# Patient Record
Sex: Male | Born: 1945 | Race: White | Hispanic: No | Marital: Married | Smoking: Never smoker
Health system: Southern US, Community
[De-identification: ages and names within clinical notes are randomized; demographics above are authoritative.]

## PROBLEM LIST (undated history)

## (undated) DIAGNOSIS — I2510 Atherosclerotic heart disease of native coronary artery without angina pectoris: Secondary | ICD-10-CM

## (undated) DIAGNOSIS — R0600 Dyspnea, unspecified: Secondary | ICD-10-CM

## (undated) DIAGNOSIS — M1990 Unspecified osteoarthritis, unspecified site: Secondary | ICD-10-CM

## (undated) DIAGNOSIS — I7121 Aneurysm of the ascending aorta, without rupture: Secondary | ICD-10-CM

## (undated) DIAGNOSIS — C569 Malignant neoplasm of unspecified ovary: Secondary | ICD-10-CM

## (undated) DIAGNOSIS — C801 Malignant (primary) neoplasm, unspecified: Secondary | ICD-10-CM

## (undated) DIAGNOSIS — C61 Malignant neoplasm of prostate: Secondary | ICD-10-CM

## (undated) DIAGNOSIS — Z87442 Personal history of urinary calculi: Secondary | ICD-10-CM

## (undated) DIAGNOSIS — Z85528 Personal history of other malignant neoplasm of kidney: Secondary | ICD-10-CM

## (undated) DIAGNOSIS — I712 Thoracic aortic aneurysm, without rupture: Secondary | ICD-10-CM

## (undated) DIAGNOSIS — E739 Lactose intolerance, unspecified: Secondary | ICD-10-CM

## (undated) DIAGNOSIS — C4490 Unspecified malignant neoplasm of skin, unspecified: Secondary | ICD-10-CM

## (undated) DIAGNOSIS — N200 Calculus of kidney: Secondary | ICD-10-CM

## (undated) DIAGNOSIS — I447 Left bundle-branch block, unspecified: Secondary | ICD-10-CM

## (undated) DIAGNOSIS — I10 Essential (primary) hypertension: Secondary | ICD-10-CM

## (undated) DIAGNOSIS — K219 Gastro-esophageal reflux disease without esophagitis: Secondary | ICD-10-CM

## (undated) DIAGNOSIS — R011 Cardiac murmur, unspecified: Secondary | ICD-10-CM

## (undated) HISTORY — DX: Atherosclerotic heart disease of native coronary artery without angina pectoris: I25.10

## (undated) HISTORY — PX: JOINT REPLACEMENT: SHX530

## (undated) HISTORY — PX: TRIGGER FINGER RELEASE: SHX641

## (undated) HISTORY — DX: Dyspnea, unspecified: R06.00

## (undated) HISTORY — PX: BACK SURGERY: SHX140

## (undated) HISTORY — PX: KNEE ARTHROSCOPY: SHX127

## (undated) HISTORY — DX: Thoracic aortic aneurysm, without rupture: I71.2

## (undated) HISTORY — PX: BUNIONECTOMY: SHX129

## (undated) HISTORY — DX: Aneurysm of the ascending aorta, without rupture: I71.21

## (undated) HISTORY — PX: LIPOMA EXCISION: SHX5283

## (undated) HISTORY — PX: APPENDECTOMY: SHX54

## (undated) HISTORY — DX: Lactose intolerance, unspecified: E73.9

## (undated) HISTORY — DX: Essential (primary) hypertension: I10

## (undated) HISTORY — DX: Left bundle-branch block, unspecified: I44.7

## (undated) HISTORY — PX: OTHER SURGICAL HISTORY: SHX169

## (undated) HISTORY — PX: ELBOW SURGERY: SHX618

## (undated) HISTORY — PX: TONSILLECTOMY: SUR1361

## (undated) HISTORY — DX: Calculus of kidney: N20.0

## (undated) HISTORY — PX: TOTAL KNEE ARTHROPLASTY: SHX125

## (undated) HISTORY — PX: BELPHAROPTOSIS REPAIR: SHX369

---

## 1998-07-04 ENCOUNTER — Ambulatory Visit (HOSPITAL_COMMUNITY): Admission: RE | Admit: 1998-07-04 | Discharge: 1998-07-04 | Payer: Self-pay | Admitting: Gastroenterology

## 1999-11-14 ENCOUNTER — Inpatient Hospital Stay (HOSPITAL_COMMUNITY): Admission: AD | Admit: 1999-11-14 | Discharge: 1999-11-15 | Payer: Self-pay | Admitting: Neurosurgery

## 2000-02-13 ENCOUNTER — Encounter: Admission: RE | Admit: 2000-02-13 | Discharge: 2000-02-13 | Payer: Self-pay | Admitting: Neurosurgery

## 2000-02-22 ENCOUNTER — Encounter: Payer: Self-pay | Admitting: Urology

## 2000-02-22 ENCOUNTER — Ambulatory Visit (HOSPITAL_COMMUNITY): Admission: RE | Admit: 2000-02-22 | Discharge: 2000-02-22 | Payer: Self-pay | Admitting: Urology

## 2000-02-29 ENCOUNTER — Inpatient Hospital Stay (HOSPITAL_COMMUNITY): Admission: RE | Admit: 2000-02-29 | Discharge: 2000-03-03 | Payer: Self-pay | Admitting: Neurosurgery

## 2000-03-15 ENCOUNTER — Encounter: Admission: RE | Admit: 2000-03-15 | Discharge: 2000-03-15 | Payer: Self-pay | Admitting: Neurosurgery

## 2000-05-24 ENCOUNTER — Encounter: Admission: RE | Admit: 2000-05-24 | Discharge: 2000-05-24 | Payer: Self-pay | Admitting: Neurosurgery

## 2000-08-23 ENCOUNTER — Encounter: Admission: RE | Admit: 2000-08-23 | Discharge: 2000-08-23 | Payer: Self-pay | Admitting: Neurosurgery

## 2001-03-19 HISTORY — PX: LUMBAR SPINE SURGERY: SHX701

## 2003-10-02 ENCOUNTER — Emergency Department (HOSPITAL_COMMUNITY): Admission: EM | Admit: 2003-10-02 | Discharge: 2003-10-02 | Payer: Self-pay | Admitting: Emergency Medicine

## 2003-11-11 ENCOUNTER — Ambulatory Visit (HOSPITAL_COMMUNITY): Admission: RE | Admit: 2003-11-11 | Discharge: 2003-11-11 | Payer: Self-pay | Admitting: Urology

## 2003-11-12 ENCOUNTER — Emergency Department (HOSPITAL_COMMUNITY): Admission: EM | Admit: 2003-11-12 | Discharge: 2003-11-12 | Payer: Self-pay | Admitting: Emergency Medicine

## 2004-03-16 ENCOUNTER — Ambulatory Visit (HOSPITAL_COMMUNITY): Admission: RE | Admit: 2004-03-16 | Discharge: 2004-03-16 | Payer: Self-pay | Admitting: Urology

## 2005-12-13 ENCOUNTER — Ambulatory Visit (HOSPITAL_COMMUNITY): Admission: RE | Admit: 2005-12-13 | Discharge: 2005-12-13 | Payer: Self-pay | Admitting: Urology

## 2006-04-29 ENCOUNTER — Inpatient Hospital Stay (HOSPITAL_COMMUNITY): Admission: RE | Admit: 2006-04-29 | Discharge: 2006-05-01 | Payer: Self-pay | Admitting: Orthopedic Surgery

## 2006-05-13 ENCOUNTER — Encounter: Admission: RE | Admit: 2006-05-13 | Discharge: 2006-05-13 | Payer: Self-pay | Admitting: Orthopedic Surgery

## 2006-09-02 ENCOUNTER — Inpatient Hospital Stay (HOSPITAL_COMMUNITY): Admission: RE | Admit: 2006-09-02 | Discharge: 2006-09-04 | Payer: Self-pay | Admitting: Orthopedic Surgery

## 2006-09-10 ENCOUNTER — Emergency Department (HOSPITAL_COMMUNITY): Admission: EM | Admit: 2006-09-10 | Discharge: 2006-09-10 | Payer: Self-pay | Admitting: Emergency Medicine

## 2007-06-05 ENCOUNTER — Ambulatory Visit (HOSPITAL_COMMUNITY): Admission: RE | Admit: 2007-06-05 | Discharge: 2007-06-05 | Payer: Self-pay | Admitting: Urology

## 2007-11-13 ENCOUNTER — Ambulatory Visit (HOSPITAL_COMMUNITY): Admission: RE | Admit: 2007-11-13 | Discharge: 2007-11-13 | Payer: Self-pay | Admitting: Urology

## 2008-03-03 ENCOUNTER — Inpatient Hospital Stay (HOSPITAL_COMMUNITY): Admission: RE | Admit: 2008-03-03 | Discharge: 2008-03-06 | Payer: Self-pay | Admitting: Neurosurgery

## 2009-04-10 ENCOUNTER — Emergency Department (HOSPITAL_COMMUNITY): Admission: EM | Admit: 2009-04-10 | Discharge: 2009-04-10 | Payer: Self-pay | Admitting: Emergency Medicine

## 2009-04-20 ENCOUNTER — Ambulatory Visit (HOSPITAL_COMMUNITY): Admission: RE | Admit: 2009-04-20 | Discharge: 2009-04-20 | Payer: Self-pay | Admitting: Urology

## 2009-09-28 ENCOUNTER — Encounter: Payer: Self-pay | Admitting: Pulmonary Disease

## 2009-11-13 ENCOUNTER — Emergency Department (HOSPITAL_BASED_OUTPATIENT_CLINIC_OR_DEPARTMENT_OTHER): Admission: EM | Admit: 2009-11-13 | Discharge: 2009-11-13 | Payer: Self-pay | Admitting: Emergency Medicine

## 2009-12-16 ENCOUNTER — Ambulatory Visit: Payer: Self-pay | Admitting: Cardiology

## 2009-12-16 ENCOUNTER — Encounter: Admission: RE | Admit: 2009-12-16 | Discharge: 2009-12-16 | Payer: Self-pay | Admitting: Cardiology

## 2009-12-16 ENCOUNTER — Encounter: Payer: Self-pay | Admitting: Pulmonary Disease

## 2009-12-19 ENCOUNTER — Ambulatory Visit: Payer: Self-pay | Admitting: Cardiology

## 2009-12-22 ENCOUNTER — Inpatient Hospital Stay (HOSPITAL_BASED_OUTPATIENT_CLINIC_OR_DEPARTMENT_OTHER): Admission: RE | Admit: 2009-12-22 | Discharge: 2009-12-22 | Payer: Self-pay | Admitting: Cardiology

## 2009-12-22 ENCOUNTER — Ambulatory Visit: Payer: Self-pay | Admitting: Cardiology

## 2009-12-22 HISTORY — PX: CARDIAC CATHETERIZATION: SHX172

## 2009-12-28 ENCOUNTER — Ambulatory Visit: Payer: Self-pay | Admitting: Cardiovascular Disease

## 2009-12-28 ENCOUNTER — Encounter: Payer: Self-pay | Admitting: Pulmonary Disease

## 2010-01-03 ENCOUNTER — Encounter: Payer: Self-pay | Admitting: Pulmonary Disease

## 2010-01-03 ENCOUNTER — Ambulatory Visit: Payer: Self-pay | Admitting: Cardiology

## 2010-01-06 ENCOUNTER — Ambulatory Visit: Payer: Self-pay | Admitting: Cardiovascular Disease

## 2010-01-06 ENCOUNTER — Ambulatory Visit (HOSPITAL_COMMUNITY): Admission: RE | Admit: 2010-01-06 | Discharge: 2010-01-06 | Payer: Self-pay | Admitting: Cardiology

## 2010-01-06 ENCOUNTER — Encounter: Payer: Self-pay | Admitting: Cardiology

## 2010-01-06 ENCOUNTER — Ambulatory Visit: Payer: Self-pay

## 2010-01-06 ENCOUNTER — Encounter: Payer: Self-pay | Admitting: Pulmonary Disease

## 2010-01-31 DIAGNOSIS — I712 Thoracic aortic aneurysm, without rupture, unspecified: Secondary | ICD-10-CM | POA: Insufficient documentation

## 2010-01-31 DIAGNOSIS — E739 Lactose intolerance, unspecified: Secondary | ICD-10-CM | POA: Insufficient documentation

## 2010-01-31 DIAGNOSIS — I10 Essential (primary) hypertension: Secondary | ICD-10-CM | POA: Insufficient documentation

## 2010-01-31 DIAGNOSIS — K219 Gastro-esophageal reflux disease without esophagitis: Secondary | ICD-10-CM | POA: Insufficient documentation

## 2010-01-31 DIAGNOSIS — I447 Left bundle-branch block, unspecified: Secondary | ICD-10-CM | POA: Insufficient documentation

## 2010-02-01 ENCOUNTER — Ambulatory Visit: Payer: Self-pay | Admitting: Pulmonary Disease

## 2010-02-01 DIAGNOSIS — R0602 Shortness of breath: Secondary | ICD-10-CM | POA: Insufficient documentation

## 2010-02-14 ENCOUNTER — Ambulatory Visit: Payer: Self-pay | Admitting: Cardiology

## 2010-04-18 NOTE — Assessment & Plan Note (Signed)
Summary: sob/jd   Visit Type:  Initial Consult Copy to:  Dr. Peter Swaziland Primary Provider/Referring Provider:  Jarold Motto  CC:  Pulmonary consult for shortness of breath.  History of Present Illness: 65/M, never smoker for evaluation of dyspnea. He takes voltaran for joint pains & recently took tramadol. Developed sudden onset dyspnea while hitting balls on the range, lasting a few minutes, Was associated with chest discomfort & lightheadedness. Seemed to improve with resting. Not realted to other walking or sleeping. Blood pressure has been high but much better controlled now on amlodepin - olmesartan combination. He has a chronic LBBB & 4.5 cm ascending aortic aneurysm noted on CT chest. h/o recent travel to [place] & [place]. CT angio neg, BUN/ cr 31/1.6 Nuclear stress test nml, nml LVEF Cath - non obstructive CAD. No cough, wheezing or heartburn. He wonder sif symptoms related to meds - tramadol, toprol   Preventive Screening-Counseling & Management  Alcohol-Tobacco     Alcohol drinks/day: 0     Smoking Status: never  Current Medications (verified): 1)  Toprol Xl 50 Mg Xr24h-Tab (Metoprolol Succinate) .... 1/2 By Mouth Daily 2)  Azor 5-20 Mg Tabs (Amlodipine-Olmesartan) .Marland Kitchen.. 1 By Mouth Daily 3)  Advil 200 Mg Tabs (Ibuprofen) .... As Needed  Allergies (verified): 1)  ! 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  Smoking Status:  never Alcohol drinks/day:  0  Review of Systems       The patient complains of shortness of breath with activity and chest pain.  The patient denies shortness of breath at rest, productive cough, non-productive cough, coughing up blood, irregular heartbeats, acid heartburn, indigestion, loss of appetite, weight change, abdominal pain, difficulty swallowing, sore throat, tooth/dental problems, headaches, nasal congestion/difficulty breathing through nose, sneezing, itching, ear ache, anxiety, depression, hand/feet swelling, joint stiffness or pain, rash, change in color of mucus, and fever.    Vital Signs:  Patient profile:   65 year old male Height:      71.5 inches (181.61 cm) Weight:      204 pounds (92.73 kg) BMI:     28.16 O2 Sat:      97 % on Room air Temp:     98.2 degrees F (36.78 degrees C) oral Pulse rate:   64 / minute BP sitting:   126 / 82  (left arm) Cuff size:   regular  Vitals Entered By: Michel Bickers CMA (February 01, 2010 11:44 AM)  O2 Sat at Rest %:  97 O2 Flow:  Room air CC: Pulmonary consult for shortness of breath Is Patient Diabetic? No Comments Medications reviewed with patient Michel Bickers CMA  February 01, 2010 11:45 AM   Physical Exam  Additional Exam:  Gen. Pleasant, well-nourished, in no distress, normal affect ENT - no lesions, no post nasal drip Neck: No JVD, no thyromegaly, no carotid bruits Lungs: no use of accessory muscles, no dullness to percussion, clear without rales or rhonchi  Cardiovascular: Rhythm regular, heart sounds  normal, no murmurs or gallops, no peripheral edema Abdomen:  soft and non-tender, no hepatosplenomegaly, BS normal. Musculoskeletal: No deformities, no cyanosis or clubbing Neuro:  alert, non focal     Impression & Recommendations:  Problem # 1:  DYSPNEA (ICD-786.05)  He is much improved over last 2 weeks. Cardiac evalaution has  not shown clear cause. Doubt obstructive airways in this never smoker Ct angio neg for PE NO evidence of fibrosis on CT Doubt pulmonary function needed here. DOubt direct corelation with meds ? drug interaction He will call back for issues.  Orders: Consultation Level III (16109)  Medications Added to Medication List This Visit: 1)  Toprol Xl 50 Mg Xr24h-tab (Metoprolol succinate) .... 1/2 by mouth daily 2)  Azor 5-20 Mg Tabs (Amlodipine-olmesartan) .Marland Kitchen.. 1 by mouth daily 3)  Advil 200 Mg Tabs (Ibuprofen) .... As needed  Patient Instructions: 1)  Copy sent to: Dr Swaziland, Dr Jarold Motto 2)  Please schedule a follow-up appointment as needed. 3)  Your symptoms may be related to tramadol or blood pressure - doubt you have a primary lung problem

## 2010-04-18 NOTE — Letter (Signed)
Summary: [facility]   Imported By: Lester Ramah 02/08/2010 09:06:31  _____________________________________________________________________  External Attachment:    Type:   Image     Comment:   External Document

## 2010-04-21 NOTE — Letter (Signed)
Summary: [facility]   Imported By: Lester Conway 02/08/2010 09:04:50  _____________________________________________________________________  External Attachment:    Type:   Image     Comment:   External Document

## 2010-06-02 LAB — BASIC METABOLIC PANEL
BUN: 22 mg/dL (ref 6–23)
CO2: 21 mEq/L (ref 19–32)
Calcium: 9 mg/dL (ref 8.4–10.5)
Chloride: 109 mEq/L (ref 96–112)
Creatinine, Ser: 1.2 mg/dL (ref 0.4–1.5)
GFR calc Af Amer: >60 mL/min (ref >60)
GFR calc non Af Amer: >60 mL/min (ref >60)
Glucose, Bld: 119 mg/dL — ABNORMAL HIGH (ref 70–99)
Potassium: 4 mEq/L (ref 3.5–5.1)
Sodium: 140 mEq/L (ref 135–145)

## 2010-06-02 LAB — CBC
HCT: 46.3 % (ref 39.0–52.0)
Hemoglobin: 16.1 g/dL (ref 13.0–17.0)
MCH: 33.3 pg (ref 26.0–34.0)
MCHC: 34.8 g/dL (ref 30.0–36.0)
MCV: 93.5 fL (ref 78.0–100.0)
Platelets: 206 10*3/uL (ref 150–400)
RBC: 5.04 MIL/uL (ref 4.22–5.81)
RDW: 12.2 % (ref 11.5–15.5)
WBC: 5.1 10*3/uL (ref 4.0–10.5)

## 2010-06-02 LAB — DIFFERENTIAL
Basophils Absolute: 0 10*3/uL (ref 0.0–0.1)
Basophils Relative: 1 % (ref 0–1)
Eosinophils Absolute: 0.2 10*3/uL (ref 0.0–0.7)
Eosinophils Relative: 4 % (ref 0–5)
Lymphocytes Relative: 40 % (ref 12–46)
Lymphs Abs: 2 10*3/uL (ref 0.7–4.0)
Monocytes Absolute: 0.5 10*3/uL (ref 0.1–1.0)
Monocytes Relative: 10 % (ref 3–12)
Neutro Abs: 2.4 10*3/uL (ref 1.7–7.7)
Neutrophils Relative %: 46 % (ref 43–77)

## 2010-06-04 LAB — BASIC METABOLIC PANEL
BUN: 17 mg/dL (ref 6–23)
CO2: 24 mEq/L (ref 19–32)
Calcium: 9.3 mg/dL (ref 8.4–10.5)
Chloride: 108 mEq/L (ref 96–112)
Creatinine, Ser: 1.29 mg/dL (ref 0.4–1.5)
GFR calc Af Amer: >60 mL/min (ref >60)
GFR calc non Af Amer: 56 mL/min — ABNORMAL LOW (ref >60)
Glucose, Bld: 103 mg/dL — ABNORMAL HIGH (ref 70–99)
Potassium: 4.2 mEq/L (ref 3.5–5.1)
Sodium: 140 mEq/L (ref 135–145)

## 2010-06-04 LAB — URINE CULTURE: Colony Count: 4000

## 2010-06-04 LAB — CBC
HCT: 47 % (ref 39.0–52.0)
Hemoglobin: 16 g/dL (ref 13.0–17.0)
MCHC: 34.1 g/dL (ref 30.0–36.0)
MCV: 94.6 fL (ref 78.0–100.0)
Platelets: 197 10*3/uL (ref 150–400)
RBC: 4.97 MIL/uL (ref 4.22–5.81)
RDW: 12.4 % (ref 11.5–15.5)
WBC: 5.9 10*3/uL (ref 4.0–10.5)

## 2010-06-04 LAB — URINALYSIS, ROUTINE W REFLEX MICROSCOPIC
Bilirubin Urine: NEGATIVE
Glucose, UA: NEGATIVE mg/dL
Ketones, ur: NEGATIVE mg/dL
Nitrite: NEGATIVE
Protein, ur: 100 mg/dL — AB
Specific Gravity, Urine: 1.025 (ref 1.005–1.030)
Urobilinogen, UA: 0.2 mg/dL (ref 0.0–1.0)
pH: 5.5 (ref 5.0–8.0)

## 2010-06-04 LAB — DIFFERENTIAL
Basophils Absolute: 0 10*3/uL (ref 0.0–0.1)
Basophils Relative: 0 % (ref 0–1)
Eosinophils Absolute: 0.1 10*3/uL (ref 0.0–0.7)
Eosinophils Relative: 2 % (ref 0–5)
Lymphocytes Relative: 23 % (ref 12–46)
Lymphs Abs: 1.3 10*3/uL (ref 0.7–4.0)
Monocytes Absolute: 0.5 10*3/uL (ref 0.1–1.0)
Monocytes Relative: 8 % (ref 3–12)
Neutro Abs: 3.9 10*3/uL (ref 1.7–7.7)
Neutrophils Relative %: 67 % (ref 43–77)

## 2010-06-04 LAB — URINE MICROSCOPIC-ADD ON

## 2010-07-03 ENCOUNTER — Other Ambulatory Visit: Payer: Self-pay | Admitting: *Deleted

## 2010-07-03 DIAGNOSIS — I10 Essential (primary) hypertension: Secondary | ICD-10-CM

## 2010-07-03 MED ORDER — HYDROCHLOROTHIAZIDE 25 MG PO TABS: 25.0000 mg | ORAL_TABLET | Freq: Every day | ORAL | Status: DC

## 2010-07-03 NOTE — Telephone Encounter (Signed)
escribe medication per fax request  

## 2010-07-31 ENCOUNTER — Other Ambulatory Visit: Payer: Self-pay | Admitting: *Deleted

## 2010-07-31 DIAGNOSIS — I10 Essential (primary) hypertension: Secondary | ICD-10-CM

## 2010-07-31 MED ORDER — HYDROCHLOROTHIAZIDE 25 MG PO TABS: 25.0000 mg | ORAL_TABLET | Freq: Every day | ORAL | Status: DC

## 2010-07-31 NOTE — Telephone Encounter (Signed)
escribe medication per fax request  

## 2010-08-01 NOTE — Op Note (Signed)
NAME:  Corey [name] NO.:  [id number]   MEDICAL RECORD NO.:  [id number]          PATIENT TYPE:  INP   LOCATION:  5019                         FACILITY:  [facility]   PHYSICIAN:  Elana Alm. Thurston [name], M.D. DATE OF BIRTH:  1945/03/30   DATE OF PROCEDURE:  09/02/2006  DATE OF DISCHARGE:                               OPERATIVE REPORT   PREOPERATIVE DIAGNOSIS:  Right knee degenerative joint disease.   POSTOPERATIVE DIAGNOSIS:  Right knee degenerative joint disease.   PROCEDURE:  Right total knee replacement using DePuy cemented total knee  system with #4 cemented femur, #4 cemented tibia with a 15-mm  polyethylene rotating-platform tibial spacer, and a 38-mm polyethylene  cemented patella.   SURGEON:  Elana Alm. Thurston [name], M.D.   ASSISTANT:  Julien Girt, P.A.   ANESTHESIA:  General.   OPERATIVE TIME:  1 hour 20 minutes.   COMPLICATIONS:  None.   DESCRIPTION OF PROCEDURE:  Corey [name] was brought to the operating  room on September 02, 2006, after a femoral nerve block was placed in the  holding room by Anesthesia.  He was placed on the operating table in  supine position.  His right knee was examined under anesthesia.  He had  a range of motion from -5 to 125 degrees, with mild varus deformity, and  knee stable to ligamentous exam with normal patellar tracking.  He had a  Foley catheter placed under sterile conditions.  He received Ancef 2 g  IV preoperatively for prophylaxis.  His right leg was then prepped using  sterile DuraPrep and draped using sterile technique.  The leg was  exsanguinated and a thigh tourniquet elevated 365 mm.  Initially,  through a 15-cm longitudinal incision based over the patella, initial  exposure was made.  The underlying subcutaneous tissues were incised in  line with the skin incision.  A median arthrotomy was performed,  revealing an excessive amount of normal-appearing joint fluid.  The  articular surfaces were inspected.  He had  grade 4 changes medially,  grade 3 changes laterally and grade 3 and 4 changes in the  patellofemoral joint.  Osteophytes were removed from the femoral  condyles and tibial plateau.  The medial and lateral meniscal remnants  were removed, as well as the anterior cruciate ligament.  An  intramedullary drill was then drilled up the femoral canal for placement  of the distal femoral cutting jig, which was placed in the appropriate  amount of rotation, and the distal 11-mm cut was made.  The distal femur  was then incised.  A #4 was found to be the appropriate size.  A #4  cutting jig was placed, and then these cuts were made.  The proximal  tibia was then exposed.  The tibial spines were removed with an  oscillating saw.  An intramedullary drill was drilled down the tibial  canal for placement of the proximal tibial cutting jig, which was placed  in the appropriate amount of rotation.  The proximal 6-mm cut was made,  based off the medial or lower side.  Spacer blocks were then placed in  flexion and extension, and 15-mm blocks gave excellent balancing,  excellent stability and excellent correction of his flexion and varus  deformity.  After this was done, the #4 tibial baseplate trial was  placed on the cut tibial surface with an excellent fit, and the keel cut  was made.  A #4 PCL box cutter was placed on the distal femur, and then  these cuts were made.  At this point, the #4 femoral trial was placed,  and with the #4 tibial baseplate trial and a 15-mm poly RP tibial  spacer, the knee was reduced and taken through a range of motion from 0  to 125 degrees with excellent stability and excellent correction of his  flexion and varus deformities.  The patella was incised.  A resurfacing  10-mm cut was made and 3 locking holes placed for a 38-mm patella.  The  patellar trial was placed.  Patellofemoral tracking was evaluated and  this was found to be normal.  At this point, it was felt that all  the  trial components were of excellent size, fit and stability.  They were  then removed.  The knee was then jet lavaged and irrigated with 3 L of  saline.  The proximal tibia was then exposed.  A #4 tibial baseplate  with cement backing was hammered in position with an excellent fit, with  excess cement being removed from around the edges.  A #4 femoral  component with cement backing was hammered in position, also with an  excellent fit, with excess cement being removed from the edges.  The 15-  mm polyethylene RP tibial spacer was placed on the tibial baseplate and  the knee reduced and taken through a range of motion from 0 to 125  degrees, with excellent stability and excellent correction of his  flexion and varus deformities.  A 38-mm polyethylene cement-backed  patella was then placed in its position and held there with a clamp.  After the cement hardened, patellofemoral tracking was again evaluated  and found to be normal.  At this point, it was felt that all the  components were of excellent size, fit and stability.  The wound was  further irrigated with saline.  The tourniquet was released.  Hemostasis  was obtained with cautery.  The arthrotomy was then closed with #1  Ethibond suture over 2 medium Hemovac drains.  The subcutaneous tissues  were closed with 0 and 2-0 Vicryl.  The subcuticular layer was closed  with 4-0 Monocryl.  Sterile dressings and a long leg splint were  applied.  The patient was then awakened, extubated and taken to the  recovery room in stable condition.  Needle and sponge counts were  correct x2 at the end of the case.  Neurovascular status was normal.      Corey [name], M.D.  Electronically Signed     RAW/MEDQ  D:  09/02/2006  T:  09/02/2006  Job:  [id number]

## 2010-08-01 NOTE — Op Note (Signed)
NAME:  Corey [name], Corey [name] NO.:  [id number]   MEDICAL RECORD NO.:  [id number]          PATIENT TYPE:  INP   LOCATION:  3040                         FACILITY:  [facility]   PHYSICIAN:  Cristi Loron, M.D.DATE OF BIRTH:  [birth year]   DATE OF PROCEDURE:  03/03/2008  DATE OF DISCHARGE:                               OPERATIVE REPORT   BRIEF HISTORY:  The patient is a 65 year old white male who I performed  a L5-S1 decompression and fusion many years ago.  The patient has done  well until recently where he has developed increasing back and hip pain  consistent with neurogenic claudication.  I discussed the situation with  the patient who was worked up with a lumbar MRI, which demonstrated he  had developed severe degeneration as well as foraminal stenosis at L4-5.  I discussed the various treatment options with the patient including  surgery.  He has weighed the risks, benefits, and alternatives of  procedure and decided to proceed with an exploration of his L5-S1 fusion  with L4-5 decompression, instrumentation, and fusion.   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   PROCEDURES:  Exploration of L5-S1 fusion; bilateral L4 laminotomies,  foraminotomies to decompress the bilateral L4 as well as L5 nerve roots;  L4-5 posterior lumbar interbody fusion with local autograft bone and  Actifuse/Vitoss bone graft extender; insertion of bilateral L4-5  interbody prosthesis (Capstone PEEK interbody prosthesis); L4 through S1  posterior segmental fixation with SD-90 titanium pedicle screws and  rods; posterolateral arthrodesis L4-5 with local morselized autograft  bone, Actifuse, and Vitoss bone graft extenders.   SURGEON:  Cristi Loron, MD   ASSISTANT:  Stefani Dama, MD   ANESTHESIA:  General endotracheal.   ESTIMATED BLOOD LOSS:  500 mL.   SPECIMENS:  None.   DRAINS:  None.   COMPLICATIONS:  Small unintentional durotomy.   DESCRIPTION OF PROCEDURE:  The patient was brought to the operating room  by the anesthesia team.  General endotracheal anesthesia was induced.  The patient was carefully turned to the prone position onto the Motley  frame.  His lumbosacral region was then shaved with clippers and  prepared with Betadine scrub and Betadine solution and sterile drapes  were applied.  I then injected the area to be incised with Marcaine with  epinephrine solution.  I then used a scalpel to make an incision in the  patient's preexisting surgical scar and extended it somewhat in cephalad  direction.  I used electrocautery to perform a bilateral subperiosteal  dissection, exposing the spinous processes, lamina of L3, L4, and L5 in  the upper sacrum.  We exposed the old pedicle screws and rods at L5-S1.  I then removed the caps from the old SD-90 pedicle screws.  I then  attempted to independently move the pedicle screws.  I explored the  fusion.  I did not see any motion and it appeared that the L5-S1 was  well fused.  Having completed the exploration of the arthrodesis, we now turned our  attention to the decompression.  I should mention we inserted the Versa-  Trac retractor for exposure.  I then used a high-speed drill to perform  bilateral L4 laminotomies.  I widened the laminotomies with Kerrison  punch.  Because of the patient's severe facet arthropathy and lateral  recess/foraminal stenosis, a greater decompression was required than  what would have simply been required to do the interbody fusion.  I  performed a medial facetectomy and wide foraminotomies about the left L4  and L5 nerve roots using the Kerrison punches.  In the process of doing  this, we did create a very small durotomy.  There was no CSF leakage,  but we could see a bit of  arachnoid protruding.  It was so small that it  did not require stitch.  We simply at the end of the case put some  Tisseel over it.   Having completed decompression L4-5, we now turned our attention to  arthrodesis.  We incised the L4-5 intervertebral disk bilaterally with a  #15 blade scalpel.  The disk space was quite collapsed and spondylotic.  We used the Epstein curettes and the pituitary forceps to perform a  partial intervertebral diskectomy.  We then prepared the vertebral  endplates for fusion by using the curettes to remove the soft tissues  and we used trial spacers and determined to use a 18 x 26 mm Capstone  PEEK interbody prosthesis bilaterally.  We then prefilled this  prosthesis with a combination of local autograft bone we obtained during  decompression as well as Actifuse and Vitoss bone graft extender.  We  then inserted the prosthesis into the interspace, of course, after  retracting the neural structures out of the harms way.  We also filled  in laterally and medial to the interbody prosthesis with local  autograft, Actifuse, and Vitoss bone graft extender.  This completed the  posterior lumbar interbody fusion.   We now turned our attention to the instrumentation.  Under fluoroscopic  guidance, we cannulated the bilateral L4 pedicles with a ball probe.  We  then tapped the pedicle with a 6.25-mm tap, then probed inside the  tapped pedicle with a ball probe to rule out cortical breeches.  I then  inserted 7.75 x 50 mm pedicle screws bilaterally into the L4 pedicles  under fluoroscopic guidance.  We palpated along the medial aspect of the  pedicles around cortical breeches and noted there were none.  We then  connected unilateral pedicle screws with a 60-mm rod which we contoured  to appropriate lordosis.  We then secured the rod in place by tightening  the caps bilaterally.  This completed the instrumentation from L4  through S1.   We now turned our attention to  posterolateral arthrodesis.  We used a  high-speed drill to decorticate the remainder of the L4-5 facet, pars  region, and transverse processes.  We then laid a combination of local  autograft bone, Vitoss, and Actifuse bone graft extenders over these  decorticated posterior structures bilaterally completing the  posterolateral arthrodesis.   We then obtained hemostasis using bipolar electrocautery.  I should  mention that we encountered a good bed of epidural bleeding, which we  controlled with bipolar electrocautery and Gelfoam.  We then palpated on  the ventral surface of thecal sac along the exit route of the L4 and L5  nerve roots bilaterally in order to well decompress.  We  irrigated the  wound out with bacitracin solution, removed the retractors, and then  reapproximated the patient's thoracolumbar fascia with interrupted #1  Vicryl suture, subcutaneous tissue with interrupted 2-0 Vicryl suture,  and the skin with Steri-Strips and Benzoin.  The wound was then coated  with bacitracin ointment.  A sterile dressing was  applied.  The drapes were removed, and the patient was subsequently  returned to supine position where he was extubated by the anesthesia  team and transported to the postanesthesia care unit in stable  condition.  All sponge, instrument, and needle counts were correct at  the end of this case.      Cristi Loron, M.D.  Electronically Signed     JDJ/MEDQ  D:  03/03/2008  T:  03/04/2008  Job:  [id number]

## 2010-08-04 NOTE — Discharge Summary (Signed)
[place]. [facility]  Patient:    Corey [name], Corey [name]                      MRN: [id number] Adm. Date:  [id number] Disc. Date: [id number] Attending:  Tressie Stalker D                           Discharge Summary  For details of this admission, please refer to typed history and physical.  BRIEF HISTORY:  The patient is a 65 year old white male who has had a long-term diagnosis of spondylolisthesis.  He began complaining of significant left leg pain and numbness more recently.  He failed medical management and was worked up with a lumbar MRI which demonstrated a grade I/II spondylolisthesis at L5-S1 with lateral recess stenosis on the left at L5-S1. The patients findings on physical exam was consistent with left L5 radiculopathy.  I discussed various treatments optionswith him including steroid injections and surgery.  I discussed a laminotomy and foraminotomy versus that operation plus a spinal fusion.  The patient elected to have the former surgery done after weighing the risks, benefits, and alternatives to surgery.  PAST MEDICAL HISTORY, PASTSURGICAL HISTORY, MEDICATIONS PRIOR TO ADMISSION, DRUG ALLERGIES, FAMILYMEDICAL HISTORY, SOCIAL HISTORY, REVIEW OF SYSTEMS, ADMISSION PHYSICAL EXAMINATION, ASSESSMENT/PLAN, ETC.:  Please refer to typed history and physical.  HOSPITAL COURSE:  I performed a left L5 hemilaminectomy and a left L4 laminotomy and foraminotomy on the patient on November 13, 1999.  The surgery went well without complications.  (For full details of this operation, please refer to typed operative note.)  POSTOPERATIVE COURSE:  The patients postoperative course was initially remarkable of complaints of significant left leg pain to the point where he was requiring intravenous analgesics.  This resolved by November 15, 1999.  At that time, his leg felt better, he was afebrile, vital signs stable.  He was healingwell without signs of infection.  He had  normal motor strength.  I, therefore, discharged him home on November 15, 1999.  DISCHARGE INSTRUCTIONS:  The patient was given written discharge instructions.  FOLLOW UP:  He was instructed to follow up with me in three weeks.  DISCHARGE MEDICATIONS: 1. Percocet 5, #60, 1 to 2 p.o. q.4h. p.r.n. pain. 2. Valium 5 mg, #40, 1 p.o. q.6h. p.r.n. for muscle spasms, 1 refill.  FINAL DIAGNOSES:  L5-S1 spondylolithesis.  PROCEDURE PERFORMED:  Left L5 hemilaminectomy, left L4 laminotomy and foraminotomy. DD:  11/23/99 TD:  11/24/99 Job: [id number]

## 2010-08-04 NOTE — H&P (Signed)
[place]. [facility]  Patient:    Corey [name], Corey [name]                      MRN: [id number] Adm. Date:  [id number] Attending:  Tressie Stalker D                         History and Physical  CHIEF COMPLAINT:  Back pain, bilateral leg pain.  HISTORY OF PRESENT ILLNESS:  The patient is a 65 year old white male who I performed a left L5 hemilaminotomy and L4 laminotomy for treatment of spinal stenosis and spondylolithesis.  At this point, he was having predominately leg pain and actually did quite well from this regard; i.e., his left leg pain resolved.  Then he began to have some more back pain and particularly more pain in his right leg.  He failed medical management and I therefore discussed treatment options with him, including a L5-S1 Gill procedure with a posterior lumbar interbody fusion and placement of pedicle screws and rods.  I discussed the surgery extensively with him.  The patient weighed the risks, benefits, alternatives of surgery and decided to proceed with the operation after he failed medical management.  PAST MEDICAL HISTORY:  Positive for remote history of appendicitis, knee osteoarthritis.  He has a known history of nephrolithiasis and in fact had a bout of kidney stones last week.  MEDICATIONS: 1. Prilosec one p.o. q.d. 2. P.r.n. analgesics.  FAMILY MEDICAL HISTORY:  Patients mother is age 29 in good health.  Patients father died age 46 secondary to pancreatic cancer.  He had a brother who died age 26 secondary to a myocardial infarction.  SOCIAL HISTORY:  The patient is married, he has four children, lives in [place].  He is a Scientist, research (medical).  He denies tobacco, ethanol, or drug use.  REVIEW OF SYSTEMS:  Negative except as above.  PHYSICAL EXAMINATION:  GENERAL:  A pleasant, well-nourished, well-developed 65 year old white male. Height 5 feet 11 inches, weight 180 pounds.  HEENT:  Normocephalic, atraumatic.  Pupils  are equal, round and reactive to light.  NECK:  Normal.  THORAX:  Symmetric.  LUNGS:  Clear to auscultation.  HEART:  Regular rate and rhythm.  ABDOMEN:  Soft, nontender.  NEUROLOGIC:  The patient is alert and oriented x 3.  Cranial nerves 2-12 are grossly intact bilaterally.  Motor strength is grossly normal in all tested muscle groups of all four extremities.  Cerebellar exam is normal.  Sensory exam is normal.  Deep tendon reflexes are 2/4 in bilateral biceps, triceps, brachioradialis, quadriceps, gastrocnemius.  IMAGING STUDIES:  The patient has a lumbar MRI performed at [facility] on September 08, 1999 which demonstrates a L5-S1 grade 2 lytic spondylolisthesis with bilateral L5 spondylolysis.  ASSESSMENT AND PLAN:  L5-S1 grade 2 spondylolisthesis, bilateral L5 spondylolysis, spinal stenosis, lumbago, lumbar radiculopathy.  I have discussed the situation with the patient.  He has failed medical management and I have discussed surgical options with him including a lumbar decompression and fusion.  I have described an L5 Gill procedure with L5-S1 ______ placement with pedicle screws and rods and allograft bone, as well as a posterolateral fusion and the patient weighed the risks, benefits, alternatives of surgery and decided to proceed with the operation. DD:  02/29/00 TD:  03/01/00 Job: [id number]

## 2010-08-04 NOTE — H&P (Signed)
[place]. [facility]  Patient:    Corey [name], Corey [name]                      MRN: [id number] Adm. Date:  [id number] Attending:  Tressie Stalker D                         History and Physical  CHIEF COMPLAINT: Left leg pain.  HISTORY OF PRESENT ILLNESS: The patient is a 65 year old white male diagnosed with an L5-S1 spondylolisthesis many years ago.  He began having trouble a few months ago with tingling in his left leg that progressed to left leg pain. The patient has been seen by a chiropractor and his primary physician, and Dr. Farris Has.  He has been treated with medications, therapy, rest, etc., and has not improved.  He was referred to me.  The patient complains of left leg pain and tingling which seems to worsen if he ambulates.  He occasionally gets some right leg pain.  PAST MEDICAL HISTORY:  1. Remote history of appendicitis.  2. Knee osteoarthritis.  CURRENT MEDICATIONS:  1. Prilosec.  2. Vioxx.  ALLERGIES: No known drug allergies.  FAMILY HISTORY: The patients mother is age 69 and in good health.  The patients father died at age 44 secondary to pancreatic cancer.  A brother died at age 33 secondary to myocardial infarction.  SOCIAL HISTORY: The patient is married and has four children.  He lives in [place], [place] and is a Transport planner for [employer].  He denies tobacco, ethanol, and drug use.  REVIEW OF SYSTEMS: Negative except as above.  PHYSICAL EXAMINATION:  GENERAL: Pleasant, well-developed, well-nourished 65 year old white male in no apparent distress.  VITAL SIGNS: Height 5 feet 11 inches.  Weight 180 pounds.  HEENT: Head normocephalic, atraumatic.  PERRL.  EOMI.  Sclerae white. Conjunctivae pink.  Oropharynx benign.  Uvula midline.  He was wearing contact lenses.  NECK: Supple with no abnormalities.  Thorax symmetric.  LUNGS: Clear to auscultation.  HEART: Regular rate and rhythm.  ABDOMEN: Soft, nontender.   Well-healed laparotomy scar.  BACK: Benign.  No point tenderness or deformities except for a small palpable step-off.  Straight leg raise testing and Pearlean Brownie testing is negative bilaterally.  EXTREMITIES: Well-healed left foot incision without signs of infection.  NEUROLOGIC: The patient is alert and oriented x 3.  Cranial nerves 2-12 grossly intact bilaterally.  Vision grossly normal bilaterally with corrective lenses and hearing grossly normal bilaterally.  Motor strength is 5/5 at bilateral deltoids, biceps, triceps, hand grips, wrist extensors, interosseous psoas, quadriceps, gastrocnemius, extensor hallucis longus.  Cerebellar examination is intact with rapid alternating movements of the upper extremities bilaterally.  Sensory examination is normal to light touch and pinprick sensation in all tested dermatomes bilaterally.  Deep tendon reflexes are 2/4 in the bilateral biceps, triceps, brachial radialis, quadriceps, gastrocnemius.  IMAGING STUDIES: The patient had a lumbar MRI performed at [facility] on September 08, 1999 which demonstrates a lytic spondylolisthesis at L5-S1 and on axial images there is bilateral L5 spondylosis with grade 1/2 spondylolisthesis at L5-S1 with resulting lateral recess stenosis, and he has a pseudo disk herniation but I do not see any focal disk rupture.  ASSESSMENT/PLAN: Bilateral L5-S1 spondylosis, grade 1/2 spondylolisthesis of L5-S1, spinal stenosis and lumbar spondylosis with lumbar radiculopathy.  I have discussed the situation with the patient and reviewed his MRI scan and plain x-rays with him and pointed out  the abnormalities.  The patient has failed medical management and I discussed the surgical options with him including L5-S1 Gills procedure with posterior lumbar interbody fusion using pedicle screws and bone grafts versus a left L5 laminotomy versus hemilaminectomy for nerve root compression without fusion.  The  patient does have significant chronic back pain.  I discussed the surgical procedure and its risks extensively and the patient weighed the risks and benefits and alternatives to the various surgeries and decided to proceed with left L5 laminotomy versus laminectomy for nerve root decompression. DD:  11/13/99 TD:  11/13/99 Job: [id number]

## 2010-08-04 NOTE — Discharge Summary (Signed)
NAME:  Corey [name], Corey [name] NO.:  [id number]   MEDICAL RECORD NO.:  [id number]          PATIENT TYPE:  INP   LOCATION:  3040                         FACILITY:  [facility]   PHYSICIAN:  Cristi Loron, M.D.DATE OF BIRTH:  Dec 28, 1945   DATE OF ADMISSION:  03/03/2008  DATE OF DISCHARGE:  03/06/2008                               DISCHARGE SUMMARY   BRIEF HISTORY:  The patient is a 65 year old white male who I performed  an L5-S1 decompression and fusion many years ago.  The patient has done  well until recently when he has developed increasing back and hip pain  consistent with neurogenic claudication.  The patient was worked up with  a lumbar MRI, which demonstrated he had severe degeneration and  foraminal stenosis L4-5.  I discussed the various treatment options with  the patient including surgery.  The patient is aware of the risks,  benefits, and alternatives to surgery and decided to proceed with an  exploration of L5-S1 fusion with L4-5 decompression, instrumentation,  and fusion.   For further details of this admission, please refer to typed history and  physical.   HOSPITAL COURSE:  I admitted the patient to [facility] on  March 03, 2008.  On the day of admission, I explored his L5-S1 fusion  and performed a L4-5 decompression, instrumentation, and fusion.  The  surgery went well (for full details of this operation, please refer to  typed operative note).   POSTOPERATIVE COURSE:  The patient's postoperative course was  unremarkable and he was discharged to home on postoperative day #3.   DISCHARGE PRESCRIPTIONS:  The patient was given written discharge  prescriptions and instructed to follow up with me in 4 weeks.   FINAL DIAGNOSES:  L4-5 degenerative disease with spondylosis, stenosis,  lumbago, and lumbar radiculopathy.   PROCEDURE PERFORMED:  Exploration of L5-S1 fusion; bilateral L4  laminotomies, foraminotomies to decompress the bilateral L4  as well as  L5 nerve roots; L4-5 posterior lumbar interbody fusion with local  autograft bone and Actifuse/Vitoss bone graft extenders; insertion of  bilateral L4-5 interbody prosthesis ( PEEK interbody prosthesis); L4-S1  posterior segmental instrumentation with ST90 titanium screws and rods;  posterolateral arthrodesis L4-5 with locally morselized autograft,  Actifuse and Vitoss bone graft extenders.      Cristi Loron, M.D.  Electronically Signed     JDJ/MEDQ  D:  04/15/2008  T:  04/16/2008  Job:  [id number]

## 2010-08-04 NOTE — Op Note (Signed)
NAME:  Corey [name], Corey [name] NO.:  [id number]   MEDICAL RECORD NO.:  [id number]          PATIENT TYPE:  INP   LOCATION:  2550                         FACILITY:  [facility]   PHYSICIAN:  Elana Alm. Thurston Hole, M.D. DATE OF BIRTH:  Oct 11, 1945   DATE OF PROCEDURE:  04/29/2006  DATE OF DISCHARGE:                               OPERATIVE REPORT   PREOPERATIVE DIAGNOSIS:  1. Left knee degenerative joint disease.  2. Right knee degenerative joint disease.   POSTOPERATIVE DIAGNOSIS:  1. Left knee degenerative joint disease.  2. Right knee degenerative joint disease.   PROCEDURE:  1. Left total knee replacement using DePuy cemented total knee system      with #4 cemented femur, #4 cemented tibia with 15 mm polyethylene      RP tibial spacer and 38 mm polyethylene cemented patella.  2. Left total knee computer assisted navigation.  3. Right knee cortisone injection.   SURGEON:  Elana Alm. Thurston Hole, M.D.   ASSISTANT:  Julien Girt, P.A.   ANESTHESIA:  General.   OPERATIVE TIME:  1 hour 45 minutes.   COMPLICATIONS:  None.   DESCRIPTION OF PROCEDURE:  Corey [name] is brought to operating room on  04/29/2006, placed operative table supine position.  He received Ancef 1  g IV preoperatively for prophylaxis.  After being placed under general  anesthesia, both knees were examined.  Range of motion from 8 to 125  degrees with significant varus deformity bilaterally.  Both knee stable  ligamentous exam with normal patellar tracking.  He had a Foley catheter  placed under sterile conditions.  His right knee was injected under  sterile conditions per his request with a 40 mg of Depo-Medrol and 4 mL  of 0.25% Marcaine with epinephrine.  Left leg was prepped using sterile  DuraPrep and draped using sterile technique.  Leg was exsanguinated and  thigh tourniquet elevated to 365 mm.  Initially through a 15 cm  longitudinal incision based over the patella, initial exposure was made.  Underlying subcutaneous tissues were incised along the skin incision.  A  median arthrotomy was performed revealing an excessive amount of normal-  appearing joint fluid.  The articular surfaces were inspected.  He had  grade 4 changes medially, grade 3 and 4 changes laterally, grade 3 and 4  changes in the patellofemoral joint.  Significant deformity and medial  tibial plateau erosion was noted.  Osteophytes were removed off the  femoral condyles and tibial plateau.  Medial lateral meniscal remnants  were removed as well as anterior cruciate ligament remnant.  At this  point the pins from the computer navigation set were placed, two of  these and the proximal tibial metaphysis and two in the distal femoral  metaphysis for placement and activation of the computer navigation  system.  This was used due to his significant deformity.  Once  navigation system was registered and set, deformities were noted.  He  had  8 degrees flexion contracture and 8-10 degrees varus deformity.  Using the navigation system, initially the distal femoral cutting jig  was placed in the appropriate manner  rotation and a distal 11 mm cut was  made using the navigation system, distal femur was incised.  #4 was  found to be the appropriate size.  #4 cutting jig was placed also with  the navigation system for external rotation and then these cuts were  made.  These cuts were all verified found to be perfect and exact.  Proximal tibia was then exposed.  Using the proximal tibial guide with  computer navigation, a proximal tibial cut was made completely  alleviating his varus deformity, removing 12 mm off the higher side and  2 mm off the lower side.  This cut was also verified.  Spacer blocks  were then placed in flexion and extension.  15 mm block gave excellent  balancing, excellent stability and showed excellent correction of his  flexion and varus deformity.  At this point, a proximal tibia was  exposed.  #4  tibial baseplate was placed and a keel cut was made.  The  PCL box cutter was placed on the distal femur and then these cuts were  made.  At this point the #4 femoral trial was placed, #4 tibial base  plate trial with a 15 mm polyethylene RP tibial spacer, knee was  reduced, taken through range of motion from 0 to 125 degrees with  excellent stability, excellent correction of his flexion and varus  deformities documented by the navigation system.  At this point, the  navigation set system was deactivated, patellofemoral tracking was also  noted to be normal.  The pins were removed.  The patella was incised.  A  resurfacing 10 mm cut was made and three locking holes placed for a 38  mm patella.  The patellar trial was placed, again patellofemoral  tracking was evaluated.  This was found to be normal and  At this point  it is felt that all trial components were of excellent size, fit and  stability.  They were then removed.  The knee was then jet lavage  irrigated with 3 liters of saline.  The proximal tibia was then exposed.  #4 tibial baseplate with cement backing was hammered in position with an  excellent fit with excess cement being removed from around the edges.  #4 femoral component with cement backing was hammered in position also  with an excellent fit with excess cement being removed around the edges.  The 15 mm polyethylene RP tibial spacer was placed on tibial baseplate.  The knee reduced, taken through range of motion from 0 to 125 degrees  with excellent stability and excellent correction of his flexion and  varus deformities.  A 38 mm polyethylene cement backed patella was then  placed in its position and held there with a clamp.  After the cement  hardened, patellofemoral tracking was again evaluated.  This was found  to be normal.  At this point it is felt that all components were of  excellent size, fit and stability.  The wound was again irrigated with saline.  Tourniquet  was released.  Hemostasis obtained with cautery.  The arthrotomy was closed with #1 Ethibond suture over two medium  Hemovac drains.  Subcutaneous tissues closed with 0 and 2-0 Vicryl  subcuticular layer closed with 4-0 Monocryl.  Sterile dressings were  applied.  Long-leg splint applied and then the patient awakened,  extubated, taken to recovery room in stable condition.  Needle, sponge  counts correct x2 in the case.  Neurovascular status normal.      Robert A.  Thurston Hole, M.D.  Electronically Signed     RAW/MEDQ  D:  04/29/2006  T:  04/29/2006  Job:  [id number]

## 2010-08-04 NOTE — Op Note (Signed)
[place]. [facility]  Patient:    EMMERICH, CRYER                      MRN: [id number] Proc. Date: 02/29/00 Adm. Date:  [id number] Attending:  Tressie Stalker D                           Operative Report  PREOPERATIVE DIAGNOSIS:  Grade 2 lytic spondylolisthesis, L5-S1, bilateral L5 spondylolysis, spinal stenosis, degenerative disk disease.  POSTOPERATIVE DIAGNOSIS:  Grade 2 lytic spondylolisthesis, L5-S1, bilateral L5 spondylolysis, spinal stenosis, degenerative disk disease.  OPERATION PERFORMED:  L5 Gill procedure with removal of the L5 spinous process and lamina and abnormal pars region, L5-S1 posterior lumbar interbody fusion with insertion of Synthes TLIF allograft bone.  Posterior nonsegmental instrumentation with Surgical Dynamics 90 degree ____________ screws and rods, L5 and S1; posterior lateral arthrodesis L5-S1 bilaterally with local morselized autograft bone and Orthoblast.  SURGEON:  Cristi Loron, M.D.  ASSISTANT:  Mena Goes. Franky Macho, M.D.  ANESTHESIA:  General endotracheal.  ESTIMATED BLOOD LOSS:  500 cc.  SPECIMENS: None.  DRAINS:  None.  COMPLICATIONS:  None.  INDICATIONS FOR PROCEDURE:  The patient is a 65 year old white male who is approximately four months status post a left L4 and L5 laminotomy and foraminotomy for treatment of leg pain.  He did well in that his leg resolved on the left but he began having more pain on the right and some increase in back pain.  I therefore discussed ____________ with him including surgery. The patient has elected to proceed with surgery after weighing risks, benefits and alternatives.  DESCRIPTION OF PROCEDURE:  The patient was brought to the operating room by anesthesia team. General endotracheal anesthesia was induced.  The patient was then turned to the prone position on the chest rolls.  His lumbosacral region was then shaved and prepared with Betadine scrub and Betadine  solution. Sterile drapes were applied.  I then injected the area to be incised with Marcaine with epinephrine solution and used a scalpel to make a vertical incision at the patient lumbosacral junction through his pre-existing surgical incision.  I used electrocautery to dissect down through the thoracolumbar fascia, divided the thoracolumbar fascia bilaterally performing a bilateral subperiosteal dissection, stripped the paraspinous musculature from the spinous process of the lamina of L4 and L5 and the upper sacrum.  I used the Insurance claims handler for exposure.  I then obtained an intraoperative radiograph to confirm my location.  I began on the right side, the unoperated side.  I used the Leksell rongeur to remove the L5 spinous process and part of the lamina as well as the facet and saved this bone for later use in the fusion.  I used the Kerrison punch to remove the remainder of the L5 lamina as well as the pars region and the inferior facet at L5.  I got good decompression of the thecal sac and I performed a foraminotomy about the right S1 nerve root as well as the L5 nerve root.  There was considerable lateral recess stenosis about the L5 nerve root.  I then repeated the procedure on the left side.  This side was much more scarred down from previous surgery but I dissected through the scar tissue and got a good decompression of the thecal sac and a good foraminotomy about the left S1 and L5 nerve root.  I then carefully freed up the thecal  sac and incised the L5-S1 intervertebral disk with the 15 blade scalpel and performed a partial diskectomy.  I did this bilaterally.  I then inserted the Synthes vertebral body spreader into the interspace on the ____________ on the left and distracted the interspace. This opened up the interspace a great deal and I was able to place a 9 mm TLIF interbody spacer into the interspace after carefully retracting the L5 and S1 nerve roots out of harms  way.  I did this under fluoroscopic guidance.  I then removed the vertebral body spreader from the left side and then packed the left side of the intervertebral disk space with local morselized autograft bone.  Having completed the posterior lumbar interbody fusion and the Gill procedure, I now turned my attention to the posterior spinal nonsegmental instrumentation.  I used electrocautery to expose the bilateral L5 transverse processes.  I then under fluoroscopic guidance, I used the drill to decorticate posterior to the bilateral L5 and S1 pedicles.  I then cannulated the pedicles with the pedicle probe and tapped the pedicle with 5.25 mm tap. I inserted a 6.5 x 50 screw into the right L5 pedicle after probing the tapped hole and making sure it was well within bone.  It was.  In the process of putting this pedicle screw in, the cortex of the L5 pedicle did fracture medially and it was more or less free floating.  I therefore removed it with the Kerrison punch but the screw was in good position and was not contacting the L5 nerve.  I then tapped the right L5 pedicle, probed about the tapped pedicle and then inserted a 5.75 x 50 mm pedicle screw on this side and probed about the exterior of the pedicle and the screw was well within the bone.  In a similar fashion, I put a 6.75 x 35 mm pedicle screw in the bilateral S1 pedicles.  I then obtained the appropriate length rod, contoured the rod to the lordosis and connected the unilateral pedicle screws and secured it with the appropriate caps.  After putting the posterior segment instrumentation, the placement of pedicle screws was done with AP and lateral fluoroscopic guidance.  I now turned my attention to the posterolateral arthrodesis.  I used the high speed drill to decorticate the S1 lamina and lateral ____________ as well as the remainder of the superior facet at S1 and then laid a combination of local morselized autograft bone as  well as Orthoblast over the lateral ____________ to complete the posterolateral fusion.  I then achieved stringent hemostasis with bipolar electrocautery.  In inspected the thecal sac and bilateral L5 and  S1 nerve roots and noted they were well decompressed.  I then removed the retractor and reapproximated the patients thoracolumbar fascia with interrupted #1 Vicryl, subcutaneous tissues with interrupted 2-0 Vicryl, the skin with Steri-Strips and benzoin.  The wound was then coated with bacitracin ointment.  Sterile dressing was applied.  Drapes were removed.  The patient was subsequently returned to the supine position where he was extubated by the anesthesia team and transported to the post anesthesia care unit in stable condition.  All sponge, needle and instrument counts were correct at the end of this case. DD:  02/29/00 TD:  03/01/00 Job: [id number]

## 2010-08-04 NOTE — Op Note (Signed)
[facility]  Patient:    Corey [name], Corey [name]                      MRN: [id number] Proc. Date: 11/13/99 Adm. Date:  [id number] Attending:  Tressie Stalker D                           Operative Report  BRIEF HISTORY:  The patient is a 65 year old white male who has a long-term diagnosis of spondylolisthesis.  He began complaining of significant left leg pain and numbness more recently.  He failed medical management and was worked up with a lumbar MI which demonstrated a grade I/II spondylolisthesis L5-S1 with left ______ spinal stenosis L5-S1.  The patients findings on his physical exam were consistent with a left L5 radiculopathy, and I discussed the various treatments which included medical management, steroid injections, and surgery. I discussed both a laminotomy-foraminotomy versus that operation plus a spinal fusion.  The patient elected the former after weighing the risks, benefits, and alternative surgery.  PREOPERATIVE DIAGNOSIS:  L5 spondylolysis, L5-S1 spondylolithesis, spinal stenosis L5-S1, and L5-S1 spondylosis.  PROCEDURE:  Left L5 hemilaminectomy and left L4 laminotomy-foraminotomy.  SURGEON:  Cristi Loron, M.D.  ASSISTANT:  Danae Orleans. Venetia Maxon, M.D.  ANESTHESIA:  General endotracheal.  ESTIMATED BLOOD LOSS:  150 cc.  SPECIMENS:  None.  DRAINS:  None.  COMPLICATIONS:  None.  DESCRIPTION OF PROCEDURE:  The patient was brought to the operating room by the anesthesia team.  General endotracheal anesthesia was induced.  The patient was then turned to the prone position on the Wilson frame.  His lumbosacral region was then shaved and prepared with Betadine scrub and Betadine solution.  Sterile drapes were applied, and then an injection was made into the anterior medial sides with Marcaine with epinephrine solution. Used a scalpel to make a vertical incision in midline over the L4-5 and L5-S1 interspaces.  I used electrocautery to  dissect down to the thoracolumbar fascia.  I divided the fascia just left of the midline, performing a left-sided sub paralysis dissection stripping the paraspinous musculature from the left spinous process and lamina of L4-5 in the upper sacrum.  I started the [facility] retractor for exposure and then obtained an intraoperative radiograph to confirm our location.  I then brought the operative microscope into the field and under some magnification illumination, I completed the decompression.  I used the Midas Rex high speed drill to perform a left L5 laminotomy, i.e., I drilled in the cephalad direction until I encountered the insertion of the ligamenta flava.  I then performed a limited left L4 laminotomy, again, drilling in a cephalad direction, drilling off the ______ left L4 lamina.  Of note, the L5 bilateral spondylolysis was plainly evident as the spinous process of the lamina of L5 was "free-floating".  The patient had an obvious spondylolysis visible under the microscope.  I then used the Kerrison punches to remove the left L5-S1 ligamenta flava and then the remainder of the left L5 lamina.  I performed a complete left L5 hemilaminectomy.  I removed the L4-5 ligamenta flava as well as the ______ of the left L4 lamina.  This exposed the thecal sac, and the left L4-5 and S1 nerve root.  I then removed some more ligamenta flava from the lateral recess, decompressing the nerve roots.  At the spondylolysis, there was abundant soft tissue which I removed using the Kerrison punch, undercutting  the pars, removing the abnormal pars region.  I performed a generous foraminotomy about the left L5 nerve root, and I followed around the L5 pedicle and then palpated about the left L4-5 and S1 nerve roots and followed them out through a neural foramen and noted they were well decompressed at this point.  The spondylolithesis at L5-S1 was plainly evident.  There was no frank disk herniation.  I then  copiously irrigated the wound with Bacitracin solution, removed the solution, achieved hemostasis with electrocautery and Gelfoam.  I then removed the McCullough retractor and reapproximated the patients thoracolumbar fascia with interrupted #1 Vicryl, the subcutaneous tissue with interrupted 2-0 Vicryl, and the skin with Steri-Strips and Benzoin.  The wound was then coated with Bacitracin ointment.  A sterile dressing was applied and the drains were removed, and the patient was subsequently returned to a supine position where he was extubated by the anesthesia team and transferred to the post anesthesia care unit in stable condition.  All sponge, instrument, and needle counts were correct at the end of this case.   DESCRIPTION OF PROCEDURE: DD:  11/13/99 TD:  11/13/99 Job: [id number]

## 2010-08-04 NOTE — Discharge Summary (Signed)
[facility]  Patient:    Corey [name], Corey [name]                      MRN: [id number] Adm. Date:  [id number] Disc. Date: [id number] Attending:  Tressie Stalker D                           Discharge Summary  For full details of this admission, please refer to typed History and Physical.  BRIEF HISTORY:  The patient is a 65 year old white male who is approximately four months status post a left L4 and L5 laminotomy and foraminotomy for treatment of his left leg pain.  He did well in that his left leg pain resolved, but then began having pain on the right side with some increasing back pain.  I therefore discussed the various treatment options with him, including surgery.  The patient has elected to proceed with surgery after weighing the risks, benefits, and alternatives.  For past medical history, past surgical history, medications prior to admission, drug allergies, family medical history, social history, admission physical exam, imaging studies, assessment and plan, etc. please refer to typed History and Physical.  HOSPITAL COURSE:  I admitted the patient to [facility] on February 29, 2000 with a diagnosis of a grade 2 lytic spondylolithesis at L5-S1 with bilateral L5 spondylolysis, spinal stenosis, and degenerative disk disease. On the day of admission I performed an L5 Gill procedure with removal of the L5 spinous process, lamina, and abnormal pars region, and L5-S1 posterior lumbar interbody fusion with insertion of Synthes TLIF allograft bone, as well as posterior nonsegmental instrumentation with ______ 90 degree titanium screws and rods at L5-S1 and a posterolateral arthrodesis at L5-S1 bilaterally with local morcellized autograft bone and ______.  The surgery went well without complications (for full details of this operation, please refer to typed operative note).  POSTOPERATIVE COURSE:  The patients postoperative course was  essentially unremarkable.  He did have a low grade fever on postoperative day #1, i.e. March 01, 2000 to 101.1.  This resolved, there were no signs of infection. By postoperative day #3, i.e. March 03, 2000, the patient was afebrile, his vital signs were stable, he was eating well, ambulating well, and his wound was healing well without signs of infection.  He was therefore discharged to home.  FINAL DIAGNOSES:  Grade 2 lytic spondylolithesis L5-S1, bilateral L5 spondylolysis, spinal stenosis, degenerative disk disease.  PROCEDURE PERFORMED:  L5 Gill procedure, L5-S1 posterior lumbar interbody fusion with insertion of Synthes TLIF allograft bone, posterior nonsegmental instrumentation with ______ 90 degree titanium screws and rods at L5-S1, and a posterolateral arthrodesis at L5-S1 with local morcellized autograft bone and ______. DD:  03/14/00 TD:  03/15/00 Job: [id number]

## 2010-08-30 ENCOUNTER — Telehealth: Payer: Self-pay | Admitting: Cardiology

## 2010-08-30 NOTE — Telephone Encounter (Signed)
Called wanting to leave a message that her husband needs a refill of his Azor both strengths [facility] [address] [phone]. Please call back when you have a moment. And she wanted you to have a great day :)

## 2010-08-30 NOTE — Telephone Encounter (Signed)
Returned call to Barnett Hatter regarding [employer]. Wife requesting samples of Azor  5mg /20mg . She will return call as to what strength they need called to pharmacy for refill. Samples available for pt.

## 2010-08-30 NOTE — Telephone Encounter (Signed)
Wife spoke to pt Corey [name] and clarified dosage of Azor. Asking for samples of Azor 5mg /40mg . Samples available. No need for script to be phoned in at this time will call when they need script.

## 2010-09-25 ENCOUNTER — Encounter: Payer: Self-pay | Admitting: Cardiology

## 2010-09-28 ENCOUNTER — Encounter: Payer: Self-pay | Admitting: Cardiology

## 2010-09-29 ENCOUNTER — Ambulatory Visit (INDEPENDENT_AMBULATORY_CARE_PROVIDER_SITE_OTHER): Payer: BC Managed Care – PPO | Admitting: Cardiology

## 2010-09-29 ENCOUNTER — Encounter: Payer: Self-pay | Admitting: Cardiology

## 2010-09-29 VITALS — BP 106/78 | HR 74 | Ht 71.5 in | Wt 206.0 lb

## 2010-09-29 DIAGNOSIS — I712 Thoracic aortic aneurysm, without rupture, unspecified: Secondary | ICD-10-CM

## 2010-09-29 DIAGNOSIS — I447 Left bundle-branch block, unspecified: Secondary | ICD-10-CM

## 2010-09-29 DIAGNOSIS — IMO0001 Reserved for inherently not codable concepts without codable children: Secondary | ICD-10-CM

## 2010-09-29 DIAGNOSIS — R079 Chest pain, unspecified: Secondary | ICD-10-CM

## 2010-09-29 DIAGNOSIS — I2510 Atherosclerotic heart disease of native coronary artery without angina pectoris: Secondary | ICD-10-CM

## 2010-09-29 DIAGNOSIS — I7121 Aneurysm of the ascending aorta, without rupture: Secondary | ICD-10-CM

## 2010-09-29 DIAGNOSIS — I10 Essential (primary) hypertension: Secondary | ICD-10-CM

## 2010-09-29 MED ORDER — AMLODIPINE-OLMESARTAN 5-40 MG PO TABS: 1.0000 | ORAL_TABLET | Freq: Every day | ORAL | Status: DC

## 2010-09-29 MED ORDER — NITROGLYCERIN 0.4 MG SL SUBL: 0.4000 mg | SUBLINGUAL_TABLET | SUBLINGUAL | Status: DC | PRN

## 2010-09-29 NOTE — Assessment & Plan Note (Signed)
His ascending aorta measured 44-45 mm by CT angiogram in September of 2011. We will plan on repeating a CT this fall. We will continue with his antihypertensive therapy.

## 2010-09-29 NOTE — Progress Notes (Signed)
   Corey [name] Date of Birth: March 19, 1946   History of Present Illness: Corey [name] is seen for followup today. He is still bothered by symptoms of chest pain. He describes it as a central chest burning pain. It is worse when he walks or when he plays golf. He is able to exercise at the [facility] with extensive work on an exercise bike without any discomfort. His blood pressure has been well controlled. He does feel that his symptoms are better over the past 6 months in general.  Current Outpatient Prescriptions on File Prior to Visit  Medication Sig Dispense Refill  . diclofenac (VOLTAREN) 50 MG EC tablet Take 50 mg by mouth daily. 75 mg daily       . hydrochlorothiazide 25 MG tablet Take 1 tablet (25 mg total) by mouth daily.  30 tablet  5  . metoprolol (TOPROL-XL) 100 MG 24 hr tablet Take 100 mg by mouth daily.        Marland Kitchen omeprazole (PRILOSEC) 20 MG capsule Take 20 mg by mouth daily.        . pravastatin (PRAVACHOL) 20 MG tablet Take 20 mg by mouth daily.        Marland Kitchen DISCONTD: amLODipine-olmesartan (AZOR) 5-20 MG per tablet Take 1 tablet by mouth daily.          Allergies  Allergen Reactions  . Tramadol Hcl     REACTION: dyspnea    Past Medical History  Diagnosis Date  . Hypertension   . Coronary artery disease     minimal  . Nephrolithiasis   . Lactose intolerance   . Ascending aortic aneurysm   . Left bundle branch block     Past Surgical History  Procedure Date  . Knee surgery     arthroscopic  . Total knee arthroplasty     bilateral  . Elbow surgery   . Lumbar spine surgery   . Appendectomy   . Cardiac catheterization 12/22/09    EF60%/minimal nonobstructive atherosclerotic coronary artery disease/normal lt ventricular function/mod aortic root dilatation    History  Smoking status  . Never Smoker   Smokeless tobacco  . Never Used    History  Alcohol Use No    Family History  Problem Relation Age of Onset  . Cancer Father     pancreatic  . Heart attack Brother      Review of Systems: The review of systems is positive for chest pain as noted above.  All other systems were reviewed and are negative.  Physical Exam: BP 106/78  Pulse 74  Ht 5' 11.5" (1.816 m)  Wt 206 lb (93.441 kg)  BMI 28.33 kg/m2 He is a pleasant white male in no acute distress. His HEENT exam is unremarkable. He is normocephalic, atraumatic. PERRLA. Sclera nonicteric. Oropharynx is clear. Neck is supple no JVD, adenopathy, thyromegaly, or bruits. Lungs are clear. Cardiac exam reveals a regular rate and rhythm with a soft systolic ejection murmur at the left border. Abdomen is soft and nontender. He has no edema. Pedal pulses are good. He is alert and oriented x3. Cranial nerves II through XII are intact. LABORATORY DATA: In June 2012 urinalysis showed 15-20 red cells per high power field. It was otherwise negative. BUN was 25, creatinine 1.4. All of the chemistries were normal. CBC was normal. Total cholesterol 206, triglycerides 216, HDL 35, LDL 128. Apolipoprotein B. Was 88.  Assessment / Plan:

## 2010-09-29 NOTE — Assessment & Plan Note (Signed)
Blood pressure is under excellent control on current medications. 

## 2010-09-29 NOTE — Assessment & Plan Note (Signed)
The symptoms are still atypical given that they occur with some activity but not with others. His prior cardiac evaluation showed only minimal nonobstructive disease in the right coronary artery by cardiac catheterization. Nuclear stress test was normal. Echocardiogram was normal. We will give him a therapeutic trial of sublingual nitroglycerin to see if this helps with his discomfort. If so we might consider placing him a long-acting nitrate. Otherwise I would continue with his risk factor modification.

## 2010-09-29 NOTE — Patient Instructions (Signed)
Continue your current medications.  We can try NTG sublingual prn chest pain to see if this helps.  We will schedule you for a CT of the chest in September.

## 2010-10-23 ENCOUNTER — Other Ambulatory Visit: Payer: BC Managed Care – PPO

## 2010-11-28 ENCOUNTER — Other Ambulatory Visit: Payer: Self-pay | Admitting: Cardiology

## 2010-11-28 LAB — BUN: BUN: 32 mg/dL — ABNORMAL HIGH (ref 6–23)

## 2010-11-29 ENCOUNTER — Ambulatory Visit
Admission: RE | Admit: 2010-11-29 | Discharge: 2010-11-29 | Disposition: A | Discharge status: Home / Self Care | Payer: Medicare Other | Source: Ambulatory Visit | Attending: Cardiology | Admitting: Cardiology

## 2010-11-29 ENCOUNTER — Telehealth: Payer: Self-pay | Admitting: *Deleted

## 2010-11-29 DIAGNOSIS — IMO0001 Reserved for inherently not codable concepts without codable children: Secondary | ICD-10-CM

## 2010-11-29 MED ORDER — IOHEXOL 300 MG/ML  SOLN
50.0000 mL | Freq: Once | INTRAMUSCULAR | Status: AC | PRN
  Administered 2010-11-29: 50 mL via INTRAVENOUS

## 2010-11-29 NOTE — Telephone Encounter (Signed)
Message copied by Lorayne Bender on Wed Nov 29, 2010  1:08 PM ------      Message from: [place], PETER M      Created: Wed Nov 29, 2010 12:48 PM       No change in aortic enlargement. Stable CT.      [name] [place]

## 2010-11-29 NOTE — Telephone Encounter (Signed)
Notified of wife of CT scan results. Will send copy to Dr. Eloise Harman.

## 2010-12-21 ENCOUNTER — Telehealth: Payer: Self-pay | Admitting: *Deleted

## 2010-12-21 ENCOUNTER — Ambulatory Visit (INDEPENDENT_AMBULATORY_CARE_PROVIDER_SITE_OTHER): Payer: Medicare Other | Admitting: *Deleted

## 2010-12-21 DIAGNOSIS — I10 Essential (primary) hypertension: Secondary | ICD-10-CM

## 2010-12-21 LAB — BASIC METABOLIC PANEL
BUN: 13 mg/dL (ref 6–23)
BUN: 41 mg/dL — ABNORMAL HIGH (ref 6–23)
CO2: 26 mEq/L (ref 19–32)
Calcium: 8.1 mg/dL — ABNORMAL LOW (ref 8.4–10.5)
Calcium: 9.3 mg/dL (ref 8.4–10.5)
Calcium: 8.9 mg/dL (ref 8.4–10.5)
Chloride: 109 mEq/L (ref 96–112)
Creatinine, Ser: 1.13 mg/dL (ref 0.4–1.5)
Creatinine, Ser: 2.2 mg/dL — ABNORMAL HIGH (ref 0.4–1.5)
GFR calc Af Amer: >60 mL/min (ref >60)
GFR calc non Af Amer: >60 mL/min (ref >60)
Glucose, Bld: 115 mg/dL — ABNORMAL HIGH (ref 70–99)
Glucose, Bld: 97 mg/dL (ref 70–99)

## 2010-12-21 LAB — TYPE AND SCREEN
ABO/RH(D): A POS
Antibody Screen: NEGATIVE

## 2010-12-21 LAB — CBC
MCHC: 35.5 g/dL (ref 30.0–36.0)
Platelets: 184 10*3/uL (ref 150–400)
RBC: 4.97 MIL/uL (ref 4.22–5.81)
RDW: 12.5 % (ref 11.5–15.5)
RDW: 12.5 % (ref 11.5–15.5)

## 2010-12-21 NOTE — Telephone Encounter (Signed)
Notified of lab results. Will send copy to Dr. Eloise Harman

## 2010-12-21 NOTE — Telephone Encounter (Signed)
Spoke w/ wife Barth Kirks and notified her of lab results. He will come this afternoon for repeat BMET.

## 2010-12-21 NOTE — Telephone Encounter (Signed)
Message copied by Lorayne Bender on Thu Dec 21, 2010  5:46 PM ------      Message from: [place], PETER M      Created: Thu Dec 21, 2010  5:06 PM       Stable creatnine since 11/28/10 but still higher than last year. I would recommend holding NSAIDS (Voltaren), Hctz and follow up with PCP.       [name] [place]

## 2010-12-21 NOTE — Telephone Encounter (Signed)
Message copied by Lorayne Bender on Thu Dec 21, 2010  1:24 PM ------      Message from: [place], PETER M      Created: Thu Dec 21, 2010  9:59 AM       Bun and creatnine were elevated. Need to hold Hctz and Voltaren until we get this back.      [name] [place]

## 2011-01-03 LAB — CBC
HCT: 37.7 — ABNORMAL LOW
Hemoglobin: 12.3 — ABNORMAL LOW
Hemoglobin: 13.1
MCHC: 34.4
MCHC: 34.4
Platelets: 208
Platelets: 502 — ABNORMAL HIGH
RBC: 4.03 — ABNORMAL LOW
RDW: 13.5
RDW: 14.1 — ABNORMAL HIGH
WBC: 8.6
WBC: 9

## 2011-01-03 LAB — BASIC METABOLIC PANEL
BUN: 12
BUN: 19
CO2: 23
CO2: 27
Calcium: 8.2 — ABNORMAL LOW
Calcium: 8.6
Calcium: 9
Creatinine, Ser: 0.94
Creatinine, Ser: 0.94
GFR calc Af Amer: >60
GFR calc non Af Amer: >60
GFR calc non Af Amer: >60
Glucose, Bld: 119 — ABNORMAL HIGH
Glucose, Bld: 120 — ABNORMAL HIGH
Glucose, Bld: 144 — ABNORMAL HIGH
Potassium: 3.8
Potassium: 3.9
Sodium: 136
Sodium: 137

## 2011-01-03 LAB — URINE MICROSCOPIC-ADD ON

## 2011-01-03 LAB — URINALYSIS, ROUTINE W REFLEX MICROSCOPIC
Bilirubin Urine: NEGATIVE
Nitrite: NEGATIVE
Specific Gravity, Urine: 1.022
Urobilinogen, UA: 1
pH: 6.5

## 2011-01-03 LAB — DIFFERENTIAL
Basophils Absolute: 0
Basophils Relative: 0
Lymphocytes Relative: 22
Neutro Abs: 4.8
Neutrophils Relative %: 65

## 2011-01-03 LAB — PROTIME-INR: INR: 1.4

## 2011-01-04 LAB — DIFFERENTIAL
Basophils Absolute: 0
Basophils Relative: 0
Eosinophils Absolute: 0.2
Eosinophils Relative: 3
Monocytes Absolute: 0.4
Monocytes Relative: 8
Neutro Abs: 3.3

## 2011-01-04 LAB — URINALYSIS, ROUTINE W REFLEX MICROSCOPIC
Bilirubin Urine: NEGATIVE
Hgb urine dipstick: NEGATIVE
Specific Gravity, Urine: 1.018 (ref 1.005–1.035)
Urobilinogen, UA: 0.2
pH: 5.5

## 2011-01-04 LAB — COMPREHENSIVE METABOLIC PANEL
ALT: 29
Albumin: 3.9
Alkaline Phosphatase: 116
BUN: 13
Chloride: 107
Potassium: 4.4
Sodium: 139
Total Bilirubin: 0.9
Total Protein: 6.9

## 2011-01-04 LAB — CBC
HCT: 44
Hemoglobin: 15.4
Platelets: 247
RDW: 14.1 — ABNORMAL HIGH
WBC: 5.5

## 2011-01-04 LAB — URINE CULTURE: Colony Count: NO GROWTH

## 2011-01-04 LAB — TYPE AND SCREEN: ABO/RH(D): A POS

## 2011-01-22 ENCOUNTER — Telehealth: Payer: Self-pay | Admitting: Cardiology

## 2011-01-22 NOTE — Telephone Encounter (Signed)
Called requesting samples of Azor. Have available. Will be at front desk.

## 2011-01-22 NOTE — Telephone Encounter (Signed)
Pt requesting samples of azor

## 2011-03-26 ENCOUNTER — Telehealth: Payer: Self-pay | Admitting: Cardiology

## 2011-03-26 NOTE — Telephone Encounter (Signed)
Patient's wife called,stating patient's B/P was elevated yesterday 03/25/11,140/100,196/116,also complaining of lf shoulder pain.States today patient has not checked B/P.States today has been very busy.Appointment scheduled with Norma Fredrickson NP 03/27/11 at 11:30 am.

## 2011-03-26 NOTE — Telephone Encounter (Signed)
New Problem:    Patient's wife called to schedule an appointment for her husband.  He has been having bouts of high blood pressure for the past several weeks pain in his left shoulder.  She would like to see if he can be scheduled to come in as soon as possible. Please advise.

## 2011-03-27 ENCOUNTER — Encounter: Payer: Self-pay | Admitting: Nurse Practitioner

## 2011-03-27 ENCOUNTER — Ambulatory Visit (INDEPENDENT_AMBULATORY_CARE_PROVIDER_SITE_OTHER): Payer: Medicare Other | Admitting: Nurse Practitioner

## 2011-03-27 VITALS — BP 130/100 | HR 72 | Ht 71.5 in | Wt 202.0 lb

## 2011-03-27 DIAGNOSIS — I712 Thoracic aortic aneurysm, without rupture, unspecified: Secondary | ICD-10-CM

## 2011-03-27 DIAGNOSIS — I2510 Atherosclerotic heart disease of native coronary artery without angina pectoris: Secondary | ICD-10-CM | POA: Diagnosis not present

## 2011-03-27 DIAGNOSIS — I10 Essential (primary) hypertension: Secondary | ICD-10-CM

## 2011-03-27 LAB — BASIC METABOLIC PANEL
BUN: 16 mg/dL (ref 6–23)
CO2: 27 mEq/L (ref 19–32)
Calcium: 9.3 mg/dL (ref 8.4–10.5)
Chloride: 107 mEq/L (ref 96–112)
Creatinine, Ser: 1.1 mg/dL (ref 0.4–1.5)
GFR: 72.84 mL/min (ref >60.00)
Glucose, Bld: 78 mg/dL (ref 70–99)
Potassium: 4.5 mEq/L (ref 3.5–5.1)
Sodium: 144 mEq/L (ref 135–145)

## 2011-03-27 MED ORDER — DOXAZOSIN MESYLATE 2 MG PO TABS: ORAL_TABLET | ORAL | Status: DC

## 2011-03-27 NOTE — Progress Notes (Signed)
   Corey [name] Date of Birth: Jul 26, 1945 Medical Record #[id number]  History of Present Illness: [name] is seen back today for a work in visit. He is seen for Dr. [place]. He has noted that his blood pressure has been trending up lately. He has been playing with his medicines. Taking an extra half of Azor. He increased his Metoprolol to a 100 mg daily. Still sees elevated readings. Has had some renal insufficiency. Trying to avoid NSAIDs and HCTZ per Dr. Elvis Coil recent note. Has had his aneurysm relooked at this summer and it is stable. Has been also noting some intermittent left arm pain. It just comes and goes and there is no pattern. Not exertional. He has just minimal disease per prior cath. No chest pain reported. Thinks he might be stressed out but not sure. Looks lots of information up on the internet.   Current Outpatient Prescriptions on File Prior to Visit  Medication Sig Dispense Refill  . amLODipine-olmesartan (AZOR) 5-40 MG per tablet Take 1 tablet by mouth daily.  30 tablet  0  . metoprolol (TOPROL-XL) 100 MG 24 hr tablet Take 100 mg by mouth daily.        . nitroGLYCERIN (NITROSTAT) 0.4 MG SL tablet Place 1 tablet (0.4 mg total) under the tongue every 5 (five) minutes as needed for chest pain.  10025 tablet  3  . omeprazole (PRILOSEC) 20 MG capsule Take 20 mg by mouth daily.        . pravastatin (PRAVACHOL) 20 MG tablet Take 20 mg by mouth daily.          Allergies  Allergen Reactions  . Tramadol Hcl     REACTION: dyspnea    Past Medical History  Diagnosis Date  . Hypertension   . Coronary artery disease     minimal per cath in 2011  . Nephrolithiasis   . Lactose intolerance   . Ascending aortic aneurysm     Last CT in July 2012  . Left bundle branch block     Past Surgical History  Procedure Date  . Knee surgery     arthroscopic  . Total knee arthroplasty     bilateral  . Elbow surgery   . Lumbar spine surgery   . Appendectomy   . Cardiac  catheterization 12/22/09    EF60%/minimal nonobstructive atherosclerotic coronary artery disease/normal lt ventricular function/mod aortic root dilatation    History  Smoking status  . Never Smoker   Smokeless tobacco  . Never Used    History  Alcohol Use No    Family History  Problem Relation Age of Onset  . Cancer Father     pancreatic  . Heart attack Brother     Review of Systems: The review of systems is positive for some flank pain. May have a recurrent kidney stone. Has lots of nocturia.  All other systems were reviewed and are negative.  Physical Exam: BP 130/100  Pulse 72  Ht 5' 11.5" (1.816 m)  Wt 202 lb (91.627 kg)  BMI 27.78 kg/m2 Patient is very pleasant and in no acute distress. He is a little anxious. Skin is warm and dry. Color is normal.  HEENT is unremarkable. Normocephalic/atraumatic. PERRL. Sclera are nonicteric. Neck is supple. No masses. No JVD. Lungs are clear. Cardiac exam shows a regular rate and rhythm. Abdomen is soft. Extremities are without edema. Gait and ROM are intact. No gross neurologic deficits noted.   LABORATORY DATA:   Assessment / Plan:

## 2011-03-27 NOTE — Patient Instructions (Signed)
Lets keep you on your current medicines.  Avoid the Voltaren.  We are going to add Cardura (Doxazosin) take only a half of a tablet each night.   Continue to monitor your blood pressure at home.  We will see you back in 3 weeks.  Call the [facility] office at [phone] if you have any questions, problems or concerns.

## 2011-03-27 NOTE — Assessment & Plan Note (Signed)
Blood pressure has trended back up. Not using salt. Remains active. He also notes significant nocturia of at least 2 to 3 x per night. Will add Cardura 2 mg. Will start out at just a half of a tablet. He is to continue with his other medicines. We will check a BMET today. May need to consider renal duplex in the future. I will see him back in 3 weeks. He will continue to monitor his blood pressure at home. Patient is agreeable to this plan and will call if any problems develop in the interim.

## 2011-03-27 NOTE — Assessment & Plan Note (Signed)
Has minimal disease per his cath in 2011. Would continue to monitor. He remains on aspirin and uses NTG prn.

## 2011-03-27 NOTE — Assessment & Plan Note (Signed)
His scans have been stable. Need better blood pressure control however. Will try the low dose Cardura.

## 2011-03-30 DIAGNOSIS — Z23 Encounter for immunization: Secondary | ICD-10-CM | POA: Diagnosis not present

## 2011-04-17 ENCOUNTER — Ambulatory Visit: Payer: Medicare Other | Admitting: Nurse Practitioner

## 2011-04-30 ENCOUNTER — Telehealth: Payer: Self-pay | Admitting: Cardiology

## 2011-04-30 MED ORDER — AMLODIPINE-OLMESARTAN 5-40 MG PO TABS: 1.0000 | ORAL_TABLET | Freq: Every day | ORAL | Status: DC

## 2011-04-30 NOTE — Telephone Encounter (Signed)
New problem:     Patient called in needing a prescription of amLODipine-olmesartan (AZOR) 5-40 MG per tablet to the pharmacy listed on his profile.

## 2011-05-04 ENCOUNTER — Telehealth: Payer: Self-pay | Admitting: Cardiology

## 2011-05-04 NOTE — Telephone Encounter (Signed)
New Msg: pt wife calling with question about pt medication azor and how to proceed with pt taking this medication. Please return pt wife call to discuss further.

## 2011-05-04 NOTE — Telephone Encounter (Signed)
[facility] called was told [facility] request [facility] for azor .Dr.Jordan signed form and [facility] was [phone].

## 2011-05-04 NOTE — Telephone Encounter (Signed)
New Problem:      Patient called back about his medication. Please call back.

## 2011-05-07 ENCOUNTER — Telehealth: Payer: Self-pay | Admitting: Cardiology

## 2011-05-07 NOTE — Telephone Encounter (Signed)
Patient called to have his insurance called for pior authorization for Azor 5/40 mg. [facility] to  [facility]. A 28/5 mg-40 mg Azor sample tablets put at the front desk for pt, patient aware.

## 2011-05-07 NOTE — Telephone Encounter (Signed)
New msg Pt said he need authorization for Amlodipine-olmesartan-azor to get this med refilled per  [facility] Please let him know when done

## 2011-05-16 MED ORDER — AMLODIPINE BESYLATE 5 MG PO TABS: 5.0000 mg | ORAL_TABLET | Freq: Every day | ORAL | Status: DC

## 2011-05-16 MED ORDER — LOSARTAN POTASSIUM 100 MG PO TABS: 100.0000 mg | ORAL_TABLET | Freq: Every day | ORAL | Status: DC

## 2011-05-16 NOTE — Telephone Encounter (Signed)
Patient called was told insurance has denied azor.Dr.Jordan has prescribed amlodipine 5 mg daily and losartan 100 mg daily.[facility].

## 2011-05-16 NOTE — Telephone Encounter (Signed)
Patient called to have his insurance called for pior authorization for Azor 5/40 mg. [facility] to  [facility]ax [facility]. A 28/5 mg-40 mg Azor sample tablets put at the front desk for pt, patient aware.  

## 2011-08-15 DIAGNOSIS — R1084 Generalized abdominal pain: Secondary | ICD-10-CM | POA: Diagnosis not present

## 2011-08-15 DIAGNOSIS — R3129 Other microscopic hematuria: Secondary | ICD-10-CM | POA: Diagnosis not present

## 2011-08-15 DIAGNOSIS — N200 Calculus of kidney: Secondary | ICD-10-CM | POA: Diagnosis not present

## 2011-08-15 DIAGNOSIS — D4959 Neoplasm of unspecified behavior of other genitourinary organ: Secondary | ICD-10-CM | POA: Diagnosis not present

## 2011-08-16 DIAGNOSIS — R109 Unspecified abdominal pain: Secondary | ICD-10-CM | POA: Diagnosis not present

## 2011-08-16 DIAGNOSIS — R3129 Other microscopic hematuria: Secondary | ICD-10-CM | POA: Diagnosis not present

## 2011-08-16 DIAGNOSIS — N281 Cyst of kidney, acquired: Secondary | ICD-10-CM | POA: Diagnosis not present

## 2011-08-16 DIAGNOSIS — N201 Calculus of ureter: Secondary | ICD-10-CM | POA: Diagnosis not present

## 2011-08-20 DIAGNOSIS — R1084 Generalized abdominal pain: Secondary | ICD-10-CM | POA: Diagnosis not present

## 2011-08-20 DIAGNOSIS — N289 Disorder of kidney and ureter, unspecified: Secondary | ICD-10-CM | POA: Diagnosis not present

## 2011-08-20 DIAGNOSIS — N201 Calculus of ureter: Secondary | ICD-10-CM | POA: Diagnosis not present

## 2011-09-12 DIAGNOSIS — N289 Disorder of kidney and ureter, unspecified: Secondary | ICD-10-CM | POA: Diagnosis not present

## 2011-09-12 DIAGNOSIS — N200 Calculus of kidney: Secondary | ICD-10-CM | POA: Diagnosis not present

## 2011-09-12 DIAGNOSIS — N401 Enlarged prostate with lower urinary tract symptoms: Secondary | ICD-10-CM | POA: Diagnosis not present

## 2011-10-09 ENCOUNTER — Other Ambulatory Visit (HOSPITAL_COMMUNITY): Payer: Self-pay | Admitting: Urology

## 2011-10-09 ENCOUNTER — Ambulatory Visit (HOSPITAL_COMMUNITY)
Admission: RE | Admit: 2011-10-09 | Discharge: 2011-10-09 | Disposition: A | Discharge status: Home / Self Care | Payer: Medicare Other | Source: Ambulatory Visit | Attending: Urology | Admitting: Urology

## 2011-10-09 DIAGNOSIS — I10 Essential (primary) hypertension: Secondary | ICD-10-CM | POA: Insufficient documentation

## 2011-10-09 DIAGNOSIS — I2510 Atherosclerotic heart disease of native coronary artery without angina pectoris: Secondary | ICD-10-CM | POA: Diagnosis not present

## 2011-10-09 DIAGNOSIS — D4959 Neoplasm of unspecified behavior of other genitourinary organ: Secondary | ICD-10-CM

## 2011-10-09 DIAGNOSIS — N200 Calculus of kidney: Secondary | ICD-10-CM | POA: Diagnosis not present

## 2011-10-09 DIAGNOSIS — N289 Disorder of kidney and ureter, unspecified: Secondary | ICD-10-CM | POA: Diagnosis not present

## 2011-10-10 ENCOUNTER — Other Ambulatory Visit: Payer: Self-pay | Admitting: Urology

## 2011-10-12 ENCOUNTER — Ambulatory Visit (HOSPITAL_COMMUNITY)
Admission: RE | Admit: 2011-10-12 | Discharge: 2011-10-12 | Disposition: A | Discharge status: Home / Self Care | Payer: Medicare Other | Source: Ambulatory Visit | Attending: Urology | Admitting: Urology

## 2011-10-12 ENCOUNTER — Other Ambulatory Visit (HOSPITAL_COMMUNITY): Payer: Self-pay | Admitting: Urology

## 2011-10-12 DIAGNOSIS — D4959 Neoplasm of unspecified behavior of other genitourinary organ: Secondary | ICD-10-CM

## 2011-10-12 DIAGNOSIS — C649 Malignant neoplasm of unspecified kidney, except renal pelvis: Secondary | ICD-10-CM | POA: Diagnosis not present

## 2011-10-12 DIAGNOSIS — R918 Other nonspecific abnormal finding of lung field: Secondary | ICD-10-CM | POA: Diagnosis not present

## 2011-10-12 DIAGNOSIS — J984 Other disorders of lung: Secondary | ICD-10-CM | POA: Diagnosis not present

## 2011-10-17 ENCOUNTER — Encounter (HOSPITAL_COMMUNITY): Payer: Self-pay | Admitting: Pharmacy Technician

## 2011-10-18 HISTORY — PX: PARTIAL NEPHRECTOMY: SHX414

## 2011-10-22 ENCOUNTER — Encounter (HOSPITAL_COMMUNITY)
Admission: RE | Admit: 2011-10-22 | Discharge: 2011-10-22 | Disposition: A | Discharge status: Home / Self Care | Payer: Medicare Other | Source: Ambulatory Visit | Attending: Urology | Admitting: Urology

## 2011-10-22 ENCOUNTER — Encounter (HOSPITAL_COMMUNITY): Payer: Self-pay

## 2011-10-22 DIAGNOSIS — N200 Calculus of kidney: Secondary | ICD-10-CM | POA: Diagnosis not present

## 2011-10-22 DIAGNOSIS — Z01812 Encounter for preprocedural laboratory examination: Secondary | ICD-10-CM | POA: Diagnosis not present

## 2011-10-22 DIAGNOSIS — K560 Paralytic ileus: Secondary | ICD-10-CM | POA: Diagnosis not present

## 2011-10-22 DIAGNOSIS — M1990 Unspecified osteoarthritis, unspecified site: Secondary | ICD-10-CM

## 2011-10-22 DIAGNOSIS — C649 Malignant neoplasm of unspecified kidney, except renal pelvis: Secondary | ICD-10-CM | POA: Diagnosis not present

## 2011-10-22 HISTORY — PX: CYSTOSCOPY/RETROGRADE/URETEROSCOPY/STONE EXTRACTION WITH BASKET: SHX5317

## 2011-10-22 HISTORY — DX: Unspecified osteoarthritis, unspecified site: M19.90

## 2011-10-22 HISTORY — DX: Cardiac murmur, unspecified: R01.1

## 2011-10-22 LAB — BASIC METABOLIC PANEL
BUN: 16 mg/dL (ref 6–23)
Calcium: 9.2 mg/dL (ref 8.4–10.5)
GFR calc non Af Amer: 72 mL/min — ABNORMAL LOW (ref >90)
Glucose, Bld: 93 mg/dL (ref 70–99)

## 2011-10-22 LAB — CBC
MCH: 31.8 pg (ref 26.0–34.0)
MCHC: 36 g/dL (ref 30.0–36.0)
Platelets: 176 10*3/uL (ref 150–400)

## 2011-10-22 NOTE — Pre-Procedure Instructions (Addendum)
10-22-11 Teach back method used. EKG done today. 10-23-11 Pt. Aware to fill Rx for Mupirocin Oint. And use as directed.W. Kennon Portela

## 2011-10-22 NOTE — Patient Instructions (Addendum)
[name]  10/22/2011   Your procedure is scheduled on:  8-15 -2013  Report to [facility] at      0830  AM   Call this number if you have problems the morning of surgery: [phone] ? Presurgical Testing [phone]   Remember:   Do not eat food:After Midnight.  Follow bowel prep instructions per dr. Vevelyn Royals office day before.  Take these medicines the morning of surgery with A SIP OF WATER: Amlodipine, Prilosec.   Do not wear jewelry, make-up or nail polish.  Do not wear lotions, powders, or perfumes. You may wear deodorant.  Do not shave 48 hours prior to surgery.(face and neck okay, no shaving of legs)  Do not bring valuables to the hospital.  Contacts, dentures or bridgework may not be worn into surgery.  Leave suitcase in the car. After surgery it may be brought to your room.  For patients admitted to the hospital, checkout time is 11:00 AM the day of discharge.   Patients discharged the day of surgery will not be allowed to drive home.  Name and phone number of your driver: spouse  Special Instructions: CHG Shower Use Special Wash: 1/2 bottle night before surgery and 1/2 bottle morning of surgery.(avoid face and genitals)   Please read over the following fact sheets that you were given: MRSA Information, Blood Transfusion fact sheet, Incentive Spirometry Instruction.

## 2011-10-31 NOTE — H&P (Signed)
Chief Complaint  Right renal neoplasm   Reason For Visit  Reason for consult: To discuss minimally invasive surgical therapy for his right renal neoplasm. Physician requesting consult: Dr. Jerilee Field PCP: Dr. Jarome Matin   History of Present Illness  Corey [name] is a 66 year old with the following urologic history:  1) Right renal neoplasm: He underwent a CT scan with and without contrast in May 2013 due to an abnormal mass seen on ultrasound evaluation for his kidney stones.  This CT demonstrated a 2.5 cm enhancing lesion in the upper pole of the right kidney concerning for renal cell carcinoma.  He has had microscopic hematuria in the past presumably due to his extensive history of urolithiasis.  There is no family history of kidney cancer. His lesion is about 85% endophytic and located anteriorly in the upper pole.  It abuts the renal collecting system. He has a second minimally complex 1.4 cm lateral cyst off the interpolar region of the right kidney. There are other multiple bilateral renal cysts with none demonstrating worrisome features. There is no regional lymphadenopathy, renal vein involvement, adrenal masses, or other evidence of obvious metastatic disease to the abdomen.   2) Urolithiasis: He has an extensive history of calcium oxalate urolithiasis and has previously been treated by Dr. Etta Grandchild, Dr. Aldean Ast, Dr. Vernie Ammons, and Dr. Earlene Plater. His first stone episode was at age 69 in 9 when he underwent cystoscopy with basket extraction.  He has since undergone numerous procedures including ureteroscopic stone removal in 1990 and 2011 and ESWL in 2001, 2005 x 2, 2007, and 2009 x 2.  He has been noted to have low urine volumes and hypercalciuria and hypocitraturia.  Current medical management: HCTZ, potassium citrate, fluid hydration  3) Prostate cancer screening: He has no family history of prostate cancer. Last PSA: 1.31 (Sep 2011)  4) CKD: He has been noted to have an  elevated creatinine in the past thought to be related to chronic NSAID use with diclofenac. Baseline renal function: Cr 1.1 (May 2013)  ** Medical comorbidities: He has a history of chest pain and left bundle branch block.  He underwent a cardiac evaluation by Dr. Peter [place] in 2011 which included a cardiac catheterization and apparently did not require intervention.  Interval history:  He follows up today in consultation at the request of Dr. Mena Goes to consider minimally invasive nephron sparing surgery for his right renal neoplasm. He has denied any hematuria and has no family history of kidney cancer or renal failure. He has had intermittent bilateral flank pain although has a history of stones and a history of chronic back pain. He notes no modifying factors.   Past Medical History Problems  1. History of  Cardiac Catheterization  (Diagnostic) 2. History of  Esophageal Reflux 530.81 3. History of  Hypertension 401.9 4. History of  Nephrolithiasis V13.01  Surgical History Problems  1. History of  Back Surgery 2. History of  Cystoscopy With Insertion Of Ureteral Stent Left 3. History of  Cystoscopy With Ureteroscopy With Lithotripsy 4. History of  Elbow Surgery 5. History of  Knee Arthroscopy With Debridement Of Articular Cartilage 6. History of  Lithotripsy 7. History of  Lithotripsy 8. History of  Lithotripsy  Current Meds 1. Diclofenac Sodium 75 MG Oral Tablet Delayed Release; Therapy: (Recorded:29May2013) to 2. Hydrochlorothiazide 25 MG Oral Tablet; Take 1 tablet daily; Therapy: 26Jun2013 to  (Evaluate:21Jun2014)  Requested for: 26Jun2013; Last Rx:26Jun2013 3. Hydrocodone-Acetaminophen 10-325 MG Oral Tablet; TAKE ONE-HALF TO ONE  TABLET EVERY  4 HOURS AS NEEDED FOR PAIN; Therapy: 09Oct2012 to 4. Losartan Potassium 100 MG Oral Tablet; Therapy: 27Feb2013 to 5. Metoprolol Succinate ER 50 MG Oral Tablet Extended Release 24 Hour; Therapy:  (Recorded:29May2013) to 6. Metoprolol  Succinate ER 50 MG Oral Tablet Extended Release 24 Hour; Therapy: 25Jun2010 to 7. Nitrostat 0.4 MG Sublingual Tablet Sublingual; PLACE 1 TABLET (0.4 MG TOTAL) UNDER THE  TONGUE EVERY 5 (FIVE) MINUTES; Therapy: 13Jul2012 to 8. OxyCODONE HCl TABS; Therapy: (Recorded:06Nov2012) to 9. Pravastatin Sodium 20 MG Oral Tablet; Therapy: 15Jun2012 to 10. PriLOSEC CPDR; Therapy: (Recorded:27Sep2011) to 11. Tamsulosin HCl 0.4 MG Oral Capsule; TAKE ONE CAPSULE BY MOUTH DAILY; Therapy:   30May2013 to (Evaluate:29Jul2013)  Requested for: 05Jun2013; Last Rx:30May2013 12. TraMADol HCl 50 MG Oral Tablet; TAKE 1 TABLET 4 TIMES DAILY AS NEEDED FOR PAIN;   Therapy: 03Jun2013 to (Evaluate:18Jun2013)  Requested for: 03Jun2013; Last Rx:03Jun2013 13. TraMADol HCl 50 MG Oral Tablet; Therapy: (Recorded:06Nov2012) to  Allergies Medication  1. No Known Drug Allergies  Family History Problems  1. Paternal history of  Adenocarcinoma Of The Pancreas 2. Family history of  Death In The Family Father Deceased at age 33; pancreatic cancer 3. Family history of  Death In The Family Mother Deceased at age 22; old age 34. Family history of  Urologic Disorder V18.7 kidney stones-sister  Social History Problems    Marital History - Currently Married   Never A Smoker   Occupation: Transport planner Denied    History of  Alcohol Use  Review of Systems Constitutional, skin, eye, otolaryngeal, hematologic/lymphatic, cardiovascular, pulmonary, endocrine, musculoskeletal, gastrointestinal, neurological and psychiatric system(s) were reviewed and pertinent findings if present are noted.  Genitourinary: no hematuria.  Musculoskeletal: back pain.    Vitals Vital Signs [Data Includes: Last 1 Day]  23Jul2013 08:11AM  Blood Pressure: 159 / 90 Heart Rate: 73  Physical Exam Constitutional: Well nourished and well developed . No acute distress.  ENT:. The ears and nose are normal in appearance.  Neck: The appearance of the  neck is normal and no neck mass is present.  Pulmonary: No respiratory distress, normal respiratory rhythm and effort and clear bilateral breath sounds.  Cardiovascular: Heart rate and rhythm are normal . No peripheral edema.  Abdomen: The abdomen is soft and nontender. No masses are palpated. No CVA tenderness. No hernias are palpable. No hepatosplenomegaly noted.  Lymphatics: The supraclavicular, femoral and inguinal nodes are not enlarged or tender.  Skin: Normal skin turgor, no visible rash and no visible skin lesions.  Neuro/Psych:. Mood and affect are appropriate.    Results/Data Urine [Data Includes: Last 1 Day]   23Jul2013  COLOR YELLOW   APPEARANCE CLEAR   SPECIFIC GRAVITY 1.025   pH 5.5   GLUCOSE NEG mg/dL  BILIRUBIN NEG   KETONE NEG mg/dL  BLOOD TRACE   PROTEIN NEG mg/dL  UROBILINOGEN 0.2 mg/dL  NITRITE NEG   LEUKOCYTE ESTERASE NEG   SQUAMOUS EPITHELIAL/HPF RARE   WBC 0-2 WBC/hpf  RBC 3-6 RBC/hpf  BACTERIA NONE SEEN   CRYSTALS NONE SEEN   CASTS NONE SEEN     I independently reviewed his CT scan with and without contrast the findings as dictated above. Also reviewed his medical records with findings as outlined above.   Assessment Assessed  1. Nephrolithiasis 592.0 2. Renal Neoplasm Right 239.5  Plan Renal Neoplasm (239.5)  1. Follow-up Schedule Surgery Office  Follow-up  Requested for: 23Jul2013 2. CHEST X-RAY  Requested for: 23Jul2013 3. COMPREHENSIVE METABOLIC PANEL  Done: 23Jul2013 4. VENIPUNCTURE  Done: 23Jul2013  Discussion/Summary  1. Right renal neoplasm concerning for malignancy: He will complete his metastatic evaluation including laboratory studies and chest imaging.   The patient was provided information regarding their renal mass including the relative risk of benign versus malignant pathology and the natural history of renal cell carcinoma and other possible malignancies of the kidney. The role of renal biopsy, laboratory testing, and imaging  studies to further characterize renal masses and/or the presence of metastatic disease were explained. We discussed the role of active surveillance, surgical therapy with both radical nephrectomy and nephron-sparing surgery, and ablative therapy in the treatment of renal masses. In addition, we discussed our goals of providing an accurate diagnosis and oncologic control while maintaining optimal renal function as appropriate based on the size, location, and complexity of their renal mass as well as their co-morbidities.    We have discussed the risks of treatment in detail including but not limited to bleeding, infection, heart attack, stroke, death, venothromoboembolism, cancer recurrence, injury/damage to surrounding organs and structures, urine leak, the possibility of open surgical conversion for patients undergoing minimally invasive surgery, the risk of developing chronic kidney disease and its associated implications, and the potential risk of end stage renal disease possibly necessitating dialysis.   After review of his treatment/management options, he would like to proceed with surgical treatment. I have recommended a right robotic-assisted laparoscopic partial nephrectomy. This will be scheduled for the near future assuming his metastatic evaluation is negative.  2. Urolithiasis: I have recommended repeating a metabolic evaluation in the future to lower his risk for future stone formation.  3. Left renal calculi: He does have fairly large burden left renal calculi. I recommended that he consider undergoing treatment in the future once he recovers from his upcoming surgery for his renal mass.  Cc: Dr. Jerilee Field Dr. Jarome Matin    Verified Results COMPREHENSIVE METABOLIC PANEL1 23Jul2013 09:12AM1 Corey [name]  SPECIMEN TYPE: BLOOD   Test Name Result Flag Reference  GLUCOSE1 110 mg/dL1 H1 70-991  BUN1 16 mg/dL1  6-231  CREATININE1 1.16 mg/dL1  0.50-1.501  SODIUM1 141 mEq/L1   [phone]  POTASSIUM1 3.8 mEq/L1  3.5-5.31  CHLORIDE1 107 mEq/L1  96-1121  CO21 28 mEq/L1  19-321  CALCIUM1 9.2 mg/dL1  8.4-10.51  TOTAL PROTEIN1 6.5 g/dL1  6.0-8.31  ALBUMIN1 4.3 g/dL1  3.5-5.21  AST/SGOT1 18 U/L1  0-371  ALT/SGPT1 21 U/L1  0-531  ALKALINE PHOSPHATASE1 113 U/L1  39-1171  BILIRUBIN, TOTAL1 1.0 mg/dL1  0.3-1.21  Est GFR, African American1 76 mL/min1    Est GFR, NonAfrican American1 66 mL/min1    The estimated GFR is a calculation valid for adults (67 to 66 years old) that uses the CKD-EPI algorithm to adjust for age and sex. It is not to be used for children, pregnant women, hospitalized patients, patients on dialysis, or with rapidly changing kidney function. According to the NKDEP, eGFR >89 is normal, 60-89 shows mild impairment, 30-59 shows moderate impairment, 15-29 shows severe impairment and <15 is ESRD.     1. Amended By: Heloise Purpura; 10/11/2011 1:40 PMEST  SignaturesElectronically signed by : Heloise Purpura, M.D.; Oct 11 2011  1:40PM

## 2011-11-01 ENCOUNTER — Encounter (HOSPITAL_COMMUNITY): Payer: Self-pay | Admitting: *Deleted

## 2011-11-01 ENCOUNTER — Inpatient Hospital Stay (HOSPITAL_COMMUNITY)
Admission: RE | Admit: 2011-11-01 | Discharge: 2011-11-05 | DRG: 657 | Disposition: A | Discharge status: Home / Self Care | Payer: Medicare Other | Source: Ambulatory Visit | Attending: Urology | Admitting: Urology

## 2011-11-01 ENCOUNTER — Encounter (HOSPITAL_COMMUNITY): Payer: Self-pay | Admitting: Anesthesiology

## 2011-11-01 ENCOUNTER — Ambulatory Visit (HOSPITAL_COMMUNITY): Payer: Medicare Other | Admitting: Anesthesiology

## 2011-11-01 ENCOUNTER — Encounter (HOSPITAL_COMMUNITY)
Admission: RE | Disposition: A | Discharge status: Home / Self Care | Payer: Self-pay | Source: Ambulatory Visit | Attending: Urology

## 2011-11-01 DIAGNOSIS — C649 Malignant neoplasm of unspecified kidney, except renal pelvis: Principal | ICD-10-CM | POA: Diagnosis present

## 2011-11-01 DIAGNOSIS — I10 Essential (primary) hypertension: Secondary | ICD-10-CM | POA: Diagnosis present

## 2011-11-01 DIAGNOSIS — Z01812 Encounter for preprocedural laboratory examination: Secondary | ICD-10-CM

## 2011-11-01 DIAGNOSIS — N200 Calculus of kidney: Secondary | ICD-10-CM | POA: Diagnosis not present

## 2011-11-01 DIAGNOSIS — R112 Nausea with vomiting, unspecified: Secondary | ICD-10-CM | POA: Diagnosis not present

## 2011-11-01 DIAGNOSIS — K219 Gastro-esophageal reflux disease without esophagitis: Secondary | ICD-10-CM | POA: Diagnosis present

## 2011-11-01 DIAGNOSIS — K560 Paralytic ileus: Secondary | ICD-10-CM | POA: Diagnosis not present

## 2011-11-01 DIAGNOSIS — D4100 Neoplasm of uncertain behavior of unspecified kidney: Secondary | ICD-10-CM | POA: Diagnosis not present

## 2011-11-01 DIAGNOSIS — D4959 Neoplasm of unspecified behavior of other genitourinary organ: Secondary | ICD-10-CM | POA: Diagnosis not present

## 2011-11-01 LAB — BASIC METABOLIC PANEL
BUN: 14 mg/dL (ref 6–23)
Calcium: 8.4 mg/dL (ref 8.4–10.5)
GFR calc non Af Amer: 69 mL/min — ABNORMAL LOW (ref >90)
Glucose, Bld: 124 mg/dL — ABNORMAL HIGH (ref 70–99)
Sodium: 135 mEq/L (ref 135–145)

## 2011-11-01 LAB — HEMOGLOBIN AND HEMATOCRIT, BLOOD: HCT: 38.9 % — ABNORMAL LOW (ref 39.0–52.0)

## 2011-11-01 LAB — TYPE AND SCREEN
ABO/RH(D): A POS
Antibody Screen: NEGATIVE

## 2011-11-01 LAB — ABO/RH: ABO/RH(D): A POS

## 2011-11-01 SURGERY — ROBOTIC ASSITED PARTIAL NEPHRECTOMY
Anesthesia: General | Laterality: Right | Wound class: Clean

## 2011-11-01 MED ORDER — HYDROMORPHONE HCL PF 1 MG/ML IJ SOLN
INTRAMUSCULAR | Status: AC
  Filled 2011-11-01: qty 1

## 2011-11-01 MED ORDER — ONDANSETRON HCL 4 MG/2ML IJ SOLN
INTRAMUSCULAR | Status: DC | PRN
  Administered 2011-11-01: 4 mg via INTRAVENOUS

## 2011-11-01 MED ORDER — STERILE WATER FOR IRRIGATION IR SOLN
Status: DC | PRN
  Administered 2011-11-01: 3000 mL

## 2011-11-01 MED ORDER — MIDAZOLAM HCL 5 MG/5ML IJ SOLN
INTRAMUSCULAR | Status: DC | PRN
  Administered 2011-11-01: 2 mg via INTRAVENOUS

## 2011-11-01 MED ORDER — TAMSULOSIN HCL 0.4 MG PO CAPS
0.4000 mg | ORAL_CAPSULE | Freq: Every day | ORAL | Status: DC
  Administered 2011-11-01 – 2011-11-03 (×3): 0.4 mg via ORAL
  Filled 2011-11-01 (×5): qty 1

## 2011-11-01 MED ORDER — LOSARTAN POTASSIUM 50 MG PO TABS
100.0000 mg | ORAL_TABLET | Freq: Every morning | ORAL | Status: DC
  Administered 2011-11-02 – 2011-11-04 (×3): 100 mg via ORAL
  Filled 2011-11-01 (×4): qty 2

## 2011-11-01 MED ORDER — LACTATED RINGERS IR SOLN
Status: DC | PRN
  Administered 2011-11-01: 1000 mL

## 2011-11-01 MED ORDER — OXYCODONE HCL 5 MG PO TABS: 5.0000 mg | ORAL_TABLET | Freq: Once | ORAL | Status: DC | PRN

## 2011-11-01 MED ORDER — DEXAMETHASONE SODIUM PHOSPHATE 10 MG/ML IJ SOLN
INTRAMUSCULAR | Status: DC | PRN
  Administered 2011-11-01: 10 mg via INTRAVENOUS

## 2011-11-01 MED ORDER — MEPERIDINE HCL 50 MG/ML IJ SOLN
6.2500 mg | INTRAMUSCULAR | Status: DC | PRN
  Administered 2011-11-01: 12.5 mg via INTRAVENOUS

## 2011-11-01 MED ORDER — ACETAMINOPHEN 10 MG/ML IV SOLN
INTRAVENOUS | Status: AC
  Filled 2011-11-01: qty 100

## 2011-11-01 MED ORDER — ROCURONIUM BROMIDE 100 MG/10ML IV SOLN
INTRAVENOUS | Status: DC | PRN
  Administered 2011-11-01: 10 mg via INTRAVENOUS
  Administered 2011-11-01: 40 mg via INTRAVENOUS
  Administered 2011-11-01: 10 mg via INTRAVENOUS

## 2011-11-01 MED ORDER — BUPIVACAINE LIPOSOME 1.3 % IJ SUSP
20.0000 mL | INTRAMUSCULAR | Status: AC
  Administered 2011-11-01: 20 mL
  Filled 2011-11-01: qty 20

## 2011-11-01 MED ORDER — ACETAMINOPHEN 10 MG/ML IV SOLN: 1000.0000 mg | Freq: Once | INTRAVENOUS | Status: DC | PRN

## 2011-11-01 MED ORDER — OXYCODONE HCL 5 MG/5ML PO SOLN
5.0000 mg | Freq: Once | ORAL | Status: DC | PRN
  Filled 2011-11-01: qty 5

## 2011-11-01 MED ORDER — ACETAMINOPHEN 10 MG/ML IV SOLN
1000.0000 mg | Freq: Four times a day (QID) | INTRAVENOUS | Status: AC
  Administered 2011-11-01 – 2011-11-02 (×4): 1000 mg via INTRAVENOUS
  Filled 2011-11-01 (×4): qty 100

## 2011-11-01 MED ORDER — METOPROLOL SUCCINATE ER 100 MG PO TB24
100.0000 mg | ORAL_TABLET | Freq: Every morning | ORAL | Status: DC
  Administered 2011-11-02 – 2011-11-04 (×3): 100 mg via ORAL
  Filled 2011-11-01 (×4): qty 1

## 2011-11-01 MED ORDER — ACETAMINOPHEN 10 MG/ML IV SOLN
INTRAVENOUS | Status: DC | PRN
  Administered 2011-11-01: 1000 mg via INTRAVENOUS

## 2011-11-01 MED ORDER — DIPHENHYDRAMINE HCL 12.5 MG/5ML PO ELIX: 12.5000 mg | ORAL_SOLUTION | Freq: Four times a day (QID) | ORAL | Status: DC | PRN

## 2011-11-01 MED ORDER — ONDANSETRON HCL 4 MG/2ML IJ SOLN
4.0000 mg | INTRAMUSCULAR | Status: DC | PRN
  Administered 2011-11-02 – 2011-11-03 (×2): 4 mg via INTRAVENOUS
  Filled 2011-11-01 (×3): qty 2

## 2011-11-01 MED ORDER — DOXAZOSIN MESYLATE 1 MG PO TABS
1.0000 mg | ORAL_TABLET | Freq: Every day | ORAL | Status: DC
  Administered 2011-11-01 – 2011-11-04 (×4): 1 mg via ORAL
  Filled 2011-11-01 (×5): qty 1

## 2011-11-01 MED ORDER — MEPERIDINE HCL 50 MG/ML IJ SOLN
INTRAMUSCULAR | Status: AC
  Filled 2011-11-01: qty 1

## 2011-11-01 MED ORDER — CEFAZOLIN SODIUM 1-5 GM-% IV SOLN
1.0000 g | Freq: Three times a day (TID) | INTRAVENOUS | Status: AC
  Administered 2011-11-01 – 2011-11-02 (×2): 1 g via INTRAVENOUS
  Filled 2011-11-01 (×2): qty 50

## 2011-11-01 MED ORDER — DOCUSATE SODIUM 100 MG PO CAPS
100.0000 mg | ORAL_CAPSULE | Freq: Two times a day (BID) | ORAL | Status: DC
Start: 2011-11-01 — End: 2011-11-05
  Administered 2011-11-01 – 2011-11-04 (×8): 100 mg via ORAL
  Filled 2011-11-01 (×10): qty 1

## 2011-11-01 MED ORDER — MANNITOL 25 % IV SOLN
25.0000 g | Freq: Once | INTRAVENOUS | Status: AC
  Administered 2011-11-01 (×2): 12.5 g via INTRAVENOUS
  Filled 2011-11-01: qty 100

## 2011-11-01 MED ORDER — SIMVASTATIN 10 MG PO TABS
10.0000 mg | ORAL_TABLET | Freq: Every day | ORAL | Status: DC
  Administered 2011-11-01 – 2011-11-04 (×4): 10 mg via ORAL
  Filled 2011-11-01 (×5): qty 1

## 2011-11-01 MED ORDER — LIDOCAINE HCL (CARDIAC) 20 MG/ML IV SOLN
INTRAVENOUS | Status: DC | PRN
  Administered 2011-11-01: 100 mg via INTRAVENOUS

## 2011-11-01 MED ORDER — PANTOPRAZOLE SODIUM 40 MG PO TBEC
40.0000 mg | DELAYED_RELEASE_TABLET | Freq: Every day | ORAL | Status: DC
  Administered 2011-11-02 – 2011-11-03 (×2): 40 mg via ORAL
  Filled 2011-11-01 (×4): qty 1

## 2011-11-01 MED ORDER — DIPHENHYDRAMINE HCL 50 MG/ML IJ SOLN
12.5000 mg | Freq: Four times a day (QID) | INTRAMUSCULAR | Status: DC | PRN
  Administered 2011-11-01: 12.5 mg via INTRAVENOUS
  Filled 2011-11-01: qty 1

## 2011-11-01 MED ORDER — LACTATED RINGERS IV SOLN
INTRAVENOUS | Status: DC | PRN
  Administered 2011-11-01 (×3): via INTRAVENOUS

## 2011-11-01 MED ORDER — NITROGLYCERIN 0.4 MG SL SUBL: 0.4000 mg | SUBLINGUAL_TABLET | SUBLINGUAL | Status: DC | PRN

## 2011-11-01 MED ORDER — EPHEDRINE SULFATE 50 MG/ML IJ SOLN
INTRAMUSCULAR | Status: DC | PRN
  Administered 2011-11-01: 5 mg via INTRAVENOUS
  Administered 2011-11-01: 10 mg via INTRAVENOUS

## 2011-11-01 MED ORDER — HYDROMORPHONE HCL PF 1 MG/ML IJ SOLN
0.2500 mg | INTRAMUSCULAR | Status: DC | PRN
  Administered 2011-11-01 (×3): 0.5 mg via INTRAVENOUS

## 2011-11-01 MED ORDER — PROMETHAZINE HCL 25 MG/ML IJ SOLN: 6.2500 mg | INTRAMUSCULAR | Status: DC | PRN

## 2011-11-01 MED ORDER — CEFAZOLIN SODIUM-DEXTROSE 2-3 GM-% IV SOLR
2.0000 g | INTRAVENOUS | Status: AC
  Administered 2011-11-01: 2 g via INTRAVENOUS

## 2011-11-01 MED ORDER — NEOSTIGMINE METHYLSULFATE 1 MG/ML IJ SOLN
INTRAMUSCULAR | Status: DC | PRN
Start: 2011-11-01 — End: 2011-11-01
  Administered 2011-11-01: 4 mg via INTRAVENOUS

## 2011-11-01 MED ORDER — PROPOFOL 10 MG/ML IV BOLUS
INTRAVENOUS | Status: DC | PRN
  Administered 2011-11-01: 200 mg via INTRAVENOUS

## 2011-11-01 MED ORDER — SUCCINYLCHOLINE CHLORIDE 20 MG/ML IJ SOLN
INTRAMUSCULAR | Status: DC | PRN
  Administered 2011-11-01: 100 mg via INTRAVENOUS

## 2011-11-01 MED ORDER — FENTANYL CITRATE 0.05 MG/ML IJ SOLN
INTRAMUSCULAR | Status: DC | PRN
  Administered 2011-11-01: 50 ug via INTRAVENOUS
  Administered 2011-11-01: 100 ug via INTRAVENOUS
  Administered 2011-11-01: 50 ug via INTRAVENOUS

## 2011-11-01 MED ORDER — MORPHINE SULFATE 2 MG/ML IJ SOLN
2.0000 mg | INTRAMUSCULAR | Status: DC | PRN
  Administered 2011-11-01 (×2): 4 mg via INTRAVENOUS
  Administered 2011-11-01 – 2011-11-02 (×3): 2 mg via INTRAVENOUS
  Administered 2011-11-02: 4 mg via INTRAVENOUS
  Filled 2011-11-01: qty 2
  Filled 2011-11-01 (×2): qty 1
  Filled 2011-11-01 (×2): qty 2
  Filled 2011-11-01: qty 1
  Filled 2011-11-01: qty 2

## 2011-11-01 MED ORDER — GLYCOPYRROLATE 0.2 MG/ML IJ SOLN
INTRAMUSCULAR | Status: DC | PRN
  Administered 2011-11-01: .6 mg via INTRAVENOUS

## 2011-11-01 MED ORDER — ZOLPIDEM TARTRATE 5 MG PO TABS
5.0000 mg | ORAL_TABLET | Freq: Every evening | ORAL | Status: DC | PRN
  Administered 2011-11-02: 5 mg via ORAL
  Filled 2011-11-01: qty 1

## 2011-11-01 MED ORDER — CEFAZOLIN SODIUM-DEXTROSE 2-3 GM-% IV SOLR
INTRAVENOUS | Status: AC
  Filled 2011-11-01: qty 50

## 2011-11-01 MED ORDER — DEXTROSE-NACL 5-0.45 % IV SOLN
INTRAVENOUS | Status: DC
  Administered 2011-11-01 – 2011-11-02 (×5): via INTRAVENOUS

## 2011-11-01 MED ORDER — AMLODIPINE BESYLATE 5 MG PO TABS
5.0000 mg | ORAL_TABLET | Freq: Every morning | ORAL | Status: DC
  Administered 2011-11-02 – 2011-11-04 (×3): 5 mg via ORAL
  Filled 2011-11-01 (×4): qty 1

## 2011-11-01 SURGICAL SUPPLY — 65 items
ADH SKN CLS APL DERMABOND .7 (GAUZE/BANDAGES/DRESSINGS) ×2
APL ESCP 34 STRL LF DISP (HEMOSTASIS) ×1
APPLICATOR SURGIFLO ENDO (HEMOSTASIS) ×2 IMPLANT
BAG SPEC RTRVL LRG 6X4 10 (ENDOMECHANICALS) ×1
CANNULA SEAL DVNC (CANNULA) ×1 IMPLANT
CANNULA SEALS DA VINCI (CANNULA) ×1
CHLORAPREP W/TINT 26ML (MISCELLANEOUS) ×2 IMPLANT
CLIP LIGATING HEM O LOK PURPLE (MISCELLANEOUS) ×2 IMPLANT
CLIP LIGATING HEMO O LOK GREEN (MISCELLANEOUS) ×4 IMPLANT
CLOTH BEACON ORANGE TIMEOUT ST (SAFETY) ×2 IMPLANT
CORDS BIPOLAR (ELECTRODE) ×2 IMPLANT
COVER SURGICAL LIGHT HANDLE (MISCELLANEOUS) ×2 IMPLANT
COVER TIP SHEARS 8 DVNC (MISCELLANEOUS) ×1 IMPLANT
COVER TIP SHEARS 8MM DA VINCI (MISCELLANEOUS) ×1
DECANTER SPIKE VIAL GLASS SM (MISCELLANEOUS) IMPLANT
DERMABOND ADVANCED (GAUZE/BANDAGES/DRESSINGS) ×2
DERMABOND ADVANCED .7 DNX12 (GAUZE/BANDAGES/DRESSINGS) ×2 IMPLANT
DRAIN CHANNEL 15F RND FF 3/16 (WOUND CARE) ×2 IMPLANT
DRAPE INCISE IOBAN 66X45 STRL (DRAPES) ×2 IMPLANT
DRAPE LAPAROSCOPIC ABDOMINAL (DRAPES) ×2 IMPLANT
DRAPE LG THREE QUARTER DISP (DRAPES) ×4 IMPLANT
DRAPE TABLE BACK 44X90 PK DISP (DRAPES) ×2 IMPLANT
DRAPE WARM FLUID 44X44 (DRAPE) ×2 IMPLANT
DRESSING SURGICEL FIBRLLR 1X2 (HEMOSTASIS) IMPLANT
DRSG SURGICEL FIBRILLAR 1X2 (HEMOSTASIS)
ELECT REM PT RETURN 9FT ADLT (ELECTROSURGICAL) ×4
ELECTRODE REM PT RTRN 9FT ADLT (ELECTROSURGICAL) ×2 IMPLANT
EVACUATOR SILICONE 100CC (DRAIN) ×2 IMPLANT
GAUZE VASELINE 3X9 (GAUZE/BANDAGES/DRESSINGS) IMPLANT
GLOVE BIO SURGEON STRL SZ 6.5 (GLOVE) ×2 IMPLANT
GLOVE BIOGEL M STRL SZ7.5 (GLOVE) ×4 IMPLANT
GOWN STRL NON-REIN LRG LVL3 (GOWN DISPOSABLE) ×6 IMPLANT
GOWN STRL REIN XL XLG (GOWN DISPOSABLE) ×2 IMPLANT
HEMOSTAT SURGICEL 4X8 (HEMOSTASIS) IMPLANT
KIT ACCESSORY DA VINCI DISP (KITS) ×1
KIT ACCESSORY DVNC DISP (KITS) ×1 IMPLANT
KIT BASIN OR (CUSTOM PROCEDURE TRAY) ×2 IMPLANT
NS IRRIG 1000ML POUR BTL (IV SOLUTION) IMPLANT
PENCIL BUTTON HOLSTER BLD 10FT (ELECTRODE) ×2 IMPLANT
POSITIONER SURGICAL ARM (MISCELLANEOUS) IMPLANT
POUCH SPECIMEN RETRIEVAL 10MM (ENDOMECHANICALS) ×2 IMPLANT
SET TUBE IRRIG SUCTION NO TIP (IRRIGATION / IRRIGATOR) IMPLANT
SOLUTION ANTI FOG 6CC (MISCELLANEOUS) ×2 IMPLANT
SOLUTION ELECTROLUBE (MISCELLANEOUS) ×2 IMPLANT
SPONGE LAP 18X18 X RAY DECT (DISPOSABLE) IMPLANT
SURGIFLO W/THROMBIN 8M KIT (HEMOSTASIS) ×2 IMPLANT
SUT ETHILON 3 0 PS 1 (SUTURE) ×2 IMPLANT
SUT MNCRL AB 4-0 PS2 18 (SUTURE) ×4 IMPLANT
SUT V-LOC BARB 180 2/0GR6 GS22 (SUTURE) ×6
SUT V-LOC BARB 180 2/0GR9 GS23 (SUTURE)
SUT VIC AB 0 CT1 27 (SUTURE) ×2
SUT VIC AB 0 CT1 27XBRD ANTBC (SUTURE) ×1 IMPLANT
SUT VICRYL 0 UR6 27IN ABS (SUTURE) ×2 IMPLANT
SUT VLOC BARB 180 ABS3/0GR12 (SUTURE) ×2
SUTURE V-LC BRB 180 2/0GR6GS22 (SUTURE) ×3 IMPLANT
SUTURE V-LC BRB 180 2/0GR9GS23 (SUTURE) IMPLANT
SUTURE VLOC BRB 180 ABS3/0GR12 (SUTURE) ×1 IMPLANT
SYR BULB IRRIGATION 50ML (SYRINGE) IMPLANT
TOWEL OR NON WOVEN STRL DISP B (DISPOSABLE) ×2 IMPLANT
TRAY FOLEY CATH 14FRSI W/METER (CATHETERS) ×2 IMPLANT
TRAY LAP CHOLE (CUSTOM PROCEDURE TRAY) ×2 IMPLANT
TROCAR ENDOPATH XCEL 12X100 BL (ENDOMECHANICALS) ×2 IMPLANT
TROCAR XCEL 12X100 BLDLESS (ENDOMECHANICALS) ×2 IMPLANT
TUBING INSUFFLATION 10FT LAP (TUBING) ×2 IMPLANT
WATER STERILE IRR 1500ML POUR (IV SOLUTION) IMPLANT

## 2011-11-01 NOTE — Anesthesia Preprocedure Evaluation (Addendum)
Anesthesia Evaluation  Patient identified by MRN, date of birth, ID band Patient awake    Reviewed: Allergy & Precautions, H&P , NPO status , Patient's Chart, lab work & pertinent test results, reviewed documented beta blocker date and time   Airway Mallampati: II TM Distance: >3 FB Neck ROM: Full    Dental  (+) Teeth Intact and Dental Advisory Given   Pulmonary  breath sounds clear to auscultation  Pulmonary exam normal       Cardiovascular Exercise Tolerance: Good hypertension, Pt. on medications + CAD - Valvular Problems/MurmursRhythm:Regular Rate:Normal  Non occlusive CAD, LBBB, no valvular abnormalities on Echo   Neuro/Psych negative neurological ROS     GI/Hepatic Neg liver ROS, GERD-  Medicated and Controlled,  Endo/Other  negative endocrine ROS  Renal/GU Renal disease     Musculoskeletal   Abdominal (+) - obese,  Abdomen: soft.    Peds  Hematology negative hematology ROS (+)   Anesthesia Other Findings   Reproductive/Obstetrics                          Anesthesia Physical Anesthesia Plan  ASA: II  Anesthesia Plan: General   Post-op Pain Management:    Induction: Intravenous  Airway Management Planned: Oral ETT  Additional Equipment:   Intra-op Plan:   Post-operative Plan: Extubation in OR  Informed Consent: I have reviewed the patients History and Physical, chart, labs and discussed the procedure including the risks, benefits and alternatives for the proposed anesthesia with the patient or authorized representative who has indicated his/her understanding and acceptance.   Dental advisory given  Plan Discussed with: CRNA and Surgeon  Anesthesia Plan Comments:         Anesthesia Quick Evaluation

## 2011-11-01 NOTE — Preoperative (Signed)
Beta Blockers   Reason not to administer Beta Blockers:Hold beta blocker due to other 

## 2011-11-01 NOTE — Plan of Care (Signed)
Problem: Diagnosis - Type of Surgery Goal: General Surgical Patient Education (See Patient Education module for education specifics) Outcome: Completed/Met Date Met:  11/01/11 Patient understands process.

## 2011-11-01 NOTE — Anesthesia Postprocedure Evaluation (Signed)
Anesthesia Post Note  Patient: Corey [name]  Procedure(s) Performed: Procedure(s) (LRB): ROBOTIC ASSITED PARTIAL NEPHRECTOMY (Right)  Anesthesia type: General  Patient location: PACU  Post pain: Pain level controlled  Post assessment: Post-op Vital signs reviewed  Last Vitals: BP 140/75  Pulse 83  Temp 36.4 C (Oral)  Resp 15  SpO2 100%  Post vital signs: Reviewed  Level of consciousness: sedated  Complications: No apparent anesthesia complications

## 2011-11-01 NOTE — Interval H&P Note (Signed)
History and Physical Interval Note:  11/01/2011 11:33 AM  Corey [name]  has presented today for surgery, with the diagnosis of RIGHT RENAL NEOPLASM  The various methods of treatment have been discussed with the patient and family. After consideration of risks, benefits and other options for treatment, the patient has consented to  Procedure(s) (LRB): ROBOTIC ASSITED PARTIAL NEPHRECTOMY (Right) as a surgical intervention .  The patient's history has been reviewed, patient examined, no change in status, stable for surgery.  I have reviewed the patient's chart and labs.  Questions were answered to the patient's satisfaction.     Katie Faraone,LES

## 2011-11-01 NOTE — Transfer of Care (Signed)
Immediate Anesthesia Transfer of Care Note  Patient: Corey [name]  Procedure(s) Performed: Procedure(s) (LRB): ROBOTIC ASSITED PARTIAL NEPHRECTOMY (Right)  Patient Location: PACU  Anesthesia Type: General  Level of Consciousness: awake, sedated and patient cooperative  Airway & Oxygen Therapy: Patient Spontanous Breathing and Patient connected to face mask oxygen  Post-op Assessment: Report given to PACU RN and Post -op Vital signs reviewed and stable  Post vital signs: Reviewed and stable  Complications: No apparent anesthesia complications

## 2011-11-01 NOTE — Op Note (Signed)
Preoperative diagnosis: Right renal neoplasm  Postoperative diagnosis: RIght renal neoplasm  Procedure:  1. Right robotic-assisted laparoscopic partial nephrectomy 2. Intraoperative renal ultrasonography  Surgeon: Corey [name]. M.D.  Assistant(s): Pecola Leisure, PA-C  Anesthesia: General  Complications: None  EBL: 200 mL  IVF:  2700 mL crystalloid  Specimens: 1. Right renal neoplasm  Disposition of specimens: Pathology  Intraoperative findings:       1. Warm renal ischemia time: 25 minutes       2. Intraoperative renal ultrasound findings: There was confirmed to be a 2.63 cm heterogeneous mass that was almost completely endophytic on the anterior interpolar right kidney.  Drains: 1. # 15 Blake perinephric drain  Indication:  Corey [name] is a 66 y.o. year old patient with a right renal neoplasm as well as a second lateral right complex renal cyst.  After a thorough review of the management options for their renal mass, they elected to proceed with surgical treatment and the above procedure.  We have discussed the potential benefits and risks of the procedure, side effects of the proposed treatment, the likelihood of the patient achieving the goals of the procedure, and any potential problems that might occur during the procedure or recuperation. Informed consent has been obtained.   Description of procedure:  The patient was taken to the operating room and a general anesthetic was administered. The patient was given preoperative antibiotics, placed in the right modified flank position with care to pad all potential pressure points, and prepped and draped in the usual sterile fashion. Next a preoperative timeout was performed.  A site was selected on the right side of the umbilicus for placement of the camera port. This was placed using a standard open Hassan technique which allowed entry into the peritoneal cavity under direct vision and without difficulty. A  12 mm port was placed and a pneumoperitoneum established. The camera was then used to inspect the abdomen and there was no evidence of any intra-abdominal injuries or other abnormalities. The remaining abdominal ports were then placed. 8 mm robotic ports were placed in the right upper quadrant, right lower quadrant, and far right lateral abdominal wall. A 12 mm port was placed in the upper midline for laparoscopic assistance. All ports were placed under direct vision without difficulty. The surgical cart was then docked.   Utilizing the cautery scissors, the white line of Toldt was incised allowing the colon to be mobilized medially and the plane between the mesocolon and the anterior layer of Gerota's fascia to be developed and the kidney to be exposed.  The ureter and gonadal vein were identified inferiorly and the ureter was lifted anteriorly off the psoas muscle.  Dissection proceeded superiorly along the gonadal vein until the renal vein was identified.  The renal hilum was then carefully isolated with a combination of blunt and sharp dissection allowing the renal arterial and venous structures to be separated and isolated in preparation for renal hilar vessel clamping.  12.5 g of IV mannitol was then administered.   Attention turned to the kidney and the perinephric fat surrounding the renal mass was removed and the kidney was mobilized sufficiently for exposure and resection of the renal mass. The anterior mass was not easily identified due to its endophytic nature.   Intraoperative renal ultrasonography was utilized with the laparoscopic ultrasound probe to identify the renal tumor and identify the tumor margins.  This confirmed a 2.63 cm heterogeneous but mostly solid mass and the tumor margins were  marked.   A second cystic lesion was identified laterally and although it was not very abnormal, it did not appear simple.  It was therefore decided to proceed with excision of this mass as well.  Once  the renal masses were properly isolated, preparations were made for resection of the tumor.  Reconstructive sutures were placed into the abdomen for the renorrhaphy portion of the procedure.  The renal artery was then clamped with bulldog clamps.  The tumor was then excised with cold scissor dissection along with an adequate visible gross margin of normal renal parenchyma. This required deep dissection with entry into the collecting system and dissection near the main renal vein. The tumor appeared to be excised without any gross violation of the tumor. The renal collecting system was entered during removal of the tumor.  A running 2-0 V-lock suture was then brought through the capsule of the kidney and run along the base of the renal defect to provide hemostasis and close any entry into the renal collecting system if present. Weck clips were used to secure this suture outside the renal capsule at the proximal and distal ends. Floseal was then placed into the renal defect and the renal parenchyma was closed with a 2-0 V-lock horizontal mattress capsular suture which resulted in excellent compression of the renal defect.  The lateral complex renal cyst was similarly removed.  It was very exophytic with only a small amount of normal parenchyma needing removal.  A 2-0 running V-lock suture was used to provide hemostasis.  The bulldog clamps were then removed from the renal artery and an additional 12.5 g of IV mannitol was administered. Total warm renal ischemia time was 25 minutes. The renal tumor resection site was examined. Hemostasis appeared adequate.   The kidney was placed back into its normal anatomic position and covered with perinephric fat as needed.  A # 15 Blake drain was then brought through the lateral lower port site and positioned in the perinephric space.  It was secured to the skin with a nylon suture. The surgical cart was undocked.  The renal tumor specimen was removed intact within an  endopouch retrieval bag via the camera port sites.  The camera port site and the other 12 mm port site were then closed at the fascial level with 0-vicryl suture.  All other laparoscopic/robotic ports were removed under direct vision and the pneumoperitoneum let down with inspection of the operative field performed and hemostasis again confirmed. All incision sites were then injected with local anesthetic and reapproximated at the skin level with 4-0 monocryl subcuticular closures.  Dermabond was applied to the skin.  The patient tolerated the procedure well and without complications.  The patient was able to be extubated and transferred to the recovery unit in satisfactory condition.  Corey Bruins MD

## 2011-11-02 LAB — BASIC METABOLIC PANEL
BUN: 15 mg/dL (ref 6–23)
CO2: 26 mEq/L (ref 19–32)
Chloride: 102 mEq/L (ref 96–112)
Creatinine, Ser: 1.18 mg/dL (ref 0.50–1.35)
Glucose, Bld: 156 mg/dL — ABNORMAL HIGH (ref 70–99)

## 2011-11-02 LAB — HEMOGLOBIN AND HEMATOCRIT, BLOOD: Hemoglobin: 14.1 g/dL (ref 13.0–17.0)

## 2011-11-02 MED ORDER — HYDROCODONE-ACETAMINOPHEN 5-325 MG PO TABS
1.0000 | ORAL_TABLET | Freq: Four times a day (QID) | ORAL | Status: DC | PRN
  Administered 2011-11-02: 2 via ORAL
  Filled 2011-11-02: qty 2

## 2011-11-02 MED ORDER — HYDROCODONE-ACETAMINOPHEN 5-325 MG PO TABS: 1.0000 | ORAL_TABLET | Freq: Four times a day (QID) | ORAL | Status: DC | PRN

## 2011-11-02 MED ORDER — DOCUSATE SODIUM 100 MG PO CAPS: 100.0000 mg | ORAL_CAPSULE | Freq: Two times a day (BID) | ORAL | Status: DC

## 2011-11-02 MED ORDER — BISACODYL 10 MG RE SUPP
10.0000 mg | Freq: Once | RECTAL | Status: AC
  Administered 2011-11-02: 10 mg via RECTAL
  Filled 2011-11-02: qty 1

## 2011-11-02 MED ORDER — TRAMADOL HCL 50 MG PO TABS
50.0000 mg | ORAL_TABLET | Freq: Four times a day (QID) | ORAL | Status: DC
  Administered 2011-11-02 – 2011-11-03 (×2): 50 mg via ORAL
  Filled 2011-11-02 (×9): qty 1

## 2011-11-02 MED ORDER — TRAMADOL HCL 50 MG PO TABS: 50.0000 mg | ORAL_TABLET | Freq: Four times a day (QID) | ORAL | Status: DC

## 2011-11-02 NOTE — Progress Notes (Signed)
   Patient ID: Corey [name], male   DOB: 04/20/1945, 66 y.o.   MRN: [id number]  Pt doing well.  He did get dizzy with hydrocodone.  Will try tramadol which he takes at home.  Still no flatus and having some lower abdominal cramping.  Will check JP Cr in am.  Hope to d/c home tomorrow if doing well.

## 2011-11-02 NOTE — Progress Notes (Signed)
1 Day Post-Op Subjective: Patient reports tolerating PO and pain control good.  He does have some soreness.  He denies N/V.  Objective: Vital signs in last 24 hours: Temp:  [97.4 F (36.3 C)-97.8 F (36.6 C)] 97.4 F (36.3 C) (08/16 0610) Pulse Rate:  [83-99] 90  (08/16 0610) Resp:  [15-16] 16  (08/16 0610) BP: (132-168)/(75-92) 144/78 mmHg (08/16 0610) SpO2:  [92 %-100 %] 96 % (08/16 0610) Weight:  [88.5 kg (195 lb 1.7 oz)] 88.5 kg (195 lb 1.7 oz) (08/15 1623)  Intake/Output from previous day: 08/15 0701 - 08/16 0700 In: 3882.5 [P.O.:120; I.V.:3612.5; IV Piggyback:150] Out: 2567 [Urine:2100; Drains:267; Blood:200] Intake/Output this shift: Total I/O In: -  Out: 2900 [Urine:2900]  Physical Exam:  General:alert, cooperative and no distress Cardiac: RRR Lungs: decreased BS bases GI: soft; slightly tender; mild distention; +BS Incisions: C/D/I Foley: clear urine JP:  in place with serosanguinous drainage   Lab Results:  Basename 11/02/11 0423 11/01/11 1427  HGB 14.1 13.9  HCT 39.0 38.9*   BMET  Basename 11/02/11 0423 11/01/11 1427  NA 136 135  K 4.0 3.8  CL 102 101  CO2 26 24  GLUCOSE 156* 124*  BUN 15 14  CREATININE 1.18 1.09  CALCIUM 8.4 8.4   No results found for this basename: LABPT:3,INR:3 in the last 72 hours No results found for this basename: LABURIN:1 in the last 72 hours Results for orders placed during the hospital encounter of 10/22/11  SURGICAL PCR SCREEN     Status: Abnormal   Collection Time   10/22/11  1:50 PM      Component Value Range Status Comment   MRSA, PCR NEGATIVE  NEGATIVE Final    Staphylococcus aureus POSITIVE (*) NEGATIVE Final     Studies/Results: No results found.  Assessment/Plan: 1 Day Post-Op Procedure(s) (LRB): ROBOTIC ASSITED PARTIAL NEPHRECTOMY (Right)  Doing well  Ambulate  I.S  D/c foley  Advance diet  Dulcolax supp  Transition to PO pain meds   LOS: 1 day   YARBROUGH,Kama Cammarano G. 11/02/2011, 9:29  AM

## 2011-11-02 NOTE — Progress Notes (Signed)
   CARE MANAGEMENT NOTE 11/02/2011  Patient:  Corey [name], Corey [name]   Account Number:  [id number]  Date Initiated:  11/02/2011  Documentation initiated by:  Jiles Crocker  Subjective/Objective Assessment:   ADMITTED FOR Right robotic-assisted laparoscopic partial nephrectomy     Action/Plan:   PCP: Dr. Jarome Matin  INDEPENDENT PRIOR TO ADMISSION, LIVES WITH SPOUSE   Anticipated DC Date:  11/05/2011   Anticipated DC Plan:  HOME/SELF CARE           Status of service:  In process, will continue to follow Medicare Important Message given?  NA - LOS <3 / Initial given by admissions (If response is "NO", the following Medicare IM given date fields will be blank) Date Medicare IM given:    Per UR Regulation:  Reviewed for med. necessity/level of care/duration of stay  Comments:  11/02/2011- B Macalister Arnaud RN, Shari Prows

## 2011-11-03 LAB — CREATININE, FLUID (PLEURAL, PERITONEAL, JP DRAINAGE)

## 2011-11-03 MED ORDER — METOPROLOL TARTRATE 1 MG/ML IV SOLN
5.0000 mg | INTRAVENOUS | Status: DC | PRN
  Administered 2011-11-03: 5 mg via INTRAVENOUS
  Filled 2011-11-03: qty 5

## 2011-11-03 MED ORDER — MAGNESIUM HYDROXIDE 400 MG/5ML PO SUSP
5.0000 mL | ORAL | Status: DC | PRN
  Administered 2011-11-03: 5 mL via ORAL
  Filled 2011-11-03: qty 30

## 2011-11-03 MED ORDER — FENTANYL CITRATE 0.05 MG/ML IJ SOLN
50.0000 ug | INTRAMUSCULAR | Status: DC | PRN
  Administered 2011-11-03 (×2): 50 ug via INTRAVENOUS
  Filled 2011-11-03 (×2): qty 2

## 2011-11-03 MED ORDER — BISACODYL 10 MG RE SUPP
10.0000 mg | Freq: Two times a day (BID) | RECTAL | Status: DC
  Administered 2011-11-03 – 2011-11-04 (×3): 10 mg via RECTAL
  Filled 2011-11-03 (×3): qty 1

## 2011-11-03 MED ORDER — MORPHINE SULFATE 15 MG PO TABS
15.0000 mg | ORAL_TABLET | ORAL | Status: DC | PRN
  Administered 2011-11-04: 15 mg via ORAL
  Filled 2011-11-03 (×2): qty 1

## 2011-11-03 NOTE — Progress Notes (Signed)
Patient feeling better after receiving pain medication and blood pressure is decreasing.

## 2011-11-03 NOTE — Progress Notes (Addendum)
Patient vomiting.  States feels bloated, terrible.  Requesting that I paged the Md because of his inability to eat.  States he had a very small BM, white in color.

## 2011-11-03 NOTE — Progress Notes (Signed)
Patient complaining of heart burn and nausea.  Called Md, order received.

## 2011-11-03 NOTE — Progress Notes (Signed)
Third episode of vomiting around 1500 today.  Patient states he is starting to pass gas though and feeling better when turning from side to side rather on his back.

## 2011-11-03 NOTE — Progress Notes (Signed)
Blood pressure still elevated after giving IV lopressor.  Administering new order of pain medication and rechecking blood pressure.

## 2011-11-03 NOTE — Progress Notes (Signed)
Urology Progress Note  Subjective:     No acute urologic events overnight. Positive nausea and emesis. Had SOB relieved with emesis; none now. He is concerned tramadol may contribute to his SOB as well; cannot tolerate hydrocodone or oxycodone.  Ambulating. Negative flatus or BM. Voiding without difficulty.  ROS: Negative: chest pain  Objective:  Patient Vitals for the past 24 hrs:  BP Temp Temp src Pulse Resp SpO2  11/03/11 0342 166/94 mmHg 97.9 F (36.6 C) Oral 84  20  94 %  11/03/11 0247 - - - - - 94 %  11/02/11 2153 179/98 mmHg 98.1 F (36.7 C) Oral 94  22  94 %  11/02/11 1340 151/84 mmHg 97.7 F (36.5 C) Oral 83  20  91 %    Physical Exam: General:  No acute distress, awake Cardiovascular:    [x]   S1/S2 present, RRR  []   Irregularly irregular Chest:  CTA-B Abdomen:               []  Soft, appropriately TTP  []  Soft, NTTP  [x]  Soft, appropriately TTP, incision(s) clean/dry/intact, JP site dress and JP output serosanguinous.  Genitourinary: No genital edema. Foley:  No foley.    I/O last 3 completed shifts: In: 6550 [P.O.:720; I.V.:5680; IV Piggyback:150] Out: 6542 [Urine:6025; Drains:317; Blood:200]  Recent Labs  [facility] 11/02/11 0423 11/01/11 1427   HGB 14.1 13.9   WBC -- --   PLT -- --    Recent Labs  Basename 11/02/11 0423 11/01/11 1427   NA 136 135   K 4.0 3.8   CL 102 101   CO2 26 24   BUN 15 14   CREATININE 1.18 1.09   CALCIUM 8.4 8.4   GFRNONAA 63* 69*   GFRAA 72* 80*     No results found for this basename: PT:2,INR:2,APTT:2 in the last 72 hours   No components found with this basename: ABG:2    Length of stay: 2 days.  Assessment: Right partial nephrectomy. POD#2.  Plan: -Schedule dulcolax suppository BID. -Discontinue tramadol; try PO morphine. -Ambulate. -Awaiting JP creatinine results. Drain output dropping appropriately. -Discharge disposition- depends on bowel function.    Natalia Leatherwood, MD [phone]

## 2011-11-03 NOTE — Progress Notes (Addendum)
Pt voiced feeling SOB. O2 sats 94%. Pt states that this has happened before while taking ultram if he takes too much. Pt states he needs to "just wait it out". Pt burps several times and verbalizes some relief of the SOB. Pt sits HOB up and states he is just going to rest and notify RN if it worsens or no change soon. Pt c/o nausea a few minutes later. Pt is offered zofran but declines. Pt states "I think I will just throw up. I will feel better". Pt vomits and requests to walk in hall. Undigested food noted in emesis. Pt verbalizes some relief.  On-call MD paged via system @ [phone] Dr. Margarita Grizzle aware. No new orders received @ 0320

## 2011-11-04 MED ORDER — SODIUM CHLORIDE 0.9 % IV BOLUS (SEPSIS)
500.0000 mL | Freq: Once | INTRAVENOUS | Status: AC
  Administered 2011-11-04: 500 mL via INTRAVENOUS

## 2011-11-04 MED ORDER — SODIUM CHLORIDE 0.9 % IV SOLN
INTRAVENOUS | Status: DC
  Administered 2011-11-04: 08:00:00 via INTRAVENOUS

## 2011-11-04 MED ORDER — PANTOPRAZOLE SODIUM 40 MG PO TBEC
40.0000 mg | DELAYED_RELEASE_TABLET | Freq: Every day | ORAL | Status: DC
  Administered 2011-11-04: 40 mg via ORAL
  Filled 2011-11-04 (×3): qty 1

## 2011-11-04 NOTE — Progress Notes (Signed)
I spoke with the patient's wife. She states he is feeling much better and tolerated the clear liquids. Passing flatus. Still have not taken the PO morphine.  I advised we advance his diet to regular. I advised he try the PO morphine when he has pain so we will know if he can have this upon discharge home, which is anticipated tomorrow.

## 2011-11-04 NOTE — Progress Notes (Addendum)
Ambulated in the hall two times around [facility].  Denies episodes of nausea or vomiting today.

## 2011-11-04 NOTE — Progress Notes (Signed)
Urology Progress Note  Subjective:     No acute urologic events overnight. Had emesis yesterday evening; no nausea or emesis this morning. He is still NPO. Pain controlled with IV fentanyl. Patient has difficulty with PO pain medications. Positive flatus; no BM.  Ambulating. Voiding without difficulty.  ROS: Negative: chest pain, SOB  Objective:  Patient Vitals for the past 24 hrs:  BP Temp Temp src Pulse Resp SpO2  11/04/11 0506 136/76 mmHg 98.7 F (37.1 C) Oral 81  18  90 %  11/03/11 2046 158/84 mmHg 98.5 F (36.9 C) Oral 91  19  95 %  11/03/11 1904 129/67 mmHg 98.9 F (37.2 C) Oral 80  19  93 %  11/03/11 1355 166/99 mmHg 99 F (37.2 C) Oral 82  20  96 %  11/03/11 1328 176/105 mmHg - - - 19  -  11/03/11 1115 172/98 mmHg 98.4 F (36.9 C) Oral 86  19  97 %  11/03/11 0858 172/94 mmHg - - - - -    Physical Exam: General:  No acute distress, awake Cardiovascular:    [x]   S1/S2 present, RRR  []   Irregularly irregular Chest:  CTA-B Abdomen:               []  Soft, appropriately TTP  []  Soft, NTTP  [x]  Soft, appropriately TTP, non-distended, no rebound TTP, incision(s) clean/dry/intact, JP site dress and JP output serosanguinous.  Genitourinary: No genital edema. Foley:  No foley.    I/O last 3 completed shifts: In: 15 [P.O.:15] Out: 1728 [Urine:1650; Drains:78]  Recent Labs  Basename 11/02/11 0423 11/01/11 1427   HGB 14.1 13.9   WBC -- --   PLT -- --    Recent Labs  Basename 11/02/11 0423 11/01/11 1427   NA 136 135   K 4.0 3.8   CL 102 101   CO2 26 24   BUN 15 14   CREATININE 1.18 1.09   CALCIUM 8.4 8.4   GFRNONAA 63* 69*   GFRAA 72* 80*     No results found for this basename: PT:2,INR:2,APTT:2 in the last 72 hours   No components found with this basename: ABG:2    Length of stay: 3 days.  Assessment: Right partial nephrectomy. POD#3.  Plan: -Continue ulcolax suppository BID. -Clear liquid diet. IV fluids with IV fluid bolus this morning for  dehydration from emesis. -I would like him to try PO morphine after he has the chance to get some jello on his stomach to see if this will control his pain. -Ambulate. -Discharge disposition- depends on bowel function, ability to control pain with medications, and control of nausea.    Natalia Leatherwood, MD [phone]

## 2011-11-05 MED ORDER — MORPHINE SULFATE 15 MG PO TABS: 15.0000 mg | ORAL_TABLET | ORAL | Status: AC | PRN

## 2011-11-05 NOTE — Discharge Summary (Signed)
Physician Discharge Summary  Patient ID: Corey [name] MRN: [id number] DOB/AGE: 09-20-1945 66 y.o.  Admit date: 11/01/2011 Discharge date: 11/05/2011  Admission Diagnoses:  Right renal neoplasm  Discharge Diagnoses:  Right renal neoplasm  Discharged Condition: good  Hospital Course: He was admitted on 11/01/11 and underwent a right robotic assisted laparoscopic partial nephrectomy for a concerning right renal neoplasm and a complex right renal cyst.  His procedure was without complications and he was transferred to the urology floor postoperatively.  He remained hemodynamically stable throughout his hospital course.  He was maintained on bedrest for 24 hours.  He then began ambulating and his diet was advanced on postoperative day one.  He did develop some nausea and vomiting likely related to intolerance to pain medications and a possible slight postoperative ileus.  His bowel function gradually returned and he was able to find a pain medication that was tolerable.  By the morning of postoperative day # 4, he was tolerating a regular diet, ambulating well, and his pain was well controlled.  His perinephric drain fluid was checked for a creatinine level and found to be consistent with serum.  It was removed prior to discharge.  Disposition: Home   Medication List  As of 11/05/2011  7:19 AM   STOP taking these medications         diphenhydramine-acetaminophen 25-500 MG Tabs      traMADol 50 MG tablet         TAKE these medications         amLODipine 5 MG tablet   Commonly known as: NORVASC   Take 5 mg by mouth every morning.      diphenhydrAMINE 25 mg capsule   Commonly known as: BENADRYL   Take 25 mg by mouth at bedtime as needed. For sleep      doxazosin 2 MG tablet   Commonly known as: CARDURA   Take 1 mg by mouth at bedtime. Take only 1/2 tablet at night.      losartan 100 MG tablet   Commonly known as: COZAAR   Take 100 mg by mouth every morning.      metoprolol  succinate 100 MG 24 hr tablet   Commonly known as: TOPROL-XL   Take 100 mg by mouth every morning.      morphine 15 MG tablet   Commonly known as: MSIR   Take 1 tablet (15 mg total) by mouth every 4 (four) hours as needed (Pain).      nitroGLYCERIN 0.4 MG SL tablet   Commonly known as: NITROSTAT   Place 0.4 mg under the tongue every 5 (five) minutes as needed. For chest pain      nitroGLYCERIN 0.4 MG SL tablet   Commonly known as: NITROSTAT   Place 1 tablet (0.4 mg total) under the tongue every 5 (five) minutes as needed for chest pain.      omeprazole 20 MG capsule   Commonly known as: PRILOSEC   Take 20 mg by mouth every morning.      pravastatin 20 MG tablet   Commonly known as: PRAVACHOL   Take 20 mg by mouth every morning.      silodosin 4 MG Caps capsule   Commonly known as: RAPAFLO   Take 4 mg by mouth daily as needed. For kidney stone           Follow-up Information    Follow up with Crecencio Mc, MD on 11/22/2011. (at 3:30)    Contact information:  [address] [facility] [place] [address] [phone]          Signed: Crecencio Mc 11/05/2011, 7:19 AM

## 2011-11-05 NOTE — Progress Notes (Signed)
Patient ID: Corey [name], male   DOB: 19-Oct-1945, 66 y.o.   MRN: [id number]  4 Days Post-Op Subjective: Pt with nausea and emesis over the weekend.  This resolved with no nausea or vomiting overnight. He is tolerating pain medication now and actually has not needed any overnight.  He is tolerating regular diet.  Objective: Vital signs in last 24 hours: Temp:  [98.2 F (36.8 C)-98.5 F (36.9 C)] 98.4 F (36.9 C) (08/19 0445) Pulse Rate:  [75-89] 79  (08/19 0445) Resp:  [19-20] 19  (08/19 0445) BP: (130-143)/(66-79) 139/70 mmHg (08/19 0445) SpO2:  [93 %-95 %] 93 % (08/19 0445) Weight:  [85.7 kg (188 lb 15 oz)] 85.7 kg (188 lb 15 oz) (08/19 0445)  Intake/Output from previous day: 08/18 0701 - 08/19 0700 In: 720 [P.O.:720] Out: 465 [Urine:425; Drains:40] All urine not recorded according to patient. Intake/Output this shift:    Physical Exam:  General: Alert and oriented CV: RRR Lungs: Clear Abdomen: Soft, ND, positive bowel sounds Incisions: C/D/I Ext: NT, No erythema  Lab Results: JP Cr consistent with serum and drain output remains low.  Studies/Results: No results found.  Assessment/Plan: -- Discharge home -- Will call with pathology results   LOS: 4 days   Kinslee Dalpe,LES 11/05/2011, 7:08 AM

## 2011-11-08 DIAGNOSIS — H35379 Puckering of macula, unspecified eye: Secondary | ICD-10-CM | POA: Diagnosis not present

## 2011-11-08 DIAGNOSIS — H43819 Vitreous degeneration, unspecified eye: Secondary | ICD-10-CM | POA: Insufficient documentation

## 2011-11-08 DIAGNOSIS — IMO0002 Reserved for concepts with insufficient information to code with codable children: Secondary | ICD-10-CM | POA: Insufficient documentation

## 2011-11-08 DIAGNOSIS — H2510 Age-related nuclear cataract, unspecified eye: Secondary | ICD-10-CM | POA: Diagnosis not present

## 2011-11-22 DIAGNOSIS — C649 Malignant neoplasm of unspecified kidney, except renal pelvis: Secondary | ICD-10-CM | POA: Diagnosis not present

## 2011-11-22 DIAGNOSIS — N200 Calculus of kidney: Secondary | ICD-10-CM | POA: Diagnosis not present

## 2011-12-07 DIAGNOSIS — Z23 Encounter for immunization: Secondary | ICD-10-CM | POA: Diagnosis not present

## 2011-12-13 DIAGNOSIS — I10 Essential (primary) hypertension: Secondary | ICD-10-CM | POA: Diagnosis not present

## 2011-12-13 DIAGNOSIS — E785 Hyperlipidemia, unspecified: Secondary | ICD-10-CM | POA: Diagnosis not present

## 2011-12-13 DIAGNOSIS — Z125 Encounter for screening for malignant neoplasm of prostate: Secondary | ICD-10-CM | POA: Diagnosis not present

## 2011-12-13 DIAGNOSIS — R82998 Other abnormal findings in urine: Secondary | ICD-10-CM | POA: Diagnosis not present

## 2011-12-20 DIAGNOSIS — Z125 Encounter for screening for malignant neoplasm of prostate: Secondary | ICD-10-CM | POA: Diagnosis not present

## 2011-12-20 DIAGNOSIS — I10 Essential (primary) hypertension: Secondary | ICD-10-CM | POA: Diagnosis not present

## 2011-12-20 DIAGNOSIS — Z0000 Encounter for general adult medical examination without abnormal findings: Secondary | ICD-10-CM | POA: Diagnosis not present

## 2011-12-20 DIAGNOSIS — E785 Hyperlipidemia, unspecified: Secondary | ICD-10-CM | POA: Diagnosis not present

## 2011-12-20 DIAGNOSIS — N200 Calculus of kidney: Secondary | ICD-10-CM | POA: Diagnosis not present

## 2012-02-08 ENCOUNTER — Other Ambulatory Visit: Payer: Self-pay | Admitting: Dermatology

## 2012-02-08 DIAGNOSIS — D2360 Other benign neoplasm of skin of unspecified upper limb, including shoulder: Secondary | ICD-10-CM | POA: Diagnosis not present

## 2012-02-08 DIAGNOSIS — L570 Actinic keratosis: Secondary | ICD-10-CM | POA: Diagnosis not present

## 2012-02-08 DIAGNOSIS — D239 Other benign neoplasm of skin, unspecified: Secondary | ICD-10-CM | POA: Diagnosis not present

## 2012-02-08 DIAGNOSIS — C4360 Malignant melanoma of unspecified upper limb, including shoulder: Secondary | ICD-10-CM | POA: Diagnosis not present

## 2012-02-08 DIAGNOSIS — C4359 Malignant melanoma of other part of trunk: Secondary | ICD-10-CM | POA: Diagnosis not present

## 2012-02-08 DIAGNOSIS — D1801 Hemangioma of skin and subcutaneous tissue: Secondary | ICD-10-CM | POA: Diagnosis not present

## 2012-02-08 DIAGNOSIS — D485 Neoplasm of uncertain behavior of skin: Secondary | ICD-10-CM | POA: Diagnosis not present

## 2012-02-08 DIAGNOSIS — L821 Other seborrheic keratosis: Secondary | ICD-10-CM | POA: Diagnosis not present

## 2012-03-03 ENCOUNTER — Other Ambulatory Visit: Payer: Self-pay | Admitting: Dermatology

## 2012-03-03 DIAGNOSIS — C4359 Malignant melanoma of other part of trunk: Secondary | ICD-10-CM | POA: Diagnosis not present

## 2012-03-03 DIAGNOSIS — C4360 Malignant melanoma of unspecified upper limb, including shoulder: Secondary | ICD-10-CM | POA: Diagnosis not present

## 2012-03-04 DIAGNOSIS — N200 Calculus of kidney: Secondary | ICD-10-CM | POA: Diagnosis not present

## 2012-03-04 DIAGNOSIS — C649 Malignant neoplasm of unspecified kidney, except renal pelvis: Secondary | ICD-10-CM | POA: Diagnosis not present

## 2012-03-05 ENCOUNTER — Other Ambulatory Visit: Payer: Self-pay | Admitting: Urology

## 2012-03-05 NOTE — Pre-Procedure Instructions (Signed)
Asked to bring blue folder the day of the procedure,insurance card,I.D. driver's license,wear comfortable clothing and have a driver for the day. Asked not to take Advil,Motrin,Ibuprofen,Aleve or any NSAIDS, Aspirin, or Toradol for 72 hours prior to procedure,  No vitamins or herbal medications 7 days prior to procedure. Instructed to take laxative per doctor's office instructions and eat a light dinner the evening before procedure.   To arrive at 0645 for lithotripsy procedure.  

## 2012-03-11 ENCOUNTER — Encounter (HOSPITAL_COMMUNITY): Payer: Self-pay | Admitting: Pharmacy Technician

## 2012-03-17 ENCOUNTER — Ambulatory Visit (HOSPITAL_COMMUNITY): Payer: Medicare Other

## 2012-03-17 ENCOUNTER — Encounter (HOSPITAL_COMMUNITY): Payer: Self-pay | Admitting: *Deleted

## 2012-03-17 ENCOUNTER — Encounter (HOSPITAL_COMMUNITY)
Admission: RE | Disposition: A | Discharge status: Home / Self Care | Payer: Self-pay | Source: Ambulatory Visit | Attending: Urology

## 2012-03-17 ENCOUNTER — Ambulatory Visit (HOSPITAL_COMMUNITY)
Admission: RE | Admit: 2012-03-17 | Discharge: 2012-03-17 | Disposition: A | Discharge status: Home / Self Care | Payer: Medicare Other | Source: Ambulatory Visit | Attending: Urology | Admitting: Urology

## 2012-03-17 DIAGNOSIS — N189 Chronic kidney disease, unspecified: Secondary | ICD-10-CM | POA: Diagnosis not present

## 2012-03-17 DIAGNOSIS — N200 Calculus of kidney: Secondary | ICD-10-CM | POA: Diagnosis not present

## 2012-03-17 DIAGNOSIS — Z01818 Encounter for other preprocedural examination: Secondary | ICD-10-CM | POA: Diagnosis not present

## 2012-03-17 DIAGNOSIS — K219 Gastro-esophageal reflux disease without esophagitis: Secondary | ICD-10-CM | POA: Insufficient documentation

## 2012-03-17 DIAGNOSIS — I129 Hypertensive chronic kidney disease with stage 1 through stage 4 chronic kidney disease, or unspecified chronic kidney disease: Secondary | ICD-10-CM | POA: Insufficient documentation

## 2012-03-17 DIAGNOSIS — C649 Malignant neoplasm of unspecified kidney, except renal pelvis: Secondary | ICD-10-CM | POA: Diagnosis not present

## 2012-03-17 DIAGNOSIS — Z79899 Other long term (current) drug therapy: Secondary | ICD-10-CM | POA: Insufficient documentation

## 2012-03-17 DIAGNOSIS — I10 Essential (primary) hypertension: Secondary | ICD-10-CM | POA: Diagnosis not present

## 2012-03-17 SURGERY — LITHOTRIPSY, ESWL
Anesthesia: LOCAL | Laterality: Left

## 2012-03-17 MED ORDER — DIPHENHYDRAMINE HCL 25 MG PO CAPS
25.0000 mg | ORAL_CAPSULE | ORAL | Status: AC
  Administered 2012-03-17: 25 mg via ORAL
  Filled 2012-03-17: qty 1

## 2012-03-17 MED ORDER — CIPROFLOXACIN HCL 500 MG PO TABS
500.0000 mg | ORAL_TABLET | ORAL | Status: AC
  Administered 2012-03-17: 500 mg via ORAL
  Filled 2012-03-17: qty 1

## 2012-03-17 MED ORDER — DIAZEPAM 5 MG PO TABS
10.0000 mg | ORAL_TABLET | ORAL | Status: AC
  Administered 2012-03-17: 10 mg via ORAL
  Filled 2012-03-17: qty 2

## 2012-03-17 MED ORDER — DEXTROSE-NACL 5-0.45 % IV SOLN
INTRAVENOUS | Status: DC
  Administered 2012-03-17: 08:00:00 via INTRAVENOUS

## 2012-03-17 NOTE — Progress Notes (Signed)
Pt returned to short stay from [place] [employer]. Alert . Bruised area left flank

## 2012-03-17 NOTE — Op Note (Signed)
See paper chart for operative note. 

## 2012-03-17 NOTE — Interval H&P Note (Signed)
History and Physical Interval Note:  03/17/2012 8:55 AM  Corey [name]  has presented today for surgery, with the diagnosis of LEFT RENAL CALCULI  The various methods of treatment have been discussed with the patient and family. After consideration of risks, benefits and other options for treatment, the patient has consented to  Procedure(s) (LRB) with comments: EXTRACORPOREAL SHOCK WAVE LITHOTRIPSY (ESWL) (Left) as a surgical intervention .  The patient's history has been reviewed, patient examined, no change in status, stable for surgery.  I have reviewed the patient's chart and labs.  Questions were answered to the patient's satisfaction.     Jenilyn Magana,LES

## 2012-03-17 NOTE — H&P (Signed)
History of Present Illness     Corey [name] is a 66 year old with the following urologic history:  1) Renal cell carcinoma: He is s/p a right robotic partial nephrectomy for two lesions on 11/01/11. 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 Etta Grandchild, Dr. Aldean Ast, Dr. Vernie Ammons, and Dr. Earlene Plater. His first stone episode was at age 71 in 90 when he underwent cystoscopy with basket extraction.  He has since undergone numerous procedures including ureteroscopic stone removal in 1990 and 2011 and ESWL in 2001, 2005 x 2, 2007, and 2009 x 2.  He has been noted to have low urine volumes and hypercalciuria and hypocitraturia.  Current medical management: None Prior medical therapy: HCTZ (could not tolerate)  3) Prostate cancer screening: He has no family history of prostate cancer. Last PSA: 1.31 (Sep 2011)  4) CKD: He has been noted to have an elevated creatinine in the past thought to be related to chronic NSAID use with diclofenac. Baseline renal function: Cr 1.1 (May 2013)  ** Medical comorbidities: He has a history of chest pain and left bundle branch block.  He underwent a cardiac evaluation by Dr. Peter [place]aziland in 2011 which included a cardiac catheterization and apparently did not require intervention.  Interval history:  He follows up today for further evaluation of left-sided flank pain which has been acting up for the past one to 2 weeks.  He has a known left renal pelvic  stone which is relatively large and has been present for sometime. We have elected to delay treatment of the stone until he was further recovered from his recent right-sided partial nephrectomy. He denies any hematuria or fever.   Past Medical History Problems  1. History of  Cardiac Catheterization  (Diagnostic) 2. History of  Esophageal Reflux 530.81 3. History of  Hypertension 401.9 4. History of  Nephrolithiasis V13.01  Surgical History Problems  1. History of  Appendectomy 2. History of  Back Surgery 3. History of  Cystoscopy With Insertion Of Ureteral Stent Left 4. History of  Cystoscopy With Ureteroscopy With Lithotripsy 5. History of  Elbow Surgery 6. History of  Kidney Surgery Laparoscopic Partial Nephrectomy 7. History of  Knee Arthroscopy With Debridement Of Articular Cartilage 8. History of  Lithotripsy 9. History of  Lithotripsy 10. History of  Lithotripsy  Current Meds 1. Diclofenac Sodium 25 MG Oral Tablet Delayed Release; Therapy: 25Jun2013 to 2. Hydrochlorothiazide 25 MG Oral Tablet; Take 1 tablet daily; Therapy: 26Jun2013 to  (Evaluate:21Jun2014)  Requested for: 26Jun2013; Last Rx:26Jun2013 3. Hydrocodone-Acetaminophen 10-325 MG Oral Tablet; TAKE ONE-HALF TO ONE TABLET EVERY  4 HOURS AS NEEDED FOR PAIN; Therapy: 09Oct2012 to 4. HYDROmorphone HCl 2 MG Oral Tablet; TAKE 1 TABLET EVERY 4 HOURS AS NEEDED;  Therapy: 05Sep2013 to (Evaluate:09Sep2013); Last Rx:05Sep2013 5. Ibuprofen 800 MG Oral Tablet; Therapy: 04Jun2013 to 6. Losartan Potassium 100 MG Oral Tablet; Therapy: 27Feb2013 to 7.  Metoprolol Succinate ER 50 MG Oral Tablet Extended Release 24 Hour; Therapy:  (Recorded:29May2013) to 8. Morphine Sulfate 15 MG Oral Tablet; Therapy: 19Aug2013 to 9. Nitrostat 0.4 MG Sublingual Tablet Sublingual; PLACE 1 TABLET (0.4 MG TOTAL) UNDER THE  TONGUE EVERY 5 (FIVE) MINUTES; Therapy: 13Jul2012 to 10. Pravastatin Sodium 20 MG Oral Tablet; Therapy: 15Jun2012 to 11. PriLOSEC  CPDR; Therapy: (Recorded:27Sep2011) to 12. Tamsulosin HCl 0.4 MG Oral Capsule; TAKE ONE CAPSULE BY MOUTH DAILY; Therapy:   30May2013 to (Evaluate:29Jul2013)  Requested for: 05Jun2013; Last Rx:30May2013 13. TraMADol HCl 50 MG Oral Tablet; TAKE 1 TABLET 4 TIMES DAILY AS NEEDED FOR PAIN;   Therapy: 03Jun2013 to (Evaluate:16Aug2013)  Requested for: 17Jul2013; Last Rx:17Jul2013 14. TraMADol HCl 50 MG Oral Tablet; Take 1-2 tablets every 6 hours as needed for pain; Therapy:   05Sep2013 to (Evaluate:11Jan2014)  Requested for: 12Dec2013; Last Rx:12Dec2013  Allergies Medication  1. No Known Drug Allergies  Family History Problems  1. Paternal history of  Adenocarcinoma Of The Pancreas 2. Family history of  Death In The Family Father Deceased at age 78; pancreatic cancer 3. Family history of  Death In The Family Mother Deceased at age 21; old age 39. Family history of  Urologic Disorder V18.7 kidney stones-sister  Social History Problems  1. Marital History - Currently Married 2. Never A Smoker 3. Occupation: Transport planner Denied  4. History of  Alcohol Use  Vitals Vital Signs [Data Includes: Last 1 Day]  17Dec2013 04:29PM  BMI Calculated: 26.02 BSA Calculated: 2.08 Height: 5 ft 11.5 in Weight: 190 lb  Blood Pressure: 125 / 79 Temperature: 98 F Heart Rate: 87  Physical Exam Constitutional: Well nourished and well developed . No acute distress.  Pulmonary: No respiratory distress and normal respiratory rhythm and effort.  Cardiovascular: Heart rate and rhythm are normal . No peripheral edema.  Abdomen: No CVA tenderness.    Results/Data Urine [Data Includes: Last 1 Day]   17Dec2013  COLOR AMBER   APPEARANCE CLEAR   SPECIFIC GRAVITY 1.025   pH 5.5   GLUCOSE NEG mg/dL  BILIRUBIN NEG   KETONE TRACE mg/dL  BLOOD MOD   PROTEIN NEG mg/dL  UROBILINOGEN 1 mg/dL  NITRITE NEG   LEUKOCYTE ESTERASE NEG   SQUAMOUS EPITHELIAL/HPF RARE   WBC 3-6 WBC/hpf  RBC 7-10 RBC/hpf    BACTERIA RARE   CRYSTALS NONE SEEN   CASTS NONE SEEN        I attentively reviewed his KUB x-ray today. This demonstrates stable large burden stones in the left kidney. Specifically, he has what appears to be 2 separate 1 cm stones adjacent to each other near the renal pelvis likely causing intermittent obstruction. He also has various smaller left renal calculi which are nonobstructing. There are no calcifications along the expected course of the ureters. He also has one small stone measuring approximately 4 mm in the right kidney which is stable in its position which is nonobstructing.   I also reviewed his most recent CT scan which demonstrated a Hounsfield unit of 630 when measuring his left renal calculi.   Assessment Assessed  1. Renal Cell Carcinoma 189.0 2. Nephrolithiasis 592.0  Plan Health Maintenance (V70.0)  1. UA With REFLEX  Done: 17Dec2013 04:16PM Nephrolithiasis (592.0)  2. HYDROmorphone HCl 2 MG Oral Tablet; TAKE 1 TABLET EVERY 4 HOURS AS NEEDED;  Therapy: 17Dec2013 to (Last Rx:17Dec2013) 3. BASIC METABOLIC PANEL  Done: 17Dec2013 4. VENIPUNCTURE  Done: 17Dec2013 5. Follow-up Office  Follow-up  Done: 17Dec2013  Discussion/Summary  1. Symptomatic left renal calculi: We reviewed options and he is most interested in pursuing the shockwave lithotripsy. We have discussed the pros and cons of this option and the specific risks associated with this procedure. In addition, we reviewed alternative options including ureteroscopic laser lithotripsy and percutaneous nephrolithotomy. He understands the pros and cons of each option and does elect to proceed with left-sided extracorporeal shockwave lithotripsy in the near future. He has been provided pain medication for control and will call me should he develop fever, uncontrolled pain, or persistent nausea/vomiting.  2. Renal cell carcinoma: He will be due for surveillance followup in February or March.  3. Recurrent urolithiasis: He  will still need a complete metabolic evaluation once he recovers from his upcoming surgical treatment for his kidney stones.  CC: Dr. Jarome Matin     Signatures Electronically signed by : Heloise Purpura, M.D.; Mar 04 2012  5:24PM

## 2012-03-18 ENCOUNTER — Other Ambulatory Visit: Payer: Self-pay | Admitting: Dermatology

## 2012-03-18 DIAGNOSIS — D485 Neoplasm of uncertain behavior of skin: Secondary | ICD-10-CM | POA: Diagnosis not present

## 2012-04-04 DIAGNOSIS — N200 Calculus of kidney: Secondary | ICD-10-CM | POA: Diagnosis not present

## 2012-04-07 DIAGNOSIS — N200 Calculus of kidney: Secondary | ICD-10-CM | POA: Diagnosis not present

## 2012-04-17 DIAGNOSIS — M7700 Medial epicondylitis, unspecified elbow: Secondary | ICD-10-CM | POA: Diagnosis not present

## 2012-04-17 DIAGNOSIS — M1710 Unilateral primary osteoarthritis, unspecified knee: Secondary | ICD-10-CM | POA: Diagnosis not present

## 2012-04-27 ENCOUNTER — Other Ambulatory Visit: Payer: Self-pay | Admitting: Nurse Practitioner

## 2012-05-18 ENCOUNTER — Other Ambulatory Visit: Payer: Self-pay | Admitting: Cardiology

## 2012-05-21 ENCOUNTER — Other Ambulatory Visit: Payer: Self-pay | Admitting: Cardiology

## 2012-06-05 ENCOUNTER — Encounter: Payer: Self-pay | Admitting: Cardiology

## 2012-06-09 ENCOUNTER — Ambulatory Visit (HOSPITAL_COMMUNITY)
Admission: RE | Admit: 2012-06-09 | Discharge: 2012-06-09 | Disposition: A | Discharge status: Home / Self Care | Payer: Medicare Other | Source: Ambulatory Visit | Attending: Urology | Admitting: Urology

## 2012-06-09 ENCOUNTER — Other Ambulatory Visit (HOSPITAL_COMMUNITY): Payer: Self-pay | Admitting: Urology

## 2012-06-09 DIAGNOSIS — C649 Malignant neoplasm of unspecified kidney, except renal pelvis: Secondary | ICD-10-CM | POA: Diagnosis not present

## 2012-06-09 DIAGNOSIS — N200 Calculus of kidney: Secondary | ICD-10-CM | POA: Diagnosis not present

## 2012-06-09 DIAGNOSIS — I10 Essential (primary) hypertension: Secondary | ICD-10-CM | POA: Diagnosis not present

## 2012-06-10 DIAGNOSIS — C649 Malignant neoplasm of unspecified kidney, except renal pelvis: Secondary | ICD-10-CM | POA: Diagnosis not present

## 2012-06-24 DIAGNOSIS — N200 Calculus of kidney: Secondary | ICD-10-CM | POA: Diagnosis not present

## 2012-06-24 DIAGNOSIS — C649 Malignant neoplasm of unspecified kidney, except renal pelvis: Secondary | ICD-10-CM | POA: Diagnosis not present

## 2012-06-25 ENCOUNTER — Other Ambulatory Visit: Payer: Self-pay | Admitting: Urology

## 2012-07-21 ENCOUNTER — Encounter (HOSPITAL_COMMUNITY): Payer: Self-pay | Admitting: Pharmacy Technician

## 2012-07-23 ENCOUNTER — Other Ambulatory Visit (HOSPITAL_COMMUNITY): Payer: Self-pay | Admitting: Urology

## 2012-07-23 NOTE — Progress Notes (Signed)
06-09-2012 chest 2 view xray epic ekg 11-01-2011 epic

## 2012-07-23 NOTE — Patient Instructions (Addendum)
[name]  07/23/2012   Your procedure is scheduled on:07-28-2012   MONDAY  Report to [facility] a 100 PM  Call this number if you have problems the morning of surgery [phone]   Remember:   Do not eat food :After Midnight.  SUNDAY  Clear liquids midnight until 930 am day of surgery, then nothing by mouth.   Take these medicines the morning of surgery with A SIP OF WATER:   Amlodipine, Prolisec                                SEE [place] PREPARING FOR SURGERY SHEET   Do not wear jewelry, make-up or nail polish.  Do not wear lotions, powders, or perfumes. You may wear deodorant.   Men may shave face and neck.  Do not bring valuables to the hospital.  Contacts, dentures or bridgework may not be worn into surgery.  Leave suitcase in the car. After surgery it may be brought to your room.  For patients admitted to the hospital, checkout time is 11:00 AM the day of discharge.   Patients discharged the day of surgery will not be allowed to drive home.  Name and phone number of your driver:  Special Instructions: N/A   Please read over the following fact sheets that you were given: MRSA Information., clear liquid sheet Call Theodis Aguas RN pre op nurse if needed [phone]    FAILURE TO FOLLOW THESE INSTRUCTIONS MAY RESULT IN THE CANCELLATION OF YOUR SURGERY. PATIENT SIGNATURE___________________________________________

## 2012-07-24 ENCOUNTER — Other Ambulatory Visit (HOSPITAL_COMMUNITY): Payer: Medicare Other

## 2012-07-24 ENCOUNTER — Encounter (HOSPITAL_COMMUNITY)
Admission: RE | Admit: 2012-07-24 | Discharge: 2012-07-24 | Disposition: A | Discharge status: Home / Self Care | Payer: Medicare Other | Source: Ambulatory Visit | Attending: Urology | Admitting: Urology

## 2012-07-24 ENCOUNTER — Encounter (HOSPITAL_COMMUNITY): Payer: Self-pay

## 2012-07-24 DIAGNOSIS — N189 Chronic kidney disease, unspecified: Secondary | ICD-10-CM | POA: Diagnosis not present

## 2012-07-24 DIAGNOSIS — N201 Calculus of ureter: Secondary | ICD-10-CM | POA: Diagnosis not present

## 2012-07-24 DIAGNOSIS — Z79899 Other long term (current) drug therapy: Secondary | ICD-10-CM | POA: Diagnosis not present

## 2012-07-24 DIAGNOSIS — C649 Malignant neoplasm of unspecified kidney, except renal pelvis: Secondary | ICD-10-CM | POA: Diagnosis not present

## 2012-07-24 DIAGNOSIS — K219 Gastro-esophageal reflux disease without esophagitis: Secondary | ICD-10-CM | POA: Diagnosis not present

## 2012-07-24 DIAGNOSIS — Z905 Acquired absence of kidney: Secondary | ICD-10-CM | POA: Diagnosis not present

## 2012-07-24 DIAGNOSIS — I129 Hypertensive chronic kidney disease with stage 1 through stage 4 chronic kidney disease, or unspecified chronic kidney disease: Secondary | ICD-10-CM | POA: Diagnosis not present

## 2012-07-24 DIAGNOSIS — Z01812 Encounter for preprocedural laboratory examination: Secondary | ICD-10-CM | POA: Diagnosis not present

## 2012-07-24 DIAGNOSIS — I739 Peripheral vascular disease, unspecified: Secondary | ICD-10-CM | POA: Diagnosis not present

## 2012-07-24 DIAGNOSIS — N210 Calculus in bladder: Secondary | ICD-10-CM | POA: Diagnosis not present

## 2012-07-24 DIAGNOSIS — I2510 Atherosclerotic heart disease of native coronary artery without angina pectoris: Secondary | ICD-10-CM | POA: Diagnosis not present

## 2012-07-24 HISTORY — DX: Gastro-esophageal reflux disease without esophagitis: K21.9

## 2012-07-24 HISTORY — DX: Malignant (primary) neoplasm, unspecified: C80.1

## 2012-07-24 LAB — CBC
MCH: 31 pg (ref 26.0–34.0)
MCHC: 35.5 g/dL (ref 30.0–36.0)
Platelets: 183 10*3/uL (ref 150–400)
RDW: 12.6 % (ref 11.5–15.5)

## 2012-07-24 LAB — BASIC METABOLIC PANEL
BUN: 20 mg/dL (ref 6–23)
Calcium: 9.3 mg/dL (ref 8.4–10.5)
Creatinine, Ser: 1.21 mg/dL (ref 0.50–1.35)
GFR calc Af Amer: 70 mL/min — ABNORMAL LOW (ref >90)
GFR calc non Af Amer: 61 mL/min — ABNORMAL LOW (ref >90)
Potassium: 3.9 mEq/L (ref 3.5–5.1)

## 2012-07-24 LAB — SURGICAL PCR SCREEN
MRSA, PCR: NEGATIVE
Staphylococcus aureus: POSITIVE — AB

## 2012-07-24 NOTE — Progress Notes (Signed)
07/24/12 0911  OBSTRUCTIVE SLEEP APNEA  Have you ever been diagnosed with sleep apnea through a sleep study? No  Do you snore loudly (loud enough to be heard through closed doors)?  1  Do you often feel tired, fatigued, or sleepy during the daytime? 0  Do you have, or are you being treated for high blood pressure? 1  BMI more than 35 kg/m2? 0  Age over 67 years old? 1  Neck circumference greater than 40 cm/18 inches? 0  Gender: 1  Obstructive Sleep Apnea Score 4  Score 4 or greater  Results sent to PCP

## 2012-07-26 NOTE — H&P (Signed)
History of Present Illness  [name] is a 67 year old with the following urologic history:  1) Renal cell carcinoma: He is s/p a right robotic partial nephrectomy for two lesions on 11/01/11. 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 Etta Grandchild, Dr. Aldean Ast, Dr. Vernie Ammons, and Dr. Earlene Plater. His first stone episode was at age 62 in 67 when he underwent cystoscopy with basket extraction.  He has since undergone numerous procedures including ureteroscopic stone removal in 1990 and 2011 and ESWL in 2001, 2005 x 2, 2007, and 2009 x 2.  He has been noted to have low urine volumes and hypercalciuria and hypocitraturia.  Current medical management: None Prior medical therapy: HCTZ (could not tolerate)  Dec 2013: ESWL of left UPJ calculus  3) CKD: He has been noted to have an elevated creatinine in the past thought to be related to chronic NSAID use with diclofenac. Baseline renal function: Cr 1.1 (May 2013)  ** Medical comorbidities: He has a history of chest pain and left bundle branch block.  He underwent a cardiac evaluation by Dr. Peter [place] in 2011 which included a cardiac catheterization and apparently did not require intervention.  He has a remaining left ureteral fragment following his ESWL in December which he has been unable to pass.  Past Medical History Problems  1. History of  Cardiac Catheterization  (Diagnostic) 2. History of  Esophageal Reflux 530.81 3.  History of  Hypertension 401.9 4. History of  Nephrolithiasis V13.01  Surgical History Problems  1. History of  Appendectomy 2. History of  Back Surgery 3. History of  Cystoscopy With Insertion Of Ureteral Stent Left 4. History of  Cystoscopy With Ureteroscopy With Lithotripsy 5. History of  Elbow Surgery 6. History of  Kidney Surgery Laparoscopic Partial Nephrectomy 7. History of  Knee Arthroscopy With Debridement Of Articular Cartilage 8. History of  Lithotripsy 9. History of  Lithotripsy 10. History of  Lithotripsy 11. History of  Lithotripsy  Current Meds 1. Diclofenac Sodium 25 MG Oral Tablet Delayed Release; Therapy: 25Jun2013 to 2. HYDROmorphone HCl 2 MG Oral Tablet; TAKE 1 TABLET EVERY 4 HOURS AS NEEDED;  Therapy: 17Dec2013 to (Last Rx:17Dec2013) 3. Ibuprofen 800 MG Oral Tablet; Therapy: 04Jun2013 to 4. Losartan Potassium 100 MG Oral Tablet; Therapy: 27Feb2013 to 5. Metoprolol Succinate ER 50 MG Oral Tablet Extended Release 24 Hour; Therapy:  (Recorded:29May2013) to 6. Nitrostat 0.4 MG Sublingual Tablet Sublingual; PLACE 1 TABLET (0.4 MG TOTAL) UNDER THE  TONGUE EVERY 5 (FIVE) MINUTES; Therapy: 13Jul2012 to 7. Pravastatin Sodium 20 MG Oral Tablet; Therapy: 15Jun2012 to 8. PriLOSEC CPDR; Therapy: (Recorded:27Sep2011) to  Allergies Medication  1. No Known Drug Allergies  Family History Problems  1. Paternal history of  Adenocarcinoma Of The Pancreas 2. Family history of  Death In The Family Father Deceased at age 62; pancreatic cancer 3. Family history of  Death In The Family Mother Deceased  at age 46; old age 56. Family history of  Urologic Disorder V18.7 kidney stones-sister  Social History Problems  1. Marital History - Currently Married 2. Never A Smoker 3. Occupation: Transport planner Denied  4. History of  Alcohol Use  Review of Systems  Genitourinary: no hematuria.  Gastrointestinal: no nausea and no vomiting.  Constitutional: no fever and no recent  weight loss.    Vitals  BMI Calculated: 26.02 BSA Calculated: 2.08 Height: 5 ft 11.5 in Weight: 190 lb   Physical Exam Constitutional: Well nourished and well developed . No acute distress.  ENT:. The ears and nose are normal in appearance.  Neck: The appearance of the neck is normal and no neck mass is present.  Pulmonary: No respiratory distress, normal respiratory rhythm and effort and clear bilateral breath sounds.  Cardiovascular: Heart rate and rhythm are normal . No peripheral edema.  Abdomen: Incision site(s) well healed. The abdomen is soft and nontender. No masses are palpated. No CVA tenderness. No hernias are palpable. No hepatosplenomegaly noted.  Lymphatics: The supraclavicular, femoral and inguinal nodes are not enlarged or tender.  Skin: Normal skin turgor, no visible rash and no visible skin lesions.  Neuro/Psych:. Mood and affect are appropriate.      Discussion/Summary  Urolithiasis: He appears to have one remaining stone at the left ureterovesical junction. I have recommended that we plan to proceed with ureteroscopic stone removal as he has been unable to pass this fragment. We have reviewed the potential risks of ureteroscopic laser lithotripsy including but not limited to bleeding, infection, anesthetic risks, damage to the bladder or kidney, ureteral stricture formation, and need for further procedures as well as damage to the kidney, et Karie Soda.

## 2012-07-28 ENCOUNTER — Encounter (HOSPITAL_COMMUNITY): Payer: Self-pay | Admitting: *Deleted

## 2012-07-28 ENCOUNTER — Ambulatory Visit (HOSPITAL_COMMUNITY)
Admission: RE | Admit: 2012-07-28 | Discharge: 2012-07-28 | Disposition: A | Discharge status: Home / Self Care | Payer: Medicare Other | Source: Ambulatory Visit | Attending: Urology | Admitting: Urology

## 2012-07-28 ENCOUNTER — Encounter (HOSPITAL_COMMUNITY)
Admission: RE | Disposition: A | Discharge status: Home / Self Care | Payer: Self-pay | Source: Ambulatory Visit | Attending: Urology

## 2012-07-28 ENCOUNTER — Encounter (HOSPITAL_COMMUNITY): Payer: Self-pay | Admitting: Certified Registered"

## 2012-07-28 ENCOUNTER — Ambulatory Visit (HOSPITAL_COMMUNITY): Payer: Medicare Other | Admitting: Certified Registered"

## 2012-07-28 DIAGNOSIS — Z905 Acquired absence of kidney: Secondary | ICD-10-CM | POA: Insufficient documentation

## 2012-07-28 DIAGNOSIS — Z79899 Other long term (current) drug therapy: Secondary | ICD-10-CM | POA: Insufficient documentation

## 2012-07-28 DIAGNOSIS — I739 Peripheral vascular disease, unspecified: Secondary | ICD-10-CM | POA: Insufficient documentation

## 2012-07-28 DIAGNOSIS — I2510 Atherosclerotic heart disease of native coronary artery without angina pectoris: Secondary | ICD-10-CM | POA: Insufficient documentation

## 2012-07-28 DIAGNOSIS — I129 Hypertensive chronic kidney disease with stage 1 through stage 4 chronic kidney disease, or unspecified chronic kidney disease: Secondary | ICD-10-CM | POA: Diagnosis not present

## 2012-07-28 DIAGNOSIS — Z01812 Encounter for preprocedural laboratory examination: Secondary | ICD-10-CM | POA: Insufficient documentation

## 2012-07-28 DIAGNOSIS — N210 Calculus in bladder: Secondary | ICD-10-CM | POA: Diagnosis not present

## 2012-07-28 DIAGNOSIS — N201 Calculus of ureter: Secondary | ICD-10-CM | POA: Diagnosis not present

## 2012-07-28 DIAGNOSIS — N189 Chronic kidney disease, unspecified: Secondary | ICD-10-CM | POA: Diagnosis not present

## 2012-07-28 DIAGNOSIS — C649 Malignant neoplasm of unspecified kidney, except renal pelvis: Secondary | ICD-10-CM | POA: Diagnosis not present

## 2012-07-28 DIAGNOSIS — K219 Gastro-esophageal reflux disease without esophagitis: Secondary | ICD-10-CM | POA: Insufficient documentation

## 2012-07-28 HISTORY — PX: CYSTOSCOPY WITH RETROGRADE PYELOGRAM, URETEROSCOPY AND STENT PLACEMENT: SHX5789

## 2012-07-28 SURGERY — CYSTOURETEROSCOPY, WITH RETROGRADE PYELOGRAM AND STENT INSERTION
Anesthesia: General | Laterality: Left | Wound class: Clean Contaminated

## 2012-07-28 MED ORDER — LIDOCAINE HCL (CARDIAC) 20 MG/ML IV SOLN
INTRAVENOUS | Status: DC | PRN
  Administered 2012-07-28: 20 mg via INTRAVENOUS

## 2012-07-28 MED ORDER — PHENAZOPYRIDINE HCL 100 MG PO TABS: 100.0000 mg | ORAL_TABLET | Freq: Three times a day (TID) | ORAL | Status: DC | PRN

## 2012-07-28 MED ORDER — SODIUM CHLORIDE 0.9 % IR SOLN
Status: DC | PRN
  Administered 2012-07-28: 4000 mL

## 2012-07-28 MED ORDER — CIPROFLOXACIN IN D5W 400 MG/200ML IV SOLN
INTRAVENOUS | Status: AC
  Filled 2012-07-28: qty 200

## 2012-07-28 MED ORDER — ONDANSETRON HCL 4 MG/2ML IJ SOLN
INTRAMUSCULAR | Status: DC | PRN
  Administered 2012-07-28: 4 mg via INTRAVENOUS

## 2012-07-28 MED ORDER — HYDROCODONE-ACETAMINOPHEN 5-325 MG PO TABS: 1.0000 | ORAL_TABLET | Freq: Four times a day (QID) | ORAL | Status: DC | PRN

## 2012-07-28 MED ORDER — MIDAZOLAM HCL 5 MG/5ML IJ SOLN
INTRAMUSCULAR | Status: DC | PRN
  Administered 2012-07-28: 2 mg via INTRAVENOUS

## 2012-07-28 MED ORDER — IOHEXOL 300 MG/ML  SOLN
INTRAMUSCULAR | Status: DC | PRN
  Administered 2012-07-28: 10 mL

## 2012-07-28 MED ORDER — OXYCODONE HCL 5 MG PO TABS
ORAL_TABLET | ORAL | Status: AC
  Administered 2012-07-28: 5 mg
  Filled 2012-07-28: qty 1

## 2012-07-28 MED ORDER — ACETAMINOPHEN 10 MG/ML IV SOLN
INTRAVENOUS | Status: AC
  Filled 2012-07-28: qty 100

## 2012-07-28 MED ORDER — FENTANYL CITRATE 0.05 MG/ML IJ SOLN: 25.0000 ug | INTRAMUSCULAR | Status: DC | PRN

## 2012-07-28 MED ORDER — LACTATED RINGERS IV SOLN
INTRAVENOUS | Status: DC
  Administered 2012-07-28: 1000 mL via INTRAVENOUS

## 2012-07-28 MED ORDER — IOHEXOL 300 MG/ML  SOLN
INTRAMUSCULAR | Status: AC
  Filled 2012-07-28: qty 1

## 2012-07-28 MED ORDER — FENTANYL CITRATE 0.05 MG/ML IJ SOLN
INTRAMUSCULAR | Status: DC | PRN
  Administered 2012-07-28 (×2): 50 ug via INTRAVENOUS

## 2012-07-28 MED ORDER — PROPOFOL 10 MG/ML IV BOLUS
INTRAVENOUS | Status: DC | PRN
  Administered 2012-07-28: 200 mg via INTRAVENOUS

## 2012-07-28 MED ORDER — PROMETHAZINE HCL 25 MG/ML IJ SOLN: 6.2500 mg | INTRAMUSCULAR | Status: DC | PRN

## 2012-07-28 MED ORDER — CIPROFLOXACIN IN D5W 400 MG/200ML IV SOLN
400.0000 mg | INTRAVENOUS | Status: AC
  Administered 2012-07-28: 400 mg via INTRAVENOUS

## 2012-07-28 MED ORDER — INDIGOTINDISULFONATE SODIUM 8 MG/ML IJ SOLN
INTRAMUSCULAR | Status: AC
  Filled 2012-07-28: qty 5

## 2012-07-28 MED ORDER — ACETAMINOPHEN 10 MG/ML IV SOLN
INTRAVENOUS | Status: DC | PRN
  Administered 2012-07-28: 1000 mg via INTRAVENOUS

## 2012-07-28 SURGICAL SUPPLY — 16 items
ADAPTER CATH URET PLST 4-6FR (CATHETERS) IMPLANT
ADPR CATH URET STRL DISP 4-6FR (CATHETERS)
BAG URO CATCHER STRL LF (DRAPE) ×2 IMPLANT
BASKET ZERO TIP NITINOL 2.4FR (BASKET) IMPLANT
BSKT STON RTRVL ZERO TP 2.4FR (BASKET)
CATH INTERMIT  6FR 70CM (CATHETERS) ×2 IMPLANT
CLOTH BEACON ORANGE TIMEOUT ST (SAFETY) ×2 IMPLANT
DRAPE CAMERA CLOSED 9X96 (DRAPES) ×2 IMPLANT
GLOVE BIOGEL M STRL SZ7.5 (GLOVE) ×2 IMPLANT
GOWN PREVENTION PLUS XLARGE (GOWN DISPOSABLE) ×2 IMPLANT
GOWN STRL NON-REIN LRG LVL3 (GOWN DISPOSABLE) ×4 IMPLANT
GUIDEWIRE ANG ZIPWIRE 038X150 (WIRE) IMPLANT
GUIDEWIRE STR DUAL SENSOR (WIRE) ×2 IMPLANT
MANIFOLD NEPTUNE II (INSTRUMENTS) ×2 IMPLANT
PACK CYSTO (CUSTOM PROCEDURE TRAY) ×2 IMPLANT
TUBING CONNECTING 10 (TUBING) ×2 IMPLANT

## 2012-07-28 NOTE — Progress Notes (Signed)
Patient stated pain 2/10 upon arrival to short stay. Requested pain med. MD called and order received.

## 2012-07-28 NOTE — Op Note (Signed)
Preoperative diagnosis: Left ureteral calculus  Postoperative diagnosis: Bladder calculus  Procedure:  1. Cystoscopy 2. Removal of bladder calculus 3. Left ureteroscopy 4. Left retrograde pyelography with interpretation  Surgeon: Moody Bruins. M.D.  Anesthesia: General  Complications: None  Intraoperative findings: Left retrograde pyelography demonstrated a questionable filling defect in the distal ureteral but no other ureteral abnormalities or renal collecting system filling defects.  EBL: Minimal  Specimens: 1. Bladder calculus  Disposition of specimens: [facility] for stone analysis  Indication: Corey [name] is a 67 y.o. year old patient with urolithiasis. After reviewing the management options for treatment, the patient elected to proceed with the above surgical procedure(s). We have discussed the potential benefits and risks of the procedure, side effects of the proposed treatment, the likelihood of the patient achieving the goals of the procedure, and any potential problems that might occur during the procedure or recuperation. Informed consent has been obtained.  Description of procedure:  The patient was taken to the operating room and general anesthesia was induced.  The patient was placed in the dorsal lithotomy position, prepped and draped in the usual sterile fashion, and preoperative antibiotics were administered. A preoperative time-out was performed.   Cystourethroscopy was performed.  The patient's urethra was examined and was normal. The bladder was then systematically examined in its entirety. There was no evidence for any bladder tumors or other mucosal pathology.  There was a 6-7 mm stone seen within the bladder that likely had just passed from the ureter.  Attention then turned to the left ureteral orifice and a ureteral catheter was used to intubate the ureteral orifice.  Omnipaque contrast was injected through the ureteral  catheter and a retrograde pyelogram was performed with findings as dictated above.  Due to the questionable abnormality in the distal left ureter, it was decided to perform ureteroscopy.  A 0.38 sensor guidewire was then advanced up the left ureter into the renal pelvis under fluoroscopic guidance. The 6 Fr semirigid ureteroscope was then advanced into the ureter without difficulty. The ureter was examined up to the level of the iliac vessels and no stones were seen.  There was some mild edema of the distal ureter likely accounting for the abnormality seen on retrograde pyelography.  The bladder was then emptied and the procedure ended.  The patient appeared to tolerate the procedure well and without complications.  The patient was able to be awakened and transferred to the recovery unit in satisfactory condition.

## 2012-07-28 NOTE — OR Nursing (Signed)
Renal stone sent with Dr. Laverle Patter.

## 2012-07-28 NOTE — Anesthesia Postprocedure Evaluation (Signed)
  Anesthesia Post-op Note  Patient: Corey [name]  Procedure(s) Performed: Procedure(s) (LRB): CYSTOSCOPY WITH RETROGRADE PYELOGRAM, LEFT URETEROSCOPY  (Left)  Patient Location: PACU  Anesthesia Type: General  Level of Consciousness: awake and alert   Airway and Oxygen Therapy: Patient Spontanous Breathing  Post-op Pain: mild  Post-op Assessment: Post-op Vital signs reviewed, Patient's Cardiovascular Status Stable, Respiratory Function Stable, Patent Airway and No signs of Nausea or vomiting  Last Vitals:  Filed Vitals:   07/28/12 1540  BP: 136/81  Pulse: 55  Temp: 36.5 C  Resp: 16    Post-op Vital Signs: stable   Complications: No apparent anesthesia complications. OSA precautions.

## 2012-07-28 NOTE — Transfer of Care (Signed)
Immediate Anesthesia Transfer of Care Note  Patient: Corey [name]  Procedure(s) Performed: Procedure(s) (LRB): CYSTOSCOPY WITH RETROGRADE PYELOGRAM, LEFT URETEROSCOPY  (Left)  Patient Location: PACU  Anesthesia Type: General  Level of Consciousness: sedated, patient cooperative and responds to stimulaton  Airway & Oxygen Therapy: Patient Spontanous Breathing and Patient connected to face mask oxgen  Post-op Assessment: Report given to PACU RN and Post -op Vital signs reviewed and stable  Post vital signs: Reviewed and stable  Complications: No apparent anesthesia complications

## 2012-07-28 NOTE — Anesthesia Preprocedure Evaluation (Signed)
Anesthesia Evaluation  Patient identified by MRN, date of birth, ID band Patient awake    Reviewed: Allergy & Precautions, H&P , NPO status , Patient's Chart, lab work & pertinent test results  Airway Mallampati: II TM Distance: >3 FB Neck ROM: Full    Dental no notable dental hx.    Pulmonary neg pulmonary ROS, shortness of breath,  breath sounds clear to auscultation  Pulmonary exam normal       Cardiovascular Exercise Tolerance: Good hypertension, Pt. on medications and Pt. on home beta blockers + CAD and + Peripheral Vascular Disease negative cardio ROS  + dysrhythmias + Valvular Problems/Murmurs Rhythm:Regular Rate:Normal     Neuro/Psych negative neurological ROS  negative psych ROS   GI/Hepatic negative GI ROS, Neg liver ROS, GERD-  Medicated,  Endo/Other  negative endocrine ROS  Renal/GU Renal diseasenegative Renal ROS  negative genitourinary   Musculoskeletal negative musculoskeletal ROS (+)   Abdominal   Peds negative pediatric ROS (+)  Hematology negative hematology ROS (+)   Anesthesia Other Findings   Reproductive/Obstetrics negative OB ROS                           Anesthesia Physical Anesthesia Plan  ASA: III  Anesthesia Plan: General   Post-op Pain Management:    Induction: Intravenous  Airway Management Planned: LMA  Additional Equipment:   Intra-op Plan:   Post-operative Plan: Extubation in OR  Informed Consent: I have reviewed the patients History and Physical, chart, labs and discussed the procedure including the risks, benefits and alternatives for the proposed anesthesia with the patient or authorized representative who has indicated his/her understanding and acceptance.   Dental advisory given  Plan Discussed with: CRNA  Anesthesia Plan Comments: (CAD, AAA, LBBB, GERD)        Anesthesia Quick Evaluation

## 2012-07-29 ENCOUNTER — Encounter (HOSPITAL_COMMUNITY): Payer: Self-pay | Admitting: Urology

## 2012-07-29 DIAGNOSIS — N201 Calculus of ureter: Secondary | ICD-10-CM | POA: Diagnosis not present

## 2012-08-19 DIAGNOSIS — N201 Calculus of ureter: Secondary | ICD-10-CM | POA: Diagnosis not present

## 2012-12-04 DIAGNOSIS — N200 Calculus of kidney: Secondary | ICD-10-CM | POA: Diagnosis not present

## 2012-12-18 ENCOUNTER — Ambulatory Visit (HOSPITAL_COMMUNITY)
Admission: RE | Admit: 2012-12-18 | Discharge: 2012-12-18 | Disposition: A | Discharge status: Home / Self Care | Payer: Medicare Other | Source: Ambulatory Visit | Attending: Urology | Admitting: Urology

## 2012-12-18 ENCOUNTER — Other Ambulatory Visit (HOSPITAL_COMMUNITY): Payer: Self-pay | Admitting: Urology

## 2012-12-18 DIAGNOSIS — C649 Malignant neoplasm of unspecified kidney, except renal pelvis: Secondary | ICD-10-CM | POA: Diagnosis not present

## 2012-12-19 DIAGNOSIS — Z125 Encounter for screening for malignant neoplasm of prostate: Secondary | ICD-10-CM | POA: Diagnosis not present

## 2012-12-19 DIAGNOSIS — R7309 Other abnormal glucose: Secondary | ICD-10-CM | POA: Diagnosis not present

## 2012-12-19 DIAGNOSIS — E785 Hyperlipidemia, unspecified: Secondary | ICD-10-CM | POA: Diagnosis not present

## 2012-12-19 DIAGNOSIS — C649 Malignant neoplasm of unspecified kidney, except renal pelvis: Secondary | ICD-10-CM | POA: Diagnosis not present

## 2012-12-19 DIAGNOSIS — I10 Essential (primary) hypertension: Secondary | ICD-10-CM | POA: Diagnosis not present

## 2012-12-23 DIAGNOSIS — Z85528 Personal history of other malignant neoplasm of kidney: Secondary | ICD-10-CM | POA: Diagnosis not present

## 2012-12-23 DIAGNOSIS — N200 Calculus of kidney: Secondary | ICD-10-CM | POA: Diagnosis not present

## 2012-12-26 DIAGNOSIS — Z23 Encounter for immunization: Secondary | ICD-10-CM | POA: Diagnosis not present

## 2012-12-26 DIAGNOSIS — E785 Hyperlipidemia, unspecified: Secondary | ICD-10-CM | POA: Diagnosis not present

## 2012-12-26 DIAGNOSIS — Z6828 Body mass index (BMI) 28.0-28.9, adult: Secondary | ICD-10-CM | POA: Diagnosis not present

## 2012-12-26 DIAGNOSIS — Z1331 Encounter for screening for depression: Secondary | ICD-10-CM | POA: Diagnosis not present

## 2012-12-26 DIAGNOSIS — K219 Gastro-esophageal reflux disease without esophagitis: Secondary | ICD-10-CM | POA: Diagnosis not present

## 2012-12-26 DIAGNOSIS — Z0000 Encounter for general adult medical examination without abnormal findings: Secondary | ICD-10-CM | POA: Diagnosis not present

## 2012-12-26 DIAGNOSIS — I10 Essential (primary) hypertension: Secondary | ICD-10-CM | POA: Diagnosis not present

## 2013-02-19 ENCOUNTER — Other Ambulatory Visit: Payer: Self-pay | Admitting: Dermatology

## 2013-02-19 DIAGNOSIS — Z85820 Personal history of malignant melanoma of skin: Secondary | ICD-10-CM | POA: Diagnosis not present

## 2013-02-19 DIAGNOSIS — L821 Other seborrheic keratosis: Secondary | ICD-10-CM | POA: Diagnosis not present

## 2013-02-19 DIAGNOSIS — L819 Disorder of pigmentation, unspecified: Secondary | ICD-10-CM | POA: Diagnosis not present

## 2013-02-19 DIAGNOSIS — D239 Other benign neoplasm of skin, unspecified: Secondary | ICD-10-CM | POA: Diagnosis not present

## 2013-02-19 DIAGNOSIS — D485 Neoplasm of uncertain behavior of skin: Secondary | ICD-10-CM | POA: Diagnosis not present

## 2013-02-19 DIAGNOSIS — D1801 Hemangioma of skin and subcutaneous tissue: Secondary | ICD-10-CM | POA: Diagnosis not present

## 2013-03-26 ENCOUNTER — Other Ambulatory Visit: Payer: Self-pay | Admitting: Dermatology

## 2013-03-26 DIAGNOSIS — Z85820 Personal history of malignant melanoma of skin: Secondary | ICD-10-CM | POA: Diagnosis not present

## 2013-03-26 DIAGNOSIS — D485 Neoplasm of uncertain behavior of skin: Secondary | ICD-10-CM | POA: Diagnosis not present

## 2013-05-26 ENCOUNTER — Other Ambulatory Visit: Payer: Self-pay | Admitting: Cardiology

## 2013-05-28 ENCOUNTER — Other Ambulatory Visit: Payer: Self-pay | Admitting: Cardiology

## 2013-05-28 ENCOUNTER — Other Ambulatory Visit: Payer: Self-pay

## 2013-05-28 ENCOUNTER — Other Ambulatory Visit: Payer: Self-pay | Admitting: Nurse Practitioner

## 2013-05-28 MED ORDER — AMLODIPINE BESYLATE 5 MG PO TABS: 5.0000 mg | ORAL_TABLET | Freq: Every morning | ORAL | Status: DC

## 2013-05-28 MED ORDER — LOSARTAN POTASSIUM 100 MG PO TABS: 100.0000 mg | ORAL_TABLET | Freq: Every morning | ORAL | Status: DC

## 2013-06-25 ENCOUNTER — Other Ambulatory Visit: Payer: Self-pay | Admitting: Nurse Practitioner

## 2013-06-26 ENCOUNTER — Other Ambulatory Visit: Payer: Self-pay | Admitting: Nurse Practitioner

## 2013-07-02 ENCOUNTER — Encounter: Payer: Self-pay | Admitting: Cardiology

## 2013-07-02 ENCOUNTER — Ambulatory Visit (INDEPENDENT_AMBULATORY_CARE_PROVIDER_SITE_OTHER): Payer: Medicare Other | Admitting: Cardiology

## 2013-07-02 ENCOUNTER — Other Ambulatory Visit: Payer: Self-pay

## 2013-07-02 VITALS — BP 142/80 | HR 77 | Ht 71.0 in | Wt 211.0 lb

## 2013-07-02 DIAGNOSIS — I10 Essential (primary) hypertension: Secondary | ICD-10-CM

## 2013-07-02 DIAGNOSIS — I447 Left bundle-branch block, unspecified: Secondary | ICD-10-CM

## 2013-07-02 DIAGNOSIS — I712 Thoracic aortic aneurysm, without rupture, unspecified: Secondary | ICD-10-CM | POA: Diagnosis not present

## 2013-07-02 DIAGNOSIS — I2510 Atherosclerotic heart disease of native coronary artery without angina pectoris: Secondary | ICD-10-CM | POA: Diagnosis not present

## 2013-07-02 LAB — BASIC METABOLIC PANEL WITH GFR
BUN: 25 mg/dL — ABNORMAL HIGH (ref 6–23)
CO2: 29 meq/L (ref 19–32)
Calcium: 9.3 mg/dL (ref 8.4–10.5)
Chloride: 103 meq/L (ref 96–112)
Creatinine, Ser: 1.6 mg/dL — ABNORMAL HIGH (ref 0.4–1.5)
GFR: 46.29 mL/min — ABNORMAL LOW
Glucose, Bld: 117 mg/dL — ABNORMAL HIGH (ref 70–99)
Potassium: 4.8 meq/L (ref 3.5–5.1)
Sodium: 137 meq/L (ref 135–145)

## 2013-07-02 MED ORDER — LOSARTAN POTASSIUM 100 MG PO TABS: 100.0000 mg | ORAL_TABLET | Freq: Every morning | ORAL | Status: DC

## 2013-07-02 MED ORDER — METOPROLOL SUCCINATE ER 25 MG PO TB24: 25.0000 mg | ORAL_TABLET | Freq: Every day | ORAL | Status: DC

## 2013-07-02 MED ORDER — AMLODIPINE BESYLATE 5 MG PO TABS: 5.0000 mg | ORAL_TABLET | Freq: Every morning | ORAL | Status: DC

## 2013-07-02 MED ORDER — DOXAZOSIN MESYLATE 1 MG PO TABS: 1.0000 mg | ORAL_TABLET | Freq: Every day | ORAL | Status: DC

## 2013-07-02 NOTE — Progress Notes (Signed)
Corey [name] Date of Birth: Jun 17, 1945 Medical Record #[id number]  History of Present Illness: Corey [name] is seen back today for a follow up visit. He was last seen in Jan 2013. He needs his medications refilled.  He has a history of HTN and thoracic aortic aneurysm. Last checked in 12/12.  Has had some renal insufficiency. Told to avoid NSAIDs and HCTZ. He has just minimal disease per prior cath. No chest pain reported. Doesn't exercise regularly and has gained 9 lbs. Does note some SOB going up stairs or hitting golf balls.   Current Outpatient Prescriptions on File Prior to Visit  Medication Sig Dispense Refill  . amLODipine (NORVASC) 5 MG tablet Take 1 tablet (5 mg total) by mouth every morning.  30 tablet  1  . aspirin 325 MG tablet Take 325 mg by mouth daily.      . diphenhydrAMINE (BENADRYL) 25 mg capsule Take 25 mg by mouth at bedtime as needed. For sleep      . doxazosin (CARDURA) 1 MG tablet Take 1 mg by mouth at bedtime.      Marland Kitchen HYDROcodone-acetaminophen (NORCO/VICODIN) 5-325 MG per tablet Take 1-2 tablets by mouth every 6 (six) hours as needed for pain.  25 tablet  0  . losartan (COZAAR) 100 MG tablet Take 1 tablet (100 mg total) by mouth every morning.  30 tablet  1  . metoprolol succinate (TOPROL-XL) 25 MG 24 hr tablet Take 25 mg by mouth at bedtime.      Marland Kitchen omeprazole (PRILOSEC) 20 MG capsule Take 20 mg by mouth every morning.       . phenazopyridine (PYRIDIUM) 100 MG tablet Take 1 tablet (100 mg total) by mouth 3 (three) times daily as needed for pain (for burning).  20 tablet  0  . pravastatin (PRAVACHOL) 20 MG tablet Take 20 mg by mouth every morning.        No current facility-administered medications on file prior to visit.    No Known Allergies  Past Medical History  Diagnosis Date  . Hypertension   . Coronary artery disease     minimal per cath in 2011  . Nephrolithiasis   . Lactose intolerance   . Ascending aortic aneurysm     Last CT in July 2012  . Left  bundle branch block   . Heart murmur   . Arthritis 10-22-11    hx. osteoarthritis-knees  . GERD (gastroesophageal reflux disease)   . Cancer     right kidney    Past Surgical History  Procedure Laterality Date  . Knee surgery      arthroscopic  . Total knee arthroplasty      bilateral  . Elbow surgery  10-22-11    right elbow  . Lumbar spine surgery  10-22-11    '03- Lumbar fusion -retained hardware([name])  . Appendectomy    . Cardiac catheterization  12/22/09    EF60%/minimal nonobstructive atherosclerotic coronary artery disease/normal lt ventricular function/mod aortic root dilatation  . Cystoscopy/retrograde/ureteroscopy/stone extraction with basket  10-22-11    1'12  . Partial nephrectomy Right 8/13  . Cystoscopy with retrograde pyelogram, ureteroscopy and stent placement Left 07/28/2012    Procedure: CYSTOSCOPY WITH RETROGRADE PYELOGRAM, LEFT URETEROSCOPY ;  Surgeon: Dutch Gray, MD;  Location: [facility];  Service: Urology;  Laterality: Left;    History  Smoking status  . Never Smoker   Smokeless tobacco  . Never Used    History  Alcohol Use No    Family  History  Problem Relation Age of Onset  . Cancer Father     pancreatic  . Heart attack Brother     Review of Systems: As noted in HPI. He reports prior surgery for a pea sized renal tumor.   All other systems were reviewed and are negative.  Physical Exam: BP 142/80  Pulse 77  Ht 5\' 11"  (1.803 m)  Wt 211 lb (95.709 kg)  BMI 29.44 kg/m2 Patient is  pleasant and in no acute distress.  Skin is warm and dry. Color is normal.  HEENT is unremarkable. Normocephalic/atraumatic. PERRL. Sclera are nonicteric. Neck is supple. No masses. No JVD or bruits. Lungs are clear. Cardiac exam shows a regular rate and rhythm. Normal S1-2. No murmur or gallop. Abdomen is soft. Extremities are without edema. Gait and ROM are intact. No gross neurologic deficits noted.   LABORATORY DATA: Ecg: NSR rate 79 bpm, chronic LBBB  Assessment  / Plan: 1. HTN- BP under fair control. Will refill current BP meds.  2. Thoracic aortic aneurysm. Last measured 4.5 cm. Will update chest CT  3. CAD- minimal by prior cath.  4. LBBB chronic.  I have encouraged Corey [name] to exercise more and lose weight. I think this will help his dyspnea. I will follow up in one year.

## 2013-07-02 NOTE — Addendum Note (Signed)
Addended by: Stephannie Peters on: 07/02/2013 10:41 AM   Modules accepted: Orders

## 2013-07-02 NOTE — Patient Instructions (Addendum)
We will schedule you for a chest CT to follow up your aneurysm  You need to exercise more and lose.  I will see you in one year.

## 2013-07-03 DIAGNOSIS — C649 Malignant neoplasm of unspecified kidney, except renal pelvis: Secondary | ICD-10-CM | POA: Diagnosis not present

## 2013-07-08 ENCOUNTER — Inpatient Hospital Stay: Admission: RE | Admit: 2013-07-08 | Payer: Medicare Other | Source: Ambulatory Visit

## 2013-07-10 ENCOUNTER — Other Ambulatory Visit (HOSPITAL_COMMUNITY): Payer: Self-pay | Admitting: Urology

## 2013-07-10 ENCOUNTER — Ambulatory Visit (HOSPITAL_COMMUNITY)
Admission: RE | Admit: 2013-07-10 | Discharge: 2013-07-10 | Disposition: A | Discharge status: Home / Self Care | Payer: Medicare Other | Source: Ambulatory Visit | Attending: Urology | Admitting: Urology

## 2013-07-10 DIAGNOSIS — C649 Malignant neoplasm of unspecified kidney, except renal pelvis: Secondary | ICD-10-CM

## 2013-07-13 ENCOUNTER — Ambulatory Visit (INDEPENDENT_AMBULATORY_CARE_PROVIDER_SITE_OTHER)
Admission: RE | Admit: 2013-07-13 | Discharge: 2013-07-13 | Disposition: A | Discharge status: Home / Self Care | Payer: Medicare Other | Source: Ambulatory Visit | Attending: Cardiology | Admitting: Cardiology

## 2013-07-13 DIAGNOSIS — I712 Thoracic aortic aneurysm, without rupture, unspecified: Secondary | ICD-10-CM | POA: Diagnosis not present

## 2013-07-13 DIAGNOSIS — I2510 Atherosclerotic heart disease of native coronary artery without angina pectoris: Secondary | ICD-10-CM

## 2013-07-13 DIAGNOSIS — I447 Left bundle-branch block, unspecified: Secondary | ICD-10-CM | POA: Diagnosis not present

## 2013-07-13 DIAGNOSIS — I10 Essential (primary) hypertension: Secondary | ICD-10-CM | POA: Diagnosis not present

## 2013-07-13 MED ORDER — IOHEXOL 350 MG/ML SOLN
100.0000 mL | Freq: Once | INTRAVENOUS | Status: AC | PRN
  Administered 2013-07-13: 100 mL via INTRAVENOUS

## 2013-07-14 ENCOUNTER — Other Ambulatory Visit: Payer: Self-pay

## 2013-07-14 DIAGNOSIS — I10 Essential (primary) hypertension: Secondary | ICD-10-CM

## 2013-07-14 DIAGNOSIS — I2510 Atherosclerotic heart disease of native coronary artery without angina pectoris: Secondary | ICD-10-CM

## 2013-07-14 DIAGNOSIS — R0602 Shortness of breath: Secondary | ICD-10-CM

## 2013-07-14 MED ORDER — FUROSEMIDE 20 MG PO TABS: 20.0000 mg | ORAL_TABLET | Freq: Every day | ORAL | Status: DC

## 2013-07-28 ENCOUNTER — Other Ambulatory Visit (INDEPENDENT_AMBULATORY_CARE_PROVIDER_SITE_OTHER): Payer: Medicare Other

## 2013-07-28 ENCOUNTER — Other Ambulatory Visit: Payer: Medicare Other

## 2013-07-28 DIAGNOSIS — I2510 Atherosclerotic heart disease of native coronary artery without angina pectoris: Secondary | ICD-10-CM | POA: Diagnosis not present

## 2013-07-28 DIAGNOSIS — R0602 Shortness of breath: Secondary | ICD-10-CM | POA: Diagnosis not present

## 2013-07-28 DIAGNOSIS — I10 Essential (primary) hypertension: Secondary | ICD-10-CM | POA: Diagnosis not present

## 2013-07-28 LAB — BRAIN NATRIURETIC PEPTIDE: Pro B Natriuretic peptide (BNP): 21 pg/mL (ref 0.0–100.0)

## 2013-07-28 LAB — BASIC METABOLIC PANEL
BUN: 24 mg/dL — AB (ref 6–23)
CHLORIDE: 104 meq/L (ref 96–112)
CO2: 27 mEq/L (ref 19–32)
Calcium: 9.2 mg/dL (ref 8.4–10.5)
Creatinine, Ser: 1.7 mg/dL — ABNORMAL HIGH (ref 0.4–1.5)
GFR: 44.04 mL/min — ABNORMAL LOW (ref >60.00)
Glucose, Bld: 137 mg/dL — ABNORMAL HIGH (ref 70–99)
POTASSIUM: 4.4 meq/L (ref 3.5–5.1)
Sodium: 139 mEq/L (ref 135–145)

## 2013-08-03 ENCOUNTER — Ambulatory Visit (HOSPITAL_COMMUNITY): Payer: Medicare Other | Attending: Cardiology | Admitting: Cardiology

## 2013-08-03 DIAGNOSIS — R0609 Other forms of dyspnea: Secondary | ICD-10-CM | POA: Insufficient documentation

## 2013-08-03 DIAGNOSIS — R0602 Shortness of breath: Secondary | ICD-10-CM | POA: Diagnosis not present

## 2013-08-03 DIAGNOSIS — R0989 Other specified symptoms and signs involving the circulatory and respiratory systems: Secondary | ICD-10-CM | POA: Diagnosis not present

## 2013-08-03 DIAGNOSIS — I2510 Atherosclerotic heart disease of native coronary artery without angina pectoris: Secondary | ICD-10-CM | POA: Diagnosis not present

## 2013-08-03 NOTE — Progress Notes (Signed)
Echo performed. 

## 2013-08-19 ENCOUNTER — Ambulatory Visit: Payer: Medicare Other | Admitting: Cardiology

## 2013-08-24 ENCOUNTER — Ambulatory Visit: Payer: Medicare Other | Admitting: Cardiology

## 2013-08-25 ENCOUNTER — Telehealth: Payer: Self-pay | Admitting: Cardiology

## 2013-08-25 ENCOUNTER — Encounter: Payer: Self-pay | Admitting: Cardiology

## 2013-08-25 NOTE — Telephone Encounter (Deleted)
Error

## 2013-09-14 NOTE — Telephone Encounter (Signed)
Spoke to patient he stated he missed his follow up appointment with Dr.Jordan.Stated his wife was in hospital and she is currently in hospital now.Stated he has noticed the past couple of weeks he gets sob when he walks from his car to his office.No chest pain. Appointment scheduled with Dr.Jordan 09/21/13 at 4:15 pm at our [place].Advised to call sooner if needed.

## 2013-09-21 ENCOUNTER — Encounter: Payer: Self-pay | Admitting: Cardiology

## 2013-09-21 ENCOUNTER — Ambulatory Visit (INDEPENDENT_AMBULATORY_CARE_PROVIDER_SITE_OTHER): Payer: Medicare Other | Admitting: Cardiology

## 2013-09-21 VITALS — BP 108/73 | HR 78 | Ht 71.5 in | Wt 206.1 lb

## 2013-09-21 DIAGNOSIS — I712 Thoracic aortic aneurysm, without rupture, unspecified: Secondary | ICD-10-CM | POA: Diagnosis not present

## 2013-09-21 DIAGNOSIS — I10 Essential (primary) hypertension: Secondary | ICD-10-CM | POA: Diagnosis not present

## 2013-09-21 DIAGNOSIS — R0602 Shortness of breath: Secondary | ICD-10-CM

## 2013-09-21 DIAGNOSIS — I2510 Atherosclerotic heart disease of native coronary artery without angina pectoris: Secondary | ICD-10-CM | POA: Diagnosis not present

## 2013-09-21 NOTE — Patient Instructions (Signed)
Continue the lasix and your other medications.  Try and get back into your aerobic exercise.  I will see you in 6 months.

## 2013-09-21 NOTE — Progress Notes (Signed)
Corey [name] Date of Birth: 10/30/1945 Medical Record #[id number]  History of Present Illness: Corey [name] is seen for follow up of dyspnea.   He has a history of HTN and thoracic aortic aneurysm. He has some chronic kidney disease.   He has a history of minimal coronary artery disease per prior cath in 2011. On his previous visit he complained of dyspnea on exertion. He notes this when walking or playing golf. It is worse in the hot, humid weather. No chest pain. Recent CT of the chest showed no change in his aortic aneurysm but did show pulmonary infiltrates c/w edema versus pneumonitis. BNP was low at 21. He had gained 9 lbs. We tried him on lasix 20 mg daily. He reports a good diuresis. He has lost 5 lbs. He thinks this has helped some with his breathing. He denies any SOB when he walks on a treadmill. He has been under increased stress recently. His wife has been hospitalized with internal bleeding and almost died. She is recovering.   Current Outpatient Prescriptions on File Prior to Visit  Medication Sig Dispense Refill  . amLODipine (NORVASC) 5 MG tablet Take 1 tablet (5 mg total) by mouth every morning.  90 tablet  3  . diclofenac (VOLTAREN) 25 MG EC tablet As needed      . diphenhydrAMINE (BENADRYL) 25 mg capsule Take 25 mg by mouth at bedtime as needed. For sleep      . doxazosin (CARDURA) 1 MG tablet Take 1 tablet (1 mg total) by mouth at bedtime.  90 tablet  3  . furosemide (LASIX) 20 MG tablet Take 1 tablet (20 mg total) by mouth daily.  30 tablet  6  . losartan (COZAAR) 100 MG tablet Take 1 tablet (100 mg total) by mouth every morning.  90 tablet  3  . metoprolol succinate (TOPROL-XL) 25 MG 24 hr tablet Take 1 tablet (25 mg total) by mouth at bedtime.  90 tablet  3  . omeprazole (PRILOSEC) 20 MG capsule Take 20 mg by mouth every morning.       . potassium citrate (UROCIT-K) 10 MEQ (1080 MG) SR tablet tabe 2 tabs twice a day      . pravastatin (PRAVACHOL) 20 MG tablet Take 20 mg  by mouth every morning.        No current facility-administered medications on file prior to visit.    No Known Allergies  Past Medical History  Diagnosis Date  . Hypertension   . Coronary artery disease     minimal per cath in 2011  . Nephrolithiasis   . Lactose intolerance   . Ascending aortic aneurysm     Last CT in July 2012  . Left bundle branch block   . Heart murmur   . Arthritis 10-22-11    hx. osteoarthritis-knees  . GERD (gastroesophageal reflux disease)   . Cancer     right kidney    Past Surgical History  Procedure Laterality Date  . Knee surgery      arthroscopic  . Total knee arthroplasty      bilateral  . Elbow surgery  10-22-11    right elbow  . Lumbar spine surgery  10-22-11    '03- Lumbar fusion -retained hardware([name])  . Appendectomy    . Cardiac catheterization  12/22/09    EF60%/minimal nonobstructive atherosclerotic coronary artery disease/normal lt ventricular function/mod aortic root dilatation  . Cystoscopy/retrograde/ureteroscopy/stone extraction with basket  10-22-11    1'12  . Partial  nephrectomy Right 8/13  . Cystoscopy with retrograde pyelogram, ureteroscopy and stent placement Left 07/28/2012    Procedure: CYSTOSCOPY WITH RETROGRADE PYELOGRAM, LEFT URETEROSCOPY ;  Surgeon: Dutch Gray, MD;  Location: [facility];  Service: Urology;  Laterality: Left;    History  Smoking status  . Never Smoker   Smokeless tobacco  . Never Used    History  Alcohol Use No    Family History  Problem Relation Age of Onset  . Cancer Father     pancreatic  . Heart attack Brother     Review of Systems: As noted in HPI. All other systems were reviewed and are negative.  Physical Exam: BP 108/73  Pulse 78  Ht 5' 11.5" (1.816 m)  Wt 206 lb 1.6 oz (93.486 kg)  BMI 28.35 kg/m2 Patient is  pleasant and in no acute distress.  Skin is warm and dry. Color is normal.  HEENT is unremarkable. Normocephalic/atraumatic. PERRL. Sclera are nonicteric. Neck is  supple. No masses. No JVD or bruits. Lungs are clear. Cardiac exam shows a regular rate and rhythm. Normal S1-2. No murmur or gallop. Abdomen is soft. Extremities are without edema. Gait and ROM are intact. No gross neurologic deficits noted.   LABORATORY DATA:  Lab Results  Component Value Date   WBC 4.3 07/24/2012   HGB 15.3 07/24/2012   HCT 43.1 07/24/2012   PLT 183 07/24/2012   GLUCOSE 137* 07/28/2013   ALT 29 08/27/2006   AST 23 08/27/2006   NA 139 07/28/2013   K 4.4 07/28/2013   CL 104 07/28/2013   CREATININE 1.7* 07/28/2013   BUN 24* 07/28/2013   CO2 27 07/28/2013   INR 1.4 09/04/2006    CHEST 2 VIEW  COMPARISON: 12/18/2012  FINDINGS:  The heart size and mediastinal contours are within normal limits.  Both lungs are clear. The visualized skeletal structures are  unremarkable. Bilateral nipple shadows are noted.  IMPRESSION:  No active cardiopulmonary disease.  Electronically Signed  By: Franchot Gallo M.D.  On: 07/10/2013 09:45  CT ANGIOGRAPHY CHEST WITH CONTRAST  TECHNIQUE:  Multidetector CT imaging of the chest was performed using the  standard protocol during bolus administration of intravenous  contrast. Multiplanar CT image reconstructions and MIPs were  obtained to evaluate the vascular anatomy.  CONTRAST: 168mL OMNIPAQUE IOHEXOL 350 MG/ML SOLN  COMPARISON: DG CHEST 2 VIEW dated 07/10/2013; CT ANGIO CHEST W/CM  &/OR WO/CM dated 11/29/2010; CT ABD-PEL WO/W CM dated 12/19/2012; CT  ANGIO CHEST W/CM &/OR WO/CM dated 12/16/2009; CT UROGRAM dated  12/13/2009; CT UROGRAM dated 05/30/2007  FINDINGS:  Stable approximately 4.5cm maximum diameter ascending aortic  aneurysm is present. Aortic arch and descending aorta are stable and  mildly ectatic. No evidence of dissection. No evidence of aneurysm  progression. Great vessels patent. Cardiomegaly. Coronary artery  disease. No pulmonary embolus.  Thoracic esophagus is unremarkable. No significant mediastinal hilar  adenopathy noted.   Large airways are patent. Bilateral pulmonary interstitial  prominence noted. Congestive heart failure with interstitial  pulmonary edema could present this fashion. Interstitial pneumonitis  cannot be excluded. Tiny pleural effusions cannot be excluded.  Visualize adrenals normal. Tiny enhancing nodule right hepatic lobe,  most likely tiny hemangioma.  Thyroid unremarkable. No supraclavicular or pathologic axillary  adenopathy noted. Chest wall is intact. No acute bony abnormality.  Degenerative changes thoracic spine.  Review of the MIP images confirms the above findings.  IMPRESSION:  1. Stable ascending thoracic aortic aneurysm with maximum diameter  4.5 cm.  2.  Findings suggesting congestive heart failure and mild pulmonary  interstitial edema. Interstitial pneumonitis cannot be excluded.  Electronically Signed  By: Marcello Moores Register  On: 07/13/2013 11:48    Echo: 08/03/13:Study Conclusions  - Left ventricle: Abnormal septal motion. The cavity size was normal. Wall thickness was increased in a pattern of moderate LVH. Systolic function was normal. The estimated ejection fraction was in the range of 50% to 55%. Doppler parameters are consistent with abnormal left ventricular relaxation (grade 1 diastolic dysfunction). - Atrial septum: No defect or patent foramen ovale was identified.     Assessment / Plan: 1. HTN- BP under good control. Will refill current BP meds.  2. Thoracic aortic aneurysm. Unchanged at 4.5 cm.  3. CAD- minimal by prior cath.  4. LBBB chronic.  5. Dyspnea. CT findings suggest edema versus pneumonitis. CXR was unremarkable. This may represent diastolic CHF but low BNP would argue against this. He does think he is better with diuresis. We will continue diuretics for now. If symptoms do progress will refer to pulmonary for evaluation of possible pneumonitis. He saw Dr. Elsworth Soho in 2011 for his dyspnea but evaluation at that time was unremarkable. Encourage  exercise but he should be cautious when it is excessively hot.

## 2013-11-02 DIAGNOSIS — R0602 Shortness of breath: Secondary | ICD-10-CM | POA: Diagnosis not present

## 2013-11-02 DIAGNOSIS — I447 Left bundle-branch block, unspecified: Secondary | ICD-10-CM | POA: Diagnosis not present

## 2013-11-02 DIAGNOSIS — Z6828 Body mass index (BMI) 28.0-28.9, adult: Secondary | ICD-10-CM | POA: Diagnosis not present

## 2013-11-02 DIAGNOSIS — I10 Essential (primary) hypertension: Secondary | ICD-10-CM | POA: Diagnosis not present

## 2013-11-05 ENCOUNTER — Ambulatory Visit (INDEPENDENT_AMBULATORY_CARE_PROVIDER_SITE_OTHER): Payer: Medicare Other | Admitting: Cardiology

## 2013-11-05 ENCOUNTER — Encounter: Payer: Self-pay | Admitting: Cardiology

## 2013-11-05 VITALS — BP 108/76 | HR 76 | Ht 71.5 in | Wt 206.0 lb

## 2013-11-05 DIAGNOSIS — R0602 Shortness of breath: Secondary | ICD-10-CM

## 2013-11-05 DIAGNOSIS — I447 Left bundle-branch block, unspecified: Secondary | ICD-10-CM

## 2013-11-05 DIAGNOSIS — I10 Essential (primary) hypertension: Secondary | ICD-10-CM

## 2013-11-05 DIAGNOSIS — I2510 Atherosclerotic heart disease of native coronary artery without angina pectoris: Secondary | ICD-10-CM

## 2013-11-05 DIAGNOSIS — I712 Thoracic aortic aneurysm, without rupture, unspecified: Secondary | ICD-10-CM

## 2013-11-05 NOTE — Progress Notes (Signed)
Corey [name] Date of Birth: 28-Jun-1945 Medical Record #[id number]  History of Present Illness: [name] is seen for follow up of dyspnea.   He has a history of HTN and thoracic aortic aneurysm. He has some chronic kidney disease.   He has a history of minimal coronary artery disease per prior cath in 2011. On prior visits he complained of dyspnea on exertion. He notes this when walking or playing golf. It is worse in the hot, humid weather. No chest pain.  CT of the chest showed no change in his aortic aneurysm but did show pulmonary infiltrates c/w edema versus pneumonitis. BNP was low at 21.  We tried him on lasix 20 mg daily. He reported a good diuresis. He has lost 5 lbs. He didn't really notice any improvement in his dyspnea. Recently he complained of lightheadedness and lasix was stopped with improvement in these symptoms. He denies any SOB when he walks on a treadmill.   Current Outpatient Prescriptions on File Prior to Visit  Medication Sig Dispense Refill  . amLODipine (NORVASC) 5 MG tablet Take 1 tablet (5 mg total) by mouth every morning.  90 tablet  3  . aspirin EC 81 MG tablet Take 81 mg by mouth daily.      . diclofenac (VOLTAREN) 25 MG EC tablet As needed      . diphenhydrAMINE (BENADRYL) 25 mg capsule Take 25 mg by mouth at bedtime as needed. For sleep      . doxazosin (CARDURA) 1 MG tablet Take 1 tablet (1 mg total) by mouth at bedtime.  90 tablet  3  . losartan (COZAAR) 100 MG tablet Take 1 tablet (100 mg total) by mouth every morning.  90 tablet  3  . metoprolol succinate (TOPROL-XL) 25 MG 24 hr tablet Take 1 tablet (25 mg total) by mouth at bedtime.  90 tablet  3  . omeprazole (PRILOSEC) 20 MG capsule Take 20 mg by mouth every morning.       . potassium citrate (UROCIT-K) 10 MEQ (1080 MG) SR tablet tabe 2 tabs twice a day      . pravastatin (PRAVACHOL) 20 MG tablet Take 20 mg by mouth every morning.        No current facility-administered medications on file prior to  visit.    No Known Allergies  Past Medical History  Diagnosis Date  . Hypertension   . Coronary artery disease     minimal per cath in 2011  . Nephrolithiasis   . Lactose intolerance   . Ascending aortic aneurysm     Last CT in July 2012  . Left bundle branch block   . Heart murmur   . Arthritis 10-22-11    hx. osteoarthritis-knees  . GERD (gastroesophageal reflux disease)   . Cancer     right kidney  . Dyspnea     Past Surgical History  Procedure Laterality Date  . Knee surgery      arthroscopic  . Total knee arthroplasty      bilateral  . Elbow surgery  10-22-11    right elbow  . Lumbar spine surgery  10-22-11    '03- Lumbar fusion -retained hardware([name])  . Appendectomy    . Cardiac catheterization  12/22/09    EF60%/minimal nonobstructive atherosclerotic coronary artery disease/normal lt ventricular function/mod aortic root dilatation  . Cystoscopy/retrograde/ureteroscopy/stone extraction with basket  10-22-11    1'12  . Partial nephrectomy Right 8/13  . Cystoscopy with retrograde pyelogram, ureteroscopy and stent  placement Left 07/28/2012    Procedure: CYSTOSCOPY WITH RETROGRADE PYELOGRAM, LEFT URETEROSCOPY ;  Surgeon: Dutch Gray, MD;  Location: [facility];  Service: Urology;  Laterality: Left;    History  Smoking status  . Never Smoker   Smokeless tobacco  . Never Used    History  Alcohol Use No    Family History  Problem Relation Age of Onset  . Cancer Father     pancreatic  . Heart attack Brother     Review of Systems: As noted in HPI. All other systems were reviewed and are negative.  Physical Exam: BP 108/76  Pulse 76  Ht 5' 11.5" (1.816 m)  Wt 206 lb (93.441 kg)  BMI 28.33 kg/m2 Patient is  pleasant and in no acute distress.  Skin is warm and dry. Color is normal.  HEENT is unremarkable. Normocephalic/atraumatic. PERRL. Sclera are nonicteric. Neck is supple. No masses. No JVD or bruits. Lungs are clear. Cardiac exam shows a regular rate and  rhythm. Normal S1-2. No murmur or gallop. Abdomen is soft. Extremities are without edema. Gait and ROM are intact. No gross neurologic deficits noted.   LABORATORY DATA:  Lab Results  Component Value Date   WBC 4.3 07/24/2012   HGB 15.3 07/24/2012   HCT 43.1 07/24/2012   PLT 183 07/24/2012   GLUCOSE 137* 07/28/2013   ALT 29 08/27/2006   AST 23 08/27/2006   NA 139 07/28/2013   K 4.4 07/28/2013   CL 104 07/28/2013   CREATININE 1.7* 07/28/2013   BUN 24* 07/28/2013   CO2 27 07/28/2013   INR 1.4 09/04/2006    CHEST 2 VIEW  COMPARISON: 12/18/2012  FINDINGS:  The heart size and mediastinal contours are within normal limits.  Both lungs are clear. The visualized skeletal structures are  unremarkable. Bilateral nipple shadows are noted.  IMPRESSION:  No active cardiopulmonary disease.  Electronically Signed  By: Franchot Gallo M.D.  On: 07/10/2013 09:45  CT ANGIOGRAPHY CHEST WITH CONTRAST  TECHNIQUE:  Multidetector CT imaging of the chest was performed using the  standard protocol during bolus administration of intravenous  contrast. Multiplanar CT image reconstructions and MIPs were  obtained to evaluate the vascular anatomy.  CONTRAST: 185mL OMNIPAQUE IOHEXOL 350 MG/ML SOLN  COMPARISON: DG CHEST 2 VIEW dated 07/10/2013; CT ANGIO CHEST W/CM  &/OR WO/CM dated 11/29/2010; CT ABD-PEL WO/W CM dated 12/19/2012; CT  ANGIO CHEST W/CM &/OR WO/CM dated 12/16/2009; CT UROGRAM dated  12/13/2009; CT UROGRAM dated 05/30/2007  FINDINGS:  Stable approximately 4.5cm maximum diameter ascending aortic  aneurysm is present. Aortic arch and descending aorta are stable and  mildly ectatic. No evidence of dissection. No evidence of aneurysm  progression. Great vessels patent. Cardiomegaly. Coronary artery  disease. No pulmonary embolus.  Thoracic esophagus is unremarkable. No significant mediastinal hilar  adenopathy noted.  Large airways are patent. Bilateral pulmonary interstitial  prominence noted. Congestive  heart failure with interstitial  pulmonary edema could present this fashion. Interstitial pneumonitis  cannot be excluded. Tiny pleural effusions cannot be excluded.  Visualize adrenals normal. Tiny enhancing nodule right hepatic lobe,  most likely tiny hemangioma.  Thyroid unremarkable. No supraclavicular or pathologic axillary  adenopathy noted. Chest wall is intact. No acute bony abnormality.  Degenerative changes thoracic spine.  Review of the MIP images confirms the above findings.  IMPRESSION:  1. Stable ascending thoracic aortic aneurysm with maximum diameter  4.5 cm.  2. Findings suggesting congestive heart failure and mild pulmonary  interstitial edema. Interstitial pneumonitis cannot  be excluded.  Electronically Signed  By: Marcello Moores Register  On: 07/13/2013 11:48    Echo: 08/03/13:Study Conclusions  - Left ventricle: Abnormal septal motion. The cavity size was normal. Wall thickness was increased in a pattern of moderate LVH. Systolic function was normal. The estimated ejection fraction was in the range of 50% to 55%. Doppler parameters are consistent with abnormal left ventricular relaxation (grade 1 diastolic dysfunction). - Atrial septum: No defect or patent foramen ovale was identified.     Assessment / Plan: 1. HTN- BP under good control.   2. Thoracic aortic aneurysm. Unchanged at 4.5 cm.  3. CAD- minimal by prior cath.  4. LBBB chronic.  5. Dyspnea. CT findings suggest edema versus pneumonitis.  This could represent diastolic CHF but low BNP would argue against this. No real improvement with diuresis. He is schedule to follow up with pulmonary in a couple of weeks. I suspect this is more of an early pneumonitis. I have encouraged continued aerobic exercise.  I will follow up in 6 months.

## 2013-11-05 NOTE — Patient Instructions (Signed)
Continue your current therapy  I will see you in 6 months.   

## 2013-11-17 ENCOUNTER — Encounter: Payer: Self-pay | Admitting: Pulmonary Disease

## 2013-11-17 ENCOUNTER — Ambulatory Visit (INDEPENDENT_AMBULATORY_CARE_PROVIDER_SITE_OTHER): Payer: Medicare Other

## 2013-11-17 ENCOUNTER — Ambulatory Visit (INDEPENDENT_AMBULATORY_CARE_PROVIDER_SITE_OTHER): Payer: Medicare Other | Admitting: Pulmonary Disease

## 2013-11-17 VITALS — BP 122/70 | HR 76 | Temp 97.5°F | Ht 71.5 in | Wt 208.2 lb

## 2013-11-17 DIAGNOSIS — R0602 Shortness of breath: Secondary | ICD-10-CM | POA: Diagnosis not present

## 2013-11-17 DIAGNOSIS — J8410 Pulmonary fibrosis, unspecified: Secondary | ICD-10-CM

## 2013-11-17 DIAGNOSIS — J849 Interstitial pulmonary disease, unspecified: Secondary | ICD-10-CM

## 2013-11-17 DIAGNOSIS — I2510 Atherosclerotic heart disease of native coronary artery without angina pectoris: Secondary | ICD-10-CM | POA: Diagnosis not present

## 2013-11-17 LAB — SEDIMENTATION RATE: SED RATE: 8 mm/h (ref 0–22)

## 2013-11-17 NOTE — Patient Instructions (Addendum)
We will make arrangements for a lung function test at [facility] We will order blood work and call you with the results We will have you follow up in 2-3 weeks

## 2013-11-17 NOTE — Progress Notes (Signed)
Subjective:    Patient ID: Corey [name], male    DOB: 03/15/1946, 68 y.o.   MRN: [id number]  HPI  Corey [name] is here to see me today for the possibility of interstitial lung disease.  About 10 years ago he was g=having a lot of dyspnea when he was at the driving range so he went to a cardiologist for evaluation.  He had a heart cath and was found to have hypertension and a thoracic aortic aneurysm.  He notes dyspnea when he is at the driving range, worse when the weather is hot.  He has noticed that he also has dyspnea when climbing up and down stairs.  He was recently started on lasix and this only made him dizzy and actually made his dyspnea worse.  He also tried nitroglycerin which didn't help, only gave him a headache.    When he walks up the hill to his [employer] he gets dyspnea, but when he goes inside to exercise he doesn't have too much of a problem.  He hasn't been exercising as much lately as he used to.  He typically does weights and resistance training then he does 5-10 minutes of time on the treadmill.  He feels pain in his shoulders and neck and chest when he has dyspnea.  He says that when he gets dyspnea on the driving range he will feel a pain in his shoulders and in his chest.  He has to rest for a few minutes in the cool air in his car and it helps. He notes that he is not nearly as active as he used to be because work has been more demanding lately.  This has been going on now for a couple of years, again only really in the heat.   The problem has not really accelerated.    He has never been told that he had a lung problem.  He thinks that his maternal grandmother had asthma, no other first degree relative.  He remembers having something like a heat stroke once when he was a kid.    He does not have cough or have chest congestion. 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 No basement in his house, no new environmental changes.   Past Medical History  Diagnosis Date  .  Hypertension   . Coronary artery disease     minimal per cath in 2011  . Nephrolithiasis   . Lactose intolerance   . Ascending aortic aneurysm     Last CT in July 2012  . Left bundle branch block   . Heart murmur   . Arthritis 10-22-11    hx. osteoarthritis-knees  . GERD (gastroesophageal reflux disease)   . Cancer     right kidney  . Dyspnea      Family History  Problem Relation Age of Onset  . Cancer Father     pancreatic  . Heart attack Brother      History   Social History  . Marital Status: Married    Spouse Name: N/A    Number of Children: 4  . Years of Education: N/A   Occupational History  . sales [employer]    Social History Main Topics  . Smoking status: Never Smoker   . Smokeless tobacco: Never Used  . Alcohol Use: No  . Drug Use: No  . Sexual Activity: Yes   Other Topics Concern  . Not on file   Social History Narrative  . No narrative on  file     No Known Allergies   Outpatient Prescriptions Prior to Visit  Medication Sig Dispense Refill  . amLODipine (NORVASC) 5 MG tablet Take 1 tablet (5 mg total) by mouth every morning.  90 tablet  3  . aspirin EC 81 MG tablet Take 81 mg by mouth daily.      . diclofenac (VOLTAREN) 25 MG EC tablet As needed      . diphenhydrAMINE (BENADRYL) 25 mg capsule Take 25 mg by mouth at bedtime as needed. For sleep      . doxazosin (CARDURA) 1 MG tablet Take 1 tablet (1 mg total) by mouth at bedtime.  90 tablet  3  . losartan (COZAAR) 100 MG tablet Take 1 tablet (100 mg total) by mouth every morning.  90 tablet  3  . metoprolol succinate (TOPROL-XL) 25 MG 24 hr tablet Take 1 tablet (25 mg total) by mouth at bedtime.  90 tablet  3  . omeprazole (PRILOSEC) 20 MG capsule Take 20 mg by mouth every morning.       . potassium citrate (UROCIT-K) 10 MEQ (1080 MG) SR tablet tabe 2 tabs twice a day      . pravastatin (PRAVACHOL) 20 MG tablet Take 20 mg by mouth every morning.        No facility-administered medications prior to  visit.      Review of Systems  Constitutional: Negative for fever and unexpected weight change.  HENT: Negative for congestion, dental problem, ear pain, nosebleeds, postnasal drip, rhinorrhea, sinus pressure, sneezing, sore throat and trouble swallowing.   Eyes: Negative for redness and itching.  Respiratory: Positive for shortness of breath. Negative for cough, chest tightness and wheezing.   Cardiovascular: Negative for palpitations and leg swelling.  Gastrointestinal: Negative for nausea and vomiting.  Genitourinary: Negative for dysuria.  Musculoskeletal: Negative for joint swelling.  Skin: Negative for rash.  Neurological: Negative for headaches.  Hematological: Does not bruise/bleed easily.  Psychiatric/Behavioral: Negative for dysphoric mood. The patient is not nervous/anxious.        Objective:   Physical Exam Filed Vitals:   11/17/13 0912  BP: 122/70  Pulse: 76  Temp: 97.5 F (36.4 C)  TempSrc: Oral  Height: 5' 11.5" (1.816 m)  Weight: 208 lb 3.2 oz (94.439 kg)  SpO2: 99%   Ambulated 500 feet on room air and O2 saturation did not drop below 94%  Gen: well appearing, no acute distress HEENT: NCAT, PERRL, EOMi, OP clear, neck supple without masses PULM: CTA B CV: RRR, slight systolic murmur, no JVD AB: BS+, soft, nontender, no hsm Ext: warm, no edema, no clubbing, no cyanosis Derm: no rash or skin breakdown Neuro: A&Ox4, CN II-XII intact, strength 5/5 in all 4 extremities  07/13/2013 CT angiogram chest > thoracic aorta 4.5 cm, unchanged, no pulmonary embolism, no emphysema, there is very mild groundglass opacification in a dependent distribution bilaterally no pleural disease, normal cardiac silhouette no mediastinal lymphadenopathy   May 2015 echocardiogram> abnormal septal motion noted, calcified aortic valve without stenosis, moderate LVH, LVEF 50-55%, RV and are normal      Assessment & Plan:   DYSPNEA Today on physical exam he has normal sounding  lungs, and his ambulatory oximetry was completely normal. I have reviewed the images from his CT chest. There is very, very mild groundglass seen in a dependent distribution bilaterally without pleural disease. Normally I would assume that this was pulmonary edema but he did not seem to respond to Lasix as we  would expect. In fact, it sounds like he had a paradoxical response. He does not have a past medical history significant for a connective tissue disease which would typically be associated with an inflammatory pneumonitis. The distribution of this very mild groundglass change is not suggestive of interstitial lung disease. Further, his normal oximetry and lung exam support this.  However, considering the fact that this is his second presentation to our office in 4 years for dyspnea we will proceed with full pulmonary function testing to ensure that there is no evidence of lung disease. I will also draw a connective tissue disease/interstitial lung disease panel to try to put this issue to rest.  I wonder about the possibility of hypertrophic obstructive cardiomyopathy. He has a systolic murmur on exam without evidence of aortic stenosis. Further, his echocardiogram notes moderate LVH with an abnormal septal motion. I feel that this diagnosis could be a unifying 1 considering the fact that his dyspnea and dizziness appear to be worse in the heat when he is volume depleted.  To complete the workup for shortness of breath I will also obtain a CBC to look for anemia. He does not have evidence of neurologic disease on physical exam but we will check a pulmonary function test to look for restrictive lung disease.  If his lung function test and blood work are normal and I think we can as soon he has no evidence of lung disease. I will discuss the possibility of hypertrophic obstructive cardiomyopathy with Dr. Martinique.  Plan: - Full pulmonary function testing - CBC - Interstitial lung disease serologic  panel - followup 2-3 weeks   Updated Medication List Outpatient Encounter Prescriptions as of 11/17/2013  Medication Sig  . amLODipine (NORVASC) 5 MG tablet Take 1 tablet (5 mg total) by mouth every morning.  Marland Kitchen aspirin EC 81 MG tablet Take 81 mg by mouth daily.  . diclofenac (VOLTAREN) 25 MG EC tablet As needed  . diphenhydrAMINE (BENADRYL) 25 mg capsule Take 25 mg by mouth at bedtime as needed. For sleep  . doxazosin (CARDURA) 1 MG tablet Take 1 tablet (1 mg total) by mouth at bedtime.  Marland Kitchen losartan (COZAAR) 100 MG tablet Take 1 tablet (100 mg total) by mouth every morning.  . metoprolol succinate (TOPROL-XL) 25 MG 24 hr tablet Take 1 tablet (25 mg total) by mouth at bedtime.  Marland Kitchen omeprazole (PRILOSEC) 20 MG capsule Take 20 mg by mouth every morning.   . potassium citrate (UROCIT-K) 10 MEQ (1080 MG) SR tablet tabe 2 tabs twice a day  . pravastatin (PRAVACHOL) 20 MG tablet Take 20 mg by mouth every morning.

## 2013-11-17 NOTE — Assessment & Plan Note (Signed)
Today on physical exam he has normal sounding lungs, and his ambulatory oximetry was completely normal. I have reviewed the images from his CT chest. There is very, very mild groundglass seen in a dependent distribution bilaterally without pleural disease. Normally I would assume that this was pulmonary edema but he did not seem to respond to Lasix as we would expect. In fact, it sounds like he had a paradoxical response. He does not have a past medical history significant for a connective tissue disease which would typically be associated with an inflammatory pneumonitis. The distribution of this very mild groundglass change is not suggestive of interstitial lung disease. Further, his normal oximetry and lung exam support this.  However, considering the fact that this is his second presentation to our office in 4 years for dyspnea we will proceed with full pulmonary function testing to ensure that there is no evidence of lung disease. I will also draw a connective tissue disease/interstitial lung disease panel to try to put this issue to rest.  I wonder about the possibility of hypertrophic obstructive cardiomyopathy. He has a systolic murmur on exam without evidence of aortic stenosis. Further, his echocardiogram notes moderate LVH with an abnormal septal motion. I feel that this diagnosis could be a unifying 1 considering the fact that his dyspnea and dizziness appear to be worse in the heat when he is volume depleted.  To complete the workup for shortness of breath I will also obtain a CBC to look for anemia. He does not have evidence of neurologic disease on physical exam but we will check a pulmonary function test to look for restrictive lung disease.  If his lung function test and blood work are normal and I think we can as soon he has no evidence of lung disease. I will discuss the possibility of hypertrophic obstructive cardiomyopathy with Dr. Martinique.  Plan: - Full pulmonary function testing -  CBC - Interstitial lung disease serologic panel - followup 2-3 weeks

## 2013-11-18 LAB — ANTI-JO 1 ANTIBODY, IGG: Anti JO-1: <0.2 AI (ref 0.0–0.9)

## 2013-11-18 LAB — SJOGRENS SYNDROME-A EXTRACTABLE NUCLEAR ANTIBODY: SSA (Ro) (ENA) Antibody, IgG: <1

## 2013-11-18 LAB — ANTI-SCLERODERMA ANTIBODY: Scleroderma (Scl-70) (ENA) Antibody, IgG: <1

## 2013-11-18 LAB — CENTROMERE ANTIBODIES: CENTROMERE AB SCREEN: NEGATIVE

## 2013-11-18 LAB — CYCLIC CITRUL PEPTIDE ANTIBODY, IGG: Cyclic Citrullin Peptide Ab: <2 U/mL (ref 0.0–5.0)

## 2013-11-18 LAB — SJOGRENS SYNDROME-B EXTRACTABLE NUCLEAR ANTIBODY: SSB (La) (ENA) Antibody, IgG: <1

## 2013-11-18 LAB — ANA: ANA: NEGATIVE

## 2013-11-19 LAB — RHEUMATOID FACTOR

## 2013-11-19 LAB — ALDOLASE: ALDOLASE: 5.9 U/L (ref <=8.1)

## 2013-11-19 LAB — C-REACTIVE PROTEIN: CRP: 0.5 mg/dL (ref <0.60)

## 2013-11-25 ENCOUNTER — Encounter: Payer: Self-pay | Admitting: Pulmonary Disease

## 2013-12-14 ENCOUNTER — Encounter: Payer: Self-pay | Admitting: Pulmonary Disease

## 2013-12-14 ENCOUNTER — Ambulatory Visit (INDEPENDENT_AMBULATORY_CARE_PROVIDER_SITE_OTHER): Payer: Medicare Other | Admitting: Pulmonary Disease

## 2013-12-14 VITALS — BP 118/80 | HR 80 | Ht 70.0 in | Wt 204.0 lb

## 2013-12-14 DIAGNOSIS — J8410 Pulmonary fibrosis, unspecified: Secondary | ICD-10-CM

## 2013-12-14 DIAGNOSIS — R0602 Shortness of breath: Secondary | ICD-10-CM | POA: Diagnosis not present

## 2013-12-14 DIAGNOSIS — J849 Interstitial pulmonary disease, unspecified: Secondary | ICD-10-CM

## 2013-12-14 DIAGNOSIS — I2510 Atherosclerotic heart disease of native coronary artery without angina pectoris: Secondary | ICD-10-CM

## 2013-12-14 LAB — PULMONARY FUNCTION TEST
DL/VA % PRED: 89 %
DL/VA: 4.14 ml/min/mmHg/L
DLCO UNC: 30.45 ml/min/mmHg
DLCO unc % pred: 94 %
FEF 25-75 Post: 2.34 L/sec
FEF 25-75 Pre: 2.45 L/sec
FEF2575-%Change-Post: -4 %
FEF2575-%PRED-PRE: 95 %
FEF2575-%Pred-Post: 91 %
FEV1-%Change-Post: 0 %
FEV1-%Pred-Post: 105 %
FEV1-%Pred-Pre: 104 %
FEV1-Post: 3.51 L
FEV1-Pre: 3.48 L
FEV1FVC-%Change-Post: 8 %
FEV1FVC-%Pred-Pre: 95 %
FEV6-%CHANGE-POST: -8 %
FEV6-%PRED-POST: 105 %
FEV6-%PRED-PRE: 114 %
FEV6-POST: 4.49 L
FEV6-Pre: 4.88 L
FEV6FVC-%Change-Post: 0 %
FEV6FVC-%PRED-POST: 104 %
FEV6FVC-%Pred-Pre: 104 %
FVC-%Change-Post: -6 %
FVC-%PRED-POST: 102 %
FVC-%Pred-Pre: 110 %
FVC-POST: 4.6 L
FVC-PRE: 4.94 L
POST FEV6/FVC RATIO: 99 %
Post FEV1/FVC ratio: 76 %
Pre FEV1/FVC ratio: 71 %
Pre FEV6/FVC Ratio: 99 %
RV % pred: 115 %
RV: 2.79 L
TLC % pred: 111 %
TLC: 7.8 L

## 2013-12-14 NOTE — Progress Notes (Signed)
PFT done today. 

## 2013-12-14 NOTE — Patient Instructions (Signed)
We will repeat a Pulmonary function test and a CT scan in one year to look for lung disease Right now however your lungs look great Come back to see me if you get shortness of breath or a cough that won't go away Otherwise I will see you in a year

## 2013-12-14 NOTE — Progress Notes (Signed)
Subjective:    Patient ID: Corey [name], male    DOB: January 05, 1946, 68 y.o.   MRN: [id number]  Synopsis: Referred to [facility] in 2015 for evaluation of interstitial lung disease.  He had no history of connective tissue disease but a 06/2013 CT angiogram chest showed a small amount of dependent ground glass in the bases of his lungs.  A serologic panel was completely normal (no suggestion of a connective tissue disease) and his PFTs were completely normal as well.     HPI  12/14/2013 ROV > Corey [name] has been doing well since the last visit. He has not had significant shortness of breath and he has been quite active with cough and exercise. His lung function test and blood work were all completely normal. He says that he only has shortness of breath when the weather is hot and he has not been drinking enough fluids. He has not had chest pain. He has not had cough. He does not think the albuterol helps. He has been working out at [employer] and does not have significant dyspnea with that.  Past Medical History  Diagnosis Date  . Hypertension   . Coronary artery disease     minimal per cath in 2011  . Nephrolithiasis   . Lactose intolerance   . Ascending aortic aneurysm     Last CT in July 2012  . Left bundle branch block   . Heart murmur   . Arthritis 10-22-11    hx. osteoarthritis-knees  . GERD (gastroesophageal reflux disease)   . Cancer     right kidney  . Dyspnea      Review of Systems     Objective:   Physical Exam Filed Vitals:   12/14/13 1013  BP: 118/80  Pulse: 80  Height: 5\' 10"  (1.778 m)  Weight: 204 lb (92.534 kg)  SpO2: 95%     Gen: well appearing, no acute distress HEENT: NCAT, EOMi, OP clear,  PULM: CTA B CV: RRR, no mgr, no JVD AB: BS+, soft, nontender,  Ext: warm, no edema, no clubbing, no cyanosis Derm: no rash or skin breakdown Neuro: A&Ox4, CN II-XII intact, MAEW    07/13/2013 CT angiogram chest > thoracic aorta 4.5 cm, unchanged, no pulmonary  embolism, no emphysema, there is very mild groundglass opacification in a dependent distribution bilaterally no pleural disease, normal cardiac silhouette no mediastinal lymphadenopathy   May 2015 echocardiogram> abnormal septal motion noted, calcified aortic valve without stenosis, moderate LVH, LVEF 50-55%, RV and are normal  11/2013 ANA, CRP, Aldolase, RF, SSA/SSB, CCP, Anti-Jo-1, SCL-70 all normal  12/14/2013 pulmonary function testing> ratio 76%, FEV1 3.51 L (105% predicted, no change with bronchodilator), total lung capacity 7.80 L (111% predicted), DLCO 30.45 (94% predicted).        Assessment & Plan:   DYSPNEA There is 0 evidence of lung disease based on his pulmonary function testing. Further, there is only very mild groundglass opacification seen in the April 2015 CT angiogram of his chest. So I have explained to him that there is very little evidence of significant pulmonary disease at this point. Further, I do not see benefit from ongoing albuterol use.  I continue to question whether or not he could have hypertrophic cardiomyopathy considering the "abnormal septal motion" in the setting of LVH noted on his echocardiogram. Further, his symptoms seem to worsen with volume depletion (either not taking enough or when he was taking Lasix) which makes me question whether or not HCOM.  I have asked him to discuss this with his cardiologist.  Plan: -because of the mild ground glass changes on his CT chest I will follow him for a year with repeat CT chest (high resolution) and PFTs -if he develops dyspnea in the meantime he should see me -otherwise he should stay active and stay well hydrated in hot weather as this seems to be the only clear correllation with his mild symptoms.    Updated Medication List Outpatient Encounter Prescriptions as of 12/14/2013  Medication Sig  . amLODipine (NORVASC) 5 MG tablet Take 1 tablet (5 mg total) by mouth every morning.  Marland Kitchen aspirin EC 81 MG tablet  Take 81 mg by mouth daily.  . diclofenac (VOLTAREN) 25 MG EC tablet As needed  . diphenhydrAMINE (BENADRYL) 25 mg capsule Take 25 mg by mouth at bedtime as needed. For sleep  . doxazosin (CARDURA) 1 MG tablet Take 1 tablet (1 mg total) by mouth at bedtime.  Marland Kitchen losartan (COZAAR) 100 MG tablet Take 1 tablet (100 mg total) by mouth every morning.  . metoprolol succinate (TOPROL-XL) 25 MG 24 hr tablet Take 1 tablet (25 mg total) by mouth at bedtime.  Marland Kitchen omeprazole (PRILOSEC) 20 MG capsule Take 20 mg by mouth every morning.   . potassium citrate (UROCIT-K) 10 MEQ (1080 MG) SR tablet tabe 2 tabs twice a day  . pravastatin (PRAVACHOL) 20 MG tablet Take 20 mg by mouth every morning.

## 2013-12-14 NOTE — Assessment & Plan Note (Signed)
There is 0 evidence of lung disease based on his pulmonary function testing. Further, there is only very mild groundglass opacification seen in the April 2015 CT angiogram of his chest. So I have explained to him that there is very little evidence of significant pulmonary disease at this point. Further, I do not see benefit from ongoing albuterol use.  I continue to question whether or not he could have hypertrophic cardiomyopathy considering the "abnormal septal motion" in the setting of LVH noted on his echocardiogram. Further, his symptoms seem to worsen with volume depletion (either not taking enough or when he was taking Lasix) which makes me question whether or not HCOM.  I have asked him to discuss this with his cardiologist.  Plan: -because of the mild ground glass changes on his CT chest I will follow him for a year with repeat CT chest (high resolution) and PFTs -if he develops dyspnea in the meantime he should see me -otherwise he should stay active and stay well hydrated in hot weather as this seems to be the only clear correllation with his mild symptoms.

## 2013-12-21 DIAGNOSIS — I10 Essential (primary) hypertension: Secondary | ICD-10-CM | POA: Diagnosis not present

## 2013-12-21 DIAGNOSIS — Z125 Encounter for screening for malignant neoplasm of prostate: Secondary | ICD-10-CM | POA: Diagnosis not present

## 2013-12-21 DIAGNOSIS — R739 Hyperglycemia, unspecified: Secondary | ICD-10-CM | POA: Diagnosis not present

## 2013-12-21 DIAGNOSIS — E785 Hyperlipidemia, unspecified: Secondary | ICD-10-CM | POA: Diagnosis not present

## 2013-12-26 ENCOUNTER — Emergency Department (HOSPITAL_COMMUNITY)
Admission: EM | Admit: 2013-12-26 | Discharge: 2013-12-27 | Disposition: A | Discharge status: Home / Self Care | Payer: Medicare Other | Attending: Emergency Medicine | Admitting: Emergency Medicine

## 2013-12-26 ENCOUNTER — Encounter (HOSPITAL_COMMUNITY): Payer: Self-pay | Admitting: Emergency Medicine

## 2013-12-26 ENCOUNTER — Emergency Department (HOSPITAL_COMMUNITY): Payer: Medicare Other

## 2013-12-26 DIAGNOSIS — K219 Gastro-esophageal reflux disease without esophagitis: Secondary | ICD-10-CM | POA: Diagnosis not present

## 2013-12-26 DIAGNOSIS — R0789 Other chest pain: Secondary | ICD-10-CM | POA: Diagnosis not present

## 2013-12-26 DIAGNOSIS — M1990 Unspecified osteoarthritis, unspecified site: Secondary | ICD-10-CM | POA: Insufficient documentation

## 2013-12-26 DIAGNOSIS — Z79899 Other long term (current) drug therapy: Secondary | ICD-10-CM | POA: Insufficient documentation

## 2013-12-26 DIAGNOSIS — I2510 Atherosclerotic heart disease of native coronary artery without angina pectoris: Secondary | ICD-10-CM | POA: Diagnosis not present

## 2013-12-26 DIAGNOSIS — Z87442 Personal history of urinary calculi: Secondary | ICD-10-CM | POA: Diagnosis not present

## 2013-12-26 DIAGNOSIS — R011 Cardiac murmur, unspecified: Secondary | ICD-10-CM | POA: Insufficient documentation

## 2013-12-26 DIAGNOSIS — Z7982 Long term (current) use of aspirin: Secondary | ICD-10-CM | POA: Diagnosis not present

## 2013-12-26 DIAGNOSIS — Z859 Personal history of malignant neoplasm, unspecified: Secondary | ICD-10-CM | POA: Diagnosis not present

## 2013-12-26 DIAGNOSIS — R079 Chest pain, unspecified: Secondary | ICD-10-CM | POA: Diagnosis not present

## 2013-12-26 DIAGNOSIS — I10 Essential (primary) hypertension: Secondary | ICD-10-CM | POA: Insufficient documentation

## 2013-12-26 NOTE — ED Notes (Signed)
Pt arrived to the ED with a complaint of chest pain.  Pt has pain on the left side with no radiation.  Pt has a hx of an anyurism.  Pt states he was pushing his wife in a whell chair and felt that he pulled a muscle.  Pt has used a heating pad, ben gay, and a nitro pill without relief.

## 2013-12-26 NOTE — ED Provider Notes (Signed)
CSN: [id number]     Arrival date & time 12/26/13  2257 History   First MD Initiated Contact with Patient 12/26/13 2322     Chief Complaint  Patient presents with  . Chest Pain     (Consider location/radiation/quality/duration/timing/severity/associated sxs/prior Treatment) HPI  This is a 68 year old male with history of hypertension, known ascending aortic aneurysm, a left bundle branch block who presents with chest pain. Patient reports onset of left-sided dull achy chest pain that started when he reached for something on his nightstand. He relates the chest pain to movement. Currently his pain is 3/10. He denies any associated shortness of breath. Nitroglycerin did not help. He feels like he may for strained a muscle. He denies any ripping or tearing nature to the pain. He reports that he is followed very closely by Dr. Martinique for his aortic aneurysm and also had a catheterization that was "clean."  Past Medical History  Diagnosis Date  . Hypertension   . Coronary artery disease     minimal per cath in 2011  . Nephrolithiasis   . Lactose intolerance   . Ascending aortic aneurysm     Last CT in July 2012  . Left bundle branch block   . Heart murmur   . Arthritis 10-22-11    hx. osteoarthritis-knees  . GERD (gastroesophageal reflux disease)   . Cancer     right kidney  . Dyspnea    Past Surgical History  Procedure Laterality Date  . Knee surgery      arthroscopic  . Total knee arthroplasty      bilateral  . Elbow surgery  10-22-11    right elbow  . Lumbar spine surgery  10-22-11    '03- Lumbar fusion -retained hardware([name])  . Appendectomy    . Cardiac catheterization  12/22/09    EF60%/minimal nonobstructive atherosclerotic coronary artery disease/normal lt ventricular function/mod aortic root dilatation  . Cystoscopy/retrograde/ureteroscopy/stone extraction with basket  10-22-11    1'12  . Partial nephrectomy Right 8/13  . Cystoscopy with retrograde pyelogram,  ureteroscopy and stent placement Left 07/28/2012    Procedure: CYSTOSCOPY WITH RETROGRADE PYELOGRAM, LEFT URETEROSCOPY ;  Surgeon: Dutch Gray, MD;  Location: [facility];  Service: Urology;  Laterality: Left;   Family History  Problem Relation Age of Onset  . Cancer Father     pancreatic  . Heart attack Brother    History  Substance Use Topics  . Smoking status: Never Smoker   . Smokeless tobacco: Never Used  . Alcohol Use: No    Review of Systems  Constitutional: Negative.  Negative for fever.  Respiratory: Positive for chest tightness. Negative for cough and shortness of breath.   Cardiovascular: Positive for chest pain. Negative for leg swelling.  Gastrointestinal: Negative.  Negative for abdominal pain.  Genitourinary: Negative.  Negative for dysuria.  Musculoskeletal: Negative for back pain.  Neurological: Negative for headaches.  All other systems reviewed and are negative.     Allergies  Review of patient's allergies indicates no known allergies.  Home Medications   Prior to Admission medications   Medication Sig Start Date End Date Taking? Authorizing Provider  amLODipine (NORVASC) 5 MG tablet Take 1 tablet (5 mg total) by mouth every morning. 07/02/13  Yes Peter M Martinique, MD  aspirin EC 81 MG tablet Take 81 mg by mouth daily.   Yes Historical Provider, MD  diphenhydrAMINE (BENADRYL) 25 mg capsule Take 25 mg by mouth at bedtime as needed. For sleep   Yes Historical  Provider, MD  doxazosin (CARDURA) 1 MG tablet Take 1 tablet (1 mg total) by mouth at bedtime. 07/02/13  Yes Peter M Martinique, MD  losartan (COZAAR) 100 MG tablet Take 1 tablet (100 mg total) by mouth every morning. 07/02/13  Yes Peter M Martinique, MD  metoprolol succinate (TOPROL-XL) 25 MG 24 hr tablet Take 1 tablet (25 mg total) by mouth at bedtime. 07/02/13  Yes Peter M Martinique, MD  omeprazole (PRILOSEC) 20 MG capsule Take 20 mg by mouth every morning.    Yes Historical Provider, MD  potassium citrate (UROCIT-K) 10 MEQ  (1080 MG) SR tablet Take 20 mEq by mouth 2 (two) times daily.   Yes Historical Provider, MD  pravastatin (PRAVACHOL) 20 MG tablet Take 20 mg by mouth every morning.    Yes Historical Provider, MD   BP 140/78  Pulse 64  Temp(Src) 98 F (36.7 C) (Oral)  Resp 16  Wt 195 lb (88.451 kg)  SpO2 97% Physical Exam  Nursing note and vitals reviewed. Constitutional: He is oriented to person, place, and time. He appears well-developed and well-nourished. No distress.  HENT:  Head: Normocephalic and atraumatic.  Eyes: Pupils are equal, round, and reactive to light.  Cardiovascular: Normal rate, regular rhythm and normal heart sounds.   No murmur heard. Pulmonary/Chest: Effort normal and breath sounds normal. No respiratory distress. He has no wheezes.  Abdominal: Soft. Bowel sounds are normal. There is no tenderness. There is no rebound.  Musculoskeletal: He exhibits no edema.  Neurological: He is alert and oriented to person, place, and time.  Skin: Skin is warm and dry.  Psychiatric: He has a normal mood and affect.    ED Course  Procedures (including critical care time) Labs Review Labs Reviewed  CBC WITH DIFFERENTIAL - Abnormal; Notable for the following:    Platelets 142 (*)    All other components within normal limits  BASIC METABOLIC PANEL - Abnormal; Notable for the following:    Sodium 136 (*)    Glucose, Bld 115 (*)    Creatinine, Ser 1.40 (*)    GFR calc non Af Amer 50 (*)    GFR calc Af Amer 58 (*)    All other components within normal limits  I-STAT TROPOININ, ED  Randolm Idol, ED    Imaging Review Dg Chest 2 View  12/27/2013   CLINICAL DATA:  Chest pain.  EXAM: CHEST  2 VIEW  COMPARISON:  07/13/2013.  FINDINGS: Mediastinum and hilar structures normal. Lungs are clear. Heart size normal. No pleural effusion or pneumothorax. No acute bony abnormality.  IMPRESSION: No active cardiopulmonary disease.   Electronically Signed   By: Marcello Moores  Register   On: 12/27/2013 01:15    Ct Angio Chest W/cm &/or Wo Cm  12/27/2013   CLINICAL DATA:  LEFT-sided chest pain, possible muscle injury from pushing injury. Assess for aortic dissection. Known ascending aortic aneurysm.  EXAM: CT ANGIOGRAPHY CHEST WITH CONTRAST  TECHNIQUE: Multidetector CT imaging of the chest was performed using the standard protocol during bolus administration of intravenous contrast. Multiplanar CT image reconstructions and MIPs were obtained to evaluate the vascular anatomy.  CONTRAST:  124mL OMNIPAQUE IOHEXOL 350 MG/ML SOLN  COMPARISON:  Chest radiograph December 27, 2013 and CT angiogram of the chest July 13, 2013  FINDINGS: Aneurysmal dilatation of the ascending aorta, up to 4.4 x 4.5 cm, unchanged. No aortic dissection, suspicious luminal irregularity, contrast extravasation or periaortic fluid collections. Mild calcific atherosclerosis. Normal 3 vessel aortic arch.  Heart size  is normal. No pericardial fluid collections. Though not tailored for evaluation, no central pulmonary arterial filling defects.  No pleural effusions or focal consolidations. 3 mm sub solid RIGHT middle and RIGHT lower lobe pulmonary nodules, unchanged. Improved aeration of the lungs from prior imaging. Tracheobronchial tree is patent and midline. No pneumothorax.  Included view of the abdomen is nonsuspicious, RIGHT kidney upper pole 15 mm cyst. Stable subcentimeter flash filling hemangioma LEFT lobe of the liver. Soft tissues and osseous structures are nonsuspicious, mild degenerative change of the thoracic spine.  Review of the MIP images confirms the above findings.  IMPRESSION: Stable aneurysmal dilatation of ascending aorta without dissection nor acute vascular injury.  No acute cardiopulmonary process. Improved aeration of lungs from prior imaging.   Electronically Signed   By: Elon Alas   On: 12/27/2013 03:08     EKG Interpretation   Date/Time:  Saturday December 26 2013 23:06:55 EDT Ventricular Rate:  66 PR Interval:   201 QRS Duration: 170 QT Interval:  489 QTC Calculation: 512 R Axis:   -20 Text Interpretation:  Sinus rhythm Left bundle branch block No significant  change since last tracing Confirmed by HORTON  MD, COURTNEY ([id number]) on  12/26/2013 11:21:26 PM      MDM   Final diagnoses:  Chest pain, unspecified chest pain type   Patient presents with chest pain. It is worse with movement. Atypical for ACS.  Patient does have a known stable ascending aortic aneurysm. He denies any ripping or tearing nature to the pain. Vital signs are stable. He has no other symptoms.  EKG shows a stable left bundle branch block. CBC BMP unremarkable. Initial troponin is negative. Given history of aortic aneurysm, CT angiography chest was obtained and shows a stable aneurysm. Delta troponin is negative. Given nature of his pain and history, there may be a musculoskeletal component. However, would advise patient to followup with his cardiologist closely. Patient to use NSAIDs for pain.  After history, exam, and medical workup I feel the patient has been appropriately medically screened and is safe for discharge home. Pertinent diagnoses were discussed with the patient. Patient was given return precautions.   Merryl Hacker, MD 12/27/13 [phone]

## 2013-12-26 NOTE — ED Notes (Signed)
MD at bedside. 

## 2013-12-27 ENCOUNTER — Emergency Department (HOSPITAL_COMMUNITY): Payer: Medicare Other

## 2013-12-27 DIAGNOSIS — R079 Chest pain, unspecified: Secondary | ICD-10-CM | POA: Diagnosis not present

## 2013-12-27 LAB — BASIC METABOLIC PANEL
ANION GAP: 11 (ref 5–15)
BUN: 23 mg/dL (ref 6–23)
CALCIUM: 8.8 mg/dL (ref 8.4–10.5)
CHLORIDE: 101 meq/L (ref 96–112)
CO2: 24 mEq/L (ref 19–32)
CREATININE: 1.4 mg/dL — AB (ref 0.50–1.35)
GFR, EST AFRICAN AMERICAN: 58 mL/min — AB (ref >90)
GFR, EST NON AFRICAN AMERICAN: 50 mL/min — AB (ref >90)
Glucose, Bld: 115 mg/dL — ABNORMAL HIGH (ref 70–99)
Potassium: 4.6 mEq/L (ref 3.7–5.3)
Sodium: 136 mEq/L — ABNORMAL LOW (ref 137–147)

## 2013-12-27 LAB — I-STAT TROPONIN, ED
Troponin i, poc: 0 ng/mL (ref 0.00–0.08)
Troponin i, poc: 0 ng/mL (ref 0.00–0.08)

## 2013-12-27 LAB — CBC WITH DIFFERENTIAL/PLATELET
BASOS ABS: 0 10*3/uL (ref 0.0–0.1)
BASOS PCT: 0 % (ref 0–1)
EOS ABS: 0.1 10*3/uL (ref 0.0–0.7)
Eosinophils Relative: 2 % (ref 0–5)
HEMATOCRIT: 43.1 % (ref 39.0–52.0)
Hemoglobin: 15.3 g/dL (ref 13.0–17.0)
Lymphocytes Relative: 30 % (ref 12–46)
Lymphs Abs: 1.8 10*3/uL (ref 0.7–4.0)
MCH: 32.1 pg (ref 26.0–34.0)
MCHC: 35.5 g/dL (ref 30.0–36.0)
MCV: 90.5 fL (ref 78.0–100.0)
MONO ABS: 0.6 10*3/uL (ref 0.1–1.0)
Monocytes Relative: 10 % (ref 3–12)
Neutro Abs: 3.5 10*3/uL (ref 1.7–7.7)
Neutrophils Relative %: 58 % (ref 43–77)
Platelets: 142 10*3/uL — ABNORMAL LOW (ref 150–400)
RBC: 4.76 MIL/uL (ref 4.22–5.81)
RDW: 12.3 % (ref 11.5–15.5)
WBC: 6 10*3/uL (ref 4.0–10.5)

## 2013-12-27 MED ORDER — IOHEXOL 350 MG/ML SOLN
100.0000 mL | Freq: Once | INTRAVENOUS | Status: AC | PRN
  Administered 2013-12-27: 100 mL via INTRAVENOUS

## 2013-12-27 NOTE — Discharge Instructions (Signed)
You were evaluated tonight for chest pain. Your EKG and heart workup are reassuring. Your aortic aneurysm is stable. Your pain may be musculoskeletal in nature. Take NSAIDs over the next day or 2 for pain management. You need to followup closely with her cardiologist.  Chest Pain (Nonspecific) It is often hard to give a specific diagnosis for the cause of chest pain. There is always a chance that your pain could be related to something serious, such as a heart attack or a blood clot in the lungs. You need to follow up with your health care provider for further evaluation. CAUSES   Heartburn.  Pneumonia or bronchitis.  Anxiety or stress.  Inflammation around your heart (pericarditis) or lung (pleuritis or pleurisy).  A blood clot in the lung.  A collapsed lung (pneumothorax). It can develop suddenly on its own (spontaneous pneumothorax) or from trauma to the chest.  Shingles infection (herpes zoster virus). The chest wall is composed of bones, muscles, and cartilage. Any of these can be the source of the pain.  The bones can be bruised by injury.  The muscles or cartilage can be strained by coughing or overwork.  The cartilage can be affected by inflammation and become sore (costochondritis). DIAGNOSIS  Lab tests or other studies may be needed to find the cause of your pain. Your health care provider may have you take a test called an ambulatory electrocardiogram (ECG). An ECG records your heartbeat patterns over a 24-hour period. You may also have other tests, such as:  Transthoracic echocardiogram (TTE). During echocardiography, sound waves are used to evaluate how blood flows through your heart.  Transesophageal echocardiogram (TEE).  Cardiac monitoring. This allows your health care provider to monitor your heart rate and rhythm in real time.  Holter monitor. This is a portable device that records your heartbeat and can help diagnose heart arrhythmias. It allows your health care  provider to track your heart activity for several days, if needed.  Stress tests by exercise or by giving medicine that makes the heart beat faster. TREATMENT   Treatment depends on what may be causing your chest pain. Treatment may include:  Acid blockers for heartburn.  Anti-inflammatory medicine.  Pain medicine for inflammatory conditions.  Antibiotics if an infection is present.  You may be advised to change lifestyle habits. This includes stopping smoking and avoiding alcohol, caffeine, and chocolate.  You may be advised to keep your head raised (elevated) when sleeping. This reduces the chance of acid going backward from your stomach into your esophagus. Most of the time, nonspecific chest pain will improve within 2-3 days with rest and mild pain medicine.  HOME CARE INSTRUCTIONS   If antibiotics were prescribed, take them as directed. Finish them even if you start to feel better.  For the next few days, avoid physical activities that bring on chest pain. Continue physical activities as directed.  Do not use any tobacco products, including cigarettes, chewing tobacco, or electronic cigarettes.  Avoid drinking alcohol.  Only take medicine as directed by your health care provider.  Follow your health care provider's suggestions for further testing if your chest pain does not go away.  Keep any follow-up appointments you made. If you do not go to an appointment, you could develop lasting (chronic) problems with pain. If there is any problem keeping an appointment, call to reschedule. SEEK MEDICAL CARE IF:   Your chest pain does not go away, even after treatment.  You have a rash with blisters on  your chest.  You have a fever. SEEK IMMEDIATE MEDICAL CARE IF:   You have increased chest pain or pain that spreads to your arm, neck, jaw, back, or abdomen.  You have shortness of breath.  You have an increasing cough, or you cough up blood.  You have severe back or  abdominal pain.  You feel nauseous or vomit.  You have severe weakness.  You faint.  You have chills. This is an emergency. Do not wait to see if the pain will go away. Get medical help at once. Call your local emergency services (911 in [place] Do not drive yourself to the hospital. MAKE SURE YOU:   Understand these instructions.  Will watch your condition.  Will get help right away if you are not doing well or get worse. Document Released: 12/13/2004 Document Revised: 03/10/2013 Document Reviewed: 10/09/2007 [facility] Patient Information 2015 [place], [place]. This information is not intended to replace advice given to you by your health care provider. Make sure you discuss any questions you have with your health care provider.

## 2014-01-07 ENCOUNTER — Other Ambulatory Visit (HOSPITAL_COMMUNITY): Payer: Self-pay | Admitting: Urology

## 2014-01-07 ENCOUNTER — Ambulatory Visit (HOSPITAL_COMMUNITY)
Admission: RE | Admit: 2014-01-07 | Discharge: 2014-01-07 | Disposition: A | Discharge status: Home / Self Care | Payer: Medicare Other | Source: Ambulatory Visit | Attending: Urology | Admitting: Urology

## 2014-01-07 DIAGNOSIS — R05 Cough: Secondary | ICD-10-CM | POA: Insufficient documentation

## 2014-01-07 DIAGNOSIS — N200 Calculus of kidney: Secondary | ICD-10-CM | POA: Diagnosis not present

## 2014-01-07 DIAGNOSIS — C649 Malignant neoplasm of unspecified kidney, except renal pelvis: Secondary | ICD-10-CM

## 2014-01-07 DIAGNOSIS — I10 Essential (primary) hypertension: Secondary | ICD-10-CM | POA: Insufficient documentation

## 2014-01-07 DIAGNOSIS — R0989 Other specified symptoms and signs involving the circulatory and respiratory systems: Secondary | ICD-10-CM | POA: Diagnosis not present

## 2014-01-07 DIAGNOSIS — Z905 Acquired absence of kidney: Secondary | ICD-10-CM | POA: Insufficient documentation

## 2014-01-09 ENCOUNTER — Encounter (HOSPITAL_COMMUNITY): Payer: Self-pay | Admitting: Emergency Medicine

## 2014-01-09 ENCOUNTER — Emergency Department (HOSPITAL_COMMUNITY): Payer: Medicare Other

## 2014-01-09 ENCOUNTER — Emergency Department (HOSPITAL_COMMUNITY)
Admission: EM | Admit: 2014-01-09 | Discharge: 2014-01-09 | Disposition: A | Discharge status: Home / Self Care | Payer: Medicare Other | Attending: Emergency Medicine | Admitting: Emergency Medicine

## 2014-01-09 DIAGNOSIS — Z85528 Personal history of other malignant neoplasm of kidney: Secondary | ICD-10-CM | POA: Insufficient documentation

## 2014-01-09 DIAGNOSIS — Z8639 Personal history of other endocrine, nutritional and metabolic disease: Secondary | ICD-10-CM | POA: Diagnosis not present

## 2014-01-09 DIAGNOSIS — I10 Essential (primary) hypertension: Secondary | ICD-10-CM | POA: Diagnosis not present

## 2014-01-09 DIAGNOSIS — R062 Wheezing: Secondary | ICD-10-CM | POA: Diagnosis not present

## 2014-01-09 DIAGNOSIS — Z7982 Long term (current) use of aspirin: Secondary | ICD-10-CM | POA: Insufficient documentation

## 2014-01-09 DIAGNOSIS — Z9889 Other specified postprocedural states: Secondary | ICD-10-CM | POA: Diagnosis not present

## 2014-01-09 DIAGNOSIS — R05 Cough: Secondary | ICD-10-CM | POA: Diagnosis not present

## 2014-01-09 DIAGNOSIS — K219 Gastro-esophageal reflux disease without esophagitis: Secondary | ICD-10-CM | POA: Diagnosis not present

## 2014-01-09 DIAGNOSIS — I2510 Atherosclerotic heart disease of native coronary artery without angina pectoris: Secondary | ICD-10-CM | POA: Diagnosis not present

## 2014-01-09 DIAGNOSIS — Z79899 Other long term (current) drug therapy: Secondary | ICD-10-CM | POA: Insufficient documentation

## 2014-01-09 DIAGNOSIS — R011 Cardiac murmur, unspecified: Secondary | ICD-10-CM | POA: Insufficient documentation

## 2014-01-09 DIAGNOSIS — Z87442 Personal history of urinary calculi: Secondary | ICD-10-CM | POA: Diagnosis not present

## 2014-01-09 DIAGNOSIS — M179 Osteoarthritis of knee, unspecified: Secondary | ICD-10-CM | POA: Diagnosis not present

## 2014-01-09 MED ORDER — DEXAMETHASONE SODIUM PHOSPHATE 10 MG/ML IJ SOLN
10.0000 mg | Freq: Once | INTRAMUSCULAR | Status: AC
  Administered 2014-01-09: 10 mg via INTRAMUSCULAR
  Filled 2014-01-09: qty 1

## 2014-01-09 MED ORDER — IPRATROPIUM BROMIDE 0.02 % IN SOLN
0.5000 mg | Freq: Once | RESPIRATORY_TRACT | Status: AC
  Administered 2014-01-09: 0.5 mg via RESPIRATORY_TRACT
  Filled 2014-01-09: qty 2.5

## 2014-01-09 MED ORDER — ALBUTEROL SULFATE HFA 108 (90 BASE) MCG/ACT IN AERS
2.0000 | INHALATION_SPRAY | RESPIRATORY_TRACT | Status: DC | PRN
  Administered 2014-01-09: 2 via RESPIRATORY_TRACT
  Filled 2014-01-09: qty 6.7

## 2014-01-09 MED ORDER — ALBUTEROL SULFATE (2.5 MG/3ML) 0.083% IN NEBU
5.0000 mg | INHALATION_SOLUTION | Freq: Once | RESPIRATORY_TRACT | Status: AC
  Administered 2014-01-09: 5 mg via RESPIRATORY_TRACT
  Filled 2014-01-09: qty 6

## 2014-01-09 NOTE — ED Notes (Signed)
Pt reports he is leaving with ALL belongings that he arrived with. He is alert and oriented upon DC.

## 2014-01-09 NOTE — Discharge Instructions (Signed)
How to Use an Inhaler Proper inhaler technique is very important. Good technique ensures that the medicine reaches the lungs. Poor technique results in depositing the medicine on the tongue and back of the throat rather than in the airways. If you do not use the inhaler with good technique, the medicine will not help you. STEPS TO FOLLOW IF USING AN INHALER WITHOUT AN EXTENSION TUBE 1. Remove the cap from the inhaler. 2. If you are using the inhaler for the first time, you will need to prime it. Shake the inhaler for 5 seconds and release four puffs into the air, away from your face. Ask your health care provider or pharmacist if you have questions about priming your inhaler. 3. Shake the inhaler for 5 seconds before each breath in (inhalation). 4. Position the inhaler so that the top of the canister faces up. 5. Put your index finger on the top of the medicine canister. Your thumb supports the bottom of the inhaler. 6. Open your mouth. 7. Either place the inhaler between your teeth and place your lips tightly around the mouthpiece, or hold the inhaler 1-2 inches away from your open mouth. If you are unsure of which technique to use, ask your health care provider. 8. Breathe out (exhale) normally and as completely as possible. 9. Press the canister down with your index finger to release the medicine. 10. At the same time as the canister is pressed, inhale deeply and slowly until your lungs are completely filled. This should take 4-6 seconds. Keep your tongue down. 11. Hold the medicine in your lungs for 5-10 seconds (10 seconds is best). This helps the medicine get into the small airways of your lungs. 12. Breathe out slowly, through pursed lips. Whistling is an example of pursed lips. 13. Wait at least 15-30 seconds between puffs. Continue with the above steps until you have taken the number of puffs your health care provider has ordered. Do not use the inhaler more than your health care provider  tells you. 14. Replace the cap on the inhaler. 15. Follow the directions from your health care provider or the inhaler insert for cleaning the inhaler. STEPS TO FOLLOW IF USING AN INHALER WITH AN EXTENSION (SPACER) 1. Remove the cap from the inhaler. 2. If you are using the inhaler for the first time, you will need to prime it. Shake the inhaler for 5 seconds and release four puffs into the air, away from your face. Ask your health care provider or pharmacist if you have questions about priming your inhaler. 3. Shake the inhaler for 5 seconds before each breath in (inhalation). 4. Place the open end of the spacer onto the mouthpiece of the inhaler. 5. Position the inhaler so that the top of the canister faces up and the spacer mouthpiece faces you. 6. Put your index finger on the top of the medicine canister. Your thumb supports the bottom of the inhaler and the spacer. 7. Breathe out (exhale) normally and as completely as possible. 8. Immediately after exhaling, place the spacer between your teeth and into your mouth. Close your lips tightly around the spacer. 9. Press the canister down with your index finger to release the medicine. 10. At the same time as the canister is pressed, inhale deeply and slowly until your lungs are completely filled. This should take 4-6 seconds. Keep your tongue down and out of the way. 11. Hold the medicine in your lungs for 5-10 seconds (10 seconds is best). This helps the   medicine get into the small airways of your lungs. Exhale. 12. Repeat inhaling deeply through the spacer mouthpiece. Again hold that breath for up to 10 seconds (10 seconds is best). Exhale slowly. If it is difficult to take this second deep breath through the spacer, breathe normally several times through the spacer. Remove the spacer from your mouth. 13. Wait at least 15-30 seconds between puffs. Continue with the above steps until you have taken the number of puffs your health care provider has  ordered. Do not use the inhaler more than your health care provider tells you. 14. Remove the spacer from the inhaler, and place the cap on the inhaler. 15. Follow the directions from your health care provider or the inhaler insert for cleaning the inhaler and spacer. If you are using different kinds of inhalers, use your quick relief medicine to open the airways 10-15 minutes before using a steroid if instructed to do so by your health care provider. If you are unsure which inhalers to use and the order of using them, ask your health care provider, nurse, or respiratory therapist. If you are using a steroid inhaler, always rinse your mouth with water after your last puff, then gargle and spit out the water. Do not swallow the water. AVOID:  Inhaling before or after starting the spray of medicine. It takes practice to coordinate your breathing with triggering the spray.  Inhaling through the nose (rather than the mouth) when triggering the spray. HOW TO DETERMINE IF YOUR INHALER IS FULL OR NEARLY EMPTY You cannot know when an inhaler is empty by shaking it. A few inhalers are now being made with dose counters. Ask your health care provider for a prescription that has a dose counter if you feel you need that extra help. If your inhaler does not have a counter, ask your health care provider to help you determine the date you need to refill your inhaler. Write the refill date on a calendar or your inhaler canister. Refill your inhaler 7-10 days before it runs out. Be sure to keep an adequate supply of medicine. This includes making sure it is not expired, and that you have a spare inhaler.  SEEK MEDICAL CARE IF:   Your symptoms are only partially relieved with your inhaler.  You are having trouble using your inhaler.  You have some increase in phlegm. SEEK IMMEDIATE MEDICAL CARE IF:   You feel little or no relief with your inhalers. You are still wheezing and are feeling shortness of breath or  tightness in your chest or both.  You have dizziness, headaches, or a fast heart rate.  You have chills, fever, or night sweats.  You have a noticeable increase in phlegm production, or there is blood in the phlegm. MAKE SURE YOU:   Understand these instructions.  Will watch your condition.  Will get help right away if you are not doing well or get worse. Document Released: 03/02/2000 Document Revised: 12/24/2012 Document Reviewed: 10/02/2012 [employer] Patient Information 2015 [employer]. This information is not intended to replace advice given to you by your health care provider. Make sure you discuss any questions you have with your health care provider.  

## 2014-01-09 NOTE — ED Provider Notes (Signed)
CSN: [id number]     Arrival date & time 01/09/14  8101 History   First MD Initiated Contact with Patient 01/09/14 [phone]     Chief Complaint  Patient presents with  . Cough  . Wheezing      HPI Pt from home c/o wheezing and a nonproductive cough x 1 week. He had labs and CXR performed on Thursday but patient does not know result of CXR or labs. Pt is concerned that he may have pneumonia. Denies fever, pain, nausea or vomiting. He has been taking mucinex without relief.  Past Medical History  Diagnosis Date  . Hypertension   . Coronary artery disease     minimal per cath in 2011  . Nephrolithiasis   . Lactose intolerance   . Ascending aortic aneurysm     Last CT in July 2012  . Left bundle branch block   . Heart murmur   . Arthritis 10-22-11    hx. osteoarthritis-knees  . GERD (gastroesophageal reflux disease)   . Cancer     right kidney  . Dyspnea    Past Surgical History  Procedure Laterality Date  . Knee surgery      arthroscopic  . Total knee arthroplasty      bilateral  . Elbow surgery  10-22-11    right elbow  . Lumbar spine surgery  10-22-11    '03- Lumbar fusion -retained hardware([name])  . Appendectomy    . Cardiac catheterization  12/22/09    EF60%/minimal nonobstructive atherosclerotic coronary artery disease/normal lt ventricular function/mod aortic root dilatation  . Cystoscopy/retrograde/ureteroscopy/stone extraction with basket  10-22-11    1'12  . Partial nephrectomy Right 8/13  . Cystoscopy with retrograde pyelogram, ureteroscopy and stent placement Left 07/28/2012    Procedure: CYSTOSCOPY WITH RETROGRADE PYELOGRAM, LEFT URETEROSCOPY ;  Surgeon: Dutch Gray, MD;  Location: [facility];  Service: Urology;  Laterality: Left;   Family History  Problem Relation Age of Onset  . Cancer Father     pancreatic  . Heart attack Brother    History  Substance Use Topics  . Smoking status: Never Smoker   . Smokeless tobacco: Never Used  . Alcohol Use: No    Review  of Systems All other systems reviewed and are negative   Allergies  Review of patient's allergies indicates no known allergies.  Home Medications   Prior to Admission medications   Medication Sig Start Date End Date Taking? Authorizing Provider  aspirin EC 81 MG tablet Take 81 mg by mouth daily.   Yes Historical Provider, MD  diphenhydrAMINE (BENADRYL) 25 mg capsule Take 25 mg by mouth at bedtime as needed. For sleep   Yes Historical Provider, MD  doxazosin (CARDURA) 1 MG tablet Take 1 mg by mouth at bedtime.   Yes Historical Provider, MD  metoprolol succinate (TOPROL-XL) 25 MG 24 hr tablet Take 25 mg by mouth every evening.   Yes Historical Provider, MD  omeprazole (PRILOSEC) 20 MG capsule Take 20 mg by mouth every morning.    Yes Historical Provider, MD  potassium citrate (UROCIT-K) 10 MEQ (1080 MG) SR tablet Take 20 mEq by mouth 2 (two) times daily.   Yes Historical Provider, MD  pravastatin (PRAVACHOL) 20 MG tablet Take 20 mg by mouth every morning.    Yes Historical Provider, MD   BP 140/71  Pulse 73  Temp(Src) 98.1 F (36.7 C) (Oral)  Resp 18  Ht 5\' 11"  (1.803 m)  Wt 195 lb (88.451 kg)  BMI 27.21 kg/m2  SpO2 96% Physical Exam Physical Exam  Nursing note and vitals reviewed. Constitutional: He is oriented to person, place, and time. He appears well-developed and well-nourished. No distress.  HENT:  Head: Normocephalic and atraumatic.  Eyes: Pupils are equal, round, and reactive to light.  Neck: Normal range of motion.  Cardiovascular: Normal rate and intact distal pulses.   Pulmonary/Chest: No respiratory distress.  expiratory wheezes noted on auscultation bilaterally. Abdominal: Normal appearance. He exhibits no distension.  Musculoskeletal: Normal range of motion.  Neurological: He is alert and oriented to person, place, and time. No cranial nerve deficit.  Skin: Skin is warm and dry. No rash noted.  Psychiatric: He has a normal mood and affect. His behavior is  normal.   ED Course  Procedures (including critical care time) Medications  dexamethasone (DECADRON) injection 10 mg (not administered)  albuterol (PROVENTIL HFA;VENTOLIN HFA) 108 (90 BASE) MCG/ACT inhaler 2 puff (not administered)  albuterol (PROVENTIL) (2.5 MG/3ML) 0.083% nebulizer solution 5 mg (5 mg Nebulization Given 01/09/14 0944)  ipratropium (ATROVENT) nebulizer solution 0.5 mg (0.5 mg Nebulization Given 01/09/14 0944)    Labs Review Labs Reviewed - No data to display  Imaging Review Dg Chest 2 View  01/09/2014   CLINICAL DATA:  Wheezing for 1 week.  Hypertension.  EXAM: CHEST  2 VIEW  COMPARISON:  01/07/2014.  FINDINGS: Heart, mediastinum hila are unremarkable. Clear lungs. No pleural effusion or pneumothorax.  Bony thorax is intact.  IMPRESSION: No active cardiopulmonary disease.   Electronically Signed   By: Lajean Manes M.D.   On: 01/09/2014 10:25    After treatment in the ED the patient feels back to baseline and wants to go home.  MDM   Final diagnoses:  Wheezing        Dot Lanes, MD 01/09/14 [phone]

## 2014-01-09 NOTE — ED Notes (Signed)
Pt from home c/o wheezing and a nonproductive cough x 1 week. He had labs and CXR performed on Thursday but patient does not know result of CXR or labs. Pt is concerned that he may have pneumonia. Denies fever, pain, nausea or vomiting. He has been taking mucinex without relief.

## 2014-01-09 NOTE — ED Notes (Signed)
Md Audie Pinto at bedside.

## 2014-01-09 NOTE — ED Notes (Signed)
Pt transported to XRAY °

## 2014-01-13 DIAGNOSIS — I10 Essential (primary) hypertension: Secondary | ICD-10-CM | POA: Diagnosis not present

## 2014-01-13 DIAGNOSIS — E785 Hyperlipidemia, unspecified: Secondary | ICD-10-CM | POA: Diagnosis not present

## 2014-01-13 DIAGNOSIS — Z1389 Encounter for screening for other disorder: Secondary | ICD-10-CM | POA: Diagnosis not present

## 2014-01-13 DIAGNOSIS — Z0000 Encounter for general adult medical examination without abnormal findings: Secondary | ICD-10-CM | POA: Diagnosis not present

## 2014-01-13 DIAGNOSIS — R739 Hyperglycemia, unspecified: Secondary | ICD-10-CM | POA: Diagnosis not present

## 2014-01-13 DIAGNOSIS — N183 Chronic kidney disease, stage 3 (moderate): Secondary | ICD-10-CM | POA: Diagnosis not present

## 2014-01-13 DIAGNOSIS — Z23 Encounter for immunization: Secondary | ICD-10-CM | POA: Diagnosis not present

## 2014-01-13 DIAGNOSIS — N200 Calculus of kidney: Secondary | ICD-10-CM | POA: Diagnosis not present

## 2014-01-13 DIAGNOSIS — R05 Cough: Secondary | ICD-10-CM | POA: Diagnosis not present

## 2014-01-18 DIAGNOSIS — N201 Calculus of ureter: Secondary | ICD-10-CM | POA: Diagnosis not present

## 2014-01-27 DIAGNOSIS — H43813 Vitreous degeneration, bilateral: Secondary | ICD-10-CM | POA: Diagnosis not present

## 2014-01-27 DIAGNOSIS — H2513 Age-related nuclear cataract, bilateral: Secondary | ICD-10-CM | POA: Diagnosis not present

## 2014-02-01 DIAGNOSIS — N201 Calculus of ureter: Secondary | ICD-10-CM | POA: Diagnosis not present

## 2014-04-02 DIAGNOSIS — N401 Enlarged prostate with lower urinary tract symptoms: Secondary | ICD-10-CM | POA: Diagnosis not present

## 2014-04-02 DIAGNOSIS — R3916 Straining to void: Secondary | ICD-10-CM | POA: Diagnosis not present

## 2014-04-02 DIAGNOSIS — N201 Calculus of ureter: Secondary | ICD-10-CM | POA: Diagnosis not present

## 2014-04-02 DIAGNOSIS — N200 Calculus of kidney: Secondary | ICD-10-CM | POA: Diagnosis not present

## 2014-05-10 ENCOUNTER — Other Ambulatory Visit: Payer: Self-pay | Admitting: Dermatology

## 2014-05-10 DIAGNOSIS — L853 Xerosis cutis: Secondary | ICD-10-CM | POA: Diagnosis not present

## 2014-05-10 DIAGNOSIS — D1722 Benign lipomatous neoplasm of skin and subcutaneous tissue of left arm: Secondary | ICD-10-CM | POA: Diagnosis not present

## 2014-05-10 DIAGNOSIS — Z85820 Personal history of malignant melanoma of skin: Secondary | ICD-10-CM | POA: Diagnosis not present

## 2014-05-10 DIAGNOSIS — D2261 Melanocytic nevi of right upper limb, including shoulder: Secondary | ICD-10-CM | POA: Diagnosis not present

## 2014-05-10 DIAGNOSIS — L814 Other melanin hyperpigmentation: Secondary | ICD-10-CM | POA: Diagnosis not present

## 2014-05-10 DIAGNOSIS — D485 Neoplasm of uncertain behavior of skin: Secondary | ICD-10-CM | POA: Diagnosis not present

## 2014-05-10 DIAGNOSIS — L821 Other seborrheic keratosis: Secondary | ICD-10-CM | POA: Diagnosis not present

## 2014-05-10 DIAGNOSIS — L239 Allergic contact dermatitis, unspecified cause: Secondary | ICD-10-CM | POA: Diagnosis not present

## 2014-05-10 DIAGNOSIS — L986 Other infiltrative disorders of the skin and subcutaneous tissue: Secondary | ICD-10-CM | POA: Diagnosis not present

## 2014-05-10 DIAGNOSIS — D1801 Hemangioma of skin and subcutaneous tissue: Secondary | ICD-10-CM | POA: Diagnosis not present

## 2014-05-27 ENCOUNTER — Encounter: Payer: Self-pay | Admitting: Cardiology

## 2014-05-27 ENCOUNTER — Ambulatory Visit (INDEPENDENT_AMBULATORY_CARE_PROVIDER_SITE_OTHER): Payer: Medicare Other | Admitting: Cardiology

## 2014-05-27 VITALS — BP 126/78 | HR 72 | Ht 71.5 in | Wt 208.8 lb

## 2014-05-27 DIAGNOSIS — I712 Thoracic aortic aneurysm, without rupture, unspecified: Secondary | ICD-10-CM

## 2014-05-27 DIAGNOSIS — R0602 Shortness of breath: Secondary | ICD-10-CM

## 2014-05-27 DIAGNOSIS — I10 Essential (primary) hypertension: Secondary | ICD-10-CM

## 2014-05-27 NOTE — Patient Instructions (Signed)
Continue your current therapy  I will see you in 6 months.   

## 2014-05-27 NOTE — Progress Notes (Signed)
Corey [name] Date of Birth: 06-09-1945 Medical Record #[id number]  History of Present Illness: Corey [name] is seen for follow up of dyspnea.   He has a history of HTN and thoracic aortic aneurysm. He has  chronic kidney disease.   He has a history of minimal coronary artery disease per prior cath in 2011. Prior PFTs are normal. He is followed by pulmonary. He has been exercising regularly and thinks that this has really helped with his breathing. His SOB is more sporadic. He notes he may get SOB walking [place] [facility] but then can walk on a treadmill without difficulty. His last CT in October showed stable thoracic aneurysm at 4.5 cm. He had some mild dependent ground glass changes. Overall he feels he is doing better.   Current Outpatient Prescriptions on File Prior to Visit  Medication Sig Dispense Refill  . aspirin EC 81 MG tablet Take 81 mg by mouth daily.    . diphenhydrAMINE (BENADRYL) 25 mg capsule Take 25 mg by mouth at bedtime as needed. For sleep    . doxazosin (CARDURA) 1 MG tablet Take 1 mg by mouth at bedtime.    . metoprolol succinate (TOPROL-XL) 25 MG 24 hr tablet Take 25 mg by mouth every evening.    Marland Kitchen omeprazole (PRILOSEC) 20 MG capsule Take 20 mg by mouth every morning.     . potassium citrate (UROCIT-K) 10 MEQ (1080 MG) SR tablet Take 20 mEq by mouth 2 (two) times daily.    . pravastatin (PRAVACHOL) 20 MG tablet Take 20 mg by mouth every morning.      No current facility-administered medications on file prior to visit.    No Known Allergies  Past Medical History  Diagnosis Date  . Hypertension   . Coronary artery disease     minimal per cath in 2011  . Nephrolithiasis   . Lactose intolerance   . Ascending aortic aneurysm     Last CT in July 2012  . Left bundle branch block   . Heart murmur   . Arthritis 10-22-11    hx. osteoarthritis-knees  . GERD (gastroesophageal reflux disease)   . Cancer     right kidney  . Dyspnea     Past Surgical History   Procedure Laterality Date  . Knee surgery      arthroscopic  . Total knee arthroplasty      bilateral  . Elbow surgery  10-22-11    right elbow  . Lumbar spine surgery  10-22-11    '03- Lumbar fusion -retained hardware([name])  . Appendectomy    . Cardiac catheterization  12/22/09    EF60%/minimal nonobstructive atherosclerotic coronary artery disease/normal lt ventricular function/mod aortic root dilatation  . Cystoscopy/retrograde/ureteroscopy/stone extraction with basket  10-22-11    1'12  . Partial nephrectomy Right 8/13  . Cystoscopy with retrograde pyelogram, ureteroscopy and stent placement Left 07/28/2012    Procedure: CYSTOSCOPY WITH RETROGRADE PYELOGRAM, LEFT URETEROSCOPY ;  Surgeon: Dutch Gray, MD;  Location: [facility];  Service: Urology;  Laterality: Left;    History  Smoking status  . Never Smoker   Smokeless tobacco  . Never Used    History  Alcohol Use No    Family History  Problem Relation Age of Onset  . Cancer Father     pancreatic  . Heart attack Brother     Review of Systems: As noted in HPI. All other systems were reviewed and are negative.  Physical Exam: BP 126/78 mmHg  Pulse 72  Ht 5' 11.5" (1.816 m)  Wt 208 lb 12.8 oz (94.711 kg)  BMI 28.72 kg/m2 Patient is  pleasant and in no acute distress.  Skin is warm and dry. Color is normal.  HEENT is unremarkable. Normocephalic/atraumatic. PERRL. Sclera are nonicteric. Neck is supple. No masses. No JVD or bruits. Lungs are clear. Cardiac exam shows a regular rate and rhythm. Normal S1-2. No murmur or gallop. Abdomen is soft. Extremities are without edema. Gait and ROM are intact. No gross neurologic deficits noted.   LABORATORY DATA:  Lab Results  Component Value Date   WBC 6.0 12/26/2013   HGB 15.3 12/26/2013   HCT 43.1 12/26/2013   PLT 142* 12/26/2013   GLUCOSE 115* 12/26/2013   ALT 29 08/27/2006   AST 23 08/27/2006   NA 136* 12/26/2013   K 4.6 12/26/2013   CL 101 12/26/2013   CREATININE 1.40*  12/26/2013   BUN 23 12/26/2013   CO2 24 12/26/2013   INR 1.4 09/04/2006    CHEST 2 VIEW  COMPARISON: 12/18/2012  FINDINGS:  The heart size and mediastinal contours are within normal limits.  Both lungs are clear. The visualized skeletal structures are  unremarkable. Bilateral nipple shadows are noted.  IMPRESSION:  No active cardiopulmonary disease.  Electronically Signed  By: Franchot Gallo M.D.  On: 07/10/2013 09:45  CT ANGIOGRAPHY CHEST WITH CONTRAST  TECHNIQUE:  Multidetector CT imaging of the chest was performed using the  standard protocol during bolus administration of intravenous  contrast. Multiplanar CT image reconstructions and MIPs were  obtained to evaluate the vascular anatomy.  CONTRAST: 158mL OMNIPAQUE IOHEXOL 350 MG/ML SOLN  COMPARISON: DG CHEST 2 VIEW dated 07/10/2013; CT ANGIO CHEST W/CM  &/OR WO/CM dated 11/29/2010; CT ABD-PEL WO/W CM dated 12/19/2012; CT  ANGIO CHEST W/CM &/OR WO/CM dated 12/16/2009; CT UROGRAM dated  12/13/2009; CT UROGRAM dated 05/30/2007  FINDINGS:  Stable approximately 4.5cm maximum diameter ascending aortic  aneurysm is present. Aortic arch and descending aorta are stable and  mildly ectatic. No evidence of dissection. No evidence of aneurysm  progression. Great vessels patent. Cardiomegaly. Coronary artery  disease. No pulmonary embolus.  Thoracic esophagus is unremarkable. No significant mediastinal hilar  adenopathy noted.  Large airways are patent. Bilateral pulmonary interstitial  prominence noted. Congestive heart failure with interstitial  pulmonary edema could present this fashion. Interstitial pneumonitis  cannot be excluded. Tiny pleural effusions cannot be excluded.  Visualize adrenals normal. Tiny enhancing nodule right hepatic lobe,  most likely tiny hemangioma.  Thyroid unremarkable. No supraclavicular or pathologic axillary  adenopathy noted. Chest wall is intact. No acute bony abnormality.  Degenerative changes thoracic  spine.  Review of the MIP images confirms the above findings.  IMPRESSION:  1. Stable ascending thoracic aortic aneurysm with maximum diameter  4.5 cm.  2. Findings suggesting congestive heart failure and mild pulmonary  interstitial edema. Interstitial pneumonitis cannot be excluded.  Electronically Signed  By: Marcello Moores Register  On: 07/13/2013 11:48    Echo: 08/03/13:Study Conclusions  - Left ventricle: Abnormal septal motion. The cavity size was normal. Wall thickness was increased in a pattern of moderate LVH. Systolic function was normal. The estimated ejection fraction was in the range of 50% to 55%. Doppler parameters are consistent with abnormal left ventricular relaxation (grade 1 diastolic dysfunction). - Atrial septum: No defect or patent foramen ovale was identified.  Ecg today shows NSR with LBBB. I have personally reviewed and interpreted this study.    Assessment / Plan: 1. HTN-  BP under good control.   2. Thoracic aortic aneurysm. Unchanged at 4.5 cm. Repeat at one year.   3. CAD- minimal by prior cath.  4. LBBB chronic.  5. Dyspnea. No clear etiology based on prior cardiac and pulmonary work up. Symptoms clinically improved with exercise program. Echo is not consistent with HOCM (question raised by pulmonary). Will continue current therapy. If symptoms worsen may need to consider CPX.   I will follow up in 6 months.

## 2014-06-23 ENCOUNTER — Other Ambulatory Visit: Payer: Self-pay

## 2014-06-23 MED ORDER — METOPROLOL SUCCINATE ER 25 MG PO TB24: 25.0000 mg | ORAL_TABLET | Freq: Every evening | ORAL | Status: DC

## 2014-06-23 MED ORDER — AMLODIPINE BESYLATE 5 MG PO TABS: 5.0000 mg | ORAL_TABLET | Freq: Every day | ORAL | Status: DC

## 2014-06-23 MED ORDER — LOSARTAN POTASSIUM 100 MG PO TABS: 100.0000 mg | ORAL_TABLET | Freq: Every day | ORAL | Status: DC

## 2014-10-31 ENCOUNTER — Emergency Department (HOSPITAL_COMMUNITY): Payer: Medicare Other

## 2014-10-31 ENCOUNTER — Emergency Department (HOSPITAL_COMMUNITY)
Admission: EM | Admit: 2014-10-31 | Discharge: 2014-10-31 | Disposition: A | Discharge status: Home / Self Care | Payer: Medicare Other | Attending: Emergency Medicine | Admitting: Emergency Medicine

## 2014-10-31 ENCOUNTER — Encounter (HOSPITAL_COMMUNITY): Payer: Self-pay | Admitting: Emergency Medicine

## 2014-10-31 DIAGNOSIS — Z87442 Personal history of urinary calculi: Secondary | ICD-10-CM | POA: Diagnosis not present

## 2014-10-31 DIAGNOSIS — R410 Disorientation, unspecified: Secondary | ICD-10-CM | POA: Diagnosis not present

## 2014-10-31 DIAGNOSIS — I2510 Atherosclerotic heart disease of native coronary artery without angina pectoris: Secondary | ICD-10-CM | POA: Diagnosis not present

## 2014-10-31 DIAGNOSIS — Z85528 Personal history of other malignant neoplasm of kidney: Secondary | ICD-10-CM | POA: Insufficient documentation

## 2014-10-31 DIAGNOSIS — I671 Cerebral aneurysm, nonruptured: Secondary | ICD-10-CM | POA: Diagnosis not present

## 2014-10-31 DIAGNOSIS — R011 Cardiac murmur, unspecified: Secondary | ICD-10-CM | POA: Diagnosis not present

## 2014-10-31 DIAGNOSIS — Z8639 Personal history of other endocrine, nutritional and metabolic disease: Secondary | ICD-10-CM | POA: Diagnosis not present

## 2014-10-31 DIAGNOSIS — Z7982 Long term (current) use of aspirin: Secondary | ICD-10-CM | POA: Insufficient documentation

## 2014-10-31 DIAGNOSIS — Z9889 Other specified postprocedural states: Secondary | ICD-10-CM | POA: Diagnosis not present

## 2014-10-31 DIAGNOSIS — M1990 Unspecified osteoarthritis, unspecified site: Secondary | ICD-10-CM | POA: Diagnosis not present

## 2014-10-31 DIAGNOSIS — I10 Essential (primary) hypertension: Secondary | ICD-10-CM | POA: Diagnosis not present

## 2014-10-31 DIAGNOSIS — R42 Dizziness and giddiness: Secondary | ICD-10-CM | POA: Diagnosis not present

## 2014-10-31 DIAGNOSIS — K219 Gastro-esophageal reflux disease without esophagitis: Secondary | ICD-10-CM | POA: Insufficient documentation

## 2014-10-31 DIAGNOSIS — H538 Other visual disturbances: Secondary | ICD-10-CM | POA: Diagnosis not present

## 2014-10-31 DIAGNOSIS — Z79899 Other long term (current) drug therapy: Secondary | ICD-10-CM | POA: Diagnosis not present

## 2014-10-31 LAB — CBC WITH DIFFERENTIAL/PLATELET
Basophils Absolute: 0 10*3/uL (ref 0.0–0.1)
Basophils Relative: 1 % (ref 0–1)
EOS PCT: 3 % (ref 0–5)
Eosinophils Absolute: 0.2 10*3/uL (ref 0.0–0.7)
HCT: 46.2 % (ref 39.0–52.0)
HEMOGLOBIN: 16.3 g/dL (ref 13.0–17.0)
Lymphocytes Relative: 36 % (ref 12–46)
Lymphs Abs: 1.6 10*3/uL (ref 0.7–4.0)
MCH: 32.1 pg (ref 26.0–34.0)
MCHC: 35.3 g/dL (ref 30.0–36.0)
MCV: 91.1 fL (ref 78.0–100.0)
Monocytes Absolute: 0.6 10*3/uL (ref 0.1–1.0)
Monocytes Relative: 13 % — ABNORMAL HIGH (ref 3–12)
NEUTROS PCT: 47 % (ref 43–77)
Neutro Abs: 2.1 10*3/uL (ref 1.7–7.7)
Platelets: 173 10*3/uL (ref 150–400)
RBC: 5.07 MIL/uL (ref 4.22–5.81)
RDW: 12.4 % (ref 11.5–15.5)
WBC: 4.5 10*3/uL (ref 4.0–10.5)

## 2014-10-31 LAB — BASIC METABOLIC PANEL
Anion gap: 5 (ref 5–15)
BUN: 25 mg/dL — AB (ref 6–20)
CHLORIDE: 106 mmol/L (ref 101–111)
CO2: 27 mmol/L (ref 22–32)
Calcium: 9.3 mg/dL (ref 8.9–10.3)
Creatinine, Ser: 1.59 mg/dL — ABNORMAL HIGH (ref 0.61–1.24)
GFR calc Af Amer: 49 mL/min — ABNORMAL LOW (ref >60)
GFR, EST NON AFRICAN AMERICAN: 43 mL/min — AB (ref >60)
Glucose, Bld: 99 mg/dL (ref 65–99)
Potassium: 4.8 mmol/L (ref 3.5–5.1)
SODIUM: 138 mmol/L (ref 135–145)

## 2014-10-31 MED ORDER — IOHEXOL 350 MG/ML SOLN
100.0000 mL | Freq: Once | INTRAVENOUS | Status: AC | PRN
  Administered 2014-10-31: 80 mL via INTRAVENOUS

## 2014-10-31 NOTE — ED Notes (Signed)
Pt denied questions, concerns r/t dc. He is ambulatory and a&ox4

## 2014-10-31 NOTE — ED Notes (Addendum)
Pt reports sudden onset bilateral blurred vision that began at 1000 along with vertigo. Pt had been working out in the heat yesterday and had some dizziness with bending/standing prior to incident. No extremity drift or facial droop. Wife states pt is more confused than normal.

## 2014-10-31 NOTE — ED Provider Notes (Signed)
CSN: [id number]     Arrival date & time 10/31/14  1035 History   First MD Initiated Contact with Patient 10/31/14 1054     Chief Complaint  Patient presents with  . Dizziness     (Consider location/radiation/quality/duration/timing/severity/associated sxs/prior Treatment) HPI Comments: 69 year old male with past medical history including hypertension, CAD, ascending aortic aneurysm, chronic dyspnea who presents with dizziness. The patient states that this morning as can his wife were driving to church, he had a sudden onset of dizziness associated with blurry vision. He states that he felt off balance and had difficulty focusing his vision. He felt like he lost control of the car a few times before arriving at church. Wife states that he slept for part of church. No seizure-like activity. No extremity numbness or weakness. He has been able to ambulate although he feels slightly off balance when doing so. Wife stated at triage that he seems more confused than normal. The patient does note that he was out working in the heat yesterday. He does have episodes of shortness of breath while he is in the heat and has had an extensive workup for these episodes without clear diagnosis. He denies any chest pain or shortness of breath today.  Patient is a 69 y.o. male presenting with dizziness. The history is provided by the patient.  Dizziness   Past Medical History  Diagnosis Date  . Hypertension   . Coronary artery disease     minimal per cath in 2011  . Nephrolithiasis   . Lactose intolerance   . Ascending aortic aneurysm     Last CT in July 2012  . Left bundle branch block   . Heart murmur   . Arthritis 10-22-11    hx. osteoarthritis-knees  . GERD (gastroesophageal reflux disease)   . Cancer     right kidney  . Dyspnea    Past Surgical History  Procedure Laterality Date  . Knee surgery      arthroscopic  . Total knee arthroplasty      bilateral  . Elbow surgery  10-22-11    right elbow   . Lumbar spine surgery  10-22-11    '03- Lumbar fusion -retained hardware([name])  . Appendectomy    . Cardiac catheterization  12/22/09    EF60%/minimal nonobstructive atherosclerotic coronary artery disease/normal lt ventricular function/mod aortic root dilatation  . Cystoscopy/retrograde/ureteroscopy/stone extraction with basket  10-22-11    1'12  . Partial nephrectomy Right 8/13  . Cystoscopy with retrograde pyelogram, ureteroscopy and stent placement Left 07/28/2012    Procedure: CYSTOSCOPY WITH RETROGRADE PYELOGRAM, LEFT URETEROSCOPY ;  Surgeon: Dutch Gray, MD;  Location: [facility];  Service: Urology;  Laterality: Left;   Family History  Problem Relation Age of Onset  . Cancer Father     pancreatic  . Heart attack Brother    Social History  Substance Use Topics  . Smoking status: Never Smoker   . Smokeless tobacco: Never Used  . Alcohol Use: No    Review of Systems  Neurological: Positive for dizziness.     10 Systems reviewed and are negative for acute change except as noted in the HPI.   Allergies  Review of patient's allergies indicates no known allergies.  Home Medications   Prior to Admission medications   Medication Sig Start Date End Date Taking? Authorizing Provider  albuterol (PROVENTIL HFA;VENTOLIN HFA) 108 (90 BASE) MCG/ACT inhaler Inhale 2 puffs into the lungs every 4 (four) hours as needed for wheezing or shortness of breath (  for heat related breathing issues).   Yes Historical Provider, MD  amLODipine (NORVASC) 5 MG tablet Take 1 tablet (5 mg total) by mouth daily. 06/23/14  Yes Peter M Martinique, MD  aspirin EC 81 MG tablet Take 81 mg by mouth every morning.    Yes Historical Provider, MD  diphenhydrAMINE (BENADRYL) 25 mg capsule Take 50 mg by mouth at bedtime. For sleep   Yes Historical Provider, MD  losartan (COZAAR) 100 MG tablet Take 1 tablet (100 mg total) by mouth daily. 06/23/14  Yes Peter M Martinique, MD  metoprolol succinate (TOPROL-XL) 25 MG 24 hr tablet  Take 1 tablet (25 mg total) by mouth every evening. 06/23/14  Yes Peter M Martinique, MD  omeprazole (PRILOSEC) 20 MG capsule Take 20 mg by mouth every morning.    Yes Historical Provider, MD  potassium citrate (UROCIT-K) 10 MEQ (1080 MG) SR tablet Take 20 mEq by mouth 2 (two) times daily.   Yes Historical Provider, MD  pravastatin (PRAVACHOL) 20 MG tablet Take 20 mg by mouth at bedtime.    Yes Historical Provider, MD  tamsulosin (FLOMAX) 0.4 MG CAPS capsule Take 0.4 mg by mouth at bedtime as needed (for kidney stone related urinary retention).  04/04/14  Yes Historical Provider, MD   BP 136/77 mmHg  Pulse 60  Temp(Src) 98.1 F (36.7 C) (Oral)  Resp 18  SpO2 98% Physical Exam  Constitutional: He is oriented to person, place, and time. He appears well-developed and well-nourished. No distress.  Awake, alert  HENT:  Head: Normocephalic and atraumatic.  Eyes: Conjunctivae and EOM are normal. Pupils are equal, round, and reactive to light.  Neck: Neck supple.  Cardiovascular: Normal rate, regular rhythm and normal heart sounds.   No murmur heard. Pulmonary/Chest: Effort normal and breath sounds normal. No respiratory distress.  Abdominal: Soft. Bowel sounds are normal. He exhibits no distension.  Musculoskeletal: He exhibits no edema.  Neurological: He is alert and oriented to person, place, and time. He has normal reflexes. No cranial nerve deficit. He exhibits normal muscle tone.  Fluent speech, normal finger-to-nose testing, negative pronator drift  Skin: Skin is warm and dry.  Psychiatric: He has a normal mood and affect. Judgment and thought content normal.  Nursing note and vitals reviewed.   ED Course  Procedures (including critical care time) Labs Review Labs Reviewed  BASIC METABOLIC PANEL - Abnormal; Notable for the following:    BUN 25 (*)    Creatinine, Ser 1.59 (*)    GFR calc non Af Amer 43 (*)    GFR calc Af Amer 49 (*)    All other components within normal limits  CBC  WITH DIFFERENTIAL/PLATELET - Abnormal; Notable for the following:    Monocytes Relative 13 (*)    All other components within normal limits    Imaging Review Ct Angio Head W/cm &/or Wo Cm  10/31/2014   CLINICAL DATA:  Sudden onset blurred vision and dizziness this morning. Confusion.  EXAM: CT ANGIOGRAPHY HEAD AND NECK  TECHNIQUE: Multidetector CT imaging of the head and neck was performed using the standard protocol during bolus administration of intravenous contrast. Multiplanar CT image reconstructions and MIPs were obtained to evaluate the vascular anatomy. Carotid stenosis measurements (when applicable) are obtained utilizing NASCET criteria, using the distal internal carotid diameter as the denominator.  CONTRAST:  80mL OMNIPAQUE IOHEXOL 350 MG/ML SOLN  COMPARISON:  None.  FINDINGS: CT HEAD  Brain: There is no evidence of acute cortical infarct, intracranial hemorrhage, mass,  midline shift, or extra-axial fluid collection. The ventricles and sulci are normal.  Calvarium and skull base: No skull fracture or aggressive osseous lesions.  Paranasal sinuses: Small left maxillary sinus mucous retention cyst. Clear mastoid air cells.  Orbits: Unremarkable.  CTA NECK  Aortic arch: 3 vessel aortic arch. Brachiocephalic and subclavian arteries are widely patent.  Right carotid system: Patent without evidence of stenosis, dissection, or aneurysm. Mid cervical ICA is tortuous.  Left carotid system: Patent without stenosis. Minimal noncalcified plaque at the carotid bifurcation. There is a focal, anteriorly eccentric dilatation of the cervical ICA just below the skullbase which measures 8 mm in AP and transverse diameters and spans a length of 13 mm. At the anterior inferior aspect of this are 1 or 2 small calcifications as well as a possible tiny intimal flap.  Vertebral arteries: Vertebral arteries are patent with the left being mildly dominant. No vertebral artery stenosis is seen.  Skeleton: There is a 9 mm oval  lytic lesion in the posterior C3 vertebral body. Mild-to-moderate lower left cervical disc degeneration and moderate multilevel cervical facet arthrosis are present.  Other neck: 8 mm right thyroid nodule.  CTA HEAD  Anterior circulation: Internal carotid arteries are patent from skullbase to carotid termini. There is minimal carotid siphon calcification without stenosis. Anterior communicating artery is patent. ACAs and MCAs are unremarkable.  Posterior circulation: Intracranial vertebral arteries are patent with the left being dominant. Right vertebral artery is hypoplastic distal to the PICA origin. PICA and SCA origins are patent bilaterally. Basilar artery is patent without stenosis. There is a fetal type origin of the left PCA with a severely hypoplastic left P1 segment. PCAs are otherwise unremarkable.  Venous sinuses: Patent. Incidental arachnoid granulations in the distal transverse sinuses.  Anatomic variants: Fetal origin of the left PCA.  Delayed phase: No abnormal enhancement.  IMPRESSION: 1. Unremarkable head CTA. No evidence of acute intracranial abnormality, large vessel occlusion, or significant intracranial stenosis. 2. 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 3. No cervical carotid or vertebral artery stenosis. 4. 9 mm indeterminate lesion in the C3 vertebral body. Consider cervical spine MRI without and with IV contrast as an outpatient for further evaluation.   Electronically Signed   By: Logan Bores   On: 10/31/2014 15:17   Dg Chest 2 View  10/31/2014   CLINICAL DATA:  69 year old male with dizziness and lightheadedness for the past several hours.  EXAM: CHEST  2 VIEW  COMPARISON:  Chest x-ray 01/09/2014.  FINDINGS: Prominent bilateral nipple shadows, similar to the prior examination. Lung volumes are normal. No consolidative airspace disease. No pleural effusions. No pneumothorax. No pulmonary nodule or mass noted. Pulmonary vasculature and the cardiomediastinal silhouette are  within normal limits.  IMPRESSION: 1. No radiographic evidence of acute cardiopulmonary disease.   Electronically Signed   By: Vinnie Langton M.D.   On: 10/31/2014 13:28   Ct Angio Neck W/cm &/or Wo/cm  10/31/2014   CLINICAL DATA:  Sudden onset blurred vision and dizziness this morning. Confusion.  EXAM: CT ANGIOGRAPHY HEAD AND NECK  TECHNIQUE: Multidetector CT imaging of the head and neck was performed using the standard protocol during bolus administration of intravenous contrast. Multiplanar CT image reconstructions and MIPs were obtained to evaluate the vascular anatomy. Carotid stenosis measurements (when applicable) are obtained utilizing NASCET criteria, using the distal internal carotid diameter as the denominator.  CONTRAST:  73mL OMNIPAQUE IOHEXOL 350 MG/ML SOLN  COMPARISON:  None.  FINDINGS: CT HEAD  Brain: There is no evidence of acute cortical infarct, intracranial hemorrhage, mass, midline shift, or extra-axial fluid collection. The ventricles and sulci are normal.  Calvarium and skull base: No skull fracture or aggressive osseous lesions.  Paranasal sinuses: Small left maxillary sinus mucous retention cyst. Clear mastoid air cells.  Orbits: Unremarkable.  CTA NECK  Aortic arch: 3 vessel aortic arch. Brachiocephalic and subclavian arteries are widely patent.  Right carotid system: Patent without evidence of stenosis, dissection, or aneurysm. Mid cervical ICA is tortuous.  Left carotid system: Patent without stenosis. Minimal noncalcified plaque at the carotid bifurcation. There is a focal, anteriorly eccentric dilatation of the cervical ICA just below the skullbase which measures 8 mm in AP and transverse diameters and spans a length of 13 mm. At the anterior inferior aspect of this are 1 or 2 small calcifications as well as a possible tiny intimal flap.  Vertebral arteries: Vertebral arteries are patent with the left being mildly dominant. No vertebral artery stenosis is seen.  Skeleton: There is  a 9 mm oval lytic lesion in the posterior C3 vertebral body. Mild-to-moderate lower left cervical disc degeneration and moderate multilevel cervical facet arthrosis are present.  Other neck: 8 mm right thyroid nodule.  CTA HEAD  Anterior circulation: Internal carotid arteries are patent from skullbase to carotid termini. There is minimal carotid siphon calcification without stenosis. Anterior communicating artery is patent. ACAs and MCAs are unremarkable.  Posterior circulation: Intracranial vertebral arteries are patent with the left being dominant. Right vertebral artery is hypoplastic distal to the PICA origin. PICA and SCA origins are patent bilaterally. Basilar artery is patent without stenosis. There is a fetal type origin of the left PCA with a severely hypoplastic left P1 segment. PCAs are otherwise unremarkable.  Venous sinuses: Patent. Incidental arachnoid granulations in the distal transverse sinuses.  Anatomic variants: Fetal origin of the left PCA.  Delayed phase: No abnormal enhancement.  IMPRESSION: 1. Unremarkable head CTA. No evidence of acute intracranial abnormality, large vessel occlusion, or significant intracranial stenosis. 2. 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 3. No cervical carotid or vertebral artery stenosis. 4. 9 mm indeterminate lesion in the C3 vertebral body. Consider cervical spine MRI without and with IV contrast as an outpatient for further evaluation.   Electronically Signed   By: Logan Bores   On: 10/31/2014 15:17   I, Wenda Overland Tynleigh Birt, personally reviewed and evaluated these images and lab results as part of my medical decision-making.   EKG Interpretation   Date/Time:  Sunday October 31 2014 10:46:37 EDT Ventricular Rate:  75 PR Interval:  204 QRS Duration: 170 QT Interval:  452 QTC Calculation: 505 R Axis:   -33 Text Interpretation:  Sinus rhythm Probable left atrial enlargement Left  bundle branch block No significant change was found  Confirmed by Petros Ahart  MD, Marian Grandt ([id number]) on 10/31/2014 11:32:02 AM      MDM   Final diagnoses:  Dizziness   69 year old male who presents with an episode of blurry vision associated with dizziness and feeling off balance that began this morning. At presentation, the patient was awake, alert, and in no acute distress. Vital signs unremarkable. Physical exam including complete neurologic exam was unremarkable. Obtained above lab work as well as EKG and chest x-ray. EKG unchanged from previous and lab work shows patient's known CK D with creatinine of 1.6. Because of the patient's sudden onset of symptoms and advanced age, I am concerned about intracranial process such as vertebral  artery dissection. Obtained a CTA of the head and neck.  CTA did not show any acute findings to explain the patient's symptoms. The patient is currently on aspirin which reduces his risk of TIA. On reexamination, the patient reports feeling much improved and states that his symptoms are resolved. The patient has never had any neurologic deficits on exam, thus I feel that he does not need any further imaging at this time. I have instructed the patient to follow-up with his PCP if his symptoms return or to seek immediate medical attention for any new neurologic symptoms. I have also instructed him to follow-up regarding his CK D. Patient and his wife voiced understanding of return precautions and discharged in satisfactory condition.   Sharlett Iles, MD 10/31/14 [phone]

## 2014-11-10 ENCOUNTER — Telehealth: Payer: Self-pay | Admitting: Cardiology

## 2014-11-10 NOTE — Telephone Encounter (Signed)
Closed encounter °

## 2014-12-02 ENCOUNTER — Ambulatory Visit (INDEPENDENT_AMBULATORY_CARE_PROVIDER_SITE_OTHER)
Admission: RE | Admit: 2014-12-02 | Discharge: 2014-12-02 | Disposition: A | Discharge status: Home / Self Care | Payer: Medicare Other | Source: Ambulatory Visit | Attending: Pulmonary Disease | Admitting: Pulmonary Disease

## 2014-12-02 DIAGNOSIS — I712 Thoracic aortic aneurysm, without rupture: Secondary | ICD-10-CM | POA: Diagnosis not present

## 2014-12-02 DIAGNOSIS — R0602 Shortness of breath: Secondary | ICD-10-CM | POA: Diagnosis not present

## 2014-12-08 ENCOUNTER — Telehealth: Payer: Self-pay | Admitting: Pulmonary Disease

## 2014-12-08 NOTE — Telephone Encounter (Signed)
Per CT result note: Please let the patient know this was OK ---  I spoke with patient about results and he verbalized understanding and had no questions

## 2014-12-17 DIAGNOSIS — D2239 Melanocytic nevi of other parts of face: Secondary | ICD-10-CM | POA: Diagnosis not present

## 2014-12-17 DIAGNOSIS — L570 Actinic keratosis: Secondary | ICD-10-CM | POA: Diagnosis not present

## 2014-12-17 DIAGNOSIS — L814 Other melanin hyperpigmentation: Secondary | ICD-10-CM | POA: Diagnosis not present

## 2014-12-17 DIAGNOSIS — D225 Melanocytic nevi of trunk: Secondary | ICD-10-CM | POA: Diagnosis not present

## 2014-12-17 DIAGNOSIS — D1722 Benign lipomatous neoplasm of skin and subcutaneous tissue of left arm: Secondary | ICD-10-CM | POA: Diagnosis not present

## 2014-12-17 DIAGNOSIS — D2372 Other benign neoplasm of skin of left lower limb, including hip: Secondary | ICD-10-CM | POA: Diagnosis not present

## 2014-12-17 DIAGNOSIS — L821 Other seborrheic keratosis: Secondary | ICD-10-CM | POA: Diagnosis not present

## 2014-12-17 DIAGNOSIS — D2262 Melanocytic nevi of left upper limb, including shoulder: Secondary | ICD-10-CM | POA: Diagnosis not present

## 2014-12-17 DIAGNOSIS — Z85820 Personal history of malignant melanoma of skin: Secondary | ICD-10-CM | POA: Diagnosis not present

## 2014-12-21 ENCOUNTER — Telehealth: Payer: Self-pay | Admitting: Pulmonary Disease

## 2014-12-21 NOTE — Telephone Encounter (Signed)
lmtcb x1 for pt. 

## 2014-12-22 NOTE — Telephone Encounter (Signed)
Called pt. He was calling about a recall for an appt. appt scheduled. Nothing further needed

## 2014-12-29 ENCOUNTER — Ambulatory Visit (INDEPENDENT_AMBULATORY_CARE_PROVIDER_SITE_OTHER): Payer: Medicare Other | Admitting: Cardiology

## 2014-12-29 ENCOUNTER — Encounter: Payer: Self-pay | Admitting: Cardiology

## 2014-12-29 VITALS — BP 108/80 | HR 94 | Ht 71.5 in | Wt 208.4 lb

## 2014-12-29 DIAGNOSIS — R0602 Shortness of breath: Secondary | ICD-10-CM | POA: Diagnosis not present

## 2014-12-29 DIAGNOSIS — I10 Essential (primary) hypertension: Secondary | ICD-10-CM

## 2014-12-29 DIAGNOSIS — I447 Left bundle-branch block, unspecified: Secondary | ICD-10-CM

## 2014-12-29 NOTE — Patient Instructions (Signed)
Let's stop metoprolol and see if this helps  Continue your other therapy  I will see you in 6 months.

## 2014-12-29 NOTE — Progress Notes (Signed)
[name] Date of Birth: 1946-01-29 Medical Record #[id number]  History of Present Illness: Corey [name] is seen for follow up of dyspnea. This is now a chronic complaint.   He has a history of HTN and thoracic aortic aneurysm. He has  chronic kidney disease.   He has a history of minimal coronary artery disease per prior cath in 2011. Prior PFTs are normal.  He notes he may get SOB walking [place] but then can walk on a treadmill without difficulty. His last CT in September 2016 showed stable thoracic aneurysm at 4.5 cm. No significant pulmonary findings. Prior BNP was normal. No improvement with diuretics- this just made him feel washed out.  Prior Echo reviewed showed moderate concentric LVH with normal systolic function. Abnormal septal motion c/w LBBB. He states he does much better in cooler weather.   Current Outpatient Prescriptions on File Prior to Visit  Medication Sig Dispense Refill  . albuterol (PROVENTIL HFA;VENTOLIN HFA) 108 (90 BASE) MCG/ACT inhaler Inhale 2 puffs into the lungs every 4 (four) hours as needed for wheezing or shortness of breath (for heat related breathing issues).    Marland Kitchen amLODipine (NORVASC) 5 MG tablet Take 1 tablet (5 mg total) by mouth daily. 30 tablet 6  . aspirin EC 81 MG tablet Take 81 mg by mouth every morning.     . diphenhydrAMINE (BENADRYL) 25 mg capsule Take 50 mg by mouth at bedtime. For sleep    . losartan (COZAAR) 100 MG tablet Take 1 tablet (100 mg total) by mouth daily. 30 tablet 6  . metoprolol succinate (TOPROL-XL) 25 MG 24 hr tablet Take 1 tablet (25 mg total) by mouth every evening. 30 tablet 6  . omeprazole (PRILOSEC) 20 MG capsule Take 20 mg by mouth every morning.     . potassium citrate (UROCIT-K) 10 MEQ (1080 MG) SR tablet Take 20 mEq by mouth 2 (two) times daily.    . pravastatin (PRAVACHOL) 20 MG tablet Take 20 mg by mouth at bedtime.     . tamsulosin (FLOMAX) 0.4 MG CAPS capsule Take 0.4 mg by mouth at bedtime as needed (for kidney  stone related urinary retention).   0   No current facility-administered medications on file prior to visit.    No Known Allergies  Past Medical History  Diagnosis Date  . Hypertension   . Coronary artery disease     minimal per cath in 2011  . Nephrolithiasis   . Lactose intolerance   . Ascending aortic aneurysm [facility])     Last CT in July 2012  . Left bundle branch block   . Heart murmur   . Arthritis 10-22-11    hx. osteoarthritis-knees  . GERD (gastroesophageal reflux disease)   . Cancer ([place])     right kidney  . Dyspnea     Past Surgical History  Procedure Laterality Date  . Knee surgery      arthroscopic  . Total knee arthroplasty      bilateral  . Elbow surgery  10-22-11    right elbow  . Lumbar spine surgery  10-22-11    '03- Lumbar fusion -retained hardware([name])  . Appendectomy    . Cardiac catheterization  12/22/09    EF60%/minimal nonobstructive atherosclerotic coronary artery disease/normal lt ventricular function/mod aortic root dilatation  . Cystoscopy/retrograde/ureteroscopy/stone extraction with basket  10-22-11    1'12  . Partial nephrectomy Right 8/13  . Cystoscopy with retrograde pyelogram, ureteroscopy and stent placement Left 07/28/2012  Procedure: CYSTOSCOPY WITH RETROGRADE PYELOGRAM, LEFT URETEROSCOPY ;  Surgeon: Dutch Gray, MD;  Location: [facility];  Service: Urology;  Laterality: Left;    History  Smoking status  . Never Smoker   Smokeless tobacco  . Never Used    History  Alcohol Use No    Family History  Problem Relation Age of Onset  . Cancer Father     pancreatic  . Heart attack Brother     Review of Systems: As noted in HPI. All other systems were reviewed and are negative.  Physical Exam: BP 108/80 mmHg  Pulse 94  Ht 5' 11.5" (1.816 m)  Wt 94.53 kg (208 lb 6.4 oz)  BMI 28.66 kg/m2  SpO2 93% Patient is  pleasant and in no acute distress.  Skin is warm and dry. Color is normal.  HEENT is unremarkable.  Normocephalic/atraumatic. PERRL. Sclera are nonicteric. Neck is supple. No masses. No JVD or bruits. Lungs are clear. Cardiac exam shows a regular rate and rhythm. Normal S1-2. No murmur or gallop. Abdomen is soft. Extremities are without edema. Gait and ROM are intact. No gross neurologic deficits noted.   LABORATORY DATA:  Lab Results  Component Value Date   WBC 4.5 10/31/2014   HGB 16.3 10/31/2014   HCT 46.2 10/31/2014   PLT 173 10/31/2014   GLUCOSE 99 10/31/2014   ALT 29 08/27/2006   AST 23 08/27/2006   NA 138 10/31/2014   K 4.8 10/31/2014   CL 106 10/31/2014   CREATININE 1.59* 10/31/2014   BUN 25* 10/31/2014   CO2 27 10/31/2014   INR 1.4 09/04/2006     Echo: 08/03/13:Study Conclusions  - Left ventricle: Abnormal septal motion. The cavity size was normal. Wall thickness was increased in a pattern of moderate LVH. Systolic function was normal. The estimated ejection fraction was in the range of 50% to 55%. Doppler parameters are consistent with abnormal left ventricular relaxation (grade 1 diastolic dysfunction). - Atrial septum: No defect or patent foramen ovale was identified.   CT CHEST WITHOUT CONTRAST  TECHNIQUE: Multidetector CT imaging of the chest was performed following the standard protocol without intravenous contrast. High resolution imaging of the lungs, as well as inspiratory and expiratory imaging, was performed.  COMPARISON: Chest CT 12/27/2013.  FINDINGS: Mediastinum/Lymph Nodes: Heart size is normal. There is no significant pericardial fluid, thickening or pericardial calcification. Aneurysmal dilatation of the ascending thoracic aorta which measures up to 4.5 cm in diameter (unchanged compared to 12/27/2013). No pathologically enlarged mediastinal or hilar lymph nodes. Please note that accurate exclusion of hilar adenopathy is limited on noncontrast CT scans. Esophagus is unremarkable in appearance. No axillary  lymphadenopathy.  Lungs/Pleura: High-resolution images demonstrate no areas of significant ground-glass attenuation, subpleural reticulation, parenchymal banding, traction bronchiectasis or frank honeycombing. Inspiratory and expiratory images demonstrate minimal air trapping in the lung bases, indicative of very mild small airways disease. Few scattered tiny new 2-4 mm pulmonary nodules are noted in the lungs bilaterally, largest of which measures 4 mm in the lateral segment of the right middle lobe (image 38 of series 6), unchanged compared to prior study 11/29/2010. No larger more suspicious appearing pulmonary nodules or masses are otherwise noted. No acute consolidative airspace disease. No pleural effusions.  Upper Abdomen: Unremarkable.  Musculoskeletal/Soft Tissues: There are no aggressive appearing lytic or blastic lesions noted in the visualized portions of the skeleton.  IMPRESSION: 1. No findings to suggest interstitial lung disease. 2. Minimal air trapping in the lung bases bilaterally, indicative of  very mild small airways disease. 3. Unchanged 4.5 cm ascending thoracic aortic aneurysm. Ascending thoracic aortic aneurysm. Recommend semi-annual imaging followup by CTA or MRA and referral to cardiothoracic surgery if not already obtained. This recommendation follows 2010 ACCF/AHA/AATS/ACR/ASA/SCA/SCAI/SIR/STS/SVM Guidelines for the Diagnosis and Management of Patients With Thoracic Aortic Disease. Circulation. 2010; 121: H476-L465. 4. Stable tiny pulmonary nodules dating back to 11/29/2010, considered benign, as above.   Electronically Signed  By: Vinnie Langton M.D.  On: 12/06/2014 08:57    Assessment / Plan: 1. HTN- BP under good control.   2. Thoracic aortic aneurysm. Unchanged at 4.5 cm. Repeat at one year.   3. CAD- minimal by prior cath.  4. LBBB chronic.  5. Dyspnea. No clear etiology based on prior cardiac and pulmonary work up.  Symptoms clinically improved with cooler weather. Echo is not consistent with HOCM (question raised by pulmonary)- he has concentric LVH without SAM or ASH. At this point will try stopping metoprolol to see if this helps. Continue amlodipine and ARB for BP.  I will follow up in 6 months.

## 2015-01-06 ENCOUNTER — Ambulatory Visit (INDEPENDENT_AMBULATORY_CARE_PROVIDER_SITE_OTHER): Payer: Medicare Other | Admitting: Pulmonary Disease

## 2015-01-06 ENCOUNTER — Encounter: Payer: Self-pay | Admitting: Pulmonary Disease

## 2015-01-06 VITALS — BP 126/72 | HR 103 | Ht 70.0 in | Wt 211.0 lb

## 2015-01-06 DIAGNOSIS — R0602 Shortness of breath: Secondary | ICD-10-CM

## 2015-01-06 NOTE — Patient Instructions (Signed)
Come back and see [place] if you develop a new respiratory problem or if things worsen

## 2015-01-06 NOTE — Assessment & Plan Note (Signed)
Corey [name] has been experiencing some dyspnea with exertion only in the heat or if he is doing something intense like climbing stairs. This CT scan shows no evidence of lung disease of any kind. Further, his lung exam is normal, his oxygen saturation is normal and his lung function testing a year ago was normal. I can find no evidence of lung disease.   Further, he is very active and quite healthy.  Plan: From my standpoint there is no pulmonary cause for dyspnea Continue follow-up with cardiology Remain active with exercise We talked today at length about ways he could try to use cool air and avoid going out in the heat to avoid dyspnea

## 2015-01-06 NOTE — Progress Notes (Signed)
Subjective:    Patient ID: Corey [name], male    DOB: Oct 25, 1945, 69 y.o.   MRN: [id number]  Synopsis: Referred to [facility] in 2015 for evaluation of interstitial lung disease.  He had no history of connective tissue disease but a 06/2013 CT angiogram chest showed a small amount of dependent ground glass in the bases of his lungs.  A serologic panel was completely normal (no suggestion of a connective tissue disease) and his PFTs were completely normal as well.   2016 CT chest > no ILD, aneurysm unchanged   HPI  Chief Complaint  Patient presents with  . Follow-up    last seen 11/2013.  pt c/o SOB.  review HRCT from 12/02/14.   Corey [name] is still having trouble breathing from time to time, but he says it is directly related to the heat.  He played golf one week ago in the cool weather and felt great, but yesterday he had trouble breathing when it was cool. Otherwise he is doing well.  He says that he thinks his medications may be causing this.  He says that he remains active and doesn't have dyspnea only really occurs with hot weather and with climbing stairs.    Past Medical History  Diagnosis Date  . Hypertension   . Coronary artery disease     minimal per cath in 2011  . Nephrolithiasis   . Lactose intolerance   . Ascending aortic aneurysm [facility])     Last CT in July 2012  . Left bundle branch block   . Heart murmur   . Arthritis 10-22-11    hx. osteoarthritis-knees  . GERD (gastroesophageal reflux disease)   . Cancer ([place])     right kidney  . Dyspnea      Review of Systems     Objective:   Physical Exam Filed Vitals:   01/06/15 1426  BP: 126/72  Pulse: 103  Height: 5\' 10"  (1.778 m)  Weight: 211 lb (95.709 kg)  SpO2: 95%     Gen: well appearing, no acute distress HEENT: NCAT, EOMi, OP clear,  PULM: CTA B CV: RRR, no mgr, no JVD AB: BS+, soft, nontender,  Ext: warm, no edema, no clubbing, no cyanosis Derm: no rash or skin breakdown Neuro: A&Ox4, CN II-XII  intact, MAEW    07/13/2013 CT angiogram chest > thoracic aorta 4.5 cm, unchanged, no pulmonary embolism, no emphysema, there is very mild groundglass opacification in a dependent distribution bilaterally no pleural disease, normal cardiac silhouette no mediastinal lymphadenopathy   May 2015 echocardiogram> abnormal septal motion noted, calcified aortic valve without stenosis, moderate LVH, LVEF 50-55%, RV and are normal  11/2013 ANA, CRP, Aldolase, RF, SSA/SSB, CCP, Anti-Jo-1, SCL-70 all normal  12/14/2013 pulmonary function testing> ratio 76%, FEV1 3.51 L (105% predicted, no change with bronchodilator), total lung capacity 7.80 L (111% predicted), DLCO 30.45 (94% predicted).        Assessment & Plan:   DYSPNEA Corey [name] has been experiencing some dyspnea with exertion only in the heat or if he is doing something intense like climbing stairs. This CT scan shows no evidence of lung disease of any kind. Further, his lung exam is normal, his oxygen saturation is normal and his lung function testing a year ago was normal. I can find no evidence of lung disease.   Further, he is very active and quite healthy.  Plan: From my standpoint there is no pulmonary cause for dyspnea Continue follow-up with cardiology Remain active with  exercise We talked today at length about ways he could try to use cool air and avoid going out in the heat to avoid dyspnea    Updated Medication List Outpatient Encounter Prescriptions as of 01/06/2015  Medication Sig  . albuterol (PROVENTIL HFA;VENTOLIN HFA) 108 (90 BASE) MCG/ACT inhaler Inhale 2 puffs into the lungs every 4 (four) hours as needed for wheezing or shortness of breath (for heat related breathing issues).  Marland Kitchen amLODipine (NORVASC) 5 MG tablet Take 1 tablet (5 mg total) by mouth daily.  Marland Kitchen aspirin EC 81 MG tablet Take 81 mg by mouth every morning.   . diphenhydrAMINE (BENADRYL) 25 mg capsule Take 50 mg by mouth at bedtime. For sleep  . furosemide (LASIX)  20 MG tablet Take 20 mg by mouth daily.  Marland Kitchen losartan (COZAAR) 100 MG tablet Take 1 tablet (100 mg total) by mouth daily.  Marland Kitchen omeprazole (PRILOSEC) 20 MG capsule Take 20 mg by mouth every morning.   . potassium citrate (UROCIT-K) 10 MEQ (1080 MG) SR tablet Take 20 mEq by mouth 2 (two) times daily.  . pravastatin (PRAVACHOL) 20 MG tablet Take 20 mg by mouth at bedtime.   . tamsulosin (FLOMAX) 0.4 MG CAPS capsule Take 0.4 mg by mouth at bedtime as needed (for kidney stone related urinary retention).   . [DISCONTINUED] metoprolol succinate (TOPROL-XL) 25 MG 24 hr tablet Take 1 tablet (25 mg total) by mouth every evening.   No facility-administered encounter medications on file as of 01/06/2015.

## 2015-01-12 DIAGNOSIS — Z125 Encounter for screening for malignant neoplasm of prostate: Secondary | ICD-10-CM | POA: Diagnosis not present

## 2015-01-12 DIAGNOSIS — R739 Hyperglycemia, unspecified: Secondary | ICD-10-CM | POA: Diagnosis not present

## 2015-01-12 DIAGNOSIS — E785 Hyperlipidemia, unspecified: Secondary | ICD-10-CM | POA: Diagnosis not present

## 2015-01-12 DIAGNOSIS — I10 Essential (primary) hypertension: Secondary | ICD-10-CM | POA: Diagnosis not present

## 2015-01-17 DIAGNOSIS — Z6828 Body mass index (BMI) 28.0-28.9, adult: Secondary | ICD-10-CM | POA: Diagnosis not present

## 2015-01-17 DIAGNOSIS — E784 Other hyperlipidemia: Secondary | ICD-10-CM | POA: Diagnosis not present

## 2015-01-17 DIAGNOSIS — I10 Essential (primary) hypertension: Secondary | ICD-10-CM | POA: Diagnosis not present

## 2015-01-17 DIAGNOSIS — R0602 Shortness of breath: Secondary | ICD-10-CM | POA: Diagnosis not present

## 2015-01-17 DIAGNOSIS — R739 Hyperglycemia, unspecified: Secondary | ICD-10-CM | POA: Diagnosis not present

## 2015-01-17 DIAGNOSIS — Z0000 Encounter for general adult medical examination without abnormal findings: Secondary | ICD-10-CM | POA: Diagnosis not present

## 2015-01-17 DIAGNOSIS — R3911 Hesitancy of micturition: Secondary | ICD-10-CM | POA: Diagnosis not present

## 2015-01-17 DIAGNOSIS — N183 Chronic kidney disease, stage 3 (moderate): Secondary | ICD-10-CM | POA: Diagnosis not present

## 2015-01-17 DIAGNOSIS — I712 Thoracic aortic aneurysm, without rupture: Secondary | ICD-10-CM | POA: Diagnosis not present

## 2015-01-17 DIAGNOSIS — Z23 Encounter for immunization: Secondary | ICD-10-CM | POA: Diagnosis not present

## 2015-01-17 DIAGNOSIS — Z1389 Encounter for screening for other disorder: Secondary | ICD-10-CM | POA: Diagnosis not present

## 2015-01-17 DIAGNOSIS — N200 Calculus of kidney: Secondary | ICD-10-CM | POA: Diagnosis not present

## 2015-01-18 DIAGNOSIS — Z1212 Encounter for screening for malignant neoplasm of rectum: Secondary | ICD-10-CM | POA: Diagnosis not present

## 2015-01-25 ENCOUNTER — Ambulatory Visit (HOSPITAL_COMMUNITY)
Admission: RE | Admit: 2015-01-25 | Discharge: 2015-01-25 | Disposition: A | Discharge status: Home / Self Care | Payer: Medicare Other | Source: Ambulatory Visit | Attending: Urology | Admitting: Urology

## 2015-01-25 ENCOUNTER — Other Ambulatory Visit (HOSPITAL_COMMUNITY): Payer: Self-pay | Admitting: Urology

## 2015-01-25 DIAGNOSIS — C649 Malignant neoplasm of unspecified kidney, except renal pelvis: Secondary | ICD-10-CM | POA: Diagnosis not present

## 2015-01-25 DIAGNOSIS — Z85528 Personal history of other malignant neoplasm of kidney: Secondary | ICD-10-CM | POA: Diagnosis not present

## 2015-01-25 DIAGNOSIS — I10 Essential (primary) hypertension: Secondary | ICD-10-CM | POA: Diagnosis not present

## 2015-01-25 DIAGNOSIS — N200 Calculus of kidney: Secondary | ICD-10-CM | POA: Diagnosis not present

## 2015-01-25 DIAGNOSIS — C641 Malignant neoplasm of right kidney, except renal pelvis: Secondary | ICD-10-CM | POA: Diagnosis not present

## 2015-02-04 DIAGNOSIS — Z23 Encounter for immunization: Secondary | ICD-10-CM | POA: Diagnosis not present

## 2015-02-16 ENCOUNTER — Other Ambulatory Visit: Payer: Self-pay

## 2015-02-16 MED ORDER — AMLODIPINE BESYLATE 5 MG PO TABS: 5.0000 mg | ORAL_TABLET | Freq: Every day | ORAL | Status: DC

## 2015-02-16 MED ORDER — LOSARTAN POTASSIUM 100 MG PO TABS: 100.0000 mg | ORAL_TABLET | Freq: Every day | ORAL | Status: DC

## 2015-02-18 DIAGNOSIS — R3916 Straining to void: Secondary | ICD-10-CM | POA: Diagnosis not present

## 2015-02-18 DIAGNOSIS — N401 Enlarged prostate with lower urinary tract symptoms: Secondary | ICD-10-CM | POA: Diagnosis not present

## 2015-02-18 DIAGNOSIS — N200 Calculus of kidney: Secondary | ICD-10-CM | POA: Diagnosis not present

## 2015-02-18 DIAGNOSIS — C641 Malignant neoplasm of right kidney, except renal pelvis: Secondary | ICD-10-CM | POA: Diagnosis not present

## 2015-02-21 ENCOUNTER — Ambulatory Visit: Payer: Medicare Other | Admitting: Cardiology

## 2015-05-20 ENCOUNTER — Other Ambulatory Visit: Payer: Self-pay | Admitting: Cardiology

## 2015-06-24 DIAGNOSIS — Z85820 Personal history of malignant melanoma of skin: Secondary | ICD-10-CM | POA: Diagnosis not present

## 2015-06-24 DIAGNOSIS — L821 Other seborrheic keratosis: Secondary | ICD-10-CM | POA: Diagnosis not present

## 2015-06-24 DIAGNOSIS — D2261 Melanocytic nevi of right upper limb, including shoulder: Secondary | ICD-10-CM | POA: Diagnosis not present

## 2015-06-24 DIAGNOSIS — D2262 Melanocytic nevi of left upper limb, including shoulder: Secondary | ICD-10-CM | POA: Diagnosis not present

## 2015-06-24 DIAGNOSIS — L570 Actinic keratosis: Secondary | ICD-10-CM | POA: Diagnosis not present

## 2015-06-24 DIAGNOSIS — L814 Other melanin hyperpigmentation: Secondary | ICD-10-CM | POA: Diagnosis not present

## 2015-06-24 DIAGNOSIS — D225 Melanocytic nevi of trunk: Secondary | ICD-10-CM | POA: Diagnosis not present

## 2015-07-14 ENCOUNTER — Ambulatory Visit: Payer: Medicare Other | Admitting: Cardiology

## 2015-07-18 DIAGNOSIS — Z1211 Encounter for screening for malignant neoplasm of colon: Secondary | ICD-10-CM | POA: Diagnosis not present

## 2015-07-18 DIAGNOSIS — K649 Unspecified hemorrhoids: Secondary | ICD-10-CM | POA: Diagnosis not present

## 2015-07-25 DIAGNOSIS — Z6827 Body mass index (BMI) 27.0-27.9, adult: Secondary | ICD-10-CM | POA: Diagnosis not present

## 2015-07-25 DIAGNOSIS — I10 Essential (primary) hypertension: Secondary | ICD-10-CM | POA: Diagnosis not present

## 2015-07-25 DIAGNOSIS — M79651 Pain in right thigh: Secondary | ICD-10-CM | POA: Diagnosis not present

## 2015-07-25 DIAGNOSIS — R7309 Other abnormal glucose: Secondary | ICD-10-CM | POA: Diagnosis not present

## 2015-08-03 ENCOUNTER — Other Ambulatory Visit: Payer: Self-pay | Admitting: Internal Medicine

## 2015-08-03 ENCOUNTER — Ambulatory Visit
Admission: RE | Admit: 2015-08-03 | Discharge: 2015-08-03 | Disposition: A | Discharge status: Home / Self Care | Payer: Medicare Other | Source: Ambulatory Visit | Attending: Internal Medicine | Admitting: Internal Medicine

## 2015-08-03 DIAGNOSIS — M4326 Fusion of spine, lumbar region: Secondary | ICD-10-CM | POA: Diagnosis not present

## 2015-08-03 DIAGNOSIS — M79604 Pain in right leg: Secondary | ICD-10-CM

## 2015-08-03 DIAGNOSIS — M1611 Unilateral primary osteoarthritis, right hip: Secondary | ICD-10-CM | POA: Diagnosis not present

## 2015-08-22 DIAGNOSIS — M1611 Unilateral primary osteoarthritis, right hip: Secondary | ICD-10-CM | POA: Diagnosis not present

## 2015-08-29 ENCOUNTER — Other Ambulatory Visit: Payer: Self-pay | Admitting: Gastroenterology

## 2015-08-29 DIAGNOSIS — D127 Benign neoplasm of rectosigmoid junction: Secondary | ICD-10-CM | POA: Diagnosis not present

## 2015-08-29 DIAGNOSIS — D126 Benign neoplasm of colon, unspecified: Secondary | ICD-10-CM | POA: Diagnosis not present

## 2015-08-29 DIAGNOSIS — K621 Rectal polyp: Secondary | ICD-10-CM | POA: Diagnosis not present

## 2015-08-29 DIAGNOSIS — Z1211 Encounter for screening for malignant neoplasm of colon: Secondary | ICD-10-CM | POA: Diagnosis not present

## 2015-11-27 ENCOUNTER — Other Ambulatory Visit: Payer: Self-pay | Admitting: Cardiology

## 2015-11-28 NOTE — Telephone Encounter (Signed)
Rx(s) sent to pharmacy electronically.  

## 2015-12-28 DIAGNOSIS — D2261 Melanocytic nevi of right upper limb, including shoulder: Secondary | ICD-10-CM | POA: Diagnosis not present

## 2015-12-28 DIAGNOSIS — D1722 Benign lipomatous neoplasm of skin and subcutaneous tissue of left arm: Secondary | ICD-10-CM | POA: Diagnosis not present

## 2015-12-28 DIAGNOSIS — D1801 Hemangioma of skin and subcutaneous tissue: Secondary | ICD-10-CM | POA: Diagnosis not present

## 2015-12-28 DIAGNOSIS — L814 Other melanin hyperpigmentation: Secondary | ICD-10-CM | POA: Diagnosis not present

## 2015-12-28 DIAGNOSIS — Z85820 Personal history of malignant melanoma of skin: Secondary | ICD-10-CM | POA: Diagnosis not present

## 2015-12-28 DIAGNOSIS — D2239 Melanocytic nevi of other parts of face: Secondary | ICD-10-CM | POA: Diagnosis not present

## 2015-12-28 DIAGNOSIS — L821 Other seborrheic keratosis: Secondary | ICD-10-CM | POA: Diagnosis not present

## 2015-12-28 DIAGNOSIS — D225 Melanocytic nevi of trunk: Secondary | ICD-10-CM | POA: Diagnosis not present

## 2015-12-31 DIAGNOSIS — Z23 Encounter for immunization: Secondary | ICD-10-CM | POA: Diagnosis not present

## 2016-01-02 ENCOUNTER — Other Ambulatory Visit: Payer: Self-pay | Admitting: Cardiology

## 2016-01-17 DIAGNOSIS — E784 Other hyperlipidemia: Secondary | ICD-10-CM | POA: Diagnosis not present

## 2016-01-17 DIAGNOSIS — R739 Hyperglycemia, unspecified: Secondary | ICD-10-CM | POA: Diagnosis not present

## 2016-01-17 DIAGNOSIS — I10 Essential (primary) hypertension: Secondary | ICD-10-CM | POA: Diagnosis not present

## 2016-01-17 DIAGNOSIS — Z125 Encounter for screening for malignant neoplasm of prostate: Secondary | ICD-10-CM | POA: Diagnosis not present

## 2016-01-24 DIAGNOSIS — I712 Thoracic aortic aneurysm, without rupture: Secondary | ICD-10-CM | POA: Diagnosis not present

## 2016-01-24 DIAGNOSIS — Z0000 Encounter for general adult medical examination without abnormal findings: Secondary | ICD-10-CM | POA: Diagnosis not present

## 2016-01-24 DIAGNOSIS — N183 Chronic kidney disease, stage 3 (moderate): Secondary | ICD-10-CM | POA: Diagnosis not present

## 2016-01-24 DIAGNOSIS — N200 Calculus of kidney: Secondary | ICD-10-CM | POA: Diagnosis not present

## 2016-01-24 DIAGNOSIS — E784 Other hyperlipidemia: Secondary | ICD-10-CM | POA: Diagnosis not present

## 2016-01-24 DIAGNOSIS — Z6828 Body mass index (BMI) 28.0-28.9, adult: Secondary | ICD-10-CM | POA: Diagnosis not present

## 2016-01-24 DIAGNOSIS — C649 Malignant neoplasm of unspecified kidney, except renal pelvis: Secondary | ICD-10-CM | POA: Diagnosis not present

## 2016-01-24 DIAGNOSIS — I10 Essential (primary) hypertension: Secondary | ICD-10-CM | POA: Diagnosis not present

## 2016-01-24 DIAGNOSIS — Z1389 Encounter for screening for other disorder: Secondary | ICD-10-CM | POA: Diagnosis not present

## 2016-01-24 DIAGNOSIS — R7309 Other abnormal glucose: Secondary | ICD-10-CM | POA: Diagnosis not present

## 2016-02-02 ENCOUNTER — Ambulatory Visit (HOSPITAL_COMMUNITY)
Admission: RE | Admit: 2016-02-02 | Discharge: 2016-02-02 | Disposition: A | Discharge status: Home / Self Care | Payer: Medicare Other | Source: Ambulatory Visit | Attending: Urology | Admitting: Urology

## 2016-02-02 ENCOUNTER — Other Ambulatory Visit (HOSPITAL_COMMUNITY): Payer: Self-pay | Admitting: Urology

## 2016-02-02 DIAGNOSIS — C641 Malignant neoplasm of right kidney, except renal pelvis: Secondary | ICD-10-CM | POA: Diagnosis not present

## 2016-02-02 DIAGNOSIS — C649 Malignant neoplasm of unspecified kidney, except renal pelvis: Secondary | ICD-10-CM | POA: Diagnosis not present

## 2016-02-08 DIAGNOSIS — N401 Enlarged prostate with lower urinary tract symptoms: Secondary | ICD-10-CM | POA: Diagnosis not present

## 2016-02-08 DIAGNOSIS — C641 Malignant neoplasm of right kidney, except renal pelvis: Secondary | ICD-10-CM | POA: Diagnosis not present

## 2016-02-08 DIAGNOSIS — R3912 Poor urinary stream: Secondary | ICD-10-CM | POA: Diagnosis not present

## 2016-02-08 DIAGNOSIS — N200 Calculus of kidney: Secondary | ICD-10-CM | POA: Diagnosis not present

## 2016-03-12 NOTE — Progress Notes (Signed)
Corey [name] Date of Birth: September 05, 1945 Medical Record [id number]  History of Present Illness: Corey [name] is seen for follow up of dyspnea. This is a chronic complaint.   He has a history of HTN and thoracic aortic aneurysm. He has  chronic kidney disease stage 3.   He has a history of minimal coronary artery disease per prior cath in 2011. Prior PFTs are normal.   His last CT in September 2016 showed stable thoracic aneurysm at 4.5 cm. No significant pulmonary findings. Prior BNP was normal. No improvement with diuretics- this just made him feel washed out.  Prior Echo reviewed showed moderate concentric LVH with normal systolic function. Abnormal septal motion c/w LBBB. He states he does much better in cooler weather. On his last visit we stopped his metoprolol.  He reports his breathing is doing better now that the weather is colder. Usually does worse in the summer. He otherwise feels well. [facility]. He takes Rapaflo once or twice a week for obstructive symptoms. Notes some ED. He has lost 6 lbs. Notes he occasionally gets really stressed- thinks it is work related but still enjoys his work.  Current Outpatient Prescriptions on File Prior to Visit  Medication Sig Dispense Refill  . albuterol (PROVENTIL HFA;VENTOLIN HFA) 108 (90 BASE) MCG/ACT inhaler Inhale 2 puffs into the lungs every 4 (four) hours as needed for wheezing or shortness of breath (for heat related breathing issues).    Marland Kitchen amLODipine (NORVASC) 5 MG tablet TAKE 1 TABLET (5 MG TOTAL) BY MOUTH DAILY. PLEASE CONTACT OFFICE FOR ADDITIONAL REFILLS 90 tablet 0  . aspirin EC 81 MG tablet Take 81 mg by mouth every morning.     . diphenhydrAMINE (BENADRYL) 25 mg capsule Take 50 mg by mouth at bedtime. For sleep    . furosemide (LASIX) 20 MG tablet Take 20 mg by mouth daily.    Marland Kitchen losartan (COZAAR) 100 MG tablet TAKE 1 TABLET (100 MG TOTAL) BY MOUTH DAILY. PLEASE CONTACT OFFICE FOR ADDITIONAL REFILLS 90 tablet 0  .  omeprazole (PRILOSEC) 20 MG capsule Take 20 mg by mouth every morning.     . potassium citrate (UROCIT-K) 10 MEQ (1080 MG) SR tablet Take 20 mEq by mouth 2 (two) times daily.    . pravastatin (PRAVACHOL) 20 MG tablet Take 20 mg by mouth at bedtime.      No current facility-administered medications on file prior to visit.     No Known Allergies  Past Medical History:  Diagnosis Date  . Arthritis 10-22-11   hx. osteoarthritis-knees  . Ascending aortic aneurysm [facility])    Last CT in July 2012  . Cancer ([place])    right kidney  . Coronary artery disease    minimal per cath in 2011  . Dyspnea   . GERD (gastroesophageal reflux disease)   . Heart murmur   . Hypertension   . Lactose intolerance   . Left bundle branch block   . Nephrolithiasis     Past Surgical History:  Procedure Laterality Date  . APPENDECTOMY    . CARDIAC CATHETERIZATION  12/22/09   EF60%/minimal nonobstructive atherosclerotic coronary artery disease/normal lt ventricular function/mod aortic root dilatation  . CYSTOSCOPY WITH RETROGRADE PYELOGRAM, URETEROSCOPY AND STENT PLACEMENT Left 07/28/2012   Procedure: CYSTOSCOPY WITH RETROGRADE PYELOGRAM, LEFT URETEROSCOPY ;  Surgeon: Corey Gray, MD;  Location: [facility];  Service: Urology;  Laterality: Left;  . CYSTOSCOPY/RETROGRADE/URETEROSCOPY/STONE EXTRACTION WITH BASKET  10-22-11   1'12  .  ELBOW SURGERY  10-22-11   right elbow  . KNEE SURGERY     arthroscopic  . LUMBAR SPINE SURGERY  10-22-11   '03- Lumbar fusion -retained hardware(Corey [name])  . PARTIAL NEPHRECTOMY Right 8/13  . TOTAL KNEE ARTHROPLASTY     bilateral    History  Smoking Status  . Never Smoker  Smokeless Tobacco  . Never Used    History  Alcohol Use No    Family History  Problem Relation Age of Onset  . Cancer Father     pancreatic  . Heart attack Brother     Review of Systems: As noted in HPI. All other systems were reviewed and are negative.  Physical Exam: BP 136/78   Pulse 81   Ht 5\' 11"   (1.803 m)   Wt 205 lb (93 kg)   BMI 28.59 kg/m  Patient is  pleasant and in no acute distress.  Skin is warm and dry. Color is normal.  HEENT is unremarkable. Normocephalic/atraumatic. PERRL. Sclera are nonicteric. Neck is supple. No masses. No JVD or bruits. Lungs are clear. Cardiac exam shows a regular rate and rhythm. Normal S1-2. No murmur or gallop. Abdomen is soft. Extremities are without edema. Gait and ROM are intact. No gross neurologic deficits noted.   LABORATORY DATA:  Lab Results  Component Value Date   WBC 4.5 10/31/2014   HGB 16.3 10/31/2014   HCT 46.2 10/31/2014   PLT 173 10/31/2014   GLUCOSE 99 10/31/2014   ALT 29 08/27/2006   AST 23 08/27/2006   NA 138 10/31/2014   K 4.8 10/31/2014   CL 106 10/31/2014   CREATININE 1.59 (H) 10/31/2014   BUN 25 (H) 10/31/2014   CO2 27 10/31/2014   INR 1.4 09/04/2006    Labs dated 01/17/16: cholesterol 152, triglycerides 122, LDL 87, HDL 41, A1c 5.4%. BUN 25, creatinine 1.4. Other CMET normal.    CT CHEST WITHOUT CONTRAST  TECHNIQUE: Multidetector CT imaging of the chest was performed following the standard protocol without intravenous contrast. High resolution imaging of the lungs, as well as inspiratory and expiratory imaging, was performed.  COMPARISON: Chest CT 12/27/2013.  FINDINGS: Mediastinum/Lymph Nodes: Heart size is normal. There is no significant pericardial fluid, thickening or pericardial calcification. Aneurysmal dilatation of the ascending thoracic aorta which measures up to 4.5 cm in diameter (unchanged compared to 12/27/2013). No pathologically enlarged mediastinal or hilar lymph nodes. Please note that accurate exclusion of hilar adenopathy is limited on noncontrast CT scans. Esophagus is unremarkable in appearance. No axillary lymphadenopathy.  Lungs/Pleura: High-resolution images demonstrate no areas of significant ground-glass attenuation, subpleural reticulation, parenchymal banding,  traction bronchiectasis or frank honeycombing. Inspiratory and expiratory images demonstrate minimal air trapping in the lung bases, indicative of very mild small airways disease. Few scattered tiny new 2-4 mm pulmonary nodules are noted in the lungs bilaterally, largest of which measures 4 mm in the lateral segment of the right middle lobe (image 38 of series 6), unchanged compared to prior study 11/29/2010. No larger more suspicious appearing pulmonary nodules or masses are otherwise noted. No acute consolidative airspace disease. No pleural effusions.  Upper Abdomen: Unremarkable.  Musculoskeletal/Soft Tissues: There are no aggressive appearing lytic or blastic lesions noted in the visualized portions of the skeleton.  IMPRESSION: 1. No findings to suggest interstitial lung disease. 2. Minimal air trapping in the lung bases bilaterally, indicative of very mild small airways disease. 3. Unchanged 4.5 cm ascending thoracic aortic aneurysm. Ascending thoracic aortic aneurysm. Recommend semi-annual imaging followup  by CTA or MRA and referral to cardiothoracic surgery if not already obtained. This recommendation follows 2010 ACCF/AHA/AATS/ACR/ASA/SCA/SCAI/SIR/STS/SVM Guidelines for the Diagnosis and Management of Patients With Thoracic Aortic Disease. Circulation. 2010; 121: [id number]. 4. Stable tiny pulmonary nodules dating back to 11/29/2010, considered benign, as above.   Electronically Signed  By: Vinnie Langton M.D.  On: 12/06/2014 08:57  Ecg today shows NSR with LBBB. I have personally reviewed and interpreted this study.   Assessment / Plan: 1. HTN- BP under good control. Off metoprolol now due to dyspnea. Unclear if this has made a difference.   2. Thoracic aortic aneurysm.  4.5 cm. Repeat CTA of the chest to follow.   3. CAD- minimal by prior cath.  4. LBBB chronic.  5. Dyspnea. No clear etiology based on prior cardiac and pulmonary work up. Symptoms  clinically improved with cooler weather. Will continue to monitor.   I will follow up in 6 months.

## 2016-03-16 ENCOUNTER — Encounter: Payer: Self-pay | Admitting: Cardiology

## 2016-03-16 ENCOUNTER — Ambulatory Visit (INDEPENDENT_AMBULATORY_CARE_PROVIDER_SITE_OTHER): Payer: Medicare Other | Admitting: Cardiology

## 2016-03-16 VITALS — BP 136/78 | HR 81 | Ht 71.0 in | Wt 205.0 lb

## 2016-03-16 DIAGNOSIS — I712 Thoracic aortic aneurysm, without rupture, unspecified: Secondary | ICD-10-CM

## 2016-03-16 DIAGNOSIS — I2510 Atherosclerotic heart disease of native coronary artery without angina pectoris: Secondary | ICD-10-CM

## 2016-03-16 DIAGNOSIS — I10 Essential (primary) hypertension: Secondary | ICD-10-CM

## 2016-03-16 DIAGNOSIS — R0602 Shortness of breath: Secondary | ICD-10-CM | POA: Diagnosis not present

## 2016-03-16 DIAGNOSIS — I447 Left bundle-branch block, unspecified: Secondary | ICD-10-CM | POA: Diagnosis not present

## 2016-03-16 MED ORDER — ALPRAZOLAM 0.25 MG PO TABS: 0.2500 mg | ORAL_TABLET | Freq: Two times a day (BID) | ORAL | 0 refills | Status: DC | PRN

## 2016-03-16 NOTE — Patient Instructions (Addendum)
  We will schedule you for a CT scan to follow up your aorta.  Continue your current therapy  I will see you in one year

## 2016-03-20 ENCOUNTER — Telehealth: Payer: Self-pay

## 2016-03-20 NOTE — Telephone Encounter (Signed)
Called patient no answer.[facility] will need a bmet before chest ct.

## 2016-03-22 DIAGNOSIS — I447 Left bundle-branch block, unspecified: Secondary | ICD-10-CM | POA: Diagnosis not present

## 2016-03-22 DIAGNOSIS — I10 Essential (primary) hypertension: Secondary | ICD-10-CM | POA: Diagnosis not present

## 2016-03-22 DIAGNOSIS — I2510 Atherosclerotic heart disease of native coronary artery without angina pectoris: Secondary | ICD-10-CM | POA: Diagnosis not present

## 2016-03-22 DIAGNOSIS — R0602 Shortness of breath: Secondary | ICD-10-CM | POA: Diagnosis not present

## 2016-03-22 LAB — BASIC METABOLIC PANEL
BUN: 19 mg/dL (ref 7–25)
CHLORIDE: 106 mmol/L (ref 98–110)
CO2: 21 mmol/L (ref 20–31)
Calcium: 9.1 mg/dL (ref 8.6–10.3)
Creat: 1.55 mg/dL — ABNORMAL HIGH (ref 0.70–1.18)
Glucose, Bld: 142 mg/dL — ABNORMAL HIGH (ref 65–99)
POTASSIUM: 4.2 mmol/L (ref 3.5–5.3)
Sodium: 140 mmol/L (ref 135–146)

## 2016-04-06 ENCOUNTER — Ambulatory Visit (INDEPENDENT_AMBULATORY_CARE_PROVIDER_SITE_OTHER)
Admission: RE | Admit: 2016-04-06 | Discharge: 2016-04-06 | Disposition: A | Discharge status: Home / Self Care | Payer: Medicare Other | Source: Ambulatory Visit | Attending: Cardiology | Admitting: Cardiology

## 2016-04-06 DIAGNOSIS — I712 Thoracic aortic aneurysm, without rupture, unspecified: Secondary | ICD-10-CM

## 2016-04-06 MED ORDER — IOPAMIDOL (ISOVUE-370) INJECTION 76%
80.0000 mL | Freq: Once | INTRAVENOUS | Status: AC | PRN
  Administered 2016-04-06: 80 mL via INTRAVENOUS

## 2016-04-11 ENCOUNTER — Telehealth: Payer: Self-pay | Admitting: Cardiology

## 2016-04-11 NOTE — Telephone Encounter (Signed)
Returned call to patient no answer.LMTC. 

## 2016-04-11 NOTE — Telephone Encounter (Signed)
Returned call to patient no answer.Left chest ct results on personal voice mail.

## 2016-04-11 NOTE — Telephone Encounter (Signed)
Pt returning call again at 359pm, pls call cell

## 2016-04-11 NOTE — Telephone Encounter (Signed)
Returning your call. °

## 2016-04-11 NOTE — Telephone Encounter (Signed)
F/u Message  Pt states he was returning RN call. Please call back if needed.

## 2016-04-13 ENCOUNTER — Other Ambulatory Visit: Payer: Self-pay | Admitting: Cardiology

## 2016-04-30 ENCOUNTER — Other Ambulatory Visit: Payer: Self-pay | Admitting: Cardiology

## 2016-05-01 NOTE — Telephone Encounter (Signed)
To provider not pharmacy

## 2016-05-01 NOTE — Telephone Encounter (Signed)
Ok to refill Xanax  Suri Tafolla MD, FACC   

## 2016-05-02 ENCOUNTER — Other Ambulatory Visit: Payer: Self-pay | Admitting: Cardiology

## 2016-05-02 MED ORDER — ALPRAZOLAM 0.25 MG PO TABS: ORAL_TABLET | ORAL | 1 refills | Status: DC

## 2016-05-02 NOTE — Addendum Note (Signed)
Addended by: Kathyrn Lass on: 05/02/2016 05:41 PM   Modules accepted: Orders

## 2016-06-27 DIAGNOSIS — D1722 Benign lipomatous neoplasm of skin and subcutaneous tissue of left arm: Secondary | ICD-10-CM | POA: Diagnosis not present

## 2016-06-27 DIAGNOSIS — L821 Other seborrheic keratosis: Secondary | ICD-10-CM | POA: Diagnosis not present

## 2016-06-27 DIAGNOSIS — D2261 Melanocytic nevi of right upper limb, including shoulder: Secondary | ICD-10-CM | POA: Diagnosis not present

## 2016-06-27 DIAGNOSIS — D220 Melanocytic nevi of lip: Secondary | ICD-10-CM | POA: Diagnosis not present

## 2016-06-27 DIAGNOSIS — L738 Other specified follicular disorders: Secondary | ICD-10-CM | POA: Diagnosis not present

## 2016-06-27 DIAGNOSIS — L570 Actinic keratosis: Secondary | ICD-10-CM | POA: Diagnosis not present

## 2016-06-27 DIAGNOSIS — L300 Nummular dermatitis: Secondary | ICD-10-CM | POA: Diagnosis not present

## 2016-06-27 DIAGNOSIS — D225 Melanocytic nevi of trunk: Secondary | ICD-10-CM | POA: Diagnosis not present

## 2016-06-27 DIAGNOSIS — Z85820 Personal history of malignant melanoma of skin: Secondary | ICD-10-CM | POA: Diagnosis not present

## 2016-06-27 DIAGNOSIS — L814 Other melanin hyperpigmentation: Secondary | ICD-10-CM | POA: Diagnosis not present

## 2016-06-27 DIAGNOSIS — D2262 Melanocytic nevi of left upper limb, including shoulder: Secondary | ICD-10-CM | POA: Diagnosis not present

## 2016-06-27 DIAGNOSIS — D2272 Melanocytic nevi of left lower limb, including hip: Secondary | ICD-10-CM | POA: Diagnosis not present

## 2016-07-07 ENCOUNTER — Other Ambulatory Visit: Payer: Self-pay | Admitting: Cardiology

## 2016-07-10 MED ORDER — ALPRAZOLAM 0.25 MG PO TABS: ORAL_TABLET | ORAL | 1 refills | Status: DC

## 2016-07-10 NOTE — Telephone Encounter (Signed)
Ok to refill Xanax  Dorothy Polhemus MD, FACC   

## 2016-07-10 NOTE — Addendum Note (Signed)
Addended by: Kathyrn Lass on: 07/10/2016 04:47 PM   Modules accepted: Orders

## 2016-07-10 NOTE — Telephone Encounter (Signed)
Ok to refill Xanax  Raizy Auzenne MD, FACC   

## 2016-08-09 DIAGNOSIS — M1611 Unilateral primary osteoarthritis, right hip: Secondary | ICD-10-CM | POA: Diagnosis not present

## 2016-08-09 DIAGNOSIS — M545 Low back pain: Secondary | ICD-10-CM | POA: Diagnosis not present

## 2016-08-09 DIAGNOSIS — Z96653 Presence of artificial knee joint, bilateral: Secondary | ICD-10-CM | POA: Diagnosis not present

## 2016-08-16 DIAGNOSIS — M25551 Pain in right hip: Secondary | ICD-10-CM | POA: Diagnosis not present

## 2016-08-16 DIAGNOSIS — M1611 Unilateral primary osteoarthritis, right hip: Secondary | ICD-10-CM | POA: Diagnosis not present

## 2016-09-10 DIAGNOSIS — N200 Calculus of kidney: Secondary | ICD-10-CM | POA: Diagnosis not present

## 2016-09-25 ENCOUNTER — Other Ambulatory Visit: Payer: Self-pay | Admitting: Cardiology

## 2016-09-26 NOTE — Telephone Encounter (Signed)
It is OK to refill his Xanax  Debby Clyne Martinique MD, [facility]

## 2016-09-27 ENCOUNTER — Other Ambulatory Visit: Payer: Self-pay | Admitting: Cardiology

## 2016-09-27 NOTE — Telephone Encounter (Signed)
OK to refill his alprazolam  Law Corsino Martinique MD, [facility]

## 2016-10-16 ENCOUNTER — Other Ambulatory Visit: Payer: Self-pay | Admitting: Cardiology

## 2016-10-22 ENCOUNTER — Other Ambulatory Visit: Payer: Self-pay | Admitting: Cardiology

## 2016-10-22 NOTE — Telephone Encounter (Signed)
OK to renew Xanax  Kingsley Farace Martinique MD, [facility]

## 2016-11-27 DIAGNOSIS — Z23 Encounter for immunization: Secondary | ICD-10-CM | POA: Diagnosis not present

## 2017-01-24 DIAGNOSIS — I10 Essential (primary) hypertension: Secondary | ICD-10-CM | POA: Diagnosis not present

## 2017-01-24 DIAGNOSIS — Z125 Encounter for screening for malignant neoplasm of prostate: Secondary | ICD-10-CM | POA: Diagnosis not present

## 2017-01-24 DIAGNOSIS — R7309 Other abnormal glucose: Secondary | ICD-10-CM | POA: Diagnosis not present

## 2017-01-24 DIAGNOSIS — E7849 Other hyperlipidemia: Secondary | ICD-10-CM | POA: Diagnosis not present

## 2017-01-24 DIAGNOSIS — R82998 Other abnormal findings in urine: Secondary | ICD-10-CM | POA: Diagnosis not present

## 2017-01-29 DIAGNOSIS — E7849 Other hyperlipidemia: Secondary | ICD-10-CM | POA: Diagnosis not present

## 2017-01-29 DIAGNOSIS — R7309 Other abnormal glucose: Secondary | ICD-10-CM | POA: Diagnosis not present

## 2017-01-29 DIAGNOSIS — Z6829 Body mass index (BMI) 29.0-29.9, adult: Secondary | ICD-10-CM | POA: Diagnosis not present

## 2017-01-29 DIAGNOSIS — N200 Calculus of kidney: Secondary | ICD-10-CM | POA: Diagnosis not present

## 2017-01-29 DIAGNOSIS — Z0000 Encounter for general adult medical examination without abnormal findings: Secondary | ICD-10-CM | POA: Diagnosis not present

## 2017-01-29 DIAGNOSIS — N183 Chronic kidney disease, stage 3 (moderate): Secondary | ICD-10-CM | POA: Diagnosis not present

## 2017-01-29 DIAGNOSIS — I712 Thoracic aortic aneurysm, without rupture: Secondary | ICD-10-CM | POA: Diagnosis not present

## 2017-01-29 DIAGNOSIS — D1720 Benign lipomatous neoplasm of skin and subcutaneous tissue of unspecified limb: Secondary | ICD-10-CM | POA: Diagnosis not present

## 2017-01-29 DIAGNOSIS — I10 Essential (primary) hypertension: Secondary | ICD-10-CM | POA: Diagnosis not present

## 2017-01-29 DIAGNOSIS — C649 Malignant neoplasm of unspecified kidney, except renal pelvis: Secondary | ICD-10-CM | POA: Diagnosis not present

## 2017-01-29 DIAGNOSIS — Z1389 Encounter for screening for other disorder: Secondary | ICD-10-CM | POA: Diagnosis not present

## 2017-02-18 DIAGNOSIS — M1611 Unilateral primary osteoarthritis, right hip: Secondary | ICD-10-CM | POA: Diagnosis not present

## 2017-02-20 DIAGNOSIS — M1611 Unilateral primary osteoarthritis, right hip: Secondary | ICD-10-CM | POA: Diagnosis not present

## 2017-02-21 ENCOUNTER — Telehealth: Payer: Self-pay | Admitting: *Deleted

## 2017-02-21 NOTE — Telephone Encounter (Signed)
   [facility] Pre-operative Risk Assessment    Request for surgical clearance:  1. What type of surgery is being performed? RIGHT TOTAL HIP REPLACEMENT   2. When is this surgery scheduled? 03/26/17   3. Are there any medications that need to be held prior to surgery and how long?NONE   4. Practice name and name of physician performing surgery? Edmonia Lynch MD   5. What is your office phone and fax number? PH=[phone]  FAX=[phone]   6. Anesthesia type (None, local, MAC, general) ? UNKNOWN   Corey [name] 02/21/2017, 3:08 PM  _________________________________________________________________   (provider comments below)

## 2017-02-23 NOTE — Telephone Encounter (Signed)
   Primary Cardiologist:Peter Martinique, MD  Chart reviewed as part of pre-operative protocol coverage. Because of Con Arganbright Tadros's past medical history and time since last visit, he/she will require a follow-up visit in order to better assess preoperative cardiovascular risk.  He is DUE for yearly follow-up. Already has annual f/u scheduled 03/04/17 with Bernerd Pho at which time clearance can be discussed (will cc to make her aware).  Pre-op covering staff: - Please edit appt info to relfect need for clearance too - Please contact requesting surgeon's office via preferred method (i.e, phone, fax) to inform them of need for appointment prior to surgery.  Charlie Pitter, PA-C  02/23/2017, 2:58 PM

## 2017-02-25 ENCOUNTER — Ambulatory Visit: Payer: Medicare Other | Admitting: Cardiology

## 2017-02-28 NOTE — Telephone Encounter (Signed)
Appointment note updated for 03-04-17 and this note faxed to the number provided.

## 2017-03-03 NOTE — Progress Notes (Signed)
Cardiology Office Note    Date:  03/04/2017   ID:  Corey [name], DOB 11-25-1945, MRN [id number]  PCP:  Leanna Battles, MD  Cardiologist: Dr. Martinique  Chief Complaint  Patient presents with  . Cardiac Clearance    pt having hip replacement in january. pt reports no complaints    History of Present Illness:    Corey [name] is a 71 y.o. male with past medical history of thoracic aortic aneurysm, Stage 3 CKD, chronic LBBB, and minimal CAD by cath in 2011 who presents to the office today for preoperative cardiac clearance.   He was last examined by Dr. Martinique in 02/2016 and reported having dyspnea on exertion in the warmer temperatures but denied any associated chest discomfort. A repeat CTA was obtained in 03/2016 and showed a stable thoracic aortic aneurysm measuring 4.5cm in diameter with no acute changes (at 4.5 cm in 2015).   The office was contacted in regards to preoperative cardiac clearance for an upcoming hip replacement, therefore close follow-up was arranged.   In talking with patient today, he reports overall doing well since his last office visit.  He exercises at the [facility] 3 times per week and uses the recumbent bike along with elliptical. He denies any exertional symptoms with this. He does play golf regularly and notes that he has shortness of breath when walking the course in warm temperatures. Denies any dyspnea or chest discomfort otherwise.  He denies any recent orthopnea, PND, lower extremity edema, or palpitations. He does check his blood pressure regularly and reports it is well controlled. BP is at 108/62 during today's visit.   Past Medical History:  Diagnosis Date  . Arthritis 10-22-11   hx. osteoarthritis-knees  . Ascending aortic aneurysm [facility])    Last CT in July 2012  . Cancer ([place])    right kidney  . Coronary artery disease    minimal per cath in 2011  . Dyspnea   . GERD (gastroesophageal reflux disease)   . Heart murmur   . Hypertension   .  Lactose intolerance   . Left bundle branch block   . Nephrolithiasis     Past Surgical History:  Procedure Laterality Date  . APPENDECTOMY    . CARDIAC CATHETERIZATION  12/22/09   EF60%/minimal nonobstructive atherosclerotic coronary artery disease/normal lt ventricular function/mod aortic root dilatation  . CYSTOSCOPY WITH RETROGRADE PYELOGRAM, URETEROSCOPY AND STENT PLACEMENT Left 07/28/2012   Procedure: CYSTOSCOPY WITH RETROGRADE PYELOGRAM, LEFT URETEROSCOPY ;  Surgeon: Dutch Gray, MD;  Location: [facility];  Service: Urology;  Laterality: Left;  . CYSTOSCOPY/RETROGRADE/URETEROSCOPY/STONE EXTRACTION WITH BASKET  10-22-11   1'12  . ELBOW SURGERY  10-22-11   right elbow  . KNEE SURGERY     arthroscopic  . LUMBAR SPINE SURGERY  10-22-11   '03- Lumbar fusion -retained hardware([name])  . PARTIAL NEPHRECTOMY Right 8/13  . TOTAL KNEE ARTHROPLASTY     bilateral    Current Medications: Outpatient Medications Prior to Visit  Medication Sig Dispense Refill  . albuterol (PROVENTIL HFA;VENTOLIN HFA) 108 (90 BASE) MCG/ACT inhaler Inhale 2 puffs into the lungs every 4 (four) hours as needed for wheezing or shortness of breath (for heat related breathing issues).    . ALPRAZolam (XANAX) 0.25 MG tablet TAKE 1 TABLET BY MOUTH TWICE A DAY AS NEEDED 60 tablet 1  . amLODipine (NORVASC) 5 MG tablet Take 1 tablet (5 mg total) by mouth daily. 90 tablet 3  . aspirin EC 81 MG tablet Take 81  mg by mouth every morning.     . diclofenac (VOLTAREN) 25 MG EC tablet Take 25 mg by mouth daily.  1  . diphenhydrAMINE (BENADRYL) 25 mg capsule Take 50 mg by mouth at bedtime. For sleep    . losartan (COZAAR) 100 MG tablet Take 1 tablet (100 mg total) by mouth daily. 90 tablet 3  . omeprazole (PRILOSEC) 20 MG capsule Take 20 mg by mouth every morning.     . potassium citrate (UROCIT-K) 10 MEQ (1080 MG) SR tablet Take 20 mEq by mouth 2 (two) times daily.    . pravastatin (PRAVACHOL) 40 MG tablet Take 40 mg by mouth daily.  3   . Silodosin (RAPAFLO PO) Take 1 tablet by mouth daily.    . tamsulosin (FLOMAX) 0.4 MG CAPS capsule Take 0.4 mg by mouth daily.  11  . furosemide (LASIX) 20 MG tablet Take 20 mg by mouth daily.    . pravastatin (PRAVACHOL) 20 MG tablet Take 20 mg by mouth at bedtime.      No facility-administered medications prior to visit.      Allergies:   Patient has no known allergies.   Social History   Socioeconomic History  . Marital status: Married    Spouse name: None  . Number of children: 4  . Years of education: None  . Highest education level: None  Social Needs  . Financial resource strain: None  . Food insecurity - worry: None  . Food insecurity - inability: None  . Transportation needs - medical: None  . Transportation needs - non-medical: None  Occupational History  . Occupation: sales [employer]  Tobacco Use  . Smoking status: Never Smoker  . Smokeless tobacco: Never Used  Substance and Sexual Activity  . Alcohol use: No  . Drug use: No  . Sexual activity: Yes  Other Topics Concern  . None  Social History Narrative  . None     Family History:  The patient's family history includes Cancer in his father; Heart attack in his brother.   Review of Systems:   Please see the history of present illness.     General:  No chills, fever, night sweats or weight changes.  Cardiovascular:  No chest pain, edema, orthopnea, palpitations, paroxysmal nocturnal dyspnea. Positive for dyspnea on exertion (only occurring in warm temperatures).  Dermatological: No rash, lesions/masses Respiratory: No cough, dyspnea Urologic: No hematuria, dysuria MSK: Positive for hip pain.  Abdominal:   No nausea, vomiting, diarrhea, bright red blood per rectum, melena, or hematemesis Neurologic:  No visual changes, wkns, changes in mental status. All other systems reviewed and are otherwise negative except as noted above.   Physical Exam:    VS:  BP 108/62   Pulse 95   Ht 5' 11.5" (1.816 m)   Wt  202 lb 6.4 oz (91.8 kg)   BMI 27.84 kg/m    General: Well developed, well nourished Caucasian male appearing in no acute distress. Head: Normocephalic, atraumatic, sclera non-icteric, no xanthomas, nares are without discharge.  Neck: No carotid bruits. JVD not elevated.  Lungs: Respirations regular and unlabored, without wheezes or rales.  Heart: Regular rate and rhythm. No S3 or S4.  No murmur, no rubs, or gallops appreciated. Abdomen: Soft, non-tender, non-distended with normoactive bowel sounds. No hepatomegaly. No rebound/guarding. No obvious abdominal masses. Msk:  Strength and tone appear normal for age. No joint deformities or effusions. Extremities: No clubbing or cyanosis. No lower extremity edema.  Distal pedal pulses are 2+  bilaterally. Neuro: Alert and oriented X 3. Moves all extremities spontaneously. No focal deficits noted. Psych:  Responds to questions appropriately with a normal affect. Skin: No rashes or lesions noted  Wt Readings from Last 3 Encounters:  03/04/17 202 lb 6.4 oz (91.8 kg)  03/16/16 205 lb (93 kg)  01/06/15 211 lb (95.7 kg)     Studies/Labs Reviewed:   EKG:  EKG is ordered today.  The ekg ordered today demonstrates NSR, HR 95, with known LBBB.   Recent Labs: 03/22/2016: BUN 19; Creat 1.55; Potassium 4.2; Sodium 140   Lipid Panel No results found for: CHOL, TRIG, HDL, CHOLHDL, VLDL, LDLCALC, LDLDIRECT  Additional studies/ records that were reviewed today include:   Cardiac Catheterization: 2011    Echocardiogram: 07/2013 Study Conclusions  - Left ventricle: Abnormal septal motion. The cavity size was normal. Wall thickness was increased in a pattern of moderate LVH. Systolic function was normal. The estimated ejection fraction was in the range of 50% to 55%. Doppler parameters are consistent with abnormal left ventricular relaxation (grade 1 diastolic dysfunction). - Atrial septum: No defect or patent foramen ovale was  identified.  Assessment:    1. Pre-operative cardiovascular examination   2. Essential hypertension   3. LBBB (left bundle branch block)   4. Hyperlipidemia LDL goal <70   5. Thoracic aortic aneurysm without rupture ([place])   6. CKD (chronic kidney disease) stage 3, GFR 30-59 ml/min (HCC)      Plan:   In order of problems listed above:  1. Preoperative Cardiac Clearance for Hip Replacement - the patient is planning to undergo hip replacement within the next two months. He denies any recent anginal symptoms and exercises 3 times per week without any chest pain or dyspnea on exertion.  - EKG today shows his known LBBB which has been present for 20+ years by his report. No known history of obstructive CAD with cath in 2011 showing minimal CAD. Recent Chest CT showed a stable aortic aneurysm.  - overall his preoperative RCRI risk score is Class I and low at 0.4%. He is of acceptable risk to proceed without the need for additional cardiac testing. Can hold ASA for 5 days prior to the procedure. Will forward today's note to his Orthopedic Surgeon's office.   2. HTN - BP is well-controlled at 108/62 during today's visit. - continue Amlodipine 5mg  daily and Losartan 100mg  daily. Recheck BMET to assess kidney function.   3. LBBB - chronic for 20+ years. Catheterization in 2011 showed minimal CAD.  - continue ASA and statin therapy.   4. HLD - followed by PCP. Remains on Pravastatin 20mg  daily.   5. Thoracic Aortic Aneurysm - CTA in 03/2016 and showed a stable thoracic aortic aneurysm measuring 4.5cm in diameter with no acute changes (at 4.5 cm in 2015).  - plan for repeat imaging in 04/2016.  6. Stage 3 CKD - baseline creatinine 1.5 - 1.6. Will recheck creatinine prior to obtaining CTA. If above baseline, will perform ultrasound imaging.     Medication Adjustments/Labs and Tests Ordered: Current medicines are reviewed at length with the patient today.  Concerns regarding medicines are  outlined above.  Medication changes, Labs and Tests ordered today are listed in the Patient Instructions below. Patient Instructions  Medication Instructions: Your physician recommends that you continue on your current medications as directed. Please refer to the Current Medication list given to you today.  If you need a refill on your cardiac medications before your next appointment,  please call your pharmacy.   Labs: Your physician recommends that you return for lab work a week prior to the CT for the following labs: BMET   Procedures/Testing: A CT Angio Chest Aorta has been ordered. This can be done in February after you have had your surgery.  Follow-Up: Your physician wants you to follow-up in: 12 months with Dr. Martinique. You will receive a reminder letter in the mail two months in advance. If you don't receive a letter, please call our office at [phone] to schedule this follow-up appointment.   Thank you for choosing Heartcare at [facility]!!      Signed, Erma Heritage, PA-C  03/04/2017 12:25 PM    [place] [place] [address], [place], [place]  [address] Phone: [phone]; Fax: [phone]  [address], [place], [place] [address] Phone: [phone]

## 2017-03-04 ENCOUNTER — Encounter: Payer: Self-pay | Admitting: Student

## 2017-03-04 ENCOUNTER — Encounter (INDEPENDENT_AMBULATORY_CARE_PROVIDER_SITE_OTHER): Payer: Self-pay

## 2017-03-04 ENCOUNTER — Ambulatory Visit (INDEPENDENT_AMBULATORY_CARE_PROVIDER_SITE_OTHER): Payer: Medicare Other | Admitting: Student

## 2017-03-04 VITALS — BP 108/62 | HR 95 | Ht 71.5 in | Wt 202.4 lb

## 2017-03-04 DIAGNOSIS — Z01810 Encounter for preprocedural cardiovascular examination: Secondary | ICD-10-CM

## 2017-03-04 DIAGNOSIS — N1831 Chronic kidney disease, stage 3a: Secondary | ICD-10-CM | POA: Insufficient documentation

## 2017-03-04 DIAGNOSIS — E785 Hyperlipidemia, unspecified: Secondary | ICD-10-CM

## 2017-03-04 DIAGNOSIS — I712 Thoracic aortic aneurysm, without rupture, unspecified: Secondary | ICD-10-CM

## 2017-03-04 DIAGNOSIS — N183 Chronic kidney disease, stage 3 unspecified: Secondary | ICD-10-CM | POA: Insufficient documentation

## 2017-03-04 DIAGNOSIS — I447 Left bundle-branch block, unspecified: Secondary | ICD-10-CM

## 2017-03-04 DIAGNOSIS — I10 Essential (primary) hypertension: Secondary | ICD-10-CM

## 2017-03-04 NOTE — Patient Instructions (Addendum)
Medication Instructions: Your physician recommends that you continue on your current medications as directed. Please refer to the Current Medication list given to you today.  If you need a refill on your cardiac medications before your next appointment, please call your pharmacy.   Labs: Your physician recommends that you return for lab work a week prior to the CT for the following labs: BMET   Procedures/Testing: A CT Angio Chest Aorta has been ordered. This can be done in February after you have had your surgery.  Follow-Up: Your physician wants you to follow-up in: 12 months with Dr. Martinique. You will receive a reminder letter in the mail two months in advance. If you don't receive a letter, please call our office at [phone] to schedule this follow-up appointment.   Thank you for choosing Heartcare at [facility]!!

## 2017-03-05 DIAGNOSIS — M1611 Unilateral primary osteoarthritis, right hip: Secondary | ICD-10-CM | POA: Diagnosis not present

## 2017-03-05 NOTE — H&P (Signed)
PREOPERATIVE H&P Patient ID: CARMAN ESSICK MRN: [id number] DOB/AGE: 71-May-1947 71 y.o.  Chief Complaint: DJD RIGHT HIP  Planned Procedure Date: 03/26/2017 Medical Clearance by Dr. Sharlett Iles pending Cardiac Clearance by Dr. Martinique pending Additional clearance by urology Dr. Alinda Money  HPI: SAED HUDLOW is a 71 y.o. male who presents for evaluation of DJD RIGHT HIP. The patient has a history of pain and functional disability in the right hip due to arthritis and has failed non-surgical conservative treatments for greater than 12 weeks to include NSAID's and/or analgesics, corticosteriod injections and activity modification.  Onset of symptoms was gradual, starting 2 years ago with gradually worsening course since that time.  Patient currently rates pain at 7 out of 10 with activity. Patient has night pain, worsening of pain with activity and weight bearing and pain that interferes with activities of daily living.  Patient has evidence of subchondral sclerosis, periarticular osteophytes and joint space narrowing by imaging studies. There is no active infection.  Past Medical History:  Diagnosis Date  . Arthritis 10-22-11   hx. osteoarthritis-knees  . Ascending aortic aneurysm [facility])    Last CT in July 2012  . Cancer ([place])    right kidney  . Coronary artery disease    minimal per cath in 2011  . Dyspnea   . GERD (gastroesophageal reflux disease)   . Heart murmur   . Hypertension   . Lactose intolerance   . Left bundle branch block   . Nephrolithiasis    Past Surgical History:  Procedure Laterality Date  . APPENDECTOMY    . CARDIAC CATHETERIZATION  12/22/09   EF60%/minimal nonobstructive atherosclerotic coronary artery disease/normal lt ventricular function/mod aortic root dilatation  . CYSTOSCOPY WITH RETROGRADE PYELOGRAM, URETEROSCOPY AND STENT PLACEMENT Left 07/28/2012   Procedure: CYSTOSCOPY WITH RETROGRADE PYELOGRAM, LEFT URETEROSCOPY ;  Surgeon: Dutch Gray, MD;  Location: [facility];   Service: Urology;  Laterality: Left;  . CYSTOSCOPY/RETROGRADE/URETEROSCOPY/STONE EXTRACTION WITH BASKET  10-22-11   1'12  . ELBOW SURGERY  10-22-11   right elbow  . KNEE SURGERY     arthroscopic  . LUMBAR SPINE SURGERY  10-22-11   '03- Lumbar fusion -retained hardware([name])  . PARTIAL NEPHRECTOMY Right 8/13  . TOTAL KNEE ARTHROPLASTY     bilateral   No Known Allergies   Prior to Admission medications   Medication Sig Start Date End Date Taking? Authorizing Provider  albuterol (PROVENTIL HFA;VENTOLIN HFA) 108 (90 BASE) MCG/ACT inhaler Inhale 2 puffs into the lungs every 4 (four) hours as needed for wheezing or shortness of breath (for heat related breathing issues).   Yes [provider]  ALPRAZolam (XANAX) 0.25 MG tablet TAKE 1 TABLET BY MOUTH TWICE A DAY AS NEEDED Patient taking differently: Take 0.25 mg by mouth at bedtime 10/23/16  Yes Martinique, Peter M, MD  amLODipine (NORVASC) 5 MG tablet Take 1 tablet (5 mg total) by mouth daily. 04/13/16  Yes Martinique, Peter M, MD  aspirin EC 81 MG tablet Take 81 mg by mouth every morning.    Yes [provider]  diclofenac (VOLTAREN) 25 MG EC tablet Take 25 mg by mouth daily. 01/18/17  Yes [provider]  diphenhydrAMINE (BENADRYL) 25 mg capsule Take 50 mg by mouth at bedtime.    Yes [provider]  losartan (COZAAR) 100 MG tablet Take 1 tablet (100 mg total) by mouth daily. 04/13/16  Yes Martinique, Peter M, MD  Melatonin 5 MG CAPS Take 10 mg by mouth at bedtime.   Yes  [provider]  omeprazole (PRILOSEC) 20 MG capsule Take 20 mg by mouth every morning.    Yes [provider]  potassium citrate (UROCIT-K) 10 MEQ (1080 MG) SR tablet Take 20 mEq by mouth 2 (two) times daily.   Yes [provider]  pravastatin (PRAVACHOL) 40 MG tablet Take 40 mg by mouth daily. 02/18/17  Yes [provider]  tamsulosin (FLOMAX) 0.4 MG CAPS capsule Take 0.4 mg by mouth daily. 01/03/17  Yes [provider]   Social History   Socioeconomic History  . Marital status: Married    Spouse name: Not on file  . Number of children: 4  . Years of education: Not on file  . Highest education level: Not on file  Social Needs  . Financial resource strain: Not on file  . Food insecurity - worry: Not on file  . Food insecurity - inability: Not on file  . Transportation needs - medical: Not on file  . Transportation needs - non-medical: Not on file  Occupational History  . Occupation: sales [employer]  Tobacco Use  . Smoking status: Never Smoker  . Smokeless tobacco: Never Used  Substance and Sexual Activity  . Alcohol use: No  . Drug use: No  . Sexual activity: Yes  Other Topics Concern  . Not on file  Social History Narrative  . Not on file   Family History  Problem Relation Age of Onset  . Cancer Father        pancreatic  . Heart attack Brother     ROS: Currently denies lightheadedness, dizziness, Fever, chills, CP, SOB.   No personal history of DVT, PE, MI, or CVA. No loose teeth. All other systems have been reviewed and were otherwise currently negative with the exception of those mentioned in the HPI and as above.  Objective: Vitals: Ht: 5 feet 10 inches wt: 203 temp: 97.5 BP: 141/73 pulse: 86 O2 95 % on room air. Physical Exam: General: Alert, NAD.  Trendelenberg Gait  HEENT: EOMI, Good Neck Extension  Pulm: No increased work of breathing.  Clear B/L A/P w/o crackle or wheeze.  CV: RRR, No m/g/r appreciated  GI: soft, NT, ND Neuro: Neuro without gross focal deficit.  Sensation intact distally Skin: No lesions in the area of chief complaint MSK/Surgical Site: Right Hip Non tender over greater trochanter.  Pain with passive ROM.  Positive Stinchfield.  5/5 strength.  NVI.  Sensation intact distally.   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   Assessment: DJD RIGHT HIP Active Problems:   LACTOSE  INTOLERANCE   Essential hypertension   LBBB (left bundle branch block)   Aneurysm of thoracic aorta (HCC)   G E R D   Coronary artery disease   CKD (chronic kidney disease) stage 3, GFR 30-59 ml/min (HCC)   Plan: Plan for Procedure(s): TOTAL HIP ARTHROPLASTY  The patient history, physical exam, clinical judgement of the provider and imaging are consistent with end stage degenerative joint disease and total joint arthroplasty is deemed medically necessary. The treatment options including medical management, injection therapy, and arthroplasty were discussed at length. The risks and benefits of Procedure(s): TOTAL HIP ARTHROPLASTY were presented and reviewed.  The risks of nonoperative treatment, versus surgical intervention including but not limited to continued pain, aseptic loosening, stiffness, dislocation/subluxation, infection, bleeding, nerve injury, blood clots, cardiopulmonary complications, morbidity, mortality, among others were discussed. The patient verbalizes understanding and wishes to proceed with the plan.  Patient is being admitted for inpatient treatment for surgery, pain control, PT, OT, prophylactic antibiotics, VTE prophylaxis, progressive ambulation, ADL's and discharge planning.   Dental prophylaxis discussed and recommended for 2 years postoperatively.   The patient does meet the criteria for TXA which will be used perioperatively via IV.    Xarelto will be used postoperatively for DVT prophylaxis dt CKD in addition to SCDs, and early ambulation.  The patient is planning to be discharged home with home health services ([facility]).  Severity of Illness: The appropriate patient status for this patient is OBSERVATION. Observation status is judged to be reasonable and necessary in order to provide the required intensity of service to ensure the patient's safety. The patient's presenting symptoms, physical exam findings, and initial radiographic and laboratory data in the  context of their medical condition is felt to place them at decreased risk for further clinical deterioration. Furthermore, it is anticipated that the patient will be medically stable for discharge from the hospital within 2 midnights of admission. The following factors support the patient status of observation.    Prudencio Burly III, PA-C 03/05/2017 11:16 AM

## 2017-03-13 NOTE — Pre-Procedure Instructions (Addendum)
JAEVEON ASHLAND  03/13/2017      [id number] - [place], [place] [address] [place] [address] Phone: [phone] Fax: [phone]    Your procedure is scheduled on Tuesday, March 26, 2017  Report to [facility] Admitting Entrance "A" at 12:45PM  Call this number if you have problems the morning of surgery:  [phone]   Remember:  Do not eat food or drink liquids after midnight.  Take these medicines the morning of surgery with A SIP OF WATER: AmLODipine (NORVASC), Omeprazole (PRILOSEC), and Tamsulosin (FLOMAX). If needed Albuterol Inhaler for cough or wheezing (Bring with you the day of surgery).  Follow your doctor's instruction regarding Aspirin.  7 days before surgery (Jan. 1), stop taking all Aspirins, Vitamins, Fish oils, and Herbal medications. Also stop all NSAIDS i.e. Advil, Ibuprofen, Motrin, Aleve, Anaprox, Naproxen, BC and Goody Powders.   Do not wear jewelry.  Do not wear lotions, powders, colognes, or deodorant.  Do not shave 48 hours prior to surgery.  Men may shave face and neck.  Do not bring valuables to the hospital.  [facility] is not responsible for any belongings or valuables.  Contacts, dentures or bridgework may not be worn into surgery.  Leave your suitcase in the car.  After surgery it may be brought to your room.  For patients admitted to the hospital, discharge time will be determined by your treatment team.  Patients discharged the day of surgery will not be allowed to drive home.   Special instructions:   [place]- Preparing For Surgery  Before surgery, you can play an important role. Because skin is not sterile, your skin needs to be as free of germs as possible. You can reduce the number of germs on your skin by washing with CHG (chlorahexidine gluconate) Soap before surgery.  CHG is an antiseptic cleaner which kills germs and bonds with the skin to continue killing germs even after  washing.  Please do not use if you have an allergy to CHG or antibacterial soaps. If your skin becomes reddened/irritated stop using the CHG.  Do not shave (including legs and underarms) for at least 48 hours prior to first CHG shower. It is OK to shave your face.  Please follow these instructions carefully.   1. Shower the NIGHT BEFORE SURGERY and the MORNING OF SURGERY with CHG.   2. If you chose to wash your hair, wash your hair first as usual with your normal shampoo.  3. After you shampoo, rinse your hair and body thoroughly to remove the shampoo.  4. Use CHG as you would any other liquid soap. You can apply CHG directly to the skin and wash gently with a scrungie or a clean washcloth.   5. Apply the CHG Soap to your body ONLY FROM THE NECK DOWN.  Do not use on open wounds or open sores. Avoid contact with your eyes, ears, mouth and genitals (private parts). Wash Face and genitals (private parts)  with your normal soap.  6. Wash thoroughly, paying special attention to the area where your surgery will be performed.  7. Thoroughly rinse your body with warm water from the neck down.  8. DO NOT shower/wash with your normal soap after using and rinsing off the CHG Soap.  9. Pat yourself dry with a CLEAN TOWEL.  10. Wear CLEAN PAJAMAS to bed the night before surgery, wear comfortable clothes the morning of surgery  11. Place CLEAN SHEETS  on your bed the night of your first shower and DO NOT SLEEP WITH PETS.  Day of Surgery: Do not apply any deodorants/lotions. Please wear clean clothes to the hospital/surgery center.    Please read over the following fact sheets that you were given. Pain Booklet, Coughing and Deep Breathing, Total Joint Packet, MRSA Information and Surgical Site Infection Prevention

## 2017-03-14 ENCOUNTER — Other Ambulatory Visit: Payer: Self-pay

## 2017-03-14 ENCOUNTER — Encounter (HOSPITAL_COMMUNITY)
Admission: RE | Admit: 2017-03-14 | Discharge: 2017-03-14 | Disposition: A | Discharge status: Home / Self Care | Payer: Medicare Other | Source: Ambulatory Visit | Attending: Orthopedic Surgery | Admitting: Orthopedic Surgery

## 2017-03-14 ENCOUNTER — Encounter (HOSPITAL_COMMUNITY): Admission: RE | Admit: 2017-03-14 | Payer: Medicare Other | Source: Ambulatory Visit

## 2017-03-14 ENCOUNTER — Encounter (HOSPITAL_COMMUNITY): Payer: Self-pay

## 2017-03-14 DIAGNOSIS — I447 Left bundle-branch block, unspecified: Secondary | ICD-10-CM | POA: Diagnosis not present

## 2017-03-14 DIAGNOSIS — I700 Atherosclerosis of aorta: Secondary | ICD-10-CM | POA: Diagnosis not present

## 2017-03-14 DIAGNOSIS — Z01818 Encounter for other preprocedural examination: Secondary | ICD-10-CM | POA: Diagnosis not present

## 2017-03-14 DIAGNOSIS — Z01812 Encounter for preprocedural laboratory examination: Secondary | ICD-10-CM | POA: Insufficient documentation

## 2017-03-14 DIAGNOSIS — Z85528 Personal history of other malignant neoplasm of kidney: Secondary | ICD-10-CM | POA: Insufficient documentation

## 2017-03-14 DIAGNOSIS — I2510 Atherosclerotic heart disease of native coronary artery without angina pectoris: Secondary | ICD-10-CM | POA: Diagnosis not present

## 2017-03-14 DIAGNOSIS — Z905 Acquired absence of kidney: Secondary | ICD-10-CM | POA: Diagnosis not present

## 2017-03-14 DIAGNOSIS — R0602 Shortness of breath: Secondary | ICD-10-CM | POA: Diagnosis not present

## 2017-03-14 DIAGNOSIS — Z01811 Encounter for preprocedural respiratory examination: Secondary | ICD-10-CM

## 2017-03-14 DIAGNOSIS — K219 Gastro-esophageal reflux disease without esophagitis: Secondary | ICD-10-CM | POA: Diagnosis not present

## 2017-03-14 DIAGNOSIS — I10 Essential (primary) hypertension: Secondary | ICD-10-CM | POA: Insufficient documentation

## 2017-03-14 HISTORY — DX: Personal history of urinary calculi: Z87.442

## 2017-03-14 LAB — CBC
HCT: 46.7 % (ref 39.0–52.0)
Hemoglobin: 16.5 g/dL (ref 13.0–17.0)
MCH: 32.2 pg (ref 26.0–34.0)
MCHC: 35.3 g/dL (ref 30.0–36.0)
MCV: 91 fL (ref 78.0–100.0)
Platelets: 171 10*3/uL (ref 150–400)
RBC: 5.13 MIL/uL (ref 4.22–5.81)
RDW: 12.4 % (ref 11.5–15.5)
WBC: 5.1 10*3/uL (ref 4.0–10.5)

## 2017-03-14 LAB — BASIC METABOLIC PANEL
ANION GAP: 10 (ref 5–15)
BUN: 20 mg/dL (ref 6–20)
CALCIUM: 9.2 mg/dL (ref 8.9–10.3)
CO2: 23 mmol/L (ref 22–32)
Chloride: 105 mmol/L (ref 101–111)
Creatinine, Ser: 1.56 mg/dL — ABNORMAL HIGH (ref 0.61–1.24)
GFR, EST AFRICAN AMERICAN: 50 mL/min — AB (ref >60)
GFR, EST NON AFRICAN AMERICAN: 43 mL/min — AB (ref >60)
Glucose, Bld: 180 mg/dL — ABNORMAL HIGH (ref 65–99)
Potassium: 4 mmol/L (ref 3.5–5.1)
SODIUM: 138 mmol/L (ref 135–145)

## 2017-03-14 LAB — SURGICAL PCR SCREEN
MRSA, PCR: NEGATIVE
STAPHYLOCOCCUS AUREUS: POSITIVE — AB

## 2017-03-14 NOTE — Progress Notes (Addendum)
PCP  Is Dr. Threasa Beards  LOV 02/2017 Cardio is Dr. P Martinique.  Had cardiac pre op evaluation done 03/04/2017 for this surgery. Was told yrs ago he had murmur, but nothing has been mentioned in yrs. Currently denies anything cardiac going on. Was told by Dr. Martinique to stop his aspirin 7 days prior to surgery.

## 2017-03-15 NOTE — Progress Notes (Signed)
Anesthesia Chart Review:  Patient is a 71 year old male scheduled for R total hip arthroplasty on 03/26/2016 with Edmonia Lynch, M.D.  - PCP is Leanna Battles, MD - Cardiologist is Peter Martinique, MD.  Pt cleared for surgery at last office visit 03/14/17 with Bernerd Pho, [place]  PMH includes: CAD (minimal by 2011 cath), LBBB, HTN, ascending aortic aneurysm (stable at 4.5cm by 04/06/16 CT; repeat CT scheduled for 04/29/17), renal cancer (s/p R partial nephrectomy 11/01/11), GERD.  Never smoker. BMI 28  Medications include: Amlodipine, ASA 81 mg, losartan, Prilosec, potassium, pravastatin  BP 140/83   Pulse (!) 107   Temp 36.6 C (Oral)   Resp 18   Ht 5' 11.5" (1.816 m)   Wt 201 lb 4.5 oz (91.3 kg)   SpO2 95%   BMI 27.68 kg/m   Preoperative labs reviewed.    EKG 03/04/17: NSR. LAD. LBBB. Inferior infarct, age undetermined  CT angio chest 04/06/16:  1. Stable ascending thoracic aortic aneurysm measuring 4.5 cm in diameter. Ascending thoracic aortic aneurysm. Recommend semi-annual imaging followup by CTA or MRA and referral to cardiothoracic surgery if not already obtained.   Echo 08/03/13:  - Left ventricle: Abnormal septal motion. The cavity size wasnormal. Wall thickness was increased in a pattern of moderateLVH. Systolic function was normal. The estimated ejectionfraction was in the range of 50% to 55%. Doppler parameters are consistent with abnormal left ventricular relaxation (grade 1diastolic dysfunction). - Atrial septum: No defect or patent foramen ovale was identified.  Cardiac cath 12/22/09:  1. Minimal nonobstructive atherosclerotic CAD (proximal and mid RCA with 20-30% focal narrowing). 2. Normal LV function. 3. Moderate aortic root dilatation  If no changes, I anticipate pt can proceed with surgery as scheduled.   Willeen Cass, FNP-BC [facility]/Anesthesiology Phone: [phone] 03/15/2017 2:10 PM

## 2017-03-25 MED ORDER — TRANEXAMIC ACID 1000 MG/10ML IV SOLN
2000.0000 mg | Freq: Once | INTRAVENOUS | Status: DC
  Filled 2017-03-25: qty 20

## 2017-03-25 MED ORDER — TRANEXAMIC ACID 1000 MG/10ML IV SOLN
1000.0000 mg | INTRAVENOUS | Status: AC
  Administered 2017-03-26: 1000 mg via INTRAVENOUS
  Filled 2017-03-25: qty 1100

## 2017-03-26 ENCOUNTER — Observation Stay (HOSPITAL_COMMUNITY)
Admission: RE | Admit: 2017-03-26 | Discharge: 2017-03-27 | Disposition: A | Discharge status: Home / Self Care | Payer: Medicare Other | Source: Ambulatory Visit | Attending: Orthopedic Surgery | Admitting: Orthopedic Surgery

## 2017-03-26 ENCOUNTER — Other Ambulatory Visit: Payer: Self-pay | Admitting: Orthopedic Surgery

## 2017-03-26 ENCOUNTER — Inpatient Hospital Stay (HOSPITAL_COMMUNITY): Payer: Medicare Other | Admitting: Anesthesiology

## 2017-03-26 ENCOUNTER — Inpatient Hospital Stay (HOSPITAL_COMMUNITY): Payer: Medicare Other

## 2017-03-26 ENCOUNTER — Encounter (HOSPITAL_COMMUNITY)
Admission: RE | Disposition: A | Discharge status: Home / Self Care | Payer: Self-pay | Source: Ambulatory Visit | Attending: Orthopedic Surgery

## 2017-03-26 ENCOUNTER — Inpatient Hospital Stay (HOSPITAL_COMMUNITY): Payer: Medicare Other | Admitting: Emergency Medicine

## 2017-03-26 ENCOUNTER — Encounter (HOSPITAL_COMMUNITY): Payer: Self-pay | Admitting: Urology

## 2017-03-26 DIAGNOSIS — I712 Thoracic aortic aneurysm, without rupture, unspecified: Secondary | ICD-10-CM | POA: Diagnosis present

## 2017-03-26 DIAGNOSIS — M1611 Unilateral primary osteoarthritis, right hip: Principal | ICD-10-CM | POA: Diagnosis present

## 2017-03-26 DIAGNOSIS — I447 Left bundle-branch block, unspecified: Secondary | ICD-10-CM | POA: Diagnosis present

## 2017-03-26 DIAGNOSIS — Z79899 Other long term (current) drug therapy: Secondary | ICD-10-CM | POA: Insufficient documentation

## 2017-03-26 DIAGNOSIS — K219 Gastro-esophageal reflux disease without esophagitis: Secondary | ICD-10-CM | POA: Diagnosis not present

## 2017-03-26 DIAGNOSIS — I10 Essential (primary) hypertension: Secondary | ICD-10-CM

## 2017-03-26 DIAGNOSIS — I2510 Atherosclerotic heart disease of native coronary artery without angina pectoris: Secondary | ICD-10-CM | POA: Diagnosis present

## 2017-03-26 DIAGNOSIS — Z87442 Personal history of urinary calculi: Secondary | ICD-10-CM | POA: Diagnosis not present

## 2017-03-26 DIAGNOSIS — Z7901 Long term (current) use of anticoagulants: Secondary | ICD-10-CM | POA: Insufficient documentation

## 2017-03-26 DIAGNOSIS — N183 Chronic kidney disease, stage 3 unspecified: Secondary | ICD-10-CM

## 2017-03-26 DIAGNOSIS — Z96653 Presence of artificial knee joint, bilateral: Secondary | ICD-10-CM | POA: Insufficient documentation

## 2017-03-26 DIAGNOSIS — Z471 Aftercare following joint replacement surgery: Secondary | ICD-10-CM | POA: Diagnosis not present

## 2017-03-26 DIAGNOSIS — I700 Atherosclerosis of aorta: Secondary | ICD-10-CM | POA: Diagnosis not present

## 2017-03-26 DIAGNOSIS — E739 Lactose intolerance, unspecified: Secondary | ICD-10-CM | POA: Diagnosis not present

## 2017-03-26 DIAGNOSIS — I714 Abdominal aortic aneurysm, without rupture: Secondary | ICD-10-CM | POA: Insufficient documentation

## 2017-03-26 DIAGNOSIS — Z419 Encounter for procedure for purposes other than remedying health state, unspecified: Secondary | ICD-10-CM

## 2017-03-26 DIAGNOSIS — I129 Hypertensive chronic kidney disease with stage 1 through stage 4 chronic kidney disease, or unspecified chronic kidney disease: Secondary | ICD-10-CM | POA: Diagnosis not present

## 2017-03-26 DIAGNOSIS — Z96641 Presence of right artificial hip joint: Secondary | ICD-10-CM | POA: Diagnosis not present

## 2017-03-26 DIAGNOSIS — Z7982 Long term (current) use of aspirin: Secondary | ICD-10-CM | POA: Insufficient documentation

## 2017-03-26 DIAGNOSIS — N1831 Chronic kidney disease, stage 3a: Secondary | ICD-10-CM | POA: Diagnosis present

## 2017-03-26 HISTORY — PX: TOTAL HIP ARTHROPLASTY: SHX124

## 2017-03-26 SURGERY — ARTHROPLASTY, HIP, TOTAL, ANTERIOR APPROACH
Anesthesia: General | Site: Hip | Laterality: Right

## 2017-03-26 MED ORDER — TAMSULOSIN HCL 0.4 MG PO CAPS
0.4000 mg | ORAL_CAPSULE | Freq: Every day | ORAL | Status: DC
  Administered 2017-03-27: 0.4 mg via ORAL
  Filled 2017-03-26: qty 1

## 2017-03-26 MED ORDER — LOSARTAN POTASSIUM 50 MG PO TABS
100.0000 mg | ORAL_TABLET | Freq: Every day | ORAL | Status: DC
  Administered 2017-03-27: 100 mg via ORAL
  Filled 2017-03-26: qty 2

## 2017-03-26 MED ORDER — OXYCODONE HCL 5 MG PO TABS: 5.0000 mg | ORAL_TABLET | Freq: Once | ORAL | Status: DC | PRN

## 2017-03-26 MED ORDER — OXYCODONE HCL 5 MG/5ML PO SOLN: 5.0000 mg | Freq: Once | ORAL | Status: DC | PRN

## 2017-03-26 MED ORDER — HYDROCODONE-ACETAMINOPHEN 5-325 MG PO TABS
ORAL_TABLET | ORAL | Status: AC
  Filled 2017-03-26: qty 2

## 2017-03-26 MED ORDER — ONDANSETRON HCL 4 MG PO TABS
4.0000 mg | ORAL_TABLET | Freq: Four times a day (QID) | ORAL | Status: DC | PRN
  Administered 2017-03-27: 4 mg via ORAL
  Filled 2017-03-26: qty 1

## 2017-03-26 MED ORDER — ONDANSETRON HCL 4 MG/2ML IJ SOLN
INTRAMUSCULAR | Status: DC | PRN
  Administered 2017-03-26 (×2): 4 mg via INTRAVENOUS

## 2017-03-26 MED ORDER — SORBITOL 70 % SOLN
30.0000 mL | Freq: Every day | Status: DC | PRN
Start: 2017-03-26 — End: 2017-03-27
  Filled 2017-03-26: qty 30

## 2017-03-26 MED ORDER — DEXAMETHASONE SODIUM PHOSPHATE 10 MG/ML IJ SOLN
10.0000 mg | Freq: Once | INTRAMUSCULAR | Status: AC
  Administered 2017-03-27: 10 mg via INTRAVENOUS
  Filled 2017-03-26: qty 1

## 2017-03-26 MED ORDER — AMLODIPINE BESYLATE 5 MG PO TABS
5.0000 mg | ORAL_TABLET | Freq: Every day | ORAL | Status: DC
  Administered 2017-03-27: 5 mg via ORAL
  Filled 2017-03-26: qty 1

## 2017-03-26 MED ORDER — HYDROCODONE-ACETAMINOPHEN 5-325 MG PO TABS
2.0000 | ORAL_TABLET | ORAL | Status: DC | PRN
  Administered 2017-03-26 – 2017-03-27 (×4): 2 via ORAL
  Filled 2017-03-26 (×3): qty 2

## 2017-03-26 MED ORDER — ONDANSETRON HCL 4 MG/2ML IJ SOLN: 4.0000 mg | Freq: Four times a day (QID) | INTRAMUSCULAR | Status: DC | PRN

## 2017-03-26 MED ORDER — FLEET ENEMA 7-19 GM/118ML RE ENEM: 1.0000 | ENEMA | Freq: Once | RECTAL | Status: DC | PRN

## 2017-03-26 MED ORDER — ACETAMINOPHEN 325 MG PO TABS: 650.0000 mg | ORAL_TABLET | ORAL | Status: DC | PRN

## 2017-03-26 MED ORDER — FENTANYL CITRATE (PF) 100 MCG/2ML IJ SOLN
INTRAMUSCULAR | Status: AC
  Administered 2017-03-26: 50 ug via INTRAVENOUS
  Filled 2017-03-26: qty 2

## 2017-03-26 MED ORDER — POTASSIUM CITRATE ER 10 MEQ (1080 MG) PO TBCR: 20.0000 meq | EXTENDED_RELEASE_TABLET | Freq: Two times a day (BID) | ORAL | Status: DC

## 2017-03-26 MED ORDER — METHOCARBAMOL 1000 MG/10ML IJ SOLN
500.0000 mg | Freq: Four times a day (QID) | INTRAVENOUS | Status: DC | PRN
  Filled 2017-03-26: qty 5

## 2017-03-26 MED ORDER — ROCURONIUM BROMIDE 100 MG/10ML IV SOLN
INTRAVENOUS | Status: DC | PRN
  Administered 2017-03-26: 70 mg via INTRAVENOUS

## 2017-03-26 MED ORDER — LACTATED RINGERS IV SOLN
INTRAVENOUS | Status: DC
  Administered 2017-03-26 – 2017-03-27 (×2): via INTRAVENOUS

## 2017-03-26 MED ORDER — DEXAMETHASONE SODIUM PHOSPHATE 10 MG/ML IJ SOLN
INTRAMUSCULAR | Status: DC | PRN
  Administered 2017-03-26: 4 mg via INTRAVENOUS

## 2017-03-26 MED ORDER — DEXAMETHASONE SODIUM PHOSPHATE 10 MG/ML IJ SOLN
INTRAMUSCULAR | Status: AC
  Filled 2017-03-26: qty 1

## 2017-03-26 MED ORDER — ONDANSETRON HCL 4 MG/2ML IJ SOLN
INTRAMUSCULAR | Status: AC
  Filled 2017-03-26: qty 2

## 2017-03-26 MED ORDER — METOCLOPRAMIDE HCL 5 MG/ML IJ SOLN: 5.0000 mg | Freq: Three times a day (TID) | INTRAMUSCULAR | Status: DC | PRN

## 2017-03-26 MED ORDER — BACLOFEN 10 MG PO TABS: 10.0000 mg | ORAL_TABLET | Freq: Three times a day (TID) | ORAL | 0 refills | Status: DC | PRN

## 2017-03-26 MED ORDER — MENTHOL 3 MG MT LOZG: 1.0000 | LOZENGE | OROMUCOSAL | Status: DC | PRN

## 2017-03-26 MED ORDER — GABAPENTIN 300 MG PO CAPS
300.0000 mg | ORAL_CAPSULE | Freq: Once | ORAL | Status: AC
  Administered 2017-03-26: 300 mg via ORAL
  Filled 2017-03-26: qty 1

## 2017-03-26 MED ORDER — LIDOCAINE HCL (CARDIAC) 20 MG/ML IV SOLN
INTRAVENOUS | Status: DC | PRN
  Administered 2017-03-26: 100 mg via INTRATRACHEAL

## 2017-03-26 MED ORDER — BUPIVACAINE LIPOSOME 1.3 % IJ SUSP
INTRAMUSCULAR | Status: DC | PRN
  Administered 2017-03-26: 20 mL

## 2017-03-26 MED ORDER — PHENOL 1.4 % MT LIQD: 1.0000 | OROMUCOSAL | Status: DC | PRN

## 2017-03-26 MED ORDER — MIDAZOLAM HCL 5 MG/5ML IJ SOLN
INTRAMUSCULAR | Status: DC | PRN
  Administered 2017-03-26: 1 mg via INTRAVENOUS

## 2017-03-26 MED ORDER — HYDROCODONE-ACETAMINOPHEN 5-325 MG PO TABS: 1.0000 | ORAL_TABLET | ORAL | 0 refills | Status: DC | PRN

## 2017-03-26 MED ORDER — PRAVASTATIN SODIUM 40 MG PO TABS
40.0000 mg | ORAL_TABLET | Freq: Every day | ORAL | Status: DC
  Administered 2017-03-27: 40 mg via ORAL
  Filled 2017-03-26: qty 1

## 2017-03-26 MED ORDER — ACETAMINOPHEN 650 MG RE SUPP: 650.0000 mg | RECTAL | Status: DC | PRN

## 2017-03-26 MED ORDER — FENTANYL CITRATE (PF) 100 MCG/2ML IJ SOLN
25.0000 ug | INTRAMUSCULAR | Status: DC | PRN
  Administered 2017-03-26 (×2): 50 ug via INTRAVENOUS

## 2017-03-26 MED ORDER — METOCLOPRAMIDE HCL 5 MG PO TABS
5.0000 mg | ORAL_TABLET | Freq: Three times a day (TID) | ORAL | Status: DC | PRN
  Administered 2017-03-27: 10 mg via ORAL
  Filled 2017-03-26: qty 2

## 2017-03-26 MED ORDER — SODIUM CHLORIDE 0.9 % IV SOLN
INTRAVENOUS | Status: AC | PRN
  Administered 2017-03-26: 2000 mg via TOPICAL

## 2017-03-26 MED ORDER — PANTOPRAZOLE SODIUM 40 MG PO TBEC
40.0000 mg | DELAYED_RELEASE_TABLET | Freq: Every day | ORAL | Status: DC
Start: 2017-03-27 — End: 2017-03-27
  Administered 2017-03-27: 40 mg via ORAL
  Filled 2017-03-26: qty 1

## 2017-03-26 MED ORDER — CEFAZOLIN SODIUM-DEXTROSE 1-4 GM/50ML-% IV SOLN
1.0000 g | Freq: Four times a day (QID) | INTRAVENOUS | Status: AC
  Administered 2017-03-26 – 2017-03-27 (×2): 1 g via INTRAVENOUS
  Filled 2017-03-26 (×2): qty 50

## 2017-03-26 MED ORDER — 0.9 % SODIUM CHLORIDE (POUR BTL) OPTIME
TOPICAL | Status: DC | PRN
  Administered 2017-03-26: 1000 mL

## 2017-03-26 MED ORDER — FENTANYL CITRATE (PF) 250 MCG/5ML IJ SOLN
INTRAMUSCULAR | Status: DC | PRN
  Administered 2017-03-26 (×2): 50 ug via INTRAVENOUS
  Administered 2017-03-26: 100 ug via INTRAVENOUS

## 2017-03-26 MED ORDER — POLYETHYLENE GLYCOL 3350 17 G PO PACK: 17.0000 g | PACK | Freq: Every day | ORAL | Status: DC | PRN

## 2017-03-26 MED ORDER — DOCUSATE SODIUM 100 MG PO CAPS: 100.0000 mg | ORAL_CAPSULE | Freq: Two times a day (BID) | ORAL | 0 refills | Status: DC

## 2017-03-26 MED ORDER — METHOCARBAMOL 500 MG PO TABS
ORAL_TABLET | ORAL | Status: AC
  Filled 2017-03-26: qty 1

## 2017-03-26 MED ORDER — DOCUSATE SODIUM 100 MG PO CAPS
100.0000 mg | ORAL_CAPSULE | Freq: Two times a day (BID) | ORAL | Status: DC
  Administered 2017-03-26 – 2017-03-27 (×2): 100 mg via ORAL
  Filled 2017-03-26 (×2): qty 1

## 2017-03-26 MED ORDER — SUGAMMADEX SODIUM 200 MG/2ML IV SOLN
INTRAVENOUS | Status: DC | PRN
  Administered 2017-03-26: 200 mg via INTRAVENOUS

## 2017-03-26 MED ORDER — LACTATED RINGERS IV SOLN
INTRAVENOUS | Status: DC
  Administered 2017-03-26 (×2): via INTRAVENOUS

## 2017-03-26 MED ORDER — FENTANYL CITRATE (PF) 250 MCG/5ML IJ SOLN
INTRAMUSCULAR | Status: AC
  Filled 2017-03-26: qty 5

## 2017-03-26 MED ORDER — CHLORHEXIDINE GLUCONATE 4 % EX LIQD: 60.0000 mL | Freq: Once | CUTANEOUS | Status: DC

## 2017-03-26 MED ORDER — HYDROCODONE-ACETAMINOPHEN 5-325 MG PO TABS: 1.0000 | ORAL_TABLET | ORAL | Status: DC | PRN

## 2017-03-26 MED ORDER — SODIUM CHLORIDE FLUSH 0.9 % IV SOLN
INTRAVENOUS | Status: DC | PRN
  Administered 2017-03-26: 20 mL
  Administered 2017-03-26: 30 mL

## 2017-03-26 MED ORDER — ALPRAZOLAM 0.25 MG PO TABS
0.2500 mg | ORAL_TABLET | Freq: Every day | ORAL | Status: DC
Start: 2017-03-26 — End: 2017-03-27
  Administered 2017-03-26: 0.25 mg via ORAL
  Filled 2017-03-26: qty 1

## 2017-03-26 MED ORDER — MIDAZOLAM HCL 2 MG/2ML IJ SOLN
INTRAMUSCULAR | Status: AC
  Filled 2017-03-26: qty 2

## 2017-03-26 MED ORDER — DIPHENHYDRAMINE HCL 12.5 MG/5ML PO ELIX: 12.5000 mg | ORAL_SOLUTION | ORAL | Status: DC | PRN

## 2017-03-26 MED ORDER — KETOROLAC TROMETHAMINE 15 MG/ML IJ SOLN: 7.5000 mg | Freq: Four times a day (QID) | INTRAMUSCULAR | Status: DC

## 2017-03-26 MED ORDER — METHOCARBAMOL 500 MG PO TABS
500.0000 mg | ORAL_TABLET | Freq: Four times a day (QID) | ORAL | Status: DC | PRN
  Administered 2017-03-26 – 2017-03-27 (×2): 500 mg via ORAL
  Filled 2017-03-26: qty 1

## 2017-03-26 MED ORDER — RIVAROXABAN 10 MG PO TABS
10.0000 mg | ORAL_TABLET | Freq: Every day | ORAL | Status: DC
  Administered 2017-03-27: 10 mg via ORAL
  Filled 2017-03-26 (×2): qty 1

## 2017-03-26 MED ORDER — ACETAMINOPHEN 500 MG PO TABS
1000.0000 mg | ORAL_TABLET | Freq: Once | ORAL | Status: AC
  Administered 2017-03-26: 1000 mg via ORAL
  Filled 2017-03-26: qty 2

## 2017-03-26 MED ORDER — ONDANSETRON HCL 4 MG PO TABS: 4.0000 mg | ORAL_TABLET | Freq: Three times a day (TID) | ORAL | 0 refills | Status: DC | PRN

## 2017-03-26 MED ORDER — CEFAZOLIN SODIUM-DEXTROSE 2-4 GM/100ML-% IV SOLN
2.0000 g | INTRAVENOUS | Status: AC
  Administered 2017-03-26: 2 g via INTRAVENOUS
  Filled 2017-03-26: qty 100

## 2017-03-26 MED ORDER — RIVAROXABAN 10 MG PO TABS: 10.0000 mg | ORAL_TABLET | Freq: Every day | ORAL | 0 refills | Status: DC

## 2017-03-26 MED ORDER — BUPIVACAINE LIPOSOME 1.3 % IJ SUSP
20.0000 mL | INTRAMUSCULAR | Status: AC
  Filled 2017-03-26: qty 20

## 2017-03-26 MED ORDER — SENNA 8.6 MG PO TABS
1.0000 | ORAL_TABLET | Freq: Two times a day (BID) | ORAL | Status: DC
  Administered 2017-03-26 – 2017-03-27 (×2): 8.6 mg via ORAL
  Filled 2017-03-26 (×2): qty 1

## 2017-03-26 MED ORDER — HYDROMORPHONE HCL 1 MG/ML IJ SOLN
0.5000 mg | INTRAMUSCULAR | Status: DC | PRN
  Administered 2017-03-26 – 2017-03-27 (×2): 0.5 mg via INTRAVENOUS
  Filled 2017-03-26 (×3): qty 1

## 2017-03-26 MED ORDER — SUGAMMADEX SODIUM 200 MG/2ML IV SOLN
INTRAVENOUS | Status: AC
  Filled 2017-03-26: qty 2

## 2017-03-26 SURGICAL SUPPLY — 48 items
BAG DECANTER FOR FLEXI CONT (MISCELLANEOUS) ×3 IMPLANT
BLADE SAG 18X100X1.27 (BLADE) ×3 IMPLANT
CAPT HIP TOTAL 3 ×3 IMPLANT
CLOSURE STERI-STRIP 1/2X4 (GAUZE/BANDAGES/DRESSINGS) ×1
CLSR STERI-STRIP ANTIMIC 1/2X4 (GAUZE/BANDAGES/DRESSINGS) ×2 IMPLANT
COVER PERINEAL POST (MISCELLANEOUS) ×3 IMPLANT
COVER SURGICAL LIGHT HANDLE (MISCELLANEOUS) ×3 IMPLANT
DRAPE C-ARM 42X72 X-RAY (DRAPES) ×3 IMPLANT
DRAPE STERI IOBAN 125X83 (DRAPES) ×3 IMPLANT
DRAPE U-SHAPE 47X51 STRL (DRAPES) IMPLANT
DRSG MEPILEX BORDER 4X8 (GAUZE/BANDAGES/DRESSINGS) ×3 IMPLANT
DURAPREP 26ML APPLICATOR (WOUND CARE) ×3 IMPLANT
ELECT BLADE 4.0 EZ CLEAN MEGAD (MISCELLANEOUS) ×3
ELECT REM PT RETURN 9FT ADLT (ELECTROSURGICAL) ×3
ELECTRODE BLDE 4.0 EZ CLN MEGD (MISCELLANEOUS) ×1 IMPLANT
ELECTRODE REM PT RTRN 9FT ADLT (ELECTROSURGICAL) ×1 IMPLANT
FACESHIELD WRAPAROUND (MASK) ×6 IMPLANT
GLOVE BIO SURGEON STRL SZ7.5 (GLOVE) ×9 IMPLANT
GLOVE BIOGEL PI IND STRL 8 (GLOVE) ×2 IMPLANT
GLOVE BIOGEL PI INDICATOR 8 (GLOVE) ×4
GOWN STRL REUS W/ TWL LRG LVL3 (GOWN DISPOSABLE) ×2 IMPLANT
GOWN STRL REUS W/TWL LRG LVL3 (GOWN DISPOSABLE) ×6
KIT BASIN OR (CUSTOM PROCEDURE TRAY) ×3 IMPLANT
KIT ROOM TURNOVER OR (KITS) ×3 IMPLANT
MANIFOLD NEPTUNE II (INSTRUMENTS) ×3 IMPLANT
NDL SAFETY ECLIPSE 18X1.5 (NEEDLE) IMPLANT
NEEDLE HYPO 18GX1.5 SHARP (NEEDLE)
NEEDLE HYPO 22GX1.5 SAFETY (NEEDLE) ×3 IMPLANT
NS IRRIG 1000ML POUR BTL (IV SOLUTION) ×3 IMPLANT
PACK TOTAL JOINT (CUSTOM PROCEDURE TRAY) ×3 IMPLANT
PAD ARMBOARD 7.5X6 YLW CONV (MISCELLANEOUS) ×3 IMPLANT
SPONGE LAP 18X18 X RAY DECT (DISPOSABLE) IMPLANT
SUT MNCRL AB 4-0 PS2 18 (SUTURE) ×3 IMPLANT
SUT MON AB 2-0 CT1 36 (SUTURE) ×3 IMPLANT
SUT VIC AB 0 CT1 27 (SUTURE) ×3
SUT VIC AB 0 CT1 27XBRD ANBCTR (SUTURE) ×1 IMPLANT
SUT VIC AB 1 CT1 27 (SUTURE) ×3
SUT VIC AB 1 CT1 27XBRD ANBCTR (SUTURE) ×1 IMPLANT
SUT VLOC 180 0 24IN GS25 (SUTURE) ×3 IMPLANT
SYR 50ML LL SCALE MARK (SYRINGE) ×3 IMPLANT
SYR BULB IRRIGATION 50ML (SYRINGE) ×3 IMPLANT
SYRINGE 20CC LL (MISCELLANEOUS) IMPLANT
TOWEL OR 17X24 6PK STRL BLUE (TOWEL DISPOSABLE) ×3 IMPLANT
TOWEL OR 17X26 10 PK STRL BLUE (TOWEL DISPOSABLE) ×3 IMPLANT
TRAY CATH 16FR W/PLASTIC CATH (SET/KITS/TRAYS/PACK) IMPLANT
TRAY FOLEY W/METER SILVER 16FR (SET/KITS/TRAYS/PACK) IMPLANT
WATER STERILE IRR 1000ML POUR (IV SOLUTION) ×3 IMPLANT
YANKAUER SUCT BULB TIP NO VENT (SUCTIONS) ×6 IMPLANT

## 2017-03-26 NOTE — Transfer of Care (Signed)
Immediate Anesthesia Transfer of Care Note  Patient: Corey [name]  Procedure(s) Performed: RIGHT TOTAL HIP ARTHROPLASTY ANTERIOR APPROACH (Right Hip)  Patient Location: PACU  Anesthesia Type:General  Level of Consciousness: awake and alert   Airway & Oxygen Therapy: Patient Spontanous Breathing and Patient connected to nasal cannula oxygen  Post-op Assessment: Report given to RN and Post -op Vital signs reviewed and stable  Post vital signs: Reviewed and stable  Last Vitals:  Vitals:   03/26/17 1221  BP: (!) 160/88  Pulse: 83  Resp: 18  Temp: 36.5 C  SpO2: 99%    Last Pain:  Vitals:   03/26/17 1749  TempSrc:   PainSc: (P) 0-No pain         Complications: No apparent anesthesia complications

## 2017-03-26 NOTE — Anesthesia Procedure Notes (Addendum)
Procedure Name: Intubation Date/Time: 03/26/2017 4:01 PM Performed by: Mariea Clonts, CRNA Pre-anesthesia Checklist: Patient identified, Emergency Drugs available, Suction available and Patient being monitored Patient Re-evaluated:Patient Re-evaluated prior to induction Oxygen Delivery Method: Circle System Utilized Preoxygenation: Pre-oxygenation with 100% oxygen Induction Type: IV induction Ventilation: Mask ventilation without difficulty Laryngoscope Size: Miller and 3 Grade View: Grade I Tube type: Oral Tube size: 7.5 mm Number of attempts: 1 Airway Equipment and Method: Stylet and Oral airway Placement Confirmation: ETT inserted through vocal cords under direct vision,  positive ETCO2 and breath sounds checked- equal and bilateral Secured at: 22 cm Tube secured with: Tape Dental Injury: Teeth and Oropharynx as per pre-operative assessment

## 2017-03-26 NOTE — Interval H&P Note (Signed)
History and Physical Interval Note:  03/26/2017 2:52 PM  Corey [name]  has presented today for surgery, with the diagnosis of DJD RIGHT HIP  The various methods of treatment have been discussed with the patient and family. After consideration of risks, benefits and other options for treatment, the patient has consented to  Procedure(s): TOTAL HIP ARTHROPLASTY ANTERIOR APPROACH (Right) as a surgical intervention .  The patient's history has been reviewed, patient examined, no change in status, stable for surgery.  I have reviewed the patient's chart and labs.  Questions were answered to the patient's satisfaction.     Sakshi Sermons D

## 2017-03-26 NOTE — Op Note (Signed)
03/26/2017  4:56 PM  PATIENT:  Corey [name]   MRN: [id number]  PRE-OPERATIVE DIAGNOSIS:  DJD RIGHT HIP  POST-OPERATIVE DIAGNOSIS:  DJD RIGHT HIP  PROCEDURE:  Procedure(s): RIGHT TOTAL HIP ARTHROPLASTY ANTERIOR APPROACH  PREOPERATIVE INDICATIONS:    Corey [name] is an 72 y.o. male who has a diagnosis of Primary osteoarthritis of right hip and elected for surgical management after failing conservative treatment.  The risks benefits and alternatives were discussed with the patient including but not limited to the risks of nonoperative treatment, versus surgical intervention including infection, bleeding, nerve injury, periprosthetic fracture, the need for revision surgery, dislocation, leg length discrepancy, blood clots, cardiopulmonary complications, morbidity, mortality, among others, and they were willing to proceed.     OPERATIVE REPORT     SURGEON:   Isaid Salvia, Ernesta Amble, MD    ASSISTANT:  Roxan Hockey, PA-C, he was present and scrubbed throughout the case, critical for completion in a timely fashion, and for retraction, instrumentation, and closure.     ANESTHESIA:  General    COMPLICATIONS:  None.     COMPONENTS:  [employer]ker acolade fit femur size 6 with a 36 mm -0 head ball and a PSL acetabular shell size 54 with a  polyethylene liner    PROCEDURE IN DETAIL:   The patient was met in the holding area and  identified.  The appropriate hip was identified and marked at the operative site.  The patient was then transported to the OR  and  placed under anesthesia per that record.  At that point, the patient was  placed in the supine position and  secured to the operating room table and all bony prominences padded. He received pre-operative antibiotics    The operative lower extremity was prepped from the iliac crest to the distal leg.  Sterile draping was performed.  Time out was performed prior to incision.      Skin incision was made just 2 cm lateral to the ASIS  extending  in line with the tensor fascia lata. Electrocautery was used to control all bleeders. I dissected down sharply to the fascia of the tensor fascia lata was confirmed that the muscle fibers beneath were running posteriorly. I then incised the fascia over the superficial tensor fascia lata in line with the incision. The fascia was elevated off the anterior aspect of the muscle the muscle was retracted posteriorly and protected throughout the case. I then used electrocautery to incise the tensor fascia lata fascia control and all bleeders. Immediately visible was the fat over top of the anterior neck and capsule.  I removed the anterior fat from the capsule and elevated the rectus muscle off of the anterior capsule. I then removed a large time of capsule. The retractors were then placed over the anterior acetabulum as well as around the superior and inferior neck.  I then removed a section of the femoral neck and a napkin ring fashion. Then used the power course to remove the femoral head from the acetabulum and thoroughly irrigated the acetabulum. I sized the femoral head.    I then exposed the deep acetabulum, cleared out any tissue including the ligamentum teres.   After adequate visualization, I excised the labrum, and then sequentially reamed.  I then impacted the acetabular implant into place using fluoroscopy for guidance.  Appropriate version and inclination was confirmed clinically matching their bony anatomy, and with fluoroscopy.  I placed a 20 mm screw in the posterior/superio position with an excellent bite.  I then placed the polyethylene liner in place  I then adducted the leg and released the external rotators from the posterior femur allowing it to be easily delivered up lateral and anterior to the acetabulum for preparation of the femoral canal.    I then prepared the proximal femur using the cookie-cutter and then sequentially reamed and broached.  A trial broach, neck, and head was  utilized, and I reduced the hip and used floroscopy to assess the neck length and femoral implant.  I then impacted the femoral prosthesis into place into the appropriate version. The hip was then reduced and fluoroscopy confirmed appropriate position. Leg lengths were restored.  I then irrigated the hip copiously again with, and repaired the fascia with Vicryl, followed by monocryl for the subcutaneous tissue, Monocryl for the skin, Steri-Strips and sterile gauze. The patient was then awakened and returned to PACU in stable and satisfactory condition. There were no complications.  POST OPERATIVE PLAN: WBAT, DVT px: SCD's/TED, ambulation and chemical dvt px  Kisean Rollo, MD Orthopedic Surgeon [phone]     

## 2017-03-26 NOTE — Interval H&P Note (Signed)
History and Physical Interval Note:  03/26/2017 3:12 PM  Corey [name]  has presented today for surgery, with the diagnosis of DJD RIGHT HIP  The various methods of treatment have been discussed with the patient and family. After consideration of risks, benefits and other options for treatment, the patient has consented to  Procedure(s): TOTAL HIP ARTHROPLASTY ANTERIOR APPROACH (Right) as a surgical intervention .  The patient's history has been reviewed, patient examined, no change in status, stable for surgery.  I have reviewed the patient's chart and labs.  Questions were answered to the patient's satisfaction.     Catalaya Garr D

## 2017-03-26 NOTE — Discharge Instructions (Signed)

## 2017-03-26 NOTE — Anesthesia Preprocedure Evaluation (Addendum)
Anesthesia Evaluation  Patient identified by MRN, date of birth, ID band Patient awake    Reviewed: Allergy & Precautions, H&P , NPO status , Patient's Chart, lab work & pertinent test results  Airway Mallampati: II   Neck ROM: full    Dental   Pulmonary shortness of breath,    breath sounds clear to auscultation       Cardiovascular hypertension, + CAD and + Peripheral Vascular Disease  + dysrhythmias  Rhythm:regular Rate:Normal  LBBB. Mild CAD.   Neuro/Psych    GI/Hepatic GERD  ,  Endo/Other    Renal/GU      Musculoskeletal  (+) Arthritis ,   Abdominal   Peds  Hematology   Anesthesia Other Findings   Reproductive/Obstetrics                             Anesthesia Physical Anesthesia Plan  ASA: III  Anesthesia Plan: General   Post-op Pain Management:    Induction: Intravenous  PONV Risk Score and Plan: 2 and Ondansetron, Treatment may vary due to age or medical condition and Midazolam  Airway Management Planned: Oral ETT  Additional Equipment:   Intra-op Plan:   Post-operative Plan: Extubation in OR  Informed Consent: I have reviewed the patients History and Physical, chart, labs and discussed the procedure including the risks, benefits and alternatives for the proposed anesthesia with the patient or authorized representative who has indicated his/her understanding and acceptance.     Plan Discussed with: CRNA, Anesthesiologist and Surgeon  Anesthesia Plan Comments:        Anesthesia Quick Evaluation

## 2017-03-27 ENCOUNTER — Encounter (HOSPITAL_COMMUNITY): Payer: Self-pay | Admitting: Orthopedic Surgery

## 2017-03-27 ENCOUNTER — Other Ambulatory Visit: Payer: Self-pay

## 2017-03-27 DIAGNOSIS — M1611 Unilateral primary osteoarthritis, right hip: Secondary | ICD-10-CM | POA: Diagnosis not present

## 2017-03-27 NOTE — Discharge Summary (Signed)
Discharge Summary  Patient ID: DREVION OFFORD MRN: [id number] DOB/AGE: 72-Oct-1947 72 y.o.  Admit date: 03/26/2017 Discharge date: 03/27/2017  Admission Diagnoses:  Primary osteoarthritis of right hip  Discharge Diagnoses:  Principal Problem:   Primary osteoarthritis of right hip Active Problems:   LACTOSE INTOLERANCE   Essential hypertension   LBBB (left bundle branch block)   Aneurysm of thoracic aorta (HCC)   G E R D   Coronary artery disease   CKD (chronic kidney disease) stage 3, GFR 30-59 ml/min (HCC)   Past Medical History:  Diagnosis Date  . Arthritis 10-22-11   hx. osteoarthritis-knees  . Ascending aortic aneurysm [facility])    Last CT in July 2012  . Cancer ([place])    right kidney  . Coronary artery disease    minimal per cath in 2011  . Dyspnea   . GERD (gastroesophageal reflux disease)   . Heart murmur   . History of kidney stones    "YRS AGO"  nothing in last 10 yrs  . Hypertension   . Lactose intolerance    used to be, not anymore per patient.  . Left bundle branch block   . Nephrolithiasis     Surgeries: Procedure(s): RIGHT TOTAL HIP ARTHROPLASTY ANTERIOR APPROACH on 03/26/2017   Consultants (if any):   Discharged Condition: Improved  Hospital Course: SHANNEN VERNON is an 72 y.o. male who was admitted 03/26/2017 with a diagnosis of Primary osteoarthritis of right hip and went to the operating room on 03/26/2017 and underwent the above named procedures.    He was given perioperative antibiotics:  Anti-infectives (From admission, onward)   Start     Dose/Rate Route Frequency Ordered Stop   03/26/17 2300  ceFAZolin (ANCEF) IVPB 1 g/50 mL premix     1 g 100 mL/hr over 30 Minutes Intravenous Every 6 hours 03/26/17 2148 03/27/17 0615   03/26/17 1215  ceFAZolin (ANCEF) IVPB 2g/100 mL premix     2 g 200 mL/hr over 30 Minutes Intravenous On call to O.R. 03/26/17 1213 03/26/17 1603    .  He was given sequential compression devices, early ambulation, and Xarelto for  DVT prophylaxis.  He benefited maximally from the hospital stay and there were no complications.    Recent vital signs:  Vitals:   03/27/17 0239 03/27/17 0510  BP: (!) 141/87 138/70  Pulse: (!) 103 97  Resp: 17 17  Temp: 98.6 F (37 C) 97.9 F (36.6 C)  SpO2: 94% 95%    Recent laboratory studies:  Lab Results  Component Value Date   HGB 16.5 03/14/2017   HGB 16.3 10/31/2014   HGB 15.3 12/26/2013   Lab Results  Component Value Date   WBC 5.1 03/14/2017   PLT 171 03/14/2017   Lab Results  Component Value Date   INR 1.4 09/04/2006   Lab Results  Component Value Date   NA 138 03/14/2017   K 4.0 03/14/2017   CL 105 03/14/2017   CO2 23 03/14/2017   BUN 20 03/14/2017   CREATININE 1.56 (H) 03/14/2017   GLUCOSE 180 (H) 03/14/2017    Discharge Medications:   Allergies as of 03/27/2017      Reactions   Ketorolac Shortness Of Breath      Medication List    STOP taking these medications   aspirin EC 81 MG tablet     TAKE these medications   ALPRAZolam 0.25 MG tablet Commonly known as:  XANAX Take 0.25 mg by mouth at bedtime.  amLODipine 5 MG tablet Commonly known as:  NORVASC Take 1 tablet (5 mg total) by mouth daily.   baclofen 10 MG tablet Commonly known as:  LIORESAL Take 1 tablet (10 mg total) by mouth 3 (three) times daily as needed for muscle spasms.   diclofenac 25 MG EC tablet Commonly known as:  VOLTAREN Take 25 mg by mouth daily.   diphenhydrAMINE 25 mg capsule Commonly known as:  BENADRYL Take 50 mg by mouth at bedtime.   docusate sodium 100 MG capsule Commonly known as:  COLACE Take 1 capsule (100 mg total) by mouth 2 (two) times daily. To prevent constipation while taking pain medication.   HYDROcodone-acetaminophen 5-325 MG tablet Commonly known as:  NORCO Take 1-2 tablets by mouth every 4 (four) hours as needed for moderate pain.   losartan 100 MG tablet Commonly known as:  COZAAR Take 1 tablet (100 mg total) by mouth daily.    Melatonin 5 MG Caps Take 10 mg by mouth at bedtime.   omeprazole 20 MG capsule Commonly known as:  PRILOSEC Take 20 mg by mouth every morning.   ondansetron 4 MG tablet Commonly known as:  ZOFRAN Take 1 tablet (4 mg total) by mouth every 8 (eight) hours as needed for nausea or vomiting.   potassium citrate 10 MEQ (1080 MG) SR tablet Commonly known as:  UROCIT-K Take 20 mEq by mouth 2 (two) times daily.   pravastatin 40 MG tablet Commonly known as:  PRAVACHOL Take 40 mg by mouth daily.   rivaroxaban 10 MG Tabs tablet Commonly known as:  XARELTO Take 1 tablet (10 mg total) by mouth daily. For 30 days for DVT prophylaxis   tamsulosin 0.4 MG Caps capsule Commonly known as:  FLOMAX Take 0.4 mg by mouth daily.       Diagnostic Studies: Dg Chest 2 View  Result Date: 03/14/2017 CLINICAL DATA:  Preoperative examination prior to hip arthroplasty. The patient reports exertional shortness of breath when outside. EXAM: CHEST  2 VIEW COMPARISON:  Chest x-ray of February 02, 2016 FINDINGS: The lungs are well-expanded and clear. The heart and pulmonary vascularity are normal. There is faint calcification in the wall of the aortic arch. There is no pleural effusion. The mediastinum is normal in width. Nipple shadows are visible bilaterally. The bony thorax exhibits no acute abnormality. IMPRESSION: There is no active cardiopulmonary disease. Thoracic aortic atherosclerosis. Electronically Signed   By: David  Martinique M.D.   On: 03/14/2017 14:40   Dg C-arm 1-60 Min  Result Date: 03/26/2017 CLINICAL DATA:  Right hip replacement EXAM: OPERATIVE RIGHT HIP (WITH PELVIS IF PERFORMED) 1 VIEWS TECHNIQUE: Fluoroscopic spot image(s) were submitted for interpretation post-operatively. COMPARISON:  None. FINDINGS: AP fluoroscopy showing a total right hip arthroplasty that is well seated. No evidence of periprosthetic fracture. IMPRESSION: Fluoroscopy for right hip arthroplasty.  No unexpected finding.  Electronically Signed   By: Monte Fantasia M.D.   On: 03/26/2017 19:06   Dg Hip Operative Unilat With Pelvis Right  Result Date: 03/26/2017 CLINICAL DATA:  Right hip replacement EXAM: OPERATIVE RIGHT HIP (WITH PELVIS IF PERFORMED) 1 VIEWS TECHNIQUE: Fluoroscopic spot image(s) were submitted for interpretation post-operatively. COMPARISON:  None. FINDINGS: AP fluoroscopy showing a total right hip arthroplasty that is well seated. No evidence of periprosthetic fracture. IMPRESSION: Fluoroscopy for right hip arthroplasty.  No unexpected finding. Electronically Signed   By: Monte Fantasia M.D.   On: 03/26/2017 19:06    Disposition: 01-Home or Self Care  Discharge Instructions  Discharge patient   Complete by:  As directed    After therapy sessions today.  Okay to Discharge IF: Cleared by PT, pain & nausea controlled PO, ambulating and tolerating diet.   Discharge disposition:  01-Home or Self Care   Discharge patient date:  03/27/2017      Follow-up Information    Renette Butters, MD.   Specialty:  Orthopedic Surgery Contact information: [address] [place] [address] [phone]            Signed: Prudencio Burly III PA-C 03/27/2017, 8:30 AM

## 2017-03-27 NOTE — Evaluation (Signed)
Physical Therapy Evaluation Patient Details Name: Corey [name] MRN: [id number] DOB: 04-12-1945 Today's Date: 03/27/2017   History of Present Illness  72 y.o. male s/p R THA. PMH includes: LBBB, HTN, GERD, CAD, Renal CA, Ascending aortic aneurysm, arthritis,  lumbar spine surgery, B TKA, elbow surgery, partial nephrectomy.     Clinical Impression  Patient is s/p above surgery resulting in functional limitations due to the deficits listed below (see PT Problem List). PTA, pt was mod I with all mobility and active. Patient lives with wife who is avail 24/7 to support in 2 story home with stairs to enter. Upon evaluation, pt presents with mild post op pain and weakness, and nausea that are limiting his mobility at this time. Progressed to supervision level ambulation with RW in hallway this session. Patient agreeable to work with therapy this afternoon to focus on stair training in preparation for safe return home today when medically cleared for d/c. Patient will benefit from skilled PT to increase their independence and safety with mobility to allow discharge to the venue listed below.       Follow Up Recommendations Home health PT;Supervision/Assistance - 24 hour;DC plan and follow up therapy as arranged by surgeon    Equipment Recommendations  Rolling walker with 5" wheels;3in1 (PT)    Recommendations for Other Services       Precautions / Restrictions Precautions Precautions: Fall Restrictions Weight Bearing Restrictions: Yes RLE Weight Bearing: Weight bearing as tolerated      Mobility  Bed Mobility               General bed mobility comments: OOB at entry  Transfers Overall transfer level: Needs assistance Equipment used: Rolling walker (2 wheeled) Transfers: Sit to/from Stand Sit to Stand: Min guard         General transfer comment: Cues for hand placement and safety with RW. Min Guard for safety.  Ambulation/Gait Ambulation/Gait assistance: Min  guard;Supervision Ambulation Distance (Feet): 100 Feet Assistive device: Rolling walker (2 wheeled) Gait Pattern/deviations: Step-through pattern;Antalgic Gait velocity: decreased   General Gait Details: progressed from min guard to supervision. Cues for heel strike and step length, sequencing with RW.   Stairs            Wheelchair Mobility    Modified Rankin (Stroke Patients Only)       Balance Overall balance assessment: Needs assistance Sitting-balance support: Feet unsupported;No upper extremity supported Sitting balance-Leahy Scale: Good     Standing balance support: During functional activity;Bilateral upper extremity supported Standing balance-Leahy Scale: Fair Standing balance comment: BUE support for dynamic balance at this time.                              Pertinent Vitals/Pain Pain Assessment: Faces Faces Pain Scale: Hurts little more Pain Location: R HIP Pain Descriptors / Indicators: Discomfort;Operative site guarding Pain Intervention(s): Limited activity within patient's tolerance;Monitored during session;Premedicated before session    [place] expects to be discharged to:: Private residence Living Arrangements: Spouse/significant other Available Help at Discharge: Family;Available 24 hours/day Type of Home: House Home Access: Stairs to enter   [employer] of Steps: 3 Home Layout: Two level Home Equipment: Bedside commode      Prior Function Level of Independence: Independent         Comments: mod I, golfing, using gym etc     Hand Dominance        Extremity/Trunk Assessment  Upper Extremity Assessment Upper Extremity Assessment: Overall WFL for tasks assessed    Lower Extremity Assessment Lower Extremity Assessment: (Gross Strength 4/5 BL. Hip Flexion limited post op RLE 3-/5)       Communication   Communication: No difficulties  Cognition Arousal/Alertness: Awake/alert Behavior  During Therapy: WFL for tasks assessed/performed Overall Cognitive Status: Within Functional Limits for tasks assessed                                        General Comments General comments (skin integrity, edema, etc.): Patient with some nausea, RN reecently given Zofran. SpO2= 88-92% on RA, breathing techiqnues cued returns to >90%    Exercises Total Joint Exercises Hip ABduction/ADduction: AROM;Right;10 reps;Standing Knee Flexion: AROM;Both;10 reps Marching in Standing: AROM;Right;10 reps Standing Hip Extension: AROM;Both;10 reps   Assessment/Plan    PT Assessment Patient needs continued PT services  PT Problem List Decreased strength;Decreased range of motion;Decreased activity tolerance;Decreased balance;Decreased mobility;Pain       PT Treatment Interventions DME instruction;Stair training;Gait training;Functional mobility training;Therapeutic activities;Therapeutic exercise    PT Goals (Current goals can be found in the Care Plan section)  Acute Rehab PT Goals Patient Stated Goal: Return home PT Goal Formulation: With patient Time For Goal Achievement: 04/04/17 Potential to Achieve Goals: Good    Frequency 7X/week   Barriers to discharge        Co-evaluation               AM-PAC PT "6 Clicks" Daily Activity  Outcome Measure Difficulty turning over in bed (including adjusting bedclothes, sheets and blankets)?: A Little Difficulty moving from lying on back to sitting on the side of the bed? : A Little Difficulty sitting down on and standing up from a chair with arms (e.g., wheelchair, bedside commode, etc,.)?: A Little Help needed moving to and from a bed to chair (including a wheelchair)?: A Little Help needed walking in hospital room?: A Little Help needed climbing 3-5 steps with a railing? : A Lot 6 Click Score: 17    End of Session Equipment Utilized During Treatment: Gait belt Activity Tolerance: Patient tolerated treatment well      PT Visit Diagnosis: Unsteadiness on feet (R26.81);Other abnormalities of gait and mobility (R26.89);Muscle weakness (generalized) (M62.81);Pain Pain - Right/Left: Right Pain - part of body: Hip    Time: 0915-0940 PT Time Calculation (min) (ACUTE ONLY): 25 min   Charges:   PT Evaluation $PT Eval Low Complexity: 1 Low PT Treatments $Gait Training: 8-22 mins   PT G Codes:        Reinaldo Berber, PT, DPT Acute Rehab Services Pager: [phone]    Reinaldo Berber 03/27/2017, 9:53 AM

## 2017-03-27 NOTE — Anesthesia Postprocedure Evaluation (Signed)
Anesthesia Post Note  Patient: Corey [name]  Procedure(s) Performed: RIGHT TOTAL HIP ARTHROPLASTY ANTERIOR APPROACH (Right Hip)     Patient location during evaluation: PACU Anesthesia Type: General Level of consciousness: awake and alert Pain management: pain level controlled Vital Signs Assessment: post-procedure vital signs reviewed and stable Respiratory status: spontaneous breathing, nonlabored ventilation and respiratory function stable Cardiovascular status: blood pressure returned to baseline and stable Postop Assessment: no apparent nausea or vomiting Anesthetic complications: no    Last Vitals:  Vitals:   03/27/17 0239 03/27/17 0510  BP: (!) 141/87 138/70  Pulse: (!) 103 97  Resp: 17 17  Temp: 37 C 36.6 C  SpO2: 94% 95%    Last Pain:  Vitals:   03/27/17 0600  TempSrc:   PainSc: 4                  Catalina Gravel

## 2017-03-27 NOTE — Progress Notes (Signed)
Physical Therapy Treatment Patient Details Name: Corey [name] MRN: [id number] DOB: 1946-03-05 Today's Date: 03/27/2017    History of Present Illness 72 y.o. male s/p R Direct Anterior THA. PMH includes: LBBB, HTN, GERD, CAD, Renal CA, Ascending aortic aneurysm, arthritis,  lumbar spine surgery, B TKA, elbow surgery, partial nephrectomy.     PT Comments    Session focused extensively on stair training (see general stair comments below). Various methods addressed to cover all staircases at patients home. Pt min guard to supervision level with stairs, pt and wife report confidence and comfort with safely returning home today. Pt has no further questions or concerns for PT at this time and will do well with HHPT.      Follow Up Recommendations  Home health PT;Supervision/Assistance - 24 hour;DC plan and follow up therapy as arranged by surgeon     Equipment Recommendations  Rolling walker with 5" wheels;3in1 (PT)    Recommendations for Other Services       Precautions / Restrictions Precautions Precautions: Fall Restrictions Weight Bearing Restrictions: Yes RLE Weight Bearing: Weight bearing as tolerated    Mobility  Bed Mobility Overal bed mobility: Needs Assistance Bed Mobility: Supine to Sit;Sit to Supine     Supine to sit: Supervision Sit to supine: Min assist   General bed mobility comments: Assist for RLE into bed.  Transfers Overall transfer level: Needs assistance Equipment used: Rolling walker (2 wheeled) Transfers: Sit to/from Stand Sit to Stand: Supervision         General transfer comment: for safety. Pt with c/o dizziness initially with positional changes  Ambulation/Gait                 Stairs Stairs: Yes   Stair Management: One rail Left;Backwards;Sideways;With walker Number of Stairs: 12 General stair comments: two methods trained. backwards with RW to enter through garage at home, min guard cues for sequencing with RW. Also trained  side step with BUE support on 1 rail step to step pattern for full flight inside home. Min guard. Patient able to ambulate forward but advised for now sideways with BUE support is safer. Pt and family report comfort with safe stair entrance at this time.   Wheelchair Mobility    Modified Rankin (Stroke Patients Only)       Balance Overall balance assessment: Needs assistance Sitting-balance support: No upper extremity supported;Feet supported Sitting balance-Leahy Scale: Good     Standing balance support: No upper extremity supported;During functional activity Standing balance-Leahy Scale: Fair Standing balance comment: for static standing without UE support                            Cognition Arousal/Alertness: Awake/alert Behavior During Therapy: WFL for tasks assessed/performed Overall Cognitive Status: Within Functional Limits for tasks assessed                                        Exercises Total Joint Exercises Hip ABduction/ADduction: AROM;Right;10 reps;Standing Knee Flexion: AROM;Both;10 reps Marching in Standing: AROM;Right;10 reps Standing Hip Extension: AROM;Both;10 reps    General Comments General comments (skin integrity, edema, etc.): SpO2=92% after activity reports some dizziness with postional changes, has not eaten much today.       Pertinent Vitals/Pain Pain Assessment: Faces Faces Pain Scale: Hurts little more Pain Location: R hip Pain Descriptors / Indicators: Discomfort;Sore Pain Intervention(s): Limited  activity within patient's tolerance;Monitored during session    Home Living                      Prior Function            PT Goals (current goals can now be found in the care plan section) Acute Rehab PT Goals Patient Stated Goal: Return home PT Goal Formulation: With patient Time For Goal Achievement: 04/04/17 Potential to Achieve Goals: Good Progress towards PT goals: Progressing toward goals     Frequency    7X/week      PT Plan Current plan remains appropriate    Co-evaluation              AM-PAC PT "6 Clicks" Daily Activity  Outcome Measure  Difficulty turning over in bed (including adjusting bedclothes, sheets and blankets)?: A Little Difficulty moving from lying on back to sitting on the side of the bed? : A Little Difficulty sitting down on and standing up from a chair with arms (e.g., wheelchair, bedside commode, etc,.)?: A Little Help needed moving to and from a bed to chair (including a wheelchair)?: A Little Help needed walking in hospital room?: A Little Help needed climbing 3-5 steps with a railing? : A Little 6 Click Score: 18    End of Session Equipment Utilized During Treatment: Gait belt Activity Tolerance: Patient tolerated treatment well     PT Visit Diagnosis: Unsteadiness on feet (R26.81);Other abnormalities of gait and mobility (R26.89);Muscle weakness (generalized) (M62.81);Pain Pain - Right/Left: Right Pain - part of body: Hip     Time: 1505-6979 PT Time Calculation (min) (ACUTE ONLY): 17 min  Charges:  $Gait Training: 8-22 mins                    G Codes:      Reinaldo Berber, PT, DPT Acute Rehab Services Pager: [phone]     Reinaldo Berber 03/27/2017, 3:50 PM

## 2017-03-27 NOTE — Progress Notes (Signed)
Patient given discharge instructions, patient verbalized understanding. Home equipment was delivered. Patient left unit in stable condition with nursing staff via wheelchair.

## 2017-03-27 NOTE — Progress Notes (Signed)
   Assessment / Plan: 1 Day Post-Op  S/P Procedure(s) (LRB): RIGHT TOTAL HIP ARTHROPLASTY ANTERIOR APPROACH (Right) by Dr. Ernesta Amble. Percell Miller on 03/26/17  Principal Problem:   Primary osteoarthritis of right hip Active Problems:   LACTOSE INTOLERANCE   Essential hypertension   LBBB (left bundle branch block)   Aneurysm of thoracic aorta (HCC)   G E R D   Coronary artery disease   CKD (chronic kidney disease) stage 3, GFR 30-59 ml/min (HCC)  ADDITIONAL DIAGNOSIS:  Acute Blood Loss Anemia,   Primary osteoarthritis right hip Doing well postoperatively.  He has not yet been up out of bed with therapy.   Advance diet Up with therapy Incentive Spirometry Apply ice  Weight Bearing: Weight Bearing as Tolerated (WBAT)  Dressings: Maintain Mepilex.  VTE prophylaxis: Xarelto, SCDs, ambulation Dispo: Home later today after therapy sessions.  Subjective: Patient reports pain as mild to moderate.  Tolerating diet.  Urinating.  No CP, SOB.  Not yet OOB.  Objective:   VITALS:   Vitals:   03/26/17 2145 03/27/17 0239 03/27/17 0510 03/27/17 0636  BP: (!) 145/87 (!) 141/87 138/70   Pulse: 99 (!) 103 97   Resp: 18 17 17    Temp: 98 F (36.7 C) 98.6 F (37 C) 97.9 F (36.6 C)   TempSrc: Oral Oral Oral   SpO2: 98% 94% 95%   Height:    5' 11.5" (1.816 m)   CBC Latest Ref Rng & Units 03/14/2017 10/31/2014 12/26/2013  WBC 4.0 - 10.5 K/uL 5.1 4.5 6.0  Hemoglobin 13.0 - 17.0 g/dL 16.5 16.3 15.3  Hematocrit 39.0 - 52.0 % 46.7 46.2 43.1  Platelets 150 - 400 K/uL 171 173 142(L)   BMP Latest Ref Rng & Units 03/14/2017 03/22/2016 10/31/2014  Glucose 65 - 99 mg/dL 180(H) 142(H) 99  BUN 6 - 20 mg/dL 20 19 25(H)  Creatinine 0.61 - 1.24 mg/dL 1.56(H) 1.55(H) 1.59(H)  Sodium 135 - 145 mmol/L 138 140 138  Potassium 3.5 - 5.1 mmol/L 4.0 4.2 4.8  Chloride 101 - 111 mmol/L 105 106 106  CO2 22 - 32 mmol/L 23 21 27   Calcium 8.9 - 10.3 mg/dL 9.2 9.1 9.3   Intake/Output      01/08 0701 - 01/09 0700  01/09 0701 - 01/10 0700   P.O. 560    I.V. 2266.7    IV Piggyback 100    Total Intake 2926.7    Urine 1100    Blood 200    Total Output 1300    Net +1626.7            Physical Exam: General: NAD.  Upright in bed.  Pleasant, calm, conversant.  No increased work of breathing  MSK Neurovascularly intact Sensation intact distally Intact pulses distally Dorsiflexion/Plantar flexion intact Incision: dressing C/D/I   Prudencio Burly III, PA-C 03/27/2017, 8:27 AM

## 2017-03-27 NOTE — Care Management Note (Signed)
Case Management Note  Patient Details  Name: Corey [name] MRN: [id number] Date of Birth: [birth year]  Subjective/Objective:                    Action/Plan:  [name] has been delivered to patient's room. Patient already has 3 in 1 at home.  Expected Discharge Date:  03/27/17               Expected Discharge Plan:  [name]  In-House Referral:     Discharge planning Services  CM Consult  Post Acute Care Choice:  Durable Medical Equipment Choice offered to:  Patient, Spouse  DME Arranged:  Walker rolling DME Agency:  [employer]  HH Arranged:  PT [place] Agency:  [facility] [employer]  Status of Service:  Completed, signed off  If discussed at [employer], dates discussed:    Additional Comments:  Marilu Favre, RN 03/27/2017, 10:26 AM

## 2017-03-27 NOTE — Evaluation (Addendum)
Occupational Therapy Evaluation and Discharge Patient Details Name: Corey [name] MRN: [id number] DOB: 02-25-1946 Today's Date: 03/27/2017    History of Present Illness 72 y.o. male s/p R Direct Anterior THA. PMH includes: LBBB, HTN, GERD, CAD, Renal CA, Ascending aortic aneurysm, arthritis,  lumbar spine surgery, B TKA, elbow surgery, partial nephrectomy.    Clinical Impression   Pt reports he was active and independent with ADL and mobility PTA. Currently pt requires supervision for ADL and functional mobility with the exception of min assist for LB ADL. All hip, safety, and ADL education completed with pt and wife. Pt planning to d/c home with 24/7 supervision from family. No further acute OT needs identified; signing off at this time. Please re-consult if needs change. Thank you for this referral.    Follow Up Recommendations  No OT follow up    Equipment Recommendations  None recommended by OT    Recommendations for Other Services       Precautions / Restrictions Precautions Precautions: Fall Restrictions Weight Bearing Restrictions: Yes RLE Weight Bearing: Weight bearing as tolerated      Mobility Bed Mobility Overal bed mobility: Needs Assistance Bed Mobility: Supine to Sit;Sit to Supine     Supine to sit: Supervision Sit to supine: Min assist   General bed mobility comments: Assist for RLE into bed.  Transfers Overall transfer level: Needs assistance Equipment used: None Transfers: Sit to/from Stand Sit to Stand: Supervision         General transfer comment: for safety. Pt with c/o dizziness initially with positional changes    Balance Overall balance assessment: Needs assistance Sitting-balance support: No upper extremity supported;Feet supported Sitting balance-Leahy Scale: Good     Standing balance support: No upper extremity supported;During functional activity Standing balance-Leahy Scale: Fair Standing balance comment: for static standing  without UE support                           ADL either performed or assessed with clinical judgement   ADL Overall ADL's : Needs assistance/impaired Eating/Feeding: Independent;Sitting   Grooming: Supervision/safety;Standing   Upper Body Bathing: Set up;Sitting   Lower Body Bathing: Minimal assistance;Sit to/from stand   Upper Body Dressing : Set up;Sitting   Lower Body Dressing: Minimal assistance;Sit to/from stand Lower Body Dressing Details (indicate cue type and reason): To start clothing over RLE and for sock/shoe on R foot. Wife to assist as needed Toilet Transfer: Supervision/safety;Ambulation;RW Toilet Transfer Details (indicate cue type and reason): Simulated by sit to stand from EOB with functional mobility. Educated on use of 3 in 1 over toilet.     Tub/ Shower Transfer: Supervision/safety;Walk-in shower;Ambulation;3 in 1;Rolling walker Tub/Shower Transfer Details (indicate cue type and reason): Educated on proper technique for shower transfer with RW and use of shower chair or 3 in 1. Pt able to return demo simulation of shower transfer in room with supervision. Functional mobility during ADLs: Supervision/safety;Rolling walker       Vision         Perception     Praxis      Pertinent Vitals/Pain Pain Assessment: Faces Faces Pain Scale: Hurts little more Pain Location: R hip Pain Descriptors / Indicators: Discomfort;Sore Pain Intervention(s): Monitored during session;Repositioned;Ice applied     Hand Dominance     Extremity/Trunk Assessment Upper Extremity Assessment Upper Extremity Assessment: Overall WFL for tasks assessed   Lower Extremity Assessment Lower Extremity Assessment: Defer to PT evaluation   Cervical /  Trunk Assessment Cervical / Trunk Assessment: Normal   Communication Communication Communication: No difficulties   Cognition Arousal/Alertness: Awake/alert Behavior During Therapy: WFL for tasks  assessed/performed Overall Cognitive Status: Within Functional Limits for tasks assessed                                     General Comments      Exercises    Shoulder Instructions      Home Living Family/patient expects to be discharged to:: Private residence Living Arrangements: Spouse/significant other Available Help at Discharge: Family;Available 24 hours/day Type of Home: House Home Access: Stairs to enter [employer] of Steps: 3   Home Layout: Two level;Bed/bath upstairs     Bathroom Shower/Tub: Occupational psychologist: Standard     Home Equipment: Bedside commode;Shower seat          Prior Functioning/Environment Level of Independence: Independent        Comments: works, Hydrologist, works out at gym        OT Problem List:        OT Treatment/Interventions:      OT Goals(Current goals can be found in the care plan section) Acute Rehab OT Goals Patient Stated Goal: Return home OT Goal Formulation: All assessment and education complete, DC therapy  OT Frequency:     Barriers to D/C:            Co-evaluation              AM-PAC PT "6 Clicks" Daily Activity     Outcome Measure Help from another person eating meals?: None Help from another person taking care of personal grooming?: A Little Help from another person toileting, which includes using toliet, bedpan, or urinal?: A Little Help from another person bathing (including washing, rinsing, drying)?: A Little Help from another person to put on and taking off regular upper body clothing?: None Help from another person to put on and taking off regular lower body clothing?: A Little 6 Click Score: 20   End of Session Equipment Utilized During Treatment: Rolling walker  Activity Tolerance: Patient tolerated treatment well Patient left: in bed;with call bell/phone within reach;with family/visitor present  OT Visit Diagnosis: Other abnormalities of gait and  mobility (R26.89);Pain Pain - Right/Left: Right Pain - part of body: Hip                Time: 4010-2725 OT Time Calculation (min): 19 min Charges:  OT General Charges $OT Visit: 1 Visit OT Evaluation $OT Eval Moderate Complexity: 1 Mod G-Codes:     Slevin Gunby A. Ulice Brilliant, M.S., OTR/L Pager: [place] 03/27/2017, 10:35 AM

## 2017-03-29 DIAGNOSIS — I447 Left bundle-branch block, unspecified: Secondary | ICD-10-CM | POA: Diagnosis not present

## 2017-03-29 DIAGNOSIS — Z96641 Presence of right artificial hip joint: Secondary | ICD-10-CM | POA: Diagnosis not present

## 2017-03-29 DIAGNOSIS — I129 Hypertensive chronic kidney disease with stage 1 through stage 4 chronic kidney disease, or unspecified chronic kidney disease: Secondary | ICD-10-CM | POA: Diagnosis not present

## 2017-03-29 DIAGNOSIS — I2510 Atherosclerotic heart disease of native coronary artery without angina pectoris: Secondary | ICD-10-CM | POA: Diagnosis not present

## 2017-03-29 DIAGNOSIS — N183 Chronic kidney disease, stage 3 (moderate): Secondary | ICD-10-CM | POA: Diagnosis not present

## 2017-03-29 DIAGNOSIS — Z471 Aftercare following joint replacement surgery: Secondary | ICD-10-CM | POA: Diagnosis not present

## 2017-04-07 ENCOUNTER — Other Ambulatory Visit: Payer: Self-pay | Admitting: Cardiology

## 2017-04-08 ENCOUNTER — Ambulatory Visit: Payer: Medicare Other | Admitting: Cardiology

## 2017-04-08 DIAGNOSIS — M1611 Unilateral primary osteoarthritis, right hip: Secondary | ICD-10-CM | POA: Diagnosis not present

## 2017-04-24 ENCOUNTER — Ambulatory Visit: Payer: Medicare Other | Admitting: Cardiology

## 2017-04-25 DIAGNOSIS — M1611 Unilateral primary osteoarthritis, right hip: Secondary | ICD-10-CM | POA: Diagnosis not present

## 2017-04-25 DIAGNOSIS — M25551 Pain in right hip: Secondary | ICD-10-CM | POA: Diagnosis not present

## 2017-04-26 ENCOUNTER — Other Ambulatory Visit: Payer: Medicare Other | Admitting: *Deleted

## 2017-04-26 DIAGNOSIS — I712 Thoracic aortic aneurysm, without rupture: Secondary | ICD-10-CM | POA: Diagnosis not present

## 2017-04-26 DIAGNOSIS — M1611 Unilateral primary osteoarthritis, right hip: Secondary | ICD-10-CM | POA: Diagnosis not present

## 2017-04-26 DIAGNOSIS — Z01818 Encounter for other preprocedural examination: Secondary | ICD-10-CM | POA: Diagnosis not present

## 2017-04-26 DIAGNOSIS — I10 Essential (primary) hypertension: Secondary | ICD-10-CM | POA: Diagnosis not present

## 2017-04-27 LAB — BASIC METABOLIC PANEL
BUN/Creatinine Ratio: 15 (ref 10–24)
BUN: 25 mg/dL (ref 8–27)
CO2: 20 mmol/L (ref 20–29)
CREATININE: 1.69 mg/dL — AB (ref 0.76–1.27)
Calcium: 9 mg/dL (ref 8.6–10.2)
Chloride: 105 mmol/L (ref 96–106)
GFR, EST AFRICAN AMERICAN: 46 mL/min/{1.73_m2} — AB (ref >59)
GFR, EST NON AFRICAN AMERICAN: 40 mL/min/{1.73_m2} — AB (ref >59)
Glucose: 144 mg/dL — ABNORMAL HIGH (ref 65–99)
Potassium: 4.5 mmol/L (ref 3.5–5.2)
Sodium: 142 mmol/L (ref 134–144)

## 2017-04-29 ENCOUNTER — Ambulatory Visit (INDEPENDENT_AMBULATORY_CARE_PROVIDER_SITE_OTHER)
Admission: RE | Admit: 2017-04-29 | Discharge: 2017-04-29 | Disposition: A | Discharge status: Home / Self Care | Payer: Medicare Other | Source: Ambulatory Visit | Attending: Student | Admitting: Student

## 2017-04-29 DIAGNOSIS — I712 Thoracic aortic aneurysm, without rupture: Secondary | ICD-10-CM

## 2017-04-29 MED ORDER — IOPAMIDOL (ISOVUE-370) INJECTION 76%
80.0000 mL | Freq: Once | INTRAVENOUS | Status: AC | PRN
  Administered 2017-04-29: 80 mL via INTRAVENOUS

## 2017-05-06 DIAGNOSIS — M1611 Unilateral primary osteoarthritis, right hip: Secondary | ICD-10-CM | POA: Diagnosis not present

## 2017-05-13 DIAGNOSIS — M1611 Unilateral primary osteoarthritis, right hip: Secondary | ICD-10-CM | POA: Diagnosis not present

## 2017-05-27 DIAGNOSIS — M1611 Unilateral primary osteoarthritis, right hip: Secondary | ICD-10-CM | POA: Diagnosis not present

## 2017-06-12 DIAGNOSIS — M25511 Pain in right shoulder: Secondary | ICD-10-CM | POA: Diagnosis not present

## 2017-06-12 DIAGNOSIS — M1611 Unilateral primary osteoarthritis, right hip: Secondary | ICD-10-CM | POA: Diagnosis not present

## 2017-06-12 DIAGNOSIS — M25532 Pain in left wrist: Secondary | ICD-10-CM | POA: Diagnosis not present

## 2017-06-20 DIAGNOSIS — M25561 Pain in right knee: Secondary | ICD-10-CM | POA: Diagnosis not present

## 2017-06-20 DIAGNOSIS — M25532 Pain in left wrist: Secondary | ICD-10-CM | POA: Diagnosis not present

## 2017-06-26 ENCOUNTER — Telehealth: Payer: Self-pay

## 2017-06-26 DIAGNOSIS — M25532 Pain in left wrist: Secondary | ICD-10-CM | POA: Diagnosis not present

## 2017-06-26 NOTE — Telephone Encounter (Signed)
   [facility] Pre-operative Risk Assessment    Request for surgical clearance:  1. What type of surgery is being performed? Lipoma excision left wrist  2. When is this surgery scheduled? 07/25/17  3. What type of clearance is required (medical clearance vs. Pharmacy clearance to hold med vs. Both)? Both  4. Are there any medications that need to be held prior to surgery and how long?Aspirin  5. Practice name and name of physician performing surgery? [facility]  Dr.Timothy Percell Miller  6. What is your office phone number [phone]   7.   What is your office fax number [phone]  8.   Anesthesia type (None, local, MAC, general) ? Karie Soda 06/26/2017, 4:47 PM  _________________________________________________________________   (provider comments below)

## 2017-06-27 NOTE — Telephone Encounter (Signed)
   Primary Cardiologist: Peter Martinique, MD  Chart reviewed as part of pre-operative protocol coverage. Given past medical history and time since last visit, based on ACC/AHA guidelines, Corey [name] would be at acceptable risk for the planned procedure without further cardiovascular testing.  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  Ok to hold Asprin for procedure.    I will route this recommendation to the requesting party via Epic fax function and remove from pre-op pool.  Please call with questions.  Cecilie Kicks, NP 06/27/2017, 4:14 PM

## 2017-07-04 DIAGNOSIS — M25532 Pain in left wrist: Secondary | ICD-10-CM | POA: Diagnosis not present

## 2017-07-25 ENCOUNTER — Other Ambulatory Visit: Payer: Self-pay

## 2017-07-25 DIAGNOSIS — G8918 Other acute postprocedural pain: Secondary | ICD-10-CM | POA: Diagnosis not present

## 2017-07-25 DIAGNOSIS — D1722 Benign lipomatous neoplasm of skin and subcutaneous tissue of left arm: Secondary | ICD-10-CM | POA: Diagnosis not present

## 2017-08-05 DIAGNOSIS — D1722 Benign lipomatous neoplasm of skin and subcutaneous tissue of left arm: Secondary | ICD-10-CM | POA: Diagnosis not present

## 2017-08-07 DIAGNOSIS — D225 Melanocytic nevi of trunk: Secondary | ICD-10-CM | POA: Diagnosis not present

## 2017-08-07 DIAGNOSIS — D1801 Hemangioma of skin and subcutaneous tissue: Secondary | ICD-10-CM | POA: Diagnosis not present

## 2017-08-07 DIAGNOSIS — L814 Other melanin hyperpigmentation: Secondary | ICD-10-CM | POA: Diagnosis not present

## 2017-08-07 DIAGNOSIS — D485 Neoplasm of uncertain behavior of skin: Secondary | ICD-10-CM | POA: Diagnosis not present

## 2017-08-07 DIAGNOSIS — L570 Actinic keratosis: Secondary | ICD-10-CM | POA: Diagnosis not present

## 2017-08-07 DIAGNOSIS — D0339 Melanoma in situ of other parts of face: Secondary | ICD-10-CM | POA: Diagnosis not present

## 2017-08-07 DIAGNOSIS — D2261 Melanocytic nevi of right upper limb, including shoulder: Secondary | ICD-10-CM | POA: Diagnosis not present

## 2017-08-07 DIAGNOSIS — Z85820 Personal history of malignant melanoma of skin: Secondary | ICD-10-CM | POA: Diagnosis not present

## 2017-08-07 DIAGNOSIS — L821 Other seborrheic keratosis: Secondary | ICD-10-CM | POA: Diagnosis not present

## 2017-08-26 DIAGNOSIS — L988 Other specified disorders of the skin and subcutaneous tissue: Secondary | ICD-10-CM | POA: Diagnosis not present

## 2017-08-26 DIAGNOSIS — Z85820 Personal history of malignant melanoma of skin: Secondary | ICD-10-CM | POA: Diagnosis not present

## 2017-08-26 DIAGNOSIS — D0339 Melanoma in situ of other parts of face: Secondary | ICD-10-CM | POA: Diagnosis not present

## 2017-08-27 DIAGNOSIS — D0339 Melanoma in situ of other parts of face: Secondary | ICD-10-CM | POA: Diagnosis not present

## 2017-09-02 DIAGNOSIS — D1722 Benign lipomatous neoplasm of skin and subcutaneous tissue of left arm: Secondary | ICD-10-CM | POA: Diagnosis not present

## 2017-11-15 DIAGNOSIS — I10 Essential (primary) hypertension: Secondary | ICD-10-CM | POA: Diagnosis not present

## 2017-11-15 DIAGNOSIS — M545 Low back pain, unspecified: Secondary | ICD-10-CM | POA: Insufficient documentation

## 2017-11-15 DIAGNOSIS — M5136 Other intervertebral disc degeneration, lumbar region: Secondary | ICD-10-CM | POA: Diagnosis not present

## 2017-11-15 DIAGNOSIS — Z6829 Body mass index (BMI) 29.0-29.9, adult: Secondary | ICD-10-CM | POA: Diagnosis not present

## 2017-12-12 DIAGNOSIS — Z23 Encounter for immunization: Secondary | ICD-10-CM | POA: Diagnosis not present

## 2018-01-14 DIAGNOSIS — H524 Presbyopia: Secondary | ICD-10-CM | POA: Diagnosis not present

## 2018-01-14 DIAGNOSIS — H2513 Age-related nuclear cataract, bilateral: Secondary | ICD-10-CM | POA: Diagnosis not present

## 2018-01-30 DIAGNOSIS — Z125 Encounter for screening for malignant neoplasm of prostate: Secondary | ICD-10-CM | POA: Diagnosis not present

## 2018-01-30 DIAGNOSIS — R82998 Other abnormal findings in urine: Secondary | ICD-10-CM | POA: Diagnosis not present

## 2018-01-30 DIAGNOSIS — R7309 Other abnormal glucose: Secondary | ICD-10-CM | POA: Diagnosis not present

## 2018-01-30 DIAGNOSIS — E7849 Other hyperlipidemia: Secondary | ICD-10-CM | POA: Diagnosis not present

## 2018-01-30 DIAGNOSIS — I10 Essential (primary) hypertension: Secondary | ICD-10-CM | POA: Diagnosis not present

## 2018-02-06 DIAGNOSIS — I712 Thoracic aortic aneurysm, without rupture: Secondary | ICD-10-CM | POA: Diagnosis not present

## 2018-02-06 DIAGNOSIS — I10 Essential (primary) hypertension: Secondary | ICD-10-CM | POA: Diagnosis not present

## 2018-02-06 DIAGNOSIS — N183 Chronic kidney disease, stage 3 (moderate): Secondary | ICD-10-CM | POA: Diagnosis not present

## 2018-02-06 DIAGNOSIS — D2261 Melanocytic nevi of right upper limb, including shoulder: Secondary | ICD-10-CM | POA: Diagnosis not present

## 2018-02-06 DIAGNOSIS — D0339 Melanoma in situ of other parts of face: Secondary | ICD-10-CM | POA: Diagnosis not present

## 2018-02-06 DIAGNOSIS — M1712 Unilateral primary osteoarthritis, left knee: Secondary | ICD-10-CM | POA: Diagnosis not present

## 2018-02-06 DIAGNOSIS — Z1389 Encounter for screening for other disorder: Secondary | ICD-10-CM | POA: Diagnosis not present

## 2018-02-06 DIAGNOSIS — L821 Other seborrheic keratosis: Secondary | ICD-10-CM | POA: Diagnosis not present

## 2018-02-06 DIAGNOSIS — C649 Malignant neoplasm of unspecified kidney, except renal pelvis: Secondary | ICD-10-CM | POA: Diagnosis not present

## 2018-02-06 DIAGNOSIS — Z85820 Personal history of malignant melanoma of skin: Secondary | ICD-10-CM | POA: Diagnosis not present

## 2018-02-06 DIAGNOSIS — E7849 Other hyperlipidemia: Secondary | ICD-10-CM | POA: Diagnosis not present

## 2018-02-06 DIAGNOSIS — L814 Other melanin hyperpigmentation: Secondary | ICD-10-CM | POA: Diagnosis not present

## 2018-02-06 DIAGNOSIS — Z6828 Body mass index (BMI) 28.0-28.9, adult: Secondary | ICD-10-CM | POA: Diagnosis not present

## 2018-02-06 DIAGNOSIS — N200 Calculus of kidney: Secondary | ICD-10-CM | POA: Diagnosis not present

## 2018-02-06 DIAGNOSIS — Z0000 Encounter for general adult medical examination without abnormal findings: Secondary | ICD-10-CM | POA: Diagnosis not present

## 2018-02-06 DIAGNOSIS — R7309 Other abnormal glucose: Secondary | ICD-10-CM | POA: Diagnosis not present

## 2018-02-06 DIAGNOSIS — D485 Neoplasm of uncertain behavior of skin: Secondary | ICD-10-CM | POA: Diagnosis not present

## 2018-02-06 DIAGNOSIS — D225 Melanocytic nevi of trunk: Secondary | ICD-10-CM | POA: Diagnosis not present

## 2018-02-06 DIAGNOSIS — D2271 Melanocytic nevi of right lower limb, including hip: Secondary | ICD-10-CM | POA: Diagnosis not present

## 2018-02-06 DIAGNOSIS — L570 Actinic keratosis: Secondary | ICD-10-CM | POA: Diagnosis not present

## 2018-02-20 DIAGNOSIS — Z1212 Encounter for screening for malignant neoplasm of rectum: Secondary | ICD-10-CM | POA: Diagnosis not present

## 2018-02-24 DIAGNOSIS — L570 Actinic keratosis: Secondary | ICD-10-CM | POA: Diagnosis not present

## 2018-03-17 NOTE — Progress Notes (Signed)
Cardiology Office Note    Date:  03/20/2018   ID:  Corey [name], DOB 1945-09-01, MRN [id number]  PCP:  Leanna Battles, MD  Cardiologist: Dr. Martinique  Chief Complaint  Patient presents with  . Thoracic Aortic Aneurysm    History of Present Illness:    Corey [name] is a 72 y.o. male with past medical history of thoracic aortic aneurysm, Stage 3 CKD, chronic LBBB, and minimal CAD by cath in 2011 who presents for follow up   He was seen in 02/2016 and reported having dyspnea on exertion in the warmer temperatures but denied any associated chest discomfort. A repeat CTA was obtained in 03/2016 and showed a stable thoracic aortic aneurysm measuring 4.5cm in diameter with no acute changes (at 4.5 cm in 2015).   He was last seen in Dec 2018 for preoperative cardiac clearance for an  hip replacement. This was performed in January 4496 without complications. Repeat CT chest in February 2019 showed no change in thoracic aneurysm.  On follow up today he is doing well. Complains of some low back pain followed by Dr. Arnoldo Morale. Takes Voltaren once or twice a day. States he can't function without it. No chest pain, palpitations, dizziness. Still gets SOB when exerting in hot weather. [facility] and [facility] out on a bike or treadmill.    Past Medical History:  Diagnosis Date  . Arthritis 10-22-11   hx. osteoarthritis-knees  . Ascending aortic aneurysm [facility])    Last CT in July 2012  . Cancer ([place])    right kidney  . Coronary artery disease    minimal per cath in 2011  . Dyspnea   . GERD (gastroesophageal reflux disease)   . Heart murmur   . History of kidney stones    "YRS AGO"  nothing in last 10 yrs  . Hypertension   . Lactose intolerance    used to be, not anymore per patient.  . Left bundle branch block   . Nephrolithiasis     Past Surgical History:  Procedure Laterality Date  . APPENDECTOMY    . BACK SURGERY    . BUNIONECTOMY Right   . CARDIAC CATHETERIZATION   12/22/09   EF60%/minimal nonobstructive atherosclerotic coronary artery disease/normal lt ventricular function/mod aortic root dilatation  . CYSTOSCOPY WITH RETROGRADE PYELOGRAM, URETEROSCOPY AND STENT PLACEMENT Left 07/28/2012   Procedure: CYSTOSCOPY WITH RETROGRADE PYELOGRAM, LEFT URETEROSCOPY ;  Surgeon: Dutch Gray, MD;  Location: [facility];  Service: Urology;  Laterality: Left;  . CYSTOSCOPY/RETROGRADE/URETEROSCOPY/STONE EXTRACTION WITH BASKET  10-22-11   1'12  . ELBOW SURGERY Right 1990s?   "tendon repaired"  . JOINT REPLACEMENT    . KNEE ARTHROSCOPY     "? side"  . LUMBAR SPINE SURGERY  2003   Lumbar fusion -retained hardware([name])  . PARTIAL NEPHRECTOMY Right 10/2011  . TONSILLECTOMY    . TOTAL HIP ARTHROPLASTY Right 03/26/2017   Procedure: RIGHT TOTAL HIP ARTHROPLASTY ANTERIOR APPROACH;  Surgeon: Renette Butters, MD;  Location: [facility];  Service: Orthopedics;  Laterality: Right;  . TOTAL KNEE ARTHROPLASTY     bilateral    Current Medications: Outpatient Medications Prior to Visit  Medication Sig Dispense Refill  . ALPRAZolam (XANAX) 0.25 MG tablet Take 0.25 mg by mouth at bedtime.    Marland Kitchen amLODipine (NORVASC) 5 MG tablet TAKE 1 TABLET BY MOUTH EVERY DAY 90 tablet 3  . diclofenac (VOLTAREN) 25 MG EC tablet Take 25 mg by mouth daily.  1  .  diphenhydrAMINE (BENADRYL) 25 mg capsule Take 50 mg by mouth at bedtime.     Marland Kitchen HYDROcodone-acetaminophen (NORCO) 5-325 MG tablet Take 1-2 tablets by mouth every 4 (four) hours as needed for moderate pain. 40 tablet 0  . losartan (COZAAR) 100 MG tablet TAKE 1 TABLET BY MOUTH EVERY DAY 90 tablet 3  . Melatonin 5 MG CAPS Take 10 mg by mouth at bedtime.    Marland Kitchen omeprazole (PRILOSEC) 20 MG capsule Take 20 mg by mouth every morning.     . potassium citrate (UROCIT-K) 10 MEQ (1080 MG) SR tablet Take 20 mEq by mouth 2 (two) times daily.    . pravastatin (PRAVACHOL) 40 MG tablet Take 40 mg by mouth daily.  3  . tamsulosin (FLOMAX) 0.4 MG CAPS capsule Take 0.4  mg by mouth daily.  11  . baclofen (LIORESAL) 10 MG tablet Take 1 tablet (10 mg total) by mouth 3 (three) times daily as needed for muscle spasms. 40 each 0  . docusate sodium (COLACE) 100 MG capsule Take 1 capsule (100 mg total) by mouth 2 (two) times daily. To prevent constipation while taking pain medication. 60 capsule 0  . ondansetron (ZOFRAN) 4 MG tablet Take 1 tablet (4 mg total) by mouth every 8 (eight) hours as needed for nausea or vomiting. 40 tablet 0  . rivaroxaban (XARELTO) 10 MG TABS tablet Take 1 tablet (10 mg total) by mouth daily. For 30 days for DVT prophylaxis 30 tablet 0   No facility-administered medications prior to visit.      Allergies:   Ketorolac   Social History   Socioeconomic History  . Marital status: Married    Spouse name: Not on file  . Number of children: 4  . Years of education: Not on file  . Highest education level: Not on file  Occupational History  . Occupation: Estate agent  Social Needs  . Financial resource strain: Not on file  . Food insecurity:    Worry: Not on file    Inability: Not on file  . Transportation needs:    Medical: Not on file    Non-medical: Not on file  Tobacco Use  . Smoking status: Never Smoker  . Smokeless tobacco: Never Used  Substance and Sexual Activity  . Alcohol use: No  . Drug use: No  . Sexual activity: Yes  Lifestyle  . Physical activity:    Days per week: Not on file    Minutes per session: Not on file  . Stress: Not on file  Relationships  . Social connections:    Talks on phone: Not on file    Gets together: Not on file    Attends religious service: Not on file    Active member of club or organization: Not on file    Attends meetings of clubs or organizations: Not on file    Relationship status: Not on file  Other Topics Concern  . Not on file  Social History Narrative  . Not on file     Family History:  The patient's family history includes Cancer in his father; Heart attack in his brother.    Review of Systems:   As noted in HPI. All other systems reviewed and are otherwise negative except as noted above.   Physical Exam:    VS:  BP 140/80 (BP Location: Left Arm, Cuff Size: Normal)   Pulse 70   Ht 5\' 11"  (1.803 m)   Wt 199 lb 3.2 oz (90.4 kg)  BMI 27.78 kg/m    GENERAL:  Well appearing WM in NAD HEENT:  PERRL, EOMI, sclera are clear. Oropharynx is clear. NECK:  No jugular venous distention, carotid upstroke brisk and symmetric, no bruits, no thyromegaly or adenopathy LUNGS:  Clear to auscultation bilaterally CHEST:  Unremarkable HEART:  RRR,  PMI not displaced or sustained,S1 and S2 within normal limits, no S3, no S4: no clicks, no rubs, gr 2/6 systolic murmur RUSB. ABD:  Soft, nontender. BS +, no masses or bruits. No hepatomegaly, no splenomegaly EXT:  2 + pulses throughout, no edema, no cyanosis no clubbing SKIN:  Warm and dry.  No rashes NEURO:  Alert and oriented x 3. Cranial nerves II through XII intact. PSYCH:  Cognitively intact      Wt Readings from Last 3 Encounters:  03/20/18 199 lb 3.2 oz (90.4 kg)  03/14/17 201 lb 4.5 oz (91.3 kg)  03/04/17 202 lb 6.4 oz (91.8 kg)     Studies/Labs Reviewed:   EKG:  EKG is ordered today.  The ekg ordered today demonstrates NSR, HR 70, with known LBBB. LAD. I have personally reviewed and interpreted this study.   Recent Labs: 04/26/2017: BUN 25; Creatinine, Ser 1.69; Potassium 4.5; Sodium 142   Lipid Panel No results found for: CHOL, TRIG, HDL, CHOLHDL, VLDL, LDLCALC, LDLDIRECT   Labs dated 01/31/18: cholesterol 147, triglycerides 130, HDL 44, LDL 77. A1c 5.1%. Creatinine 1.4. otherwise chemistries and Hgb normal.   Additional studies/ records that were reviewed today include:   Cardiac Catheterization: 2011    Echocardiogram: 07/2013 Study Conclusions  - Left ventricle: Abnormal septal motion. The cavity size was normal. Wall thickness was increased in a pattern of moderate LVH. Systolic  function was normal. The estimated ejection fraction was in the range of 50% to 55%. Doppler parameters are consistent with abnormal left ventricular relaxation (grade 1 diastolic dysfunction). - Atrial septum: No defect or patent foramen ovale was identified.   CT ANGIOGRAPHY CHEST WITH CONTRAST  TECHNIQUE: Multidetector CT imaging of the chest was performed using the standard protocol during bolus administration of intravenous contrast. Multiplanar CT image reconstructions and MIPs were obtained to evaluate the vascular anatomy.  CONTRAST:  58mL ISOVUE-370 IOPAMIDOL (ISOVUE-370) INJECTION 76%  COMPARISON:  Prior CTA of the chest 04/06/2016  FINDINGS: Cardiovascular: Stable fusiform aneurysmal dilatation of the ascending thoracic aorta of with a transverse diameter 4.5 cm. Conventional 3 vessel arch anatomy. No significant atherosclerotic plaque. The descending and transverse thoracic aorta are normal in caliber. No effacement of the sino-tubular junction. Unremarkable aortic root. The heart is normal in size. No pericardial effusion. Unremarkable pulmonary arteries.  Mediastinum/Nodes: Unremarkable CT appearance of the thyroid gland. No suspicious mediastinal or hilar adenopathy. No soft tissue mediastinal mass. The thoracic esophagus is unremarkable.  Lungs/Pleura: Lungs are clear. No pleural effusion or pneumothorax.  Upper Abdomen: Visualized upper abdominal organs are unremarkable. Stable hyperdense cyst in the upper pole of the right kidney compared to 01/25/2015.  Musculoskeletal: No acute fracture or aggressive appearing lytic or blastic osseous lesion.  Review of the MIP images confirms the above findings.  IMPRESSION: Stable 4.5 cm fusiform ascending thoracic aortic aneurysm. No interval change. Ascending thoracic aortic aneurysm. Recommend semi-annual imaging followup by CTA or MRA and referral to cardiothoracic surgery if not already  obtained. This recommendation follows 2010 ACCF/AHA/AATS/ACR/ASA/SCA/SCAI/SIR/STS/SVM Guidelines for the Diagnosis and Management of Patients With Thoracic Aortic Disease. Circulation. 2010; 121: Z610-R604  Signed,  Criselda Peaches, MD  [facility]  [facility]   Electronically Signed   By: Jacqulynn Cadet M.D.   On: 04/29/2017 16:38   Assessment:    1. Thoracic aortic aneurysm without rupture ([place])   2. Essential hypertension   3. LBBB (left bundle branch block)   4. Hyperlipidemia LDL goal <70      Plan:   In order of problems listed above:  1. HTN - BP is well-under fair control.  - continue Amlodipine 5mg  daily and Losartan 100mg  daily. Try and minimize use of NSAIDs with CKD.   2. LBBB - chronic for 20+ years. Catheterization in 2011 showed minimal CAD.  - continue ASA and statin therapy.   3. HLD - followed by PCP. Remains on Pravastatin 20mg  daily. Excellent control.  4. Thoracic Aortic Aneurysm - CTA in 04/2017 and showed a stable thoracic aortic aneurysm measuring 4.5cm in diameter with no acute changes. Unchanged on serial CT dating back to 2011. Will reduce frequency of CT to every other year.    5. Stage 3 CKD - baseline creatinine 1.5 - 1.6. maintain good hydration.     Signed,  [place], MD  03/20/2018 11:35 AM    [facility] [address], [place], [place] [address] Phone: [phone]

## 2018-03-20 ENCOUNTER — Encounter: Payer: Self-pay | Admitting: Cardiology

## 2018-03-20 ENCOUNTER — Ambulatory Visit (INDEPENDENT_AMBULATORY_CARE_PROVIDER_SITE_OTHER): Payer: Medicare Other | Admitting: Cardiology

## 2018-03-20 VITALS — BP 140/80 | HR 70 | Ht 71.0 in | Wt 199.2 lb

## 2018-03-20 DIAGNOSIS — E785 Hyperlipidemia, unspecified: Secondary | ICD-10-CM | POA: Diagnosis not present

## 2018-03-20 DIAGNOSIS — I447 Left bundle-branch block, unspecified: Secondary | ICD-10-CM

## 2018-03-20 DIAGNOSIS — I712 Thoracic aortic aneurysm, without rupture, unspecified: Secondary | ICD-10-CM

## 2018-03-20 DIAGNOSIS — I10 Essential (primary) hypertension: Secondary | ICD-10-CM | POA: Diagnosis not present

## 2018-04-04 ENCOUNTER — Other Ambulatory Visit: Payer: Self-pay | Admitting: Cardiology

## 2018-09-24 DIAGNOSIS — L814 Other melanin hyperpigmentation: Secondary | ICD-10-CM | POA: Diagnosis not present

## 2018-09-24 DIAGNOSIS — D2261 Melanocytic nevi of right upper limb, including shoulder: Secondary | ICD-10-CM | POA: Diagnosis not present

## 2018-09-24 DIAGNOSIS — Z85820 Personal history of malignant melanoma of skin: Secondary | ICD-10-CM | POA: Diagnosis not present

## 2018-09-24 DIAGNOSIS — L821 Other seborrheic keratosis: Secondary | ICD-10-CM | POA: Diagnosis not present

## 2018-09-24 DIAGNOSIS — D225 Melanocytic nevi of trunk: Secondary | ICD-10-CM | POA: Diagnosis not present

## 2018-09-24 DIAGNOSIS — L738 Other specified follicular disorders: Secondary | ICD-10-CM | POA: Diagnosis not present

## 2018-09-25 DIAGNOSIS — N200 Calculus of kidney: Secondary | ICD-10-CM | POA: Diagnosis not present

## 2018-09-25 DIAGNOSIS — R361 Hematospermia: Secondary | ICD-10-CM | POA: Diagnosis not present

## 2018-11-21 DIAGNOSIS — N401 Enlarged prostate with lower urinary tract symptoms: Secondary | ICD-10-CM | POA: Diagnosis not present

## 2018-11-21 DIAGNOSIS — R361 Hematospermia: Secondary | ICD-10-CM | POA: Diagnosis not present

## 2018-11-21 DIAGNOSIS — Z85528 Personal history of other malignant neoplasm of kidney: Secondary | ICD-10-CM | POA: Diagnosis not present

## 2018-11-21 DIAGNOSIS — M4316 Spondylolisthesis, lumbar region: Secondary | ICD-10-CM | POA: Diagnosis not present

## 2018-11-21 DIAGNOSIS — Z87442 Personal history of urinary calculi: Secondary | ICD-10-CM | POA: Diagnosis not present

## 2018-11-21 DIAGNOSIS — R3912 Poor urinary stream: Secondary | ICD-10-CM | POA: Diagnosis not present

## 2018-11-24 DIAGNOSIS — Z23 Encounter for immunization: Secondary | ICD-10-CM | POA: Diagnosis not present

## 2018-12-03 DIAGNOSIS — M48061 Spinal stenosis, lumbar region without neurogenic claudication: Secondary | ICD-10-CM | POA: Diagnosis not present

## 2018-12-03 DIAGNOSIS — M5136 Other intervertebral disc degeneration, lumbar region: Secondary | ICD-10-CM | POA: Diagnosis not present

## 2018-12-03 DIAGNOSIS — M4726 Other spondylosis with radiculopathy, lumbar region: Secondary | ICD-10-CM | POA: Diagnosis not present

## 2018-12-09 DIAGNOSIS — M5136 Other intervertebral disc degeneration, lumbar region: Secondary | ICD-10-CM | POA: Diagnosis not present

## 2018-12-09 DIAGNOSIS — I10 Essential (primary) hypertension: Secondary | ICD-10-CM | POA: Diagnosis not present

## 2018-12-09 DIAGNOSIS — Z6828 Body mass index (BMI) 28.0-28.9, adult: Secondary | ICD-10-CM | POA: Diagnosis not present

## 2018-12-09 DIAGNOSIS — M48062 Spinal stenosis, lumbar region with neurogenic claudication: Secondary | ICD-10-CM | POA: Diagnosis not present

## 2018-12-10 ENCOUNTER — Other Ambulatory Visit: Payer: Self-pay | Admitting: Neurosurgery

## 2018-12-11 DIAGNOSIS — H02831 Dermatochalasis of right upper eyelid: Secondary | ICD-10-CM | POA: Diagnosis not present

## 2018-12-11 DIAGNOSIS — H53483 Generalized contraction of visual field, bilateral: Secondary | ICD-10-CM | POA: Diagnosis not present

## 2018-12-11 DIAGNOSIS — H02832 Dermatochalasis of right lower eyelid: Secondary | ICD-10-CM | POA: Diagnosis not present

## 2018-12-11 DIAGNOSIS — H0279 Other degenerative disorders of eyelid and periocular area: Secondary | ICD-10-CM | POA: Diagnosis not present

## 2018-12-11 DIAGNOSIS — H02834 Dermatochalasis of left upper eyelid: Secondary | ICD-10-CM | POA: Diagnosis not present

## 2018-12-11 DIAGNOSIS — H02423 Myogenic ptosis of bilateral eyelids: Secondary | ICD-10-CM | POA: Diagnosis not present

## 2018-12-11 DIAGNOSIS — H02413 Mechanical ptosis of bilateral eyelids: Secondary | ICD-10-CM | POA: Diagnosis not present

## 2018-12-11 DIAGNOSIS — H02835 Dermatochalasis of left lower eyelid: Secondary | ICD-10-CM | POA: Diagnosis not present

## 2018-12-17 DIAGNOSIS — H53482 Generalized contraction of visual field, left eye: Secondary | ICD-10-CM | POA: Diagnosis not present

## 2018-12-17 DIAGNOSIS — H53481 Generalized contraction of visual field, right eye: Secondary | ICD-10-CM | POA: Diagnosis not present

## 2018-12-17 DIAGNOSIS — H53483 Generalized contraction of visual field, bilateral: Secondary | ICD-10-CM | POA: Diagnosis not present

## 2018-12-26 ENCOUNTER — Other Ambulatory Visit: Payer: Self-pay | Admitting: Neurosurgery

## 2018-12-29 NOTE — Progress Notes (Signed)
[id number] - [place], [place] [address] Phone: [phone] Fax: [phone]      Your procedure is scheduled on Monday, October 19th.  Report to [facility] Main Entrance "A" at 8:45 A.M., and check in at the Admitting office.  Call this number if you have problems the morning of surgery:  [phone]  Call [phone] if you have any questions prior to your surgery date Monday-Friday 8am-4pm    Remember:  Do not eat or drink after midnight the night before your surgery     Take these medicines the morning of surgery with A SIP OF WATER   Tylenol - if needed  Alprazolam (Xanax)  Amlodipine (Norvasc)  Omeprazole (Prilosec)  Follow your surgeon's instructions on when to stop Aspirin.  If no instructions were given by your surgeon then you will need to call the office to get those instructions.    7 days prior to surgery STOP taking any Aleve, Naproxen, Ibuprofen, Motrin, Advil, Goody's, BC's, all herbal medications, fish oil, and all vitamins.    The Morning of Surgery  Do not wear jewelry.  Do not wear lotions, powders, colognes, or deodorant  Men may shave face and neck.  Do not bring valuables to the hospital.  [facility] is not responsible for any belongings or valuables.  If you are a smoker, DO NOT Smoke 24 hours prior to surgery IF you wear a CPAP at night please bring your mask, tubing, and machine the morning of surgery   Remember that you must have someone to transport you home after your surgery, and remain with you for 24 hours if you are discharged the same day.   Contacts, glasses, hearing aids, dentures or bridgework may not be worn into surgery.    Leave your suitcase in the car.  After surgery it may be brought to your room.  For patients admitted to the hospital, discharge time will be determined by your treatment team.  Patients discharged the day of surgery will not be allowed to drive home.     Special instructions:   [facility]- Preparing For Surgery  Before surgery, you can play an important role. Because skin is not sterile, your skin needs to be as free of germs as possible. You can reduce the number of germs on your skin by washing with CHG (chlorahexidine gluconate) Soap before surgery.  CHG is an antiseptic cleaner which kills germs and bonds with the skin to continue killing germs even after washing.    Oral Hygiene is also important to reduce your risk of infection.  Remember - BRUSH YOUR TEETH THE MORNING OF SURGERY WITH YOUR REGULAR TOOTHPASTE  Please do not use if you have an allergy to CHG or antibacterial soaps. If your skin becomes reddened/irritated stop using the CHG.  Do not shave (including legs and underarms) for at least 48 hours prior to first CHG shower. It is OK to shave your face.  Please follow these instructions carefully.   1. Shower the NIGHT BEFORE SURGERY and the MORNING OF SURGERY with CHG Soap.   2. If you chose to wash your hair, wash your hair first as usual with your normal shampoo.  3. After you shampoo, rinse your hair and body thoroughly to remove the shampoo.  4. Use CHG as you would any other liquid soap. You can apply CHG directly to the skin and wash gently with a scrungie or a clean washcloth.  5. Apply the CHG Soap to your body ONLY FROM THE NECK DOWN.  Do not use on open wounds or open sores. Avoid contact with your eyes, ears, mouth and genitals (private parts). Wash Face and genitals (private parts)  with your normal soap.   6. Wash thoroughly, paying special attention to the area where your surgery will be performed.  7. Thoroughly rinse your body with warm water from the neck down.  8. DO NOT shower/wash with your normal soap after using and rinsing off the CHG Soap.  9. Pat yourself dry with a CLEAN TOWEL.  10. Wear CLEAN PAJAMAS to bed the night before surgery, wear comfortable clothes the morning of  surgery  11. Place CLEAN SHEETS on your bed the night of your first shower and DO NOT SLEEP WITH PETS.    Day of Surgery:  Do not apply any deodorants/lotions. Please shower the morning of surgery with the CHG soap  Please wear clean clothes to the hospital/surgery center.   Remember to brush your teeth WITH YOUR REGULAR TOOTHPASTE.   Please read over the following fact sheets that you were given.

## 2018-12-30 ENCOUNTER — Other Ambulatory Visit: Payer: Self-pay

## 2018-12-30 ENCOUNTER — Encounter (HOSPITAL_COMMUNITY): Payer: Self-pay

## 2018-12-30 ENCOUNTER — Encounter (HOSPITAL_COMMUNITY)
Admission: RE | Admit: 2018-12-30 | Discharge: 2018-12-30 | Disposition: A | Discharge status: Home / Self Care | Payer: Medicare Other | Source: Ambulatory Visit | Attending: Neurosurgery | Admitting: Neurosurgery

## 2018-12-30 DIAGNOSIS — I447 Left bundle-branch block, unspecified: Secondary | ICD-10-CM | POA: Insufficient documentation

## 2018-12-30 DIAGNOSIS — I2510 Atherosclerotic heart disease of native coronary artery without angina pectoris: Secondary | ICD-10-CM | POA: Insufficient documentation

## 2018-12-30 DIAGNOSIS — Z01812 Encounter for preprocedural laboratory examination: Secondary | ICD-10-CM | POA: Diagnosis not present

## 2018-12-30 DIAGNOSIS — M48061 Spinal stenosis, lumbar region without neurogenic claudication: Secondary | ICD-10-CM | POA: Diagnosis not present

## 2018-12-30 LAB — CBC
HCT: 49.7 % (ref 39.0–52.0)
Hemoglobin: 17.2 g/dL — ABNORMAL HIGH (ref 13.0–17.0)
MCH: 32.1 pg (ref 26.0–34.0)
MCHC: 34.6 g/dL (ref 30.0–36.0)
MCV: 92.9 fL (ref 80.0–100.0)
Platelets: 176 10*3/uL (ref 150–400)
RBC: 5.35 MIL/uL (ref 4.22–5.81)
RDW: 11.9 % (ref 11.5–15.5)
WBC: 6 10*3/uL (ref 4.0–10.5)
nRBC: 0 % (ref 0.0–0.2)

## 2018-12-30 LAB — TYPE AND SCREEN
ABO/RH(D): A POS
Antibody Screen: NEGATIVE

## 2018-12-30 LAB — BASIC METABOLIC PANEL
Anion gap: 10 (ref 5–15)
BUN: 31 mg/dL — ABNORMAL HIGH (ref 8–23)
CO2: 23 mmol/L (ref 22–32)
Calcium: 9.7 mg/dL (ref 8.9–10.3)
Chloride: 108 mmol/L (ref 98–111)
Creatinine, Ser: 1.73 mg/dL — ABNORMAL HIGH (ref 0.61–1.24)
GFR calc Af Amer: 44 mL/min — ABNORMAL LOW (ref >60)
GFR calc non Af Amer: 38 mL/min — ABNORMAL LOW (ref >60)
Glucose, Bld: 81 mg/dL (ref 70–99)
Potassium: 5 mmol/L (ref 3.5–5.1)
Sodium: 141 mmol/L (ref 135–145)

## 2018-12-30 LAB — SURGICAL PCR SCREEN
MRSA, PCR: NEGATIVE
Staphylococcus aureus: NEGATIVE

## 2018-12-30 NOTE — Progress Notes (Signed)
[id number] [place], [place] [address] Phone: [phone] Fax: [phone]      Your procedure is scheduled on Monday, October 19th.  Report to [facility] Main Entrance "A" at 8:45 A.M., and check in at the Admitting office.  Call this number if you have problems the morning of surgery:  [phone]  Call [phone] if you have any questions prior to your surgery date Monday-Friday 8am-4pm    Remember:  Do not eat or drink after midnight the night before your surgery     Take these medicines the morning of surgery with A SIP OF WATER   Tylenol - if needed    Amlodipine (Norvasc)  Omeprazole (Prilosec)  Follow your surgeon's instructions on when to stop Aspirin.  If no instructions were given by your surgeon then you will need to call the office to get those instructions.    7 days prior to surgery STOP taking any Aleve, Naproxen, Ibuprofen, Motrin, Advil, Goody's, BC's, all herbal medications, fish oil, and all vitamins.    The Morning of Surgery  Do not wear jewelry.  Do not wear lotions, powders, colognes, or deodorant  Men may shave face and neck.  Do not bring valuables to the hospital.  [facility] is not responsible for any belongings or valuables.  If you are a smoker, DO NOT Smoke 24 hours prior to surgery IF you wear a CPAP at night please bring your mask, tubing, and machine the morning of surgery   Remember that you must have someone to transport you home after your surgery, and remain with you for 24 hours if you are discharged the same day.   Contacts, glasses, hearing aids, dentures or bridgework may not be worn into surgery.    Leave your suitcase in the car.  After surgery it may be brought to your room.  For patients admitted to the hospital, discharge time will be determined by your treatment team.  Patients discharged the day of surgery will not be allowed to drive home.    Special  instructions:   [facility]- Preparing For Surgery  Before surgery, you can play an important role. Because skin is not sterile, your skin needs to be as free of germs as possible. You can reduce the number of germs on your skin by washing with CHG (chlorahexidine gluconate) Soap before surgery.  CHG is an antiseptic cleaner which kills germs and bonds with the skin to continue killing germs even after washing.    Oral Hygiene is also important to reduce your risk of infection.  Remember - BRUSH YOUR TEETH THE MORNING OF SURGERY WITH YOUR REGULAR TOOTHPASTE  Please do not use if you have an allergy to CHG or antibacterial soaps. If your skin becomes reddened/irritated stop using the CHG.  Do not shave (including legs and underarms) for at least 48 hours prior to first CHG shower. It is OK to shave your face.  Please follow these instructions carefully.   1. Shower the NIGHT BEFORE SURGERY and the MORNING OF SURGERY with CHG Soap.   2. If you chose to wash your hair, wash your hair first as usual with your normal shampoo.  3. After you shampoo, rinse your hair and body thoroughly to remove the shampoo.  4. Use CHG as you would any other liquid soap. You can apply CHG directly to the skin and wash gently with a scrungie or a clean washcloth.   5.  Apply the CHG Soap to your body ONLY FROM THE NECK DOWN.  Do not use on open wounds or open sores. Avoid contact with your eyes, ears, mouth and genitals (private parts). Wash Face and genitals (private parts)  with your normal soap.   6. Wash thoroughly, paying special attention to the area where your surgery will be performed.  7. Thoroughly rinse your body with warm water from the neck down.  8. DO NOT shower/wash with your normal soap after using and rinsing off the CHG Soap.  9. Pat yourself dry with a CLEAN TOWEL.  10. Wear CLEAN PAJAMAS to bed the night before surgery, wear comfortable clothes the morning of surgery  11. Place CLEAN  SHEETS on your bed the night of your first shower and DO NOT SLEEP WITH PETS.    Day of Surgery:  Do not apply any deodorants/lotions. Please shower the morning of surgery with the CHG soap  Please wear clean clothes to the hospital/surgery center.   Remember to brush your teeth WITH YOUR REGULAR TOOTHPASTE.   Please read over the following fact sheets that you were given.

## 2018-12-30 NOTE — Progress Notes (Signed)
PCP - Philip Aspen Cardiologist - P. Martinique  PPM/ICD - n/a Device Orders -  Rep Notified -   Chest x-ray - n/a EKG - 03/2018 Stress Test -  ECHO - 2015 Cardiac Cath - 2011  Sleep Study - stop Bang ?'s submitted to Dr. Philip Aspen CPAP -   Fasting Blood Sugar -  Checks Blood Sugar _____ times a day  Blood Thinner Instructions: Aspirin Instructions:pt. Is holding since 10/8  ERAS Protcol -n/a PRE-SURGERY Ensure or G2-   COVID TEST- on 10/15   Anesthesia review: yes for cardiac hx., Thoracic aorta aneurysm  Patient denies shortness of breath, fever, cough and chest pain at PAT appointment   All instructions explained to the patient, with a verbal understanding of the material. Patient agrees to go over the instructions while at home for a better understanding. Patient also instructed to self quarantine after being tested for COVID-19. The opportunity to ask questions was provided.

## 2018-12-30 NOTE — Progress Notes (Signed)
   12/30/18 1213  OBSTRUCTIVE SLEEP APNEA  Have you ever been diagnosed with sleep apnea through a sleep study? No  Do you snore loudly (loud enough to be heard through closed doors)?  1  Do you often feel tired, fatigued, or sleepy during the daytime (such as falling asleep during driving or talking to someone)? 1  Has anyone observed you stop breathing during your sleep? 1  Do you have, or are you being treated for high blood pressure? 1  BMI more than 35 kg/m2? 0  Age > 50 (1-yes) 1  Neck circumference greater than:Male 16 inches or larger, Male 17inches or larger? 0  Male Gender (Yes=1) 1  Obstructive Sleep Apnea Score 6

## 2018-12-31 NOTE — Progress Notes (Signed)
Anesthesia Chart Review: Follows with cardiology for chronic LBBB, minimal CAD by catheterization in 2011, thoracic aortic aneurysm. Pt last seen by Dr. Martinique 03/20/18. Per note, pt doing well, no changes the management. CTA in 04/2017 and showed a stable thoracic aortic aneurysm measuring 4.5cm in diameter with no acute changes. Unchanged on serial CT dating back to 2011. Frequency of CT has been reduced to every other year.   Stable CKD III, baseline creatinine ~1.6. Creatinine 1.73 on preop labs.  Chest CTA 04/29/17: IMPRESSION: Stable 4.5 cm fusiform ascending thoracic aortic aneurysm. No interval change. Ascending thoracic aortic aneurysm. Recommend semi-annual imaging followup by CTA or MRA and referral to cardiothoracic surgery if not already obtained.  TTE 2015: - Left ventricle: Abnormal septal motion. The cavity size was  normal. Wall thickness was increased in a pattern of moderate  LVH. Systolic function was normal. The estimated ejection  fraction was in the range of 50% to 55%. Doppler parameters are  consistent with abnormal left ventricular relaxation (grade 1  diastolic dysfunction).  - Atrial septum: No defect or patent foramen ovale was identified.    Wynonia Musty [facility]/Anesthesiology Phone [phone] 12/31/2018 12:53 PM

## 2018-12-31 NOTE — Anesthesia Preprocedure Evaluation (Addendum)
Anesthesia Evaluation  Patient identified by MRN, date of birth, ID band Patient awake    Reviewed: Allergy & Precautions, NPO status , Patient's Chart, lab work & pertinent test results  Airway Mallampati: I       Dental no notable dental hx. (+) Teeth Intact   Pulmonary    breath sounds clear to auscultation       Cardiovascular hypertension, Pt. on medications Normal cardiovascular exam Rhythm:Regular Rate:Normal     Neuro/Psych negative neurological ROS  negative psych ROS   GI/Hepatic Neg liver ROS, GERD  Medicated and Controlled,  Endo/Other    Renal/GU   negative genitourinary   Musculoskeletal   Abdominal Normal abdominal exam  (+)   Peds  Hematology negative hematology ROS (+)   Anesthesia Other Findings   Reproductive/Obstetrics                                                             Anesthesia Evaluation  Patient identified by MRN, date of birth, ID band Patient awake    Reviewed: Allergy & Precautions, H&P , NPO status , Patient's Chart, lab work & pertinent test results  Airway Mallampati: II   Neck ROM: full    Dental   Pulmonary shortness of breath,    breath sounds clear to auscultation       Cardiovascular hypertension, + CAD and + Peripheral Vascular Disease  + dysrhythmias  Rhythm:regular Rate:Normal  LBBB. Mild CAD.   Neuro/Psych    GI/Hepatic GERD  ,  Endo/Other    Renal/GU      Musculoskeletal  (+) Arthritis ,   Abdominal   Peds  Hematology   Anesthesia Other Findings   Reproductive/Obstetrics                             Anesthesia Physical Anesthesia Plan  ASA: III  Anesthesia Plan: General   Post-op Pain Management:    Induction: Intravenous  PONV Risk Score and Plan: 2 and Ondansetron, Treatment may vary due to age or medical condition and Midazolam  Airway Management Planned: Oral  ETT  Additional Equipment:   Intra-op Plan:   Post-operative Plan: Extubation in OR  Informed Consent: I have reviewed the patients History and Physical, chart, labs and discussed the procedure including the risks, benefits and alternatives for the proposed anesthesia with the patient or authorized representative who has indicated his/her understanding and acceptance.     Plan Discussed with: CRNA, Anesthesiologist and Surgeon  Anesthesia Plan Comments:        Anesthesia Quick Evaluation                                   Anesthesia Evaluation  Patient identified by MRN, date of birth, ID band Patient awake    Reviewed: Allergy & Precautions, H&P , NPO status , Patient's Chart, lab work & pertinent test results  Airway Mallampati: II   Neck ROM: full    Dental   Pulmonary shortness of breath,    breath sounds clear to auscultation       Cardiovascular hypertension, + CAD and + Peripheral Vascular Disease  + dysrhythmias  Rhythm:regular Rate:Normal  LBBB. Mild  CAD.   Neuro/Psych    GI/Hepatic GERD  ,  Endo/Other    Renal/GU      Musculoskeletal  (+) Arthritis ,   Abdominal   Peds  Hematology   Anesthesia Other Findings   Reproductive/Obstetrics                             Anesthesia Physical Anesthesia Plan  ASA: III  Anesthesia Plan: General   Post-op Pain Management:    Induction: Intravenous  PONV Risk Score and Plan: 2 and Ondansetron, Treatment may vary due to age or medical condition and Midazolam  Airway Management Planned: Oral ETT  Additional Equipment:   Intra-op Plan:   Post-operative Plan: Extubation in OR  Informed Consent: I have reviewed the patients History and Physical, chart, labs and discussed the procedure including the risks, benefits and alternatives for the proposed anesthesia with the patient or authorized representative who has indicated his/her understanding and  acceptance.     Plan Discussed with: CRNA, Anesthesiologist and Surgeon  Anesthesia Plan Comments:        Anesthesia Quick Evaluation  Anesthesia Physical Anesthesia Plan  ASA: II  Anesthesia Plan: General   Post-op Pain Management:    Induction: Intravenous  PONV Risk Score and Plan: 3 and Ondansetron and Dexamethasone  Airway Management Planned: Oral ETT  Additional Equipment: None  Intra-op Plan:   Post-operative Plan: Extubation in OR  Informed Consent: I have reviewed the patients History and Physical, chart, labs and discussed the procedure including the risks, benefits and alternatives for the proposed anesthesia with the patient or authorized representative who has indicated his/her understanding and acceptance.     Dental advisory given  Plan Discussed with: CRNA  Anesthesia Plan Comments: (Follows with cardiology for chronic LBBB, minimal CAD by catheterization in 2011, thoracic aortic aneurysm. Pt last seen by Dr. Martinique 03/20/18. Per note, pt doing well, no changes the management. CTA in 04/2017 and showed a stable thoracic aortic aneurysm measuring 4.5cm in diameter with no acute changes. Unchanged on serial CT dating back to 2011. Frequency of CT has been reduced to every other year.   Stable CKD III, baseline creatinine ~1.6. Creatinine 1.73 on preop labs.  Chest CTA 04/29/17: IMPRESSION: Stable 4.5 cm fusiform ascending thoracic aortic aneurysm. No interval change. Ascending thoracic aortic aneurysm. Recommend semi-annual imaging followup by CTA or MRA and referral to cardiothoracic surgery if not already obtained.  TTE 2015: - Left ventricle: Abnormal septal motion. The cavity size was  normal. Wall thickness was increased in a pattern of moderate  LVH. Systolic function was normal. The estimated ejection  fraction was in the range of 50% to 55%. Doppler parameters are  consistent with abnormal left ventricular relaxation (grade 1  diastolic  dysfunction).  - Atrial septum: No defect or patent foramen ovale was identified.   )      Anesthesia Quick Evaluation

## 2019-01-01 ENCOUNTER — Other Ambulatory Visit (HOSPITAL_COMMUNITY): Payer: Medicare Other

## 2019-01-01 ENCOUNTER — Other Ambulatory Visit (HOSPITAL_COMMUNITY)
Admission: RE | Admit: 2019-01-01 | Discharge: 2019-01-01 | Disposition: A | Discharge status: Home / Self Care | Payer: Medicare Other | Source: Ambulatory Visit | Attending: Neurosurgery | Admitting: Neurosurgery

## 2019-01-01 DIAGNOSIS — Z20828 Contact with and (suspected) exposure to other viral communicable diseases: Secondary | ICD-10-CM | POA: Diagnosis not present

## 2019-01-01 DIAGNOSIS — Z01812 Encounter for preprocedural laboratory examination: Secondary | ICD-10-CM | POA: Diagnosis not present

## 2019-01-03 LAB — NOVEL CORONAVIRUS, NAA (HOSP ORDER, SEND-OUT TO REF LAB; TAT 18-24 HRS): SARS-CoV-2, NAA: NOT DETECTED

## 2019-01-05 ENCOUNTER — Inpatient Hospital Stay (HOSPITAL_COMMUNITY): Payer: Medicare Other | Admitting: Certified Registered"

## 2019-01-05 ENCOUNTER — Inpatient Hospital Stay (HOSPITAL_COMMUNITY): Payer: Medicare Other | Admitting: Physician Assistant

## 2019-01-05 ENCOUNTER — Inpatient Hospital Stay (HOSPITAL_COMMUNITY)
Admission: RE | Admit: 2019-01-05 | Discharge: 2019-01-06 | DRG: 455 | Disposition: A | Discharge status: Home / Self Care | Payer: Medicare Other | Attending: Neurosurgery | Admitting: Neurosurgery

## 2019-01-05 ENCOUNTER — Encounter (HOSPITAL_COMMUNITY): Payer: Self-pay | Admitting: *Deleted

## 2019-01-05 ENCOUNTER — Inpatient Hospital Stay (HOSPITAL_COMMUNITY): Payer: Medicare Other

## 2019-01-05 ENCOUNTER — Encounter (HOSPITAL_COMMUNITY)
Admission: RE | Disposition: A | Discharge status: Home / Self Care | Payer: Self-pay | Source: Home / Self Care | Attending: Neurosurgery

## 2019-01-05 DIAGNOSIS — Z981 Arthrodesis status: Secondary | ICD-10-CM

## 2019-01-05 DIAGNOSIS — Z79899 Other long term (current) drug therapy: Secondary | ICD-10-CM | POA: Diagnosis not present

## 2019-01-05 DIAGNOSIS — Z7982 Long term (current) use of aspirin: Secondary | ICD-10-CM | POA: Diagnosis not present

## 2019-01-05 DIAGNOSIS — Z8249 Family history of ischemic heart disease and other diseases of the circulatory system: Secondary | ICD-10-CM | POA: Diagnosis not present

## 2019-01-05 DIAGNOSIS — Z87442 Personal history of urinary calculi: Secondary | ICD-10-CM

## 2019-01-05 DIAGNOSIS — Z85820 Personal history of malignant melanoma of skin: Secondary | ICD-10-CM

## 2019-01-05 DIAGNOSIS — M48062 Spinal stenosis, lumbar region with neurogenic claudication: Secondary | ICD-10-CM | POA: Diagnosis not present

## 2019-01-05 DIAGNOSIS — N183 Chronic kidney disease, stage 3 unspecified: Secondary | ICD-10-CM | POA: Diagnosis not present

## 2019-01-05 DIAGNOSIS — Z85528 Personal history of other malignant neoplasm of kidney: Secondary | ICD-10-CM

## 2019-01-05 DIAGNOSIS — Z79890 Hormone replacement therapy (postmenopausal): Secondary | ICD-10-CM | POA: Diagnosis not present

## 2019-01-05 DIAGNOSIS — Z885 Allergy status to narcotic agent status: Secondary | ICD-10-CM

## 2019-01-05 DIAGNOSIS — I10 Essential (primary) hypertension: Secondary | ICD-10-CM | POA: Diagnosis present

## 2019-01-05 DIAGNOSIS — K219 Gastro-esophageal reflux disease without esophagitis: Secondary | ICD-10-CM | POA: Diagnosis present

## 2019-01-05 DIAGNOSIS — M5116 Intervertebral disc disorders with radiculopathy, lumbar region: Secondary | ICD-10-CM | POA: Diagnosis present

## 2019-01-05 DIAGNOSIS — Z419 Encounter for procedure for purposes other than remedying health state, unspecified: Secondary | ICD-10-CM

## 2019-01-05 DIAGNOSIS — I2510 Atherosclerotic heart disease of native coronary artery without angina pectoris: Secondary | ICD-10-CM | POA: Diagnosis present

## 2019-01-05 DIAGNOSIS — I129 Hypertensive chronic kidney disease with stage 1 through stage 4 chronic kidney disease, or unspecified chronic kidney disease: Secondary | ICD-10-CM | POA: Diagnosis not present

## 2019-01-05 SURGERY — POSTERIOR LUMBAR FUSION 2 LEVEL
Anesthesia: General | Site: Spine Lumbar

## 2019-01-05 SURGERY — LAPAROTOMY, EXPLORATORY
Anesthesia: General

## 2019-01-05 MED ORDER — DEXAMETHASONE SODIUM PHOSPHATE 10 MG/ML IJ SOLN
INTRAMUSCULAR | Status: DC | PRN
  Administered 2019-01-05: 5 mg via INTRAVENOUS

## 2019-01-05 MED ORDER — 0.9 % SODIUM CHLORIDE (POUR BTL) OPTIME
TOPICAL | Status: DC | PRN
  Administered 2019-01-05: 1000 mL

## 2019-01-05 MED ORDER — PHENYLEPHRINE 40 MCG/ML (10ML) SYRINGE FOR IV PUSH (FOR BLOOD PRESSURE SUPPORT)
PREFILLED_SYRINGE | INTRAVENOUS | Status: DC | PRN
  Administered 2019-01-05 (×5): 80 ug via INTRAVENOUS

## 2019-01-05 MED ORDER — ROCURONIUM BROMIDE 10 MG/ML (PF) SYRINGE
PREFILLED_SYRINGE | INTRAVENOUS | Status: AC
  Filled 2019-01-05: qty 10

## 2019-01-05 MED ORDER — ACETAMINOPHEN 650 MG RE SUPP: 650.0000 mg | RECTAL | Status: DC | PRN

## 2019-01-05 MED ORDER — SODIUM CHLORIDE 0.9 % IV SOLN: 250.0000 mL | INTRAVENOUS | Status: DC

## 2019-01-05 MED ORDER — ROCURONIUM BROMIDE 10 MG/ML (PF) SYRINGE
PREFILLED_SYRINGE | INTRAVENOUS | Status: DC | PRN
  Administered 2019-01-05: 60 mg via INTRAVENOUS
  Administered 2019-01-05 (×3): 10 mg via INTRAVENOUS
  Administered 2019-01-05: 20 mg via INTRAVENOUS
  Administered 2019-01-05 (×2): 10 mg via INTRAVENOUS

## 2019-01-05 MED ORDER — MEPERIDINE HCL 25 MG/ML IJ SOLN: 6.2500 mg | INTRAMUSCULAR | Status: DC | PRN

## 2019-01-05 MED ORDER — AMLODIPINE BESYLATE 5 MG PO TABS
5.0000 mg | ORAL_TABLET | Freq: Every day | ORAL | Status: DC
  Administered 2019-01-06: 5 mg via ORAL
  Filled 2019-01-05: qty 1

## 2019-01-05 MED ORDER — CYCLOBENZAPRINE HCL 10 MG PO TABS
ORAL_TABLET | ORAL | Status: AC
  Filled 2019-01-05: qty 1

## 2019-01-05 MED ORDER — CHLORHEXIDINE GLUCONATE CLOTH 2 % EX PADS: 6.0000 | MEDICATED_PAD | Freq: Once | CUTANEOUS | Status: DC

## 2019-01-05 MED ORDER — FENTANYL CITRATE (PF) 250 MCG/5ML IJ SOLN
INTRAMUSCULAR | Status: DC | PRN
  Administered 2019-01-05 (×3): 50 ug via INTRAVENOUS

## 2019-01-05 MED ORDER — PROMETHAZINE HCL 25 MG/ML IJ SOLN: 6.2500 mg | INTRAMUSCULAR | Status: DC | PRN

## 2019-01-05 MED ORDER — LIDOCAINE 2% (20 MG/ML) 5 ML SYRINGE
INTRAMUSCULAR | Status: AC
  Filled 2019-01-05: qty 5

## 2019-01-05 MED ORDER — PRAVASTATIN SODIUM 40 MG PO TABS
40.0000 mg | ORAL_TABLET | Freq: Every day | ORAL | Status: DC
  Administered 2019-01-05: 40 mg via ORAL
  Filled 2019-01-05: qty 1

## 2019-01-05 MED ORDER — THROMBIN 5000 UNITS EX SOLR
CUTANEOUS | Status: AC
  Filled 2019-01-05: qty 5000

## 2019-01-05 MED ORDER — ONDANSETRON HCL 4 MG/2ML IJ SOLN
4.0000 mg | Freq: Four times a day (QID) | INTRAMUSCULAR | Status: DC | PRN
  Filled 2019-01-05: qty 2

## 2019-01-05 MED ORDER — CEFAZOLIN SODIUM-DEXTROSE 2-4 GM/100ML-% IV SOLN
INTRAVENOUS | Status: AC
  Filled 2019-01-05: qty 100

## 2019-01-05 MED ORDER — FENTANYL CITRATE (PF) 100 MCG/2ML IJ SOLN
25.0000 ug | INTRAMUSCULAR | Status: DC | PRN
  Administered 2019-01-05: 50 ug via INTRAVENOUS
  Administered 2019-01-05 (×2): 25 ug via INTRAVENOUS

## 2019-01-05 MED ORDER — CYCLOBENZAPRINE HCL 10 MG PO TABS
10.0000 mg | ORAL_TABLET | Freq: Three times a day (TID) | ORAL | Status: DC | PRN
  Administered 2019-01-05 – 2019-01-06 (×2): 10 mg via ORAL
  Filled 2019-01-05: qty 1

## 2019-01-05 MED ORDER — PROPOFOL 10 MG/ML IV BOLUS
INTRAVENOUS | Status: DC | PRN
  Administered 2019-01-05: 150 mg via INTRAVENOUS

## 2019-01-05 MED ORDER — ACETAMINOPHEN 325 MG PO TABS: 650.0000 mg | ORAL_TABLET | ORAL | Status: DC | PRN

## 2019-01-05 MED ORDER — PHENYLEPHRINE 40 MCG/ML (10ML) SYRINGE FOR IV PUSH (FOR BLOOD PRESSURE SUPPORT)
PREFILLED_SYRINGE | INTRAVENOUS | Status: AC
  Filled 2019-01-05: qty 10

## 2019-01-05 MED ORDER — LACTATED RINGERS IV SOLN
INTRAVENOUS | Status: DC | PRN
  Administered 2019-01-05 (×2): via INTRAVENOUS

## 2019-01-05 MED ORDER — BUPIVACAINE LIPOSOME 1.3 % IJ SUSP
INTRAMUSCULAR | Status: DC | PRN
  Administered 2019-01-05: 20 mL

## 2019-01-05 MED ORDER — SODIUM CHLORIDE 0.9 % IV SOLN
INTRAVENOUS | Status: DC | PRN
  Administered 2019-01-05: 500 mL

## 2019-01-05 MED ORDER — MENTHOL 3 MG MT LOZG
1.0000 | LOZENGE | OROMUCOSAL | Status: DC | PRN
  Filled 2019-01-05: qty 9

## 2019-01-05 MED ORDER — OXYCODONE HCL 5 MG PO TABS
10.0000 mg | ORAL_TABLET | ORAL | Status: DC | PRN
  Administered 2019-01-05 – 2019-01-06 (×5): 10 mg via ORAL
  Filled 2019-01-05 (×4): qty 2

## 2019-01-05 MED ORDER — ONDANSETRON HCL 4 MG PO TABS: 4.0000 mg | ORAL_TABLET | Freq: Four times a day (QID) | ORAL | Status: DC | PRN

## 2019-01-05 MED ORDER — DOCUSATE SODIUM 100 MG PO CAPS
100.0000 mg | ORAL_CAPSULE | Freq: Two times a day (BID) | ORAL | Status: DC
  Administered 2019-01-05 – 2019-01-06 (×3): 100 mg via ORAL
  Filled 2019-01-05 (×2): qty 1

## 2019-01-05 MED ORDER — LIDOCAINE 2% (20 MG/ML) 5 ML SYRINGE
INTRAMUSCULAR | Status: DC | PRN
  Administered 2019-01-05: 100 mg via INTRAVENOUS

## 2019-01-05 MED ORDER — ACETAMINOPHEN 500 MG PO TABS
1000.0000 mg | ORAL_TABLET | Freq: Four times a day (QID) | ORAL | Status: DC
  Administered 2019-01-05 – 2019-01-06 (×3): 1000 mg via ORAL
  Filled 2019-01-05 (×3): qty 2

## 2019-01-05 MED ORDER — SODIUM CHLORIDE 0.9% FLUSH
3.0000 mL | Freq: Two times a day (BID) | INTRAVENOUS | Status: DC
  Administered 2019-01-05: 3 mL via INTRAVENOUS

## 2019-01-05 MED ORDER — LOSARTAN POTASSIUM 50 MG PO TABS
100.0000 mg | ORAL_TABLET | Freq: Every day | ORAL | Status: DC
  Administered 2019-01-05 – 2019-01-06 (×2): 100 mg via ORAL
  Filled 2019-01-05 (×2): qty 2

## 2019-01-05 MED ORDER — OXYCODONE HCL 5 MG PO TABS
ORAL_TABLET | ORAL | Status: AC
  Filled 2019-01-05: qty 2

## 2019-01-05 MED ORDER — CEFAZOLIN SODIUM-DEXTROSE 2-4 GM/100ML-% IV SOLN
2.0000 g | Freq: Three times a day (TID) | INTRAVENOUS | Status: AC
  Administered 2019-01-05 – 2019-01-06 (×2): 2 g via INTRAVENOUS
  Filled 2019-01-05 (×2): qty 100

## 2019-01-05 MED ORDER — ALPRAZOLAM 0.25 MG PO TABS
0.2500 mg | ORAL_TABLET | Freq: Every day | ORAL | Status: DC
  Administered 2019-01-05: 0.25 mg via ORAL
  Filled 2019-01-05: qty 1

## 2019-01-05 MED ORDER — FENTANYL CITRATE (PF) 250 MCG/5ML IJ SOLN
INTRAMUSCULAR | Status: AC
  Filled 2019-01-05: qty 5

## 2019-01-05 MED ORDER — KETOROLAC TROMETHAMINE 30 MG/ML IJ SOLN: 30.0000 mg | Freq: Once | INTRAMUSCULAR | Status: DC | PRN

## 2019-01-05 MED ORDER — MORPHINE SULFATE (PF) 4 MG/ML IV SOLN
4.0000 mg | INTRAVENOUS | Status: DC | PRN
  Administered 2019-01-05 (×2): 4 mg via INTRAVENOUS
  Filled 2019-01-05 (×3): qty 1

## 2019-01-05 MED ORDER — ONDANSETRON HCL 4 MG/2ML IJ SOLN
INTRAMUSCULAR | Status: DC | PRN
  Administered 2019-01-05: 4 mg via INTRAVENOUS

## 2019-01-05 MED ORDER — LACTATED RINGERS IV SOLN
INTRAVENOUS | Status: DC
  Administered 2019-01-05 (×2): via INTRAVENOUS

## 2019-01-05 MED ORDER — ONDANSETRON HCL 4 MG/2ML IJ SOLN
INTRAMUSCULAR | Status: AC
  Filled 2019-01-05: qty 2

## 2019-01-05 MED ORDER — CEFAZOLIN SODIUM-DEXTROSE 2-4 GM/100ML-% IV SOLN
2.0000 g | INTRAVENOUS | Status: AC
  Administered 2019-01-05: 2 g via INTRAVENOUS

## 2019-01-05 MED ORDER — SODIUM CHLORIDE 0.9% FLUSH: 3.0000 mL | INTRAVENOUS | Status: DC | PRN

## 2019-01-05 MED ORDER — OXYCODONE HCL 5 MG PO TABS: 5.0000 mg | ORAL_TABLET | ORAL | Status: DC | PRN

## 2019-01-05 MED ORDER — PROPOFOL 10 MG/ML IV BOLUS
INTRAVENOUS | Status: AC
  Filled 2019-01-05: qty 20

## 2019-01-05 MED ORDER — BISACODYL 10 MG RE SUPP: 10.0000 mg | Freq: Every day | RECTAL | Status: DC | PRN

## 2019-01-05 MED ORDER — POTASSIUM CITRATE ER 10 MEQ (1080 MG) PO TBCR
20.0000 meq | EXTENDED_RELEASE_TABLET | Freq: Two times a day (BID) | ORAL | Status: DC
  Administered 2019-01-06: 20 meq via ORAL
  Filled 2019-01-05 (×3): qty 2

## 2019-01-05 MED ORDER — FENTANYL CITRATE (PF) 100 MCG/2ML IJ SOLN
INTRAMUSCULAR | Status: AC
  Filled 2019-01-05: qty 2

## 2019-01-05 MED ORDER — BACITRACIN ZINC 500 UNIT/GM EX OINT
TOPICAL_OINTMENT | CUTANEOUS | Status: DC | PRN
  Administered 2019-01-05: 1 via TOPICAL

## 2019-01-05 MED ORDER — PANTOPRAZOLE SODIUM 40 MG PO TBEC
40.0000 mg | DELAYED_RELEASE_TABLET | Freq: Every day | ORAL | Status: DC
  Administered 2019-01-06: 40 mg via ORAL
  Filled 2019-01-05: qty 1

## 2019-01-05 MED ORDER — BUPIVACAINE-EPINEPHRINE (PF) 0.5% -1:200000 IJ SOLN
INTRAMUSCULAR | Status: DC | PRN
  Administered 2019-01-05: 7 mL

## 2019-01-05 MED ORDER — PHENYLEPHRINE 40 MCG/ML (10ML) SYRINGE FOR IV PUSH (FOR BLOOD PRESSURE SUPPORT)
PREFILLED_SYRINGE | INTRAVENOUS | Status: AC
  Filled 2019-01-05: qty 20

## 2019-01-05 MED ORDER — BACITRACIN ZINC 500 UNIT/GM EX OINT
TOPICAL_OINTMENT | CUTANEOUS | Status: AC
  Filled 2019-01-05: qty 28.35

## 2019-01-05 MED ORDER — SODIUM CHLORIDE 0.9 % IV SOLN
INTRAVENOUS | Status: DC | PRN
  Administered 2019-01-05: 40 ug/min via INTRAVENOUS

## 2019-01-05 MED ORDER — PHENOL 1.4 % MT LIQD: 1.0000 | OROMUCOSAL | Status: DC | PRN

## 2019-01-05 MED ORDER — SUGAMMADEX SODIUM 200 MG/2ML IV SOLN
INTRAVENOUS | Status: DC | PRN
  Administered 2019-01-05: 180 mg via INTRAVENOUS

## 2019-01-05 MED ORDER — TAMSULOSIN HCL 0.4 MG PO CAPS
0.4000 mg | ORAL_CAPSULE | Freq: Every day | ORAL | Status: DC
  Administered 2019-01-05 (×2): 0.4 mg via ORAL
  Filled 2019-01-05 (×2): qty 1

## 2019-01-05 MED ORDER — BUPIVACAINE-EPINEPHRINE (PF) 0.5% -1:200000 IJ SOLN
INTRAMUSCULAR | Status: AC
  Filled 2019-01-05: qty 30

## 2019-01-05 MED ORDER — THROMBIN 5000 UNITS EX SOLR
OROMUCOSAL | Status: DC | PRN
  Administered 2019-01-05: 5 mL
  Administered 2019-01-05: 12:00:00
  Administered 2019-01-05: 5 mL

## 2019-01-05 SURGICAL SUPPLY — 86 items
ADH SKN CLS APL DERMABOND .7 (GAUZE/BANDAGES/DRESSINGS) ×1
APL SKNCLS STERI-STRIP NONHPOA (GAUZE/BANDAGES/DRESSINGS) ×1
BAG DECANTER FOR FLEXI CONT (MISCELLANEOUS) ×2 IMPLANT
BENZOIN TINCTURE PRP APPL 2/3 (GAUZE/BANDAGES/DRESSINGS) ×2 IMPLANT
BLADE CLIPPER SURG (BLADE) IMPLANT
BUR MATCHSTICK NEURO 3.0 LAGG (BURR) ×2 IMPLANT
BUR PRECISION FLUTE 6.0 (BURR) ×2 IMPLANT
CAGE ALTERA 10X31X10-14 15D (Cage) ×2 IMPLANT
CANISTER SUCT 3000ML PPV (MISCELLANEOUS) ×2 IMPLANT
CAP LCK SPNE (Orthopedic Implant) ×4 IMPLANT
CAP LOCK SPINE RADIUS (Orthopedic Implant) ×4 IMPLANT
CAP LOCKING (Orthopedic Implant) ×8 IMPLANT
CARTRIDGE OIL MAESTRO DRILL (MISCELLANEOUS) ×1 IMPLANT
CONT SPEC 4OZ CLIKSEAL STRL BL (MISCELLANEOUS) ×2 IMPLANT
COVER BACK TABLE 60X90IN (DRAPES) ×2 IMPLANT
COVER WAND RF STERILE (DRAPES) ×2 IMPLANT
DECANTER SPIKE VIAL GLASS SM (MISCELLANEOUS) ×2 IMPLANT
DERMABOND ADVANCED (GAUZE/BANDAGES/DRESSINGS) ×1
DERMABOND ADVANCED .7 DNX12 (GAUZE/BANDAGES/DRESSINGS) ×1 IMPLANT
DIFFUSER DRILL AIR PNEUMATIC (MISCELLANEOUS) ×2 IMPLANT
DRAPE C-ARM 42X72 X-RAY (DRAPES) ×6 IMPLANT
DRAPE HALF SHEET 40X57 (DRAPES) ×2 IMPLANT
DRAPE LAPAROTOMY 100X72X124 (DRAPES) ×2 IMPLANT
DRAPE SURG 17X23 STRL (DRAPES) ×8 IMPLANT
DRSG OPSITE POSTOP 4X6 (GAUZE/BANDAGES/DRESSINGS) ×2 IMPLANT
ELECT BLADE 4.0 EZ CLEAN MEGAD (MISCELLANEOUS) ×2
ELECT REM PT RETURN 9FT ADLT (ELECTROSURGICAL) ×2
ELECTRODE BLDE 4.0 EZ CLN MEGD (MISCELLANEOUS) ×1 IMPLANT
ELECTRODE REM PT RTRN 9FT ADLT (ELECTROSURGICAL) ×1 IMPLANT
GAUZE 4X4 16PLY RFD (DISPOSABLE) ×2 IMPLANT
GAUZE SPONGE 4X4 12PLY STRL (GAUZE/BANDAGES/DRESSINGS) ×2 IMPLANT
GLOVE BIO SURGEON STRL SZ 6.5 (GLOVE) ×4 IMPLANT
GLOVE BIO SURGEON STRL SZ8 (GLOVE) ×4 IMPLANT
GLOVE BIO SURGEON STRL SZ8.5 (GLOVE) ×4 IMPLANT
GLOVE BIOGEL PI IND STRL 6 (GLOVE) ×2 IMPLANT
GLOVE BIOGEL PI IND STRL 6.5 (GLOVE) ×3 IMPLANT
GLOVE BIOGEL PI IND STRL 7.0 (GLOVE) ×1 IMPLANT
GLOVE BIOGEL PI IND STRL 7.5 (GLOVE) ×2 IMPLANT
GLOVE BIOGEL PI IND STRL 8 (GLOVE) ×1 IMPLANT
GLOVE BIOGEL PI INDICATOR 6 (GLOVE) ×2
GLOVE BIOGEL PI INDICATOR 6.5 (GLOVE) ×3
GLOVE BIOGEL PI INDICATOR 7.0 (GLOVE) ×1
GLOVE BIOGEL PI INDICATOR 7.5 (GLOVE) ×2
GLOVE BIOGEL PI INDICATOR 8 (GLOVE) ×1
GLOVE ECLIPSE 7.5 STRL STRAW (GLOVE) ×2 IMPLANT
GLOVE EXAM NITRILE XL STR (GLOVE) IMPLANT
GLOVE SS N UNI LF 6.5 STRL (GLOVE) ×8 IMPLANT
GLOVE SURG SS PI 6.0 STRL IVOR (GLOVE) ×4 IMPLANT
GLOVE SURG SS PI 7.5 STRL IVOR (GLOVE) ×10 IMPLANT
GOWN STRL REUS W/ TWL LRG LVL3 (GOWN DISPOSABLE) ×5 IMPLANT
GOWN STRL REUS W/ TWL XL LVL3 (GOWN DISPOSABLE) ×3 IMPLANT
GOWN STRL REUS W/TWL 2XL LVL3 (GOWN DISPOSABLE) IMPLANT
GOWN STRL REUS W/TWL LRG LVL3 (GOWN DISPOSABLE) ×5
GOWN STRL REUS W/TWL XL LVL3 (GOWN DISPOSABLE) ×6
HEMOSTAT POWDER KIT SURGIFOAM (HEMOSTASIS) ×6 IMPLANT
KIT BASIN OR (CUSTOM PROCEDURE TRAY) ×2 IMPLANT
KIT TURNOVER KIT B (KITS) ×2 IMPLANT
MILL MEDIUM DISP (BLADE) ×2 IMPLANT
NEEDLE HYPO 21X1.5 SAFETY (NEEDLE) ×2 IMPLANT
NEEDLE HYPO 22GX1.5 SAFETY (NEEDLE) ×2 IMPLANT
NS IRRIG 1000ML POUR BTL (IV SOLUTION) ×2 IMPLANT
OIL CARTRIDGE MAESTRO DRILL (MISCELLANEOUS) ×2
PACK LAMINECTOMY NEURO (CUSTOM PROCEDURE TRAY) ×2 IMPLANT
PAD ARMBOARD 7.5X6 YLW CONV (MISCELLANEOUS) ×6 IMPLANT
PATTIES SURGICAL .5 X.5 (GAUZE/BANDAGES/DRESSINGS) ×2 IMPLANT
PATTIES SURGICAL .5 X1 (DISPOSABLE) IMPLANT
PATTIES SURGICAL 1X1 (DISPOSABLE) ×4 IMPLANT
PUTTY DBM 10CC CALC GRAN (Putty) ×4 IMPLANT
ROD 110MM (Rod) ×4 IMPLANT
ROD SPNL 110X5.5XNS TI RDS (Rod) ×2 IMPLANT
SCREW 7.75X50MM (Screw) ×8 IMPLANT
SPACER ALTERA 10X31 8-12MM-8 (Spacer) ×2 IMPLANT
SPONGE LAP 4X18 RFD (DISPOSABLE) ×2 IMPLANT
SPONGE NEURO XRAY DETECT 1X3 (DISPOSABLE) IMPLANT
SPONGE SURGIFOAM ABS GEL 100 (HEMOSTASIS) IMPLANT
STRIP CLOSURE SKIN 1/2X4 (GAUZE/BANDAGES/DRESSINGS) ×2 IMPLANT
SUT VIC AB 1 CT1 18XBRD ANBCTR (SUTURE) ×3 IMPLANT
SUT VIC AB 1 CT1 8-18 (SUTURE) ×6
SUT VIC AB 2-0 CP2 18 (SUTURE) ×4 IMPLANT
SWAB COLLECTION DEVICE MRSA (MISCELLANEOUS) ×2 IMPLANT
SWAB CULTURE ESWAB REG 1ML (MISCELLANEOUS) ×2 IMPLANT
SYR 20ML LL LF (SYRINGE) ×2 IMPLANT
TOWEL GREEN STERILE (TOWEL DISPOSABLE) ×2 IMPLANT
TOWEL GREEN STERILE FF (TOWEL DISPOSABLE) ×2 IMPLANT
TRAY FOLEY MTR SLVR 16FR STAT (SET/KITS/TRAYS/PACK) ×2 IMPLANT
WATER STERILE IRR 1000ML POUR (IV SOLUTION) ×2 IMPLANT

## 2019-01-05 NOTE — Op Note (Signed)
Brief history: The patient is a 73 year old white male who has had previous lumbar fusions at L4-5 and L5-S1.  He has developed recurrent back and leg pain consistent with neurogenic claudication.  He has failed medical management and was worked up with a lumbar MRI which demonstrated degenerative disc disease and spinal stenosis at L2-3 and L3-4.  I discussed the various treatment options with him.  He has decided to proceed with surgery after weighing the risks, benefits and alternatives.  Preoperative diagnosis: L2-3 and L3-4 spinal stenosis, degenerative disc disease, spinal stenosis compressing the L2, L3 and L4 nerve roots; lumbago; lumbar radiculopathy; neurogenic claudication  Postoperative diagnosis: The same  Procedure: Bilateral L2-3 and L3-4 laminotomy/foraminotomies/medial facetectomy to decompress the bilateral L2, L3 and L4 nerve roots(the work required to do this was in addition to the work required to do the posterior lumbar interbody fusion because of the patient's spinal stenosis, facet arthropathy. Etc. requiring a wide decompression of the nerve roots.);  L2-3 and L3-4 transforaminal lumbar interbody fusion with local morselized autograft bone and Zimmer DBM; insertion of interbody prosthesis at L2-3 and L3-4 (globus peek expandable interbody prosthesis); posterior segmental instrumentation from L2 to the sacrum with Stryker titanium pedicle screws and rods; posterior lateral arthrodesis at L2-3 and L3-4 with local morselized autograft bone and Zimmer DBM; exploration of lumbar fusion/removal of lumbar hardware  Surgeon: Dr. Earle Gell  Asst.: Dr. Jovita Gamma and Arnetha Massy nurse practitioner  Anesthesia: Gen. endotracheal  Estimated blood loss: 300 cc  Drains: None  Complications: None  Description of procedure: The patient was brought to the operating room by the anesthesia team. General endotracheal anesthesia was induced. The patient was turned to the prone  position on the Wilson frame. The patient's lumbosacral region was then prepared with Betadine scrub and Betadine solution. Sterile drapes were applied.  I then injected the area to be incised with Marcaine with epinephrine solution. I then used the scalpel to make a linear midline incision over the L2-3, L3-4 and L4-5 interspace, incising through the old surgical scar. I then used electrocautery to perform a bilateral subperiosteal dissection exposing the spinous process and lamina of L2 to the upper sacrum, and exposing the old hardware at L4-5 and L5-S1. We then inserted the Verstrac retractor to provide exposure.  We explored the fusion by removing the caps from the old screws and then removing the rods.  The arthrodesis at L4-5 and L5-S1 appeared solid.  I began the decompression by using the high speed drill to perform laminotomies at L2-3 and L5 3 4 bilaterally. We then used the Kerrison punches to widen the laminotomy and removed the ligamentum flavum at L2-3 and L3-4 bilaterally. We used the Kerrison punches to remove the medial facets at L2-3 and L3-4 bilaterally. We performed wide foraminotomies about the bilateral L2, L3 and L4 nerve roots completing the decompression.  We now turned our attention to the posterior lumbar interbody fusion. I used a scalpel to incise the intervertebral disc at L2-3 and L3-4 bilaterally. I then performed a partial intervertebral discectomy at L2-3 and L3-4 bilaterally using the pituitary forceps. We prepared the vertebral endplates at X33443 and X33443 bilaterally for the fusion by removing the soft tissues with the curettes.  The disc at L2-3 was quite degenerated and the endplates were quite eroded.  We therefore obtained a culture from the L2-3 disc space, although I did not see any clear infection.  We then used the trial spacers to pick the appropriate sized  interbody prosthesis. We prefilled his prosthesis with a combination of local morselized autograft bone that  we obtained during the decompression as well as Zimmer DBM. We inserted the prefilled prosthesis into the interspace at L2-3 and L3-4, we then turned and expanded the prosthesis. There was a good snug fit of the prosthesis in the interspace. We then filled and the remainder of the intervertebral disc space with local morselized autograft bone and Zimmer DBM. This completed the posterior lumbar interbody arthrodesis.  We now turned attention to the instrumentation. Under fluoroscopic guidance we cannulated the bilateral L2 and L3 pedicles with the bone probe. We then removed the bone probe. We then tapped the pedicle with a 7.25 millimeter tap. We then removed the tap. We probed inside the tapped pedicle with a ball probe to rule out cortical breaches. We then inserted a 7.75 x 50 millimeter pedicle screw into the L2 and L3 pedicles bilaterally under fluoroscopic guidance. We then palpated along the medial aspect of the pedicles to rule out cortical breaches. There were none. The nerve roots were not injured. We then connected the unilateral pedicle screws from L2 to the sacrum bilaterally with a lordotic rod. We compressed the construct and secured the rod in place with the caps. We then tightened the caps appropriately. This completed the instrumentation from L2 to the sacrum bilaterally.  We now turned our attention to the posterior lateral arthrodesis at L2-3 and L3-4. We used the high-speed drill to decorticate the remainder of the facets, pars, transverse process at L2-3 and L3-4. We then applied a combination of local morselized autograft bone and Zimmer DBM over these decorticated posterior lateral structures. This completed the posterior lateral arthrodesis.  We then obtained hemostasis using bipolar electrocautery. We irrigated the wound out with bacitracin solution. We inspected the thecal sac and nerve roots and noted they were well decompressed. We then removed the retractor.  We injected Exparel .  We reapproximated patient's thoracolumbar fascia with interrupted #1 Vicryl suture. We reapproximated patient's subcutaneous tissue with interrupted 2-0 Vicryl suture. The reapproximated patient's skin with Steri-Strips and benzoin. The wound was then coated with bacitracin ointment. A sterile dressing was applied. The drapes were removed. The patient was subsequently returned to the supine position where they were extubated by the anesthesia team. He was then transported to the post anesthesia care unit in stable condition. All sponge instrument and needle counts were reportedly correct at the end of this case.

## 2019-01-05 NOTE — Anesthesia Procedure Notes (Signed)
Procedure Name: Intubation Date/Time: 01/05/2019 10:47 AM Performed by: Amadeo Garnet, CRNA Pre-anesthesia Checklist: Emergency Drugs available, Patient identified, Suction available, Patient being monitored and Timeout performed Patient Re-evaluated:Patient Re-evaluated prior to induction Oxygen Delivery Method: Circle system utilized Preoxygenation: Pre-oxygenation with 100% oxygen Induction Type: IV induction Ventilation: Mask ventilation without difficulty Laryngoscope Size: Mac and 3 Grade View: Grade I Tube type: Oral Tube size: 7.5 mm Number of attempts: 1 Airway Equipment and Method: Stylet Placement Confirmation: ETT inserted through vocal cords under direct vision,  positive ETCO2 and breath sounds checked- equal and bilateral Secured at: 22 cm Tube secured with: Tape Dental Injury: Teeth and Oropharynx as per pre-operative assessment

## 2019-01-05 NOTE — Progress Notes (Signed)
Subjective: The patient is alert and pleasant.  He is in no apparent distress.  He looks well.  Objective: Vital signs in last 24 hours: Temp:  [98 F (36.7 C)-98.1 F (36.7 C)] 98 F (36.7 C) (10/19 1627) Pulse Rate:  [78-99] 79 (10/19 1657) Resp:  [12-20] 14 (10/19 1657) BP: (118-133)/(63-89) 118/71 (10/19 1657) SpO2:  [96 %-100 %] 100 % (10/19 1657) Weight:  [91.5 kg] 91.5 kg (10/19 0912) Estimated body mass index is 27.75 kg/m as calculated from the following:   Height as of this encounter: 5' 11.5" (1.816 m).   Weight as of this encounter: 91.5 kg.   Intake/Output from previous day: No intake/output data recorded. Intake/Output this shift: Total I/O In: 1670 [I.V.:1500; Blood:170] Out: 700 [Urine:300; Blood:400]  Physical exam the patient is alert and pleasant.  He is moving his lower extremities well.  Lab Results: No results for input(s): WBC, HGB, HCT, PLT in the last 72 hours. BMET No results for input(s): NA, K, CL, CO2, GLUCOSE, BUN, CREATININE, CALCIUM in the last 72 hours.  Studies/Results: Dg Lumbar Spine 2-3 Views  Result Date: 01/05/2019 CLINICAL DATA:  L2-4 PLIF revision EXAM: DG C-ARM 1-60 MIN FLUOROSCOPY TIME:  Number of Acquired Spot Images: 0 COMPARISON:  None. FINDINGS: Multiple images were obtained during a lumbar PLIF revision. Pedicle rods and screws are seen at multiple levels. Two disc spacer devices are identified. Levels cannot be accurately determined on provided images. IMPRESSION: Postsurgical changes. Electronically Signed   By: Dorise Bullion III M.D   On: 01/05/2019 15:25   Dg C-arm 1-60 Min  Result Date: 01/05/2019 CLINICAL DATA:  L2-4 PLIF revision EXAM: DG C-ARM 1-60 MIN FLUOROSCOPY TIME:  Number of Acquired Spot Images: 0 COMPARISON:  None. FINDINGS: Multiple images were obtained during a lumbar PLIF revision. Pedicle rods and screws are seen at multiple levels. Two disc spacer devices are identified. Levels cannot be accurately  determined on provided images. IMPRESSION: Postsurgical changes. Electronically Signed   By: Dorise Bullion III M.D   On: 01/05/2019 15:25    Assessment/Plan: The patient is doing well.  I spoke with his wife.  LOS: 0 days     Ophelia Charter 01/05/2019, 5:15 PM

## 2019-01-05 NOTE — H&P (Signed)
Subjective: The patient is a 73 year old white male on whom I previously performed L4-5 and L5-S1 decompression, instrumentation and fusions.  He has done well for years but has developed recurrent back and bilateral leg pain.  He has failed medical management.  He was worked up with a lumbar MRI and lumbar x-rays which demonstrated L2-3 and L3-4 degenerative disc disease, stenosis, etc.  I discussed the various treatment options with him.  He has decided to proceed with surgery after weighing the risk, benefits and alternatives.  Past Medical History:  Diagnosis Date  . Arthritis 10-22-11   hx. osteoarthritis-knees  . Ascending aortic aneurysm [facility])    Last CT in July 2012  . 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 treated   . Coronary artery disease    minimal per cath in 2011  . Dyspnea   . GERD (gastroesophageal reflux disease)   . Heart murmur   . History of kidney stones    "YRS AGO"  nothing in last 10 yrs  . Hypertension   . Lactose intolerance    used to be, not anymore per patient.  . Left bundle branch block   . Nephrolithiasis     Past Surgical History:  Procedure Laterality Date  . APPENDECTOMY    . BACK SURGERY    . BUNIONECTOMY Right   . CARDIAC CATHETERIZATION  12/22/09   EF60%/minimal nonobstructive atherosclerotic coronary artery disease/normal lt ventricular function/mod aortic root dilatation  . CYSTOSCOPY WITH RETROGRADE PYELOGRAM, URETEROSCOPY AND STENT PLACEMENT Left 07/28/2012   Procedure: CYSTOSCOPY WITH RETROGRADE PYELOGRAM, LEFT URETEROSCOPY ;  Surgeon: Dutch Gray, MD;  Location: [facility];  Service: Urology;  Laterality: Left;  . CYSTOSCOPY/RETROGRADE/URETEROSCOPY/STONE EXTRACTION WITH BASKET  10-22-11   1'12  . ELBOW SURGERY Right 1990s?   "tendon repaired"  . JOINT REPLACEMENT     both knees & R hip  . KNEE ARTHROSCOPY     "? side"  . LUMBAR SPINE SURGERY  2003   Lumbar fusion -retained hardware([facility])  . PARTIAL NEPHRECTOMY  Right 10/2011  . TONSILLECTOMY    . TOTAL HIP ARTHROPLASTY Right 03/26/2017   Procedure: RIGHT TOTAL HIP ARTHROPLASTY ANTERIOR APPROACH;  Surgeon: Renette Butters, MD;  Location: [facility];  Service: Orthopedics;  Laterality: Right;  . TOTAL KNEE ARTHROPLASTY     bilateral    Allergies  Allergen Reactions  . Tramadol Shortness Of Breath    Social History   Tobacco Use  . Smoking status: Never Smoker  . Smokeless tobacco: Never Used  Substance Use Topics  . Alcohol use: No    Family History  Problem Relation Age of Onset  . Cancer Father        pancreatic  . Heart attack Brother    Prior to Admission medications   Medication Sig Start Date End Date Taking? Authorizing Provider  acetaminophen (TYLENOL) 500 MG tablet Take 500 mg by mouth every 6 (six) hours as needed for moderate pain.   Yes [provider]  ALPRAZolam (XANAX) 0.25 MG tablet Take 0.25 mg by mouth at bedtime.    Yes [provider]  amLODipine (NORVASC) 5 MG tablet TAKE 1 TABLET BY MOUTH EVERY DAY Patient taking differently: Take 5 mg by mouth daily.  04/04/18  Yes Martinique, Peter M, MD  aspirin EC 81 MG tablet Take 81 mg by mouth daily.   Yes [provider]  diclofenac (VOLTAREN) 25 MG EC tablet Take 25 mg by  mouth daily. 01/18/17  Yes [provider]  losartan (COZAAR) 100 MG tablet TAKE 1 TABLET BY MOUTH EVERY DAY Patient taking differently: Take 100 mg by mouth daily.  04/04/18  Yes Martinique, Peter M, MD  Melatonin 5 MG CAPS Take 5 mg by mouth at bedtime.    Yes [provider]  omeprazole (PRILOSEC) 20 MG capsule Take 20 mg by mouth every morning.    Yes [provider]  potassium citrate (UROCIT-K) 10 MEQ (1080 MG) SR tablet Take 20 mEq by mouth 2 (two) times daily.   Yes [provider]  pravastatin (PRAVACHOL) 40 MG tablet Take 40 mg by mouth at bedtime.  02/18/17  Yes [provider]  tamsulosin (FLOMAX) 0.4 MG CAPS capsule Take 0.4 mg by mouth  at bedtime.  01/03/17  Yes [provider]  HYDROcodone-acetaminophen (NORCO) 5-325 MG tablet Take 1-2 tablets by mouth every 4 (four) hours as needed for moderate pain. Patient not taking: Reported on 12/26/2018 03/26/17   Prudencio Burly III, PA-C     Review of Systems  Positive ROS: As above  All other systems have been reviewed and were otherwise negative with the exception of those mentioned in the HPI and as above.  Objective: Vital signs in last 24 hours: Temp:  [98.1 F (36.7 C)] 98.1 F (36.7 C) (10/19 0912) Pulse Rate:  [99] 99 (10/19 0912) Resp:  [20] 20 (10/19 0912) BP: (133)/(89) 133/89 (10/19 0912) SpO2:  [96 %] 96 % (10/19 0912) Weight:  [91.5 kg] 91.5 kg (10/19 0912) Estimated body mass index is 27.75 kg/m as calculated from the following:   Height as of this encounter: 5' 11.5" (1.816 m).   Weight as of this encounter: 91.5 kg.   General Appearance: Alert Head: Normocephalic, without obvious abnormality, atraumatic Eyes: PERRL, conjunctiva/corneas clear, EOM's intact,    Ears: Normal  Throat: Normal  Neck: Supple, Back: His lumbar incision is well-healed. Lungs: Clear to auscultation bilaterally, respirations unlabored Heart: Regular rate and rhythm, no murmur, rub or gallop Abdomen: Soft, non-tender Extremities: Extremities normal, atraumatic, no cyanosis or edema Skin: unremarkable  NEUROLOGIC:   Mental status: alert and oriented,Motor Exam - grossly normal Sensory Exam - grossly normal Reflexes:  Coordination - grossly normal Gait - grossly normal Balance - grossly normal Cranial Nerves: I: smell Not tested  II: visual acuity  OS: Normal  OD: Normal   II: visual fields Full to confrontation  II: pupils Equal, round, reactive to light  III,VII: ptosis None  III,IV,VI: extraocular muscles  Full ROM  V: mastication Normal  V: facial light touch sensation  Normal  V,VII: corneal reflex  Present  VII: facial muscle function -  upper  Normal  VII: facial muscle function - lower Normal  VIII: hearing Not tested  IX: soft palate elevation  Normal  IX,X: gag reflex Present  XI: trapezius strength  5/5  XI: sternocleidomastoid strength 5/5  XI: neck flexion strength  5/5  XII: tongue strength  Normal    Data Review Lab Results  Component Value Date   WBC 6.0 12/30/2018   HGB 17.2 (H) 12/30/2018   HCT 49.7 12/30/2018   MCV 92.9 12/30/2018   PLT 176 12/30/2018   Lab Results  Component Value Date   NA 141 12/30/2018   K 5.0 12/30/2018   CL 108 12/30/2018   CO2 23 12/30/2018   BUN 31 (H) 12/30/2018   CREATININE 1.73 (H) 12/30/2018   GLUCOSE 81 12/30/2018   Lab  Results  Component Value Date   INR 1.4 09/04/2006    Assessment/Plan: L2-3 and L3-4 degenerative disc disease, stenosis, lumbago, lumbar radiculopathy, neurogenic claudication: I have discussed the situation with the patient.  I have reviewed his imaging studies with him and pointed out the abnormalities.  We have discussed the various treatment options including surgery.  I have described the surgical treatment option of an exploration of his lumbar fusion with an L2-3 and L3-4 decompression, instrumentation and fusion.  I have shown him surgical models.  I have given him a surgical pamphlet.  We have discussed the risks, benefits, alternatives, expected postoperative course, and likelihood of achieving our goals with surgery.  I have answered all his questions.  He has decided to proceed with surgery.   Ophelia Charter 01/05/2019 10:28 AM

## 2019-01-05 NOTE — Progress Notes (Signed)
Orthopedic Tech Progress Note Patient Details:  Corey [name] [birth year] [id number] RN said patient wife has brace Patient ID: Corey [name], male   DOB: 1946-02-02, 73 y.o.   MRN: [id number]   [name] 01/05/2019, 6:06 PM

## 2019-01-05 NOTE — Transfer of Care (Signed)
Immediate Anesthesia Transfer of Care Note  Patient: Corey [name]  Procedure(s) Performed: POSTERIOR LUMBAR INTERBODY FUSION, POSTERIOR INSTRUMENTATION LUMBAR TWO- LUMBAR THREE, LUMBAR THREE- LUMBAR FOUR; EXPLORE FUSION (N/A Spine Lumbar)  Patient Location: PACU  Anesthesia Type:General  Level of Consciousness: awake, alert  and oriented  Airway & Oxygen Therapy: Patient Spontanous Breathing and Patient connected to nasal cannula oxygen  Post-op Assessment: Report given to RN, Post -op Vital signs reviewed and stable and Patient moving all extremities  Post vital signs: Reviewed and stable  Last Vitals:  Vitals Value Taken Time  BP 121/63 01/05/19 1627  Temp 36.7 C 01/05/19 1627  Pulse 84 01/05/19 1631  Resp 24 01/05/19 1631  SpO2 97 % 01/05/19 1631  Vitals shown include unvalidated device data.  Last Pain:  Vitals:   01/05/19 1627  TempSrc:   PainSc: 0-No pain         Complications: No apparent anesthesia complications

## 2019-01-06 LAB — CBC
HCT: 37.8 % — ABNORMAL LOW (ref 39.0–52.0)
Hemoglobin: 12.9 g/dL — ABNORMAL LOW (ref 13.0–17.0)
MCH: 32.3 pg (ref 26.0–34.0)
MCHC: 34.1 g/dL (ref 30.0–36.0)
MCV: 94.5 fL (ref 80.0–100.0)
Platelets: 156 10*3/uL (ref 150–400)
RBC: 4 MIL/uL — ABNORMAL LOW (ref 4.22–5.81)
RDW: 12.2 % (ref 11.5–15.5)
WBC: 10.9 10*3/uL — ABNORMAL HIGH (ref 4.0–10.5)
nRBC: 0 % (ref 0.0–0.2)

## 2019-01-06 LAB — BASIC METABOLIC PANEL
Anion gap: 9 (ref 5–15)
BUN: 31 mg/dL — ABNORMAL HIGH (ref 8–23)
CO2: 23 mmol/L (ref 22–32)
Calcium: 8.1 mg/dL — ABNORMAL LOW (ref 8.9–10.3)
Chloride: 105 mmol/L (ref 98–111)
Creatinine, Ser: 1.78 mg/dL — ABNORMAL HIGH (ref 0.61–1.24)
GFR calc Af Amer: 43 mL/min — ABNORMAL LOW (ref >60)
GFR calc non Af Amer: 37 mL/min — ABNORMAL LOW (ref >60)
Glucose, Bld: 154 mg/dL — ABNORMAL HIGH (ref 70–99)
Potassium: 4.4 mmol/L (ref 3.5–5.1)
Sodium: 137 mmol/L (ref 135–145)

## 2019-01-06 MED ORDER — OXYCODONE HCL 10 MG PO TABS: 10.0000 mg | ORAL_TABLET | ORAL | 0 refills | Status: DC | PRN

## 2019-01-06 MED ORDER — DOCUSATE SODIUM 100 MG PO CAPS: 100.0000 mg | ORAL_CAPSULE | Freq: Two times a day (BID) | ORAL | 0 refills | Status: DC

## 2019-01-06 MED ORDER — CYCLOBENZAPRINE HCL 10 MG PO TABS: 10.0000 mg | ORAL_TABLET | Freq: Three times a day (TID) | ORAL | 0 refills | Status: DC | PRN

## 2019-01-06 MED ORDER — ALUM & MAG HYDROXIDE-SIMETH 200-200-20 MG/5ML PO SUSP
30.0000 mL | ORAL | Status: DC | PRN
  Administered 2019-01-06: 30 mL via ORAL

## 2019-01-06 MED FILL — Gelatin Absorbable MT Powder: OROMUCOSAL | Qty: 1 | Status: AC

## 2019-01-06 MED FILL — Thrombin For Soln 5000 Unit: CUTANEOUS | Qty: 5000 | Status: AC

## 2019-01-06 NOTE — Plan of Care (Signed)
Patient alert and oriented, mae's well, voiding adequate amount of urine, swallowing without difficulty, no c/o pain at time of discharge. Patient discharged home with family. Script and discharged instructions given to patient. Patient and family stated understanding of instructions given. Patient has an appointment with Dr. [name]   

## 2019-01-06 NOTE — Progress Notes (Signed)
Orthopedic Tech Progress Note Patient Details:  Corey [name] [birth year] [id number] Called in order to [facility] for a new [place]. Patient ID: Corey [name], male   DOB: Jul 10, 1945, 73 y.o.   MRN: [id number]   [name] 01/06/2019, 8:34 AM

## 2019-01-06 NOTE — Discharge Summary (Signed)
Physician Discharge Summary  Patient ID: NASHAUN VIVERITO MRN: [id number] DOB/AGE: [birth year] 74 y.o.  Admit date: 01/05/2019 Discharge date: 01/06/2019  Admission Diagnoses: Lumbar degenerative disc disease, lumbar spinal stenosis, lumbago, lumbar radiculopathy, neurogenic claudication  Discharge Diagnoses: The same Active Problems:   Lumbar stenosis with neurogenic claudication   Discharged Condition: good  Hospital Course: I performed an exploration of patient's lumbar fusion with extension to L2-3 and L3-4 on 01/05/2019.  The surgery went well.  The patient's postoperative course was unremarkable.  On postoperative day #1 the patient felt well and requested discharge to home.  He was given written and oral discharge instructions.  All his questions were answered.  He disc space cultures demonstrated white cells but the culture was negative so far.  Consults: Physical therapy, Occupational Therapy, care management Significant Diagnostic Studies: L2-3 disc space culture Treatments: Exploration of lumbar fusion, L2-3 and L3-4 decompression, instrumentation and fusion. Discharge Exam: Blood pressure 128/73, pulse 100, temperature 98.4 F (36.9 C), temperature source Oral, resp. rate 20, height 5' 11.5" (1.816 m), weight 91.5 kg, SpO2 95 %. The patient is alert and pleasant.  He looks well.  His strength is normal.  Disposition: Home  Discharge Instructions    Call MD for:  difficulty breathing, headache or visual disturbances   Complete by: As directed    Call MD for:  extreme fatigue   Complete by: As directed    Call MD for:  hives   Complete by: As directed    Call MD for:  persistant dizziness or light-headedness   Complete by: As directed    Call MD for:  persistant nausea and vomiting   Complete by: As directed    Call MD for:  redness, tenderness, or signs of infection (pain, swelling, redness, odor or green/yellow discharge around incision site)   Complete by: As  directed    Call MD for:  severe uncontrolled pain   Complete by: As directed    Call MD for:  temperature >100.4   Complete by: As directed    Diet - low sodium heart healthy   Complete by: As directed    Discharge instructions   Complete by: As directed    Call [phone] for a followup appointment. Take a stool softener while you are using pain medications.   Driving Restrictions   Complete by: As directed    Do not drive for 2 weeks.   Increase activity slowly   Complete by: As directed    Lifting restrictions   Complete by: As directed    Do not lift more than 5 pounds. No excessive bending or twisting.   May shower / Bathe   Complete by: As directed    Remove the dressing for 3 days after surgery.  You may shower, but leave the incision alone.   Remove dressing in 48 hours   Complete by: As directed    Your stitches are under the scan and will dissolve by themselves. The Steri-Strips will fall off after you take a few showers. Do not rub back or pick at the wound, Leave the wound alone.     Allergies as of 01/06/2019      Reactions   Tramadol Shortness Of Breath      Medication List    STOP taking these medications   diclofenac 25 MG EC tablet Commonly known as: VOLTAREN   HYDROcodone-acetaminophen 5-325 MG tablet Commonly known as: Norco     TAKE these medications   acetaminophen  500 MG tablet Commonly known as: TYLENOL Take 500 mg by mouth every 6 (six) hours as needed for moderate pain.   ALPRAZolam 0.25 MG tablet Commonly known as: XANAX Take 0.25 mg by mouth at bedtime.   amLODipine 5 MG tablet Commonly known as: NORVASC TAKE 1 TABLET BY MOUTH EVERY DAY   aspirin EC 81 MG tablet Take 81 mg by mouth daily.   cyclobenzaprine 10 MG tablet Commonly known as: FLEXERIL Take 1 tablet (10 mg total) by mouth 3 (three) times daily as needed for muscle spasms.   docusate sodium 100 MG capsule Commonly known as: COLACE Take 1 capsule (100 mg total) by  mouth 2 (two) times daily.   losartan 100 MG tablet Commonly known as: COZAAR TAKE 1 TABLET BY MOUTH EVERY DAY   Melatonin 5 MG Caps Take 5 mg by mouth at bedtime.   omeprazole 20 MG capsule Commonly known as: PRILOSEC Take 20 mg by mouth every morning.   Oxycodone HCl 10 MG Tabs Take 1 tablet (10 mg total) by mouth every 4 (four) hours as needed for severe pain ((score 7 to 10)).   potassium citrate 10 MEQ (1080 MG) SR tablet Commonly known as: UROCIT-K Take 20 mEq by mouth 2 (two) times daily.   pravastatin 40 MG tablet Commonly known as: PRAVACHOL Take 40 mg by mouth at bedtime.   tamsulosin 0.4 MG Caps capsule Commonly known as: FLOMAX Take 0.4 mg by mouth at bedtime.        Signed: Ophelia Charter 01/06/2019, 7:42 AM

## 2019-01-06 NOTE — Progress Notes (Signed)
Occupational Therapy Evaluation Patient Details Name: Corey [name] MRN: [id number] DOB: February 07, 1946 Today's Date: 01/06/2019    History of Present Illness Pt is 73 yo male s/p Bilateral L2-3 and L3-4 laminotomy/foraminotomies/medial facetectomy to decompress the bilateral L2, L3 and L4 nerve roots. PMH including L4-S1 decompression and fusions, ascending aortic aneurysm, B TKA, R THA, right elbow surgery, kidney cancer, and HTN.   Clinical Impression   PTA, pt lived in two story home with wife and was independent for ADLs; reporting wife assists with IADLs. Pt currently presents with decreased strength, balance, safety, knowledge of precautions, and increased pain. Provided education regarding compensatory techniques for ADLs. Pt performed UB dressing, LB dressing, and functional transfers with min guard A and VCs for maintaining precautions; required max A for donning socks and shoes. Pt experienced episodes of LOB while dressing, required min A to gain balance. Pt would benefit from continued OT acutely to facilitate safe dc. Recommend dc with HHOT to increase safe performance of ADLs.     Follow Up Recommendations  Home health OT;Supervision/Assistance - 24 hour    Equipment Recommendations  None recommended by OT(Pt reports access to shower chair)    Recommendations for Other Services PT consult     Precautions / Restrictions Precautions Precautions: Back;Fall Precaution Booklet Issued: No Required Braces or Orthoses: Spinal Brace Spinal Brace: Lumbar corset;Applied in sitting position Restrictions Weight Bearing Restrictions: No      Mobility Bed Mobility Overal bed mobility: Needs Assistance Bed Mobility: Rolling;Sidelying to Sit;Sit to Supine Rolling: Min guard Sidelying to sit: Min guard   Sit to supine: Min guard   General bed mobility comments: able to verbalize log rolling technique for maintaining back precautions. pt attempted sit<>supine multiple times,  required VCs to for compensatory technique  Transfers Overall transfer level: Needs assistance Equipment used: None Transfers: Sit to/from Stand Sit to Stand: Min guard         General transfer comment: min guard for safety. Had episodes of LOB while standing, required min A to gain balance    Balance Overall balance assessment: Needs assistance Sitting-balance support: No upper extremity supported;Feet supported Sitting balance-Leahy Scale: Poor Sitting balance - Comments: pt demonstrated difficulty maintaining balance while sitting EOB Postural control: Posterior lean Standing balance support: No upper extremity supported;During functional activity Standing balance-Leahy Scale: Poor Standing balance comment: Pt had episodes of LOB while standing, required min A to gain balance                           ADL either performed or assessed with clinical judgement   ADL Overall ADL's : Needs assistance/impaired Eating/Feeding: Independent;Sitting   Grooming: Min guard;Sitting   Upper Body Bathing: Min guard;Sitting   Lower Body Bathing: Minimal assistance;Sit to/from stand   Upper Body Dressing : Min guard;Cueing for compensatory techniques;Cueing for safety;Sitting Upper Body Dressing Details (indicate cue type and reason): provided education regarding brace management. Pt donned shirt and brace with min guard A and VCs for brace management Lower Body Dressing: Cueing for safety;Cueing for compensatory techniques;Cueing for back precautions;Sit to/from stand;Min guard;Maximal assistance Lower Body Dressing Details (indicate cue type and reason): provided education for compensatory strategies for LB ADLs. Pt performed donned pants with min guard A and VCs for maintaining precautions. Required total A for donning/doffing socks and shoes. Reports he can have wife help with socks and shoes at home Toilet Transfer: Ambulation;Min guard Toilet Transfer Details (indicate cue  type and reason):  min guard A for safety and balance Toileting- Clothing Manipulation and Hygiene: Min guard;Sit to/from stand       Functional mobility during ADLs: Min guard;Minimal assistance General ADL Comments: Pt required min guard-min A for most ADLs for balance and safety. requires total A for socks and shoes. requires VCs for maintaining precautions.     Vision Baseline Vision/History: Wears glasses Wears Glasses: At all times Patient Visual Report: No change from baseline       Perception     Praxis      Pertinent Vitals/Pain Pain Assessment: Faces Faces Pain Scale: Hurts little more Pain Location: back Pain Descriptors / Indicators: Aching;Discomfort;Grimacing;Guarding;Sore Pain Intervention(s): Limited activity within patient's tolerance;Monitored during session;Premedicated before session     Hand Dominance     Extremity/Trunk Assessment Upper Extremity Assessment Upper Extremity Assessment: Overall WFL for tasks assessed   Lower Extremity Assessment Lower Extremity Assessment: Defer to PT evaluation   Cervical / Trunk Assessment Cervical / Trunk Assessment: Other exceptions Cervical / Trunk Exceptions: s/p back surgery   Communication Communication Communication: No difficulties   Cognition Arousal/Alertness: Awake/alert Behavior During Therapy: Impulsive Overall Cognitive Status: No family/caregiver present to determine baseline cognitive functioning                                 General Comments: Pt would impulsively move and change positions. Reviewed all precautions, but then attempted to bend to dress self. Required mod VCs throughout to maintain precautions. Pt has poor safety awareness as seen by refusal to use walker after episodes of LOB, reporting "I can walk fine without it"   General Comments  Pt reports wife will be able to assist him with some ADLs when he returns home    Exercises     Shoulder Instructions       Home Living Family/patient expects to be discharged to:: Private residence Living Arrangements: Spouse/significant other Available Help at Discharge: Family Type of Home: House Home Access: Stairs to enter Technical brewer of Steps: 3   Home Layout: Two level;Bed/bath upstairs Alternate Level Stairs-Number of Steps: flight   Bathroom Shower/Tub: Occupational psychologist: Standard     Home Equipment: Environmental consultant - 2 wheels;Shower seat          Prior Functioning/Environment Level of Independence: Independent        Comments: Pt reports independence with ADLs, wife takes care of IADLs        OT Problem List: Decreased strength;Decreased range of motion;Decreased activity tolerance;Impaired balance (sitting and/or standing);Decreased safety awareness;Decreased knowledge of use of DME or AE;Decreased knowledge of precautions;Pain      OT Treatment/Interventions: Self-care/ADL training;DME and/or AE instruction;Therapeutic activities;Patient/family education;Balance training    OT Goals(Current goals can be found in the care plan section) Acute Rehab OT Goals Patient Stated Goal: go home OT Goal Formulation: With patient Time For Goal Achievement: 01/20/19 Potential to Achieve Goals: Good ADL Goals Pt Will Perform Grooming: with supervision;sitting Pt Will Perform Upper Body Dressing: with supervision;sitting Pt Will Perform Lower Body Dressing: sit to/from stand;with supervision Pt Will Transfer to Toilet: with supervision;ambulating;regular height toilet Pt Will Perform Toileting - Clothing Manipulation and hygiene: sit to/from stand;with supervision Pt Will Perform Tub/Shower Transfer: Shower transfer;with supervision;ambulating;shower seat Additional ADL Goal #1: Pt will verbalize 3/3 back precautions with 1 VC Additional ADL Goal #2: Pt will perform log rolling technique for bed mobility in preparation for ADLs with supervision  OT Frequency: Min  3X/week   Barriers to D/C:            Co-evaluation              AM-PAC OT "6 Clicks" Daily Activity     Outcome Measure Help from another person eating meals?: None Help from another person taking care of personal grooming?: A Little Help from another person toileting, which includes using toliet, bedpan, or urinal?: A Little Help from another person bathing (including washing, rinsing, drying)?: A Little Help from another person to put on and taking off regular upper body clothing?: A Little Help from another person to put on and taking off regular lower body clothing?: A Lot 6 Click Score: 18   End of Session Equipment Utilized During Treatment: Back brace Nurse Communication: Mobility status  Activity Tolerance: Patient tolerated treatment well Patient left: in bed;with call bell/phone within reach;Other (comment)(dovetail with PT)  OT Visit Diagnosis: Unsteadiness on feet (R26.81);Other abnormalities of gait and mobility (R26.89);Muscle weakness (generalized) (M62.81);Pain Pain - part of body: (back)                Time: [id number] OT Time Calculation (min): 27 min Charges:  OT General Charges $OT Visit: 1 Visit OT Evaluation $OT Eval Moderate Complexity: 1 Mod OT Treatments $Self Care/Home Management : 8-22 mins  Gus Rankin, OT Student  Gus Rankin 01/06/2019, 9:32 AM

## 2019-01-06 NOTE — Evaluation (Signed)
Physical Therapy Evaluation Patient Details Name: Corey [name] MRN: [id number] DOB: 20-Mar-1945 Today's Date: 01/06/2019   History of Present Illness  Pt is 73 yo male s/p Bilateral L2-3 and L3-4 laminotomy/foraminotomies/medial facetectomy to decompress the bilateral L2, L3 and L4 nerve roots. PMH including L4-S1 decompression and fusions, ascending aortic aneurysm, B TKA, R THA, right elbow surgery, kidney cancer, and HTN.  Clinical Impression  Pt admitted with above diagnosis. At the time of PT eval, pt was able to demonstrate transfers and ambulation with up to mod assist. Pt with poor safety awareness and insight to deficits, adamantly refusing the walker stating "I don't need it", however requiring mod assist at times to prevent a fall in the hallway. Pt was educated on precautions, brace application/wearing schedule, appropriate activity progression, and car transfer. Pt currently with functional limitations due to the deficits listed below (see PT Problem List). Pt will benefit from skilled PT to increase their independence and safety with mobility to allow discharge home with home health PT services to follow-up.      Follow Up Recommendations Home health PT;Supervision for mobility/OOB    Equipment Recommendations  None recommended by PT    Recommendations for Other Services       Precautions / Restrictions Precautions Precautions: Back;Fall Precaution Booklet Issued: No Required Braces or Orthoses: Spinal Brace Spinal Brace: Lumbar corset;Applied in sitting position Restrictions Weight Bearing Restrictions: No      Mobility  Bed Mobility Overal bed mobility: Needs Assistance Bed Mobility: Rolling;Sit to Sidelying Rolling: Supervision Sidelying to sit: Min guard   Sit to supine: Min guard Sit to sidelying: Min guard General bed mobility comments: poor carryover of education for logroll and required step-by-step education  Transfers Overall transfer level: Needs  assistance Equipment used: None Transfers: Sit to/from Stand Sit to Stand: Min guard;Min assist         General transfer comment: min guard for safety. Had episodes of LOB while standing, required min A to gain balance  Ambulation/Gait Ambulation/Gait assistance: Min guard;Min assist;Mod assist Gait Distance (Feet): 250 Feet Assistive device: None Gait Pattern/deviations: Step-through pattern;Decreased stride length;Staggering left;Drifts right/left Gait velocity: Decreased Gait velocity interpretation: <1.8 ft/sec, indicate of risk for recurrent falls General Gait Details: Up to mod assist required for balance support and safety. Pt with frequent losses of balance to the L and difficulty recovering. Pt adamantly refusing the walker.   Stairs Stairs: Yes Stairs assistance: Min assist Stair Management: One rail Right;Step to pattern;Alternating pattern;Forwards;Sideways Number of Stairs: 10 General stair comments: VC's for sequencing and safety. Posterior lean with ascend requiring min assist. Pt not placing foot fully on each step despite repetitive cueing increasing risk for missing a step or fall.   Wheelchair Mobility    Modified Rankin (Stroke Patients Only)       Balance Overall balance assessment: Needs assistance Sitting-balance support: No upper extremity supported;Feet supported Sitting balance-Leahy Scale: Poor Sitting balance - Comments: pt demonstrated difficulty maintaining balance while sitting EOB Postural control: Posterior lean Standing balance support: No upper extremity supported;During functional activity Standing balance-Leahy Scale: Poor Standing balance comment: Pt had episodes of LOB while standing, required min A to gain balance                             Pertinent Vitals/Pain Pain Assessment: Faces Faces Pain Scale: Hurts little more Pain Location: back Pain Descriptors / Indicators: Aching;Discomfort;Grimacing;Guarding;Sore Pain  Intervention(s): Monitored during session  Home Living Family/patient expects to be discharged to:: Private residence Living Arrangements: Spouse/significant other Available Help at Discharge: Family Type of Home: House Home Access: Stairs to enter   Technical brewer of Steps: [place]: Two level;Bed/bath upstairs Home Equipment: [place] - 2 wheels;Shower seat      Prior Function Level of Independence: Independent         Comments: Pt reports independence with ADLs, wife takes care of IADLs     Hand Dominance        Extremity/Trunk Assessment   Upper Extremity Assessment Upper Extremity Assessment: Defer to OT evaluation    Lower Extremity Assessment Lower Extremity Assessment: Generalized weakness    Cervical / Trunk Assessment Cervical / Trunk Assessment: Other exceptions Cervical / Trunk Exceptions: s/p back surgery  Communication   Communication: No difficulties  Cognition Arousal/Alertness: Awake/alert Behavior During Therapy: Impulsive Overall Cognitive Status: Impaired/Different from baseline Area of Impairment: Memory;Safety/judgement                     Memory: Decreased recall of precautions   Safety/Judgement: Decreased awareness of safety;Decreased awareness of deficits     General Comments: Pt would impulsively move and change positions. Reviewed all precautions, but then attempted to bend to dress self. Required mod VCs throughout to maintain precautions. Pt has poor safety awareness as seen by refusal to use walker after episodes of LOB, reporting "I can walk fine without it"      General Comments General comments (skin integrity, edema, etc.): Pt reports wife will be able to assist him with some ADLs when he returns home    Exercises     Assessment/Plan    PT Assessment Patient needs continued PT services  PT Problem List Decreased strength;Decreased activity tolerance;Decreased balance;Decreased mobility;Decreased  knowledge of use of DME;Decreased safety awareness;Decreased knowledge of precautions;Pain       PT Treatment Interventions DME instruction;Gait training;Functional mobility training;Therapeutic activities;Patient/family education;Neuromuscular re-education;Balance training;Therapeutic exercise    PT Goals (Current goals can be found in the Care Plan section)  Acute Rehab PT Goals Patient Stated Goal: go home PT Goal Formulation: With patient Time For Goal Achievement: 01/13/19 Potential to Achieve Goals: Good    Frequency Min 5X/week   Barriers to discharge        Co-evaluation               AM-PAC PT "6 Clicks" Mobility  Outcome Measure Help needed turning from your back to your side while in a flat bed without using bedrails?: None Help needed moving from lying on your back to sitting on the side of a flat bed without using bedrails?: A Little Help needed moving to and from a bed to a chair (including a wheelchair)?: A Little Help needed standing up from a chair using your arms (e.g., wheelchair or bedside chair)?: A Little Help needed to walk in hospital room?: A Little Help needed climbing 3-5 steps with a railing? : A Little 6 Click Score: 19    End of Session Equipment Utilized During Treatment: Gait belt Activity Tolerance: Patient tolerated treatment well Patient left: in bed;with call bell/phone within reach;with bed alarm set Nurse Communication: Mobility status PT Visit Diagnosis: Unsteadiness on feet (R26.81);Pain Pain - part of body: (back)    Time: 0840-0900 PT Time Calculation (min) (ACUTE ONLY): 20 min   Charges:   PT Evaluation $PT Eval Moderate Complexity: 1 Mod          Rolinda Roan, PT, DPT  Acute Rehabilitation Services Pager: [phone] Office: [phone]   Thelma Comp 01/06/2019, 11:24 AM

## 2019-01-07 MED FILL — Heparin Sodium (Porcine) Inj 1000 Unit/ML: INTRAMUSCULAR | Qty: 30 | Status: AC

## 2019-01-07 MED FILL — Sodium Chloride IV Soln 0.9%: INTRAVENOUS | Qty: 2000 | Status: AC

## 2019-01-07 NOTE — Anesthesia Postprocedure Evaluation (Signed)
Anesthesia Post Note  Patient: Corey [name]  Procedure(s) Performed: POSTERIOR LUMBAR INTERBODY FUSION, POSTERIOR INSTRUMENTATION LUMBAR TWO- LUMBAR THREE, LUMBAR THREE- LUMBAR FOUR; EXPLORE FUSION (N/A Spine Lumbar)     Patient location during evaluation: PACU Anesthesia Type: General Level of consciousness: sedated and awake Pain management: pain level controlled Vital Signs Assessment: post-procedure vital signs reviewed and stable Respiratory status: spontaneous breathing Cardiovascular status: stable Postop Assessment: no apparent nausea or vomiting Anesthetic complications: no    Last Vitals:  Vitals:   01/06/19 0358 01/06/19 0742  BP: 128/73 107/70  Pulse: 100 96  Resp: 20 18  Temp: 36.9 C 36.8 C  SpO2: 95% 96%    Last Pain:  Vitals:   01/06/19 1105  TempSrc:   PainSc: 3    Pain Goal: Patients Stated Pain Goal: 3 (01/06/19 0653)                 Corey [name]

## 2019-01-10 LAB — AEROBIC/ANAEROBIC CULTURE W GRAM STAIN (SURGICAL/DEEP WOUND): Culture: NO GROWTH

## 2019-01-17 ENCOUNTER — Emergency Department (HOSPITAL_COMMUNITY): Payer: Medicare Other

## 2019-01-17 ENCOUNTER — Other Ambulatory Visit: Payer: Self-pay

## 2019-01-17 ENCOUNTER — Encounter (HOSPITAL_COMMUNITY): Payer: Self-pay | Admitting: Emergency Medicine

## 2019-01-17 ENCOUNTER — Emergency Department (HOSPITAL_COMMUNITY)
Admission: EM | Admit: 2019-01-17 | Discharge: 2019-01-17 | Disposition: A | Discharge status: Home / Self Care | Payer: Medicare Other | Attending: Emergency Medicine | Admitting: Emergency Medicine

## 2019-01-17 DIAGNOSIS — Z79899 Other long term (current) drug therapy: Secondary | ICD-10-CM | POA: Diagnosis not present

## 2019-01-17 DIAGNOSIS — Z85828 Personal history of other malignant neoplasm of skin: Secondary | ICD-10-CM | POA: Diagnosis not present

## 2019-01-17 DIAGNOSIS — K5900 Constipation, unspecified: Secondary | ICD-10-CM | POA: Diagnosis not present

## 2019-01-17 DIAGNOSIS — N183 Chronic kidney disease, stage 3 unspecified: Secondary | ICD-10-CM | POA: Insufficient documentation

## 2019-01-17 DIAGNOSIS — Z7982 Long term (current) use of aspirin: Secondary | ICD-10-CM | POA: Diagnosis not present

## 2019-01-17 DIAGNOSIS — Z85528 Personal history of other malignant neoplasm of kidney: Secondary | ICD-10-CM | POA: Diagnosis not present

## 2019-01-17 DIAGNOSIS — K6289 Other specified diseases of anus and rectum: Secondary | ICD-10-CM | POA: Diagnosis not present

## 2019-01-17 DIAGNOSIS — N200 Calculus of kidney: Secondary | ICD-10-CM | POA: Diagnosis not present

## 2019-01-17 DIAGNOSIS — I129 Hypertensive chronic kidney disease with stage 1 through stage 4 chronic kidney disease, or unspecified chronic kidney disease: Secondary | ICD-10-CM | POA: Insufficient documentation

## 2019-01-17 DIAGNOSIS — Z9889 Other specified postprocedural states: Secondary | ICD-10-CM | POA: Diagnosis not present

## 2019-01-17 LAB — CBC WITH DIFFERENTIAL/PLATELET
Abs Immature Granulocytes: 0.03 10*3/uL (ref 0.00–0.07)
Basophils Absolute: 0.1 10*3/uL (ref 0.0–0.1)
Basophils Relative: 1 %
Eosinophils Absolute: 0.5 10*3/uL (ref 0.0–0.5)
Eosinophils Relative: 6 %
HCT: 45.6 % (ref 39.0–52.0)
Hemoglobin: 15.6 g/dL (ref 13.0–17.0)
Immature Granulocytes: 0 %
Lymphocytes Relative: 16 %
Lymphs Abs: 1.4 10*3/uL (ref 0.7–4.0)
MCH: 32.2 pg (ref 26.0–34.0)
MCHC: 34.2 g/dL (ref 30.0–36.0)
MCV: 94.2 fL (ref 80.0–100.0)
Monocytes Absolute: 0.6 10*3/uL (ref 0.1–1.0)
Monocytes Relative: 7 %
Neutro Abs: 6.1 10*3/uL (ref 1.7–7.7)
Neutrophils Relative %: 70 %
Platelets: 376 10*3/uL (ref 150–400)
RBC: 4.84 MIL/uL (ref 4.22–5.81)
RDW: 11.9 % (ref 11.5–15.5)
WBC: 8.6 10*3/uL (ref 4.0–10.5)
nRBC: 0 % (ref 0.0–0.2)

## 2019-01-17 LAB — COMPREHENSIVE METABOLIC PANEL
ALT: 26 U/L (ref 0–44)
AST: 18 U/L (ref 15–41)
Albumin: 3.6 g/dL (ref 3.5–5.0)
Alkaline Phosphatase: 120 U/L (ref 38–126)
Anion gap: 12 (ref 5–15)
BUN: 25 mg/dL — ABNORMAL HIGH (ref 8–23)
CO2: 22 mmol/L (ref 22–32)
Calcium: 9.5 mg/dL (ref 8.9–10.3)
Chloride: 105 mmol/L (ref 98–111)
Creatinine, Ser: 1.62 mg/dL — ABNORMAL HIGH (ref 0.61–1.24)
GFR calc Af Amer: 48 mL/min — ABNORMAL LOW (ref >60)
GFR calc non Af Amer: 41 mL/min — ABNORMAL LOW (ref >60)
Glucose, Bld: 138 mg/dL — ABNORMAL HIGH (ref 70–99)
Potassium: 4.4 mmol/L (ref 3.5–5.1)
Sodium: 139 mmol/L (ref 135–145)
Total Bilirubin: 0.8 mg/dL (ref 0.3–1.2)
Total Protein: 6.9 g/dL (ref 6.5–8.1)

## 2019-01-17 LAB — POC OCCULT BLOOD, ED: Fecal Occult Bld: POSITIVE — AB

## 2019-01-17 MED ORDER — IOHEXOL 300 MG/ML  SOLN
75.0000 mL | Freq: Once | INTRAMUSCULAR | Status: AC | PRN
  Administered 2019-01-17: 16:00:00 75 mL via INTRAVENOUS

## 2019-01-17 MED ORDER — HYDROMORPHONE HCL 1 MG/ML IJ SOLN
0.5000 mg | Freq: Once | INTRAMUSCULAR | Status: AC
  Administered 2019-01-17: 0.5 mg via INTRAVENOUS
  Filled 2019-01-17: qty 1

## 2019-01-17 NOTE — ED Notes (Signed)
Patient transported to CT 

## 2019-01-17 NOTE — ED Provider Notes (Signed)
[facility] EMERGENCY DEPARTMENT Provider Note   CSN: [id number] Arrival date & time: 01/17/19  1345     History   Chief Complaint Chief Complaint  Patient presents with   Constipation    HPI Corey [name] is a 73 y.o. male with PMH significant for status post L2-L4 fusion performed on 01/05/2019 who presents to the ED with acute constipation and severe 10 out of 10 rectal pain.  He reports that is constant, but particularly worse with straining.  He also feels as though he has increased urgency, but is unable to "pinch it off".  He was prescribed oxycodone 10 mg every 4 hours as needed for severe pain and patient states that he stopped taking them due to his profound constipation.  He reports having had 1 small bowel movement on first day of discharge and has since taken MiraLAX and Colace, with no relief.  He states that he has had numerous kidney stones in the past and that the pain from those stones does not even compared to the pain that he has been experiencing with his constipation.  He denies any abdominal pain, nausea or vomiting, chest pain, shortness of breath, or other symptoms.  He does have diminished appetite which she attributes to the fear of having to go to the bathroom.      HPI  Past Medical History:  Diagnosis Date   Arthritis 10-22-11   hx. 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 treated    Coronary artery disease    minimal per cath in 2011   Dyspnea    GERD (gastroesophageal reflux disease)    Heart murmur    History of kidney stones    "YRS AGO"  nothing in last 10 yrs   Hypertension    Lactose intolerance    used to be, not anymore per patient.   Left bundle branch block    Nephrolithiasis     Patient Active Problem List   Diagnosis Date Noted   Lumbar stenosis with neurogenic claudication 01/05/2019    Primary osteoarthritis of right hip 03/26/2017   CKD (chronic kidney disease) stage 3, GFR 30-59 ml/min 03/04/2017   Coronary artery disease    Chest pain    DYSPNEA 02/01/2010   LACTOSE INTOLERANCE 01/31/2010   Essential hypertension 01/31/2010   LBBB (left bundle branch block) 01/31/2010   Aneurysm of thoracic aorta ([place]) 01/31/2010   G E R D 01/31/2010    Past Surgical History:  Procedure Laterality Date   APPENDECTOMY     BACK SURGERY     BUNIONECTOMY Right    CARDIAC CATHETERIZATION  12/22/09   EF60%/minimal nonobstructive atherosclerotic coronary artery disease/normal lt ventricular function/mod aortic root dilatation   CYSTOSCOPY WITH RETROGRADE PYELOGRAM, URETEROSCOPY AND STENT PLACEMENT Left 07/28/2012   Procedure: CYSTOSCOPY WITH RETROGRADE PYELOGRAM, LEFT URETEROSCOPY ;  Surgeon: Dutch Gray, MD;  Location: [facility];  Service: Urology;  Laterality: Left;   CYSTOSCOPY/RETROGRADE/URETEROSCOPY/STONE EXTRACTION WITH BASKET  10-22-11   1'12   ELBOW SURGERY Right 1990s?   "tendon repaired"   JOINT REPLACEMENT     both knees & R hip   KNEE ARTHROSCOPY     "? side"   LUMBAR SPINE SURGERY  2003   Lumbar fusion -retained hardware([name])   PARTIAL NEPHRECTOMY Right 10/2011   TONSILLECTOMY     TOTAL  HIP ARTHROPLASTY Right 03/26/2017   Procedure: RIGHT TOTAL HIP ARTHROPLASTY ANTERIOR APPROACH;  Surgeon: Renette Butters, MD;  Location: [facility];  Service: Orthopedics;  Laterality: Right;   TOTAL KNEE ARTHROPLASTY     bilateral        Home Medications    Prior to Admission medications   Medication Sig Start Date End Date Taking? Authorizing Provider  acetaminophen (TYLENOL) 500 MG tablet Take 500 mg by mouth every 6 (six) hours as needed for moderate pain.   Yes [provider]  ALPRAZolam (XANAX) 0.25 MG tablet Take 0.25 mg by mouth at bedtime.    Yes [provider]  amLODipine (NORVASC) 5 MG tablet TAKE 1 TABLET BY MOUTH EVERY  DAY Patient taking differently: Take 5 mg by mouth daily.  04/04/18  Yes Martinique, Peter M, MD  aspirin EC 81 MG tablet Take 81 mg by mouth daily.   Yes [provider]  cyclobenzaprine (FLEXERIL) 10 MG tablet Take 1 tablet (10 mg total) by mouth 3 (three) times daily as needed for muscle spasms. 01/06/19  Yes Newman Pies, MD  docusate sodium (COLACE) 100 MG capsule Take 1 capsule (100 mg total) by mouth 2 (two) times daily. 01/06/19  Yes Newman Pies, MD  losartan (COZAAR) 100 MG tablet TAKE 1 TABLET BY MOUTH EVERY DAY Patient taking differently: Take 100 mg by mouth daily.  04/04/18  Yes Martinique, Peter M, MD  Melatonin 5 MG CAPS Take 5 mg by mouth at bedtime.    Yes [provider]  omeprazole (PRILOSEC) 20 MG capsule Take 20 mg by mouth every morning.    Yes [provider]  oxyCODONE 10 MG TABS Take 1 tablet (10 mg total) by mouth every 4 (four) hours as needed for severe pain ((score 7 to 10)). 01/06/19  Yes Newman Pies, MD  potassium citrate (UROCIT-K) 10 MEQ (1080 MG) SR tablet Take 20 mEq by mouth 2 (two) times daily.   Yes [provider]  pravastatin (PRAVACHOL) 40 MG tablet Take 40 mg by mouth at bedtime.  02/18/17  Yes [provider]  tamsulosin (FLOMAX) 0.4 MG CAPS capsule Take 0.4 mg by mouth at bedtime.  01/03/17  Yes [provider]    Family History Family History  Problem Relation Age of Onset   Cancer Father        pancreatic   Heart attack Brother     Social History Social History   Tobacco Use   Smoking status: Never Smoker   Smokeless tobacco: Never Used  Substance Use Topics   Alcohol use: No   Drug use: No     Allergies   Tramadol   Review of Systems Review of Systems  All other systems reviewed and are negative.    Physical Exam Updated Vital Signs BP 139/77    Pulse 76    Resp 10    Ht 5' 11.5" (1.816 m)    Wt 91.5 kg    SpO2 96%    BMI 27.75 kg/m   Physical Exam Vitals  signs and nursing note reviewed. Exam conducted with a chaperone present.  Constitutional:      Appearance: Normal appearance.     Comments: Teary-eyed.  HENT:     Head: Normocephalic and atraumatic.  Eyes:     General: No scleral icterus.    Conjunctiva/sclera: Conjunctivae normal.  Neck:     Musculoskeletal: Normal range of motion. No neck rigidity.  Cardiovascular:     Rate and  Rhythm: Normal rate and regular rhythm.     Pulses: Normal pulses.  Pulmonary:     Effort: Pulmonary effort is normal.     Breath sounds: Normal breath sounds.  Genitourinary:    Comments: Anorectal: No evidence of fissure or external hemorrhoids.  No tenderness to palpation on exterior aspect of anus.  Prostate is enlarged, but not tender.  No stool in the rectal vault.  Tenderness to palpation inside rectum.  No evidence of bright red blood or deoxygenated blood. Skin:    General: Skin is dry.  Neurological:     General: No focal deficit present.     Mental Status: He is alert.     GCS: GCS eye subscore is 4. GCS verbal subscore is 5. GCS motor subscore is 6.     Cranial Nerves: No cranial nerve deficit.     Sensory: No sensory deficit.     Motor: No weakness.     Coordination: Coordination normal.     Gait: Gait normal.  Psychiatric:        Mood and Affect: Mood normal.        Behavior: Behavior normal.        Thought Content: Thought content normal.      ED Treatments / Results  Labs (all labs ordered are listed, but only abnormal results are displayed) Labs Reviewed  COMPREHENSIVE METABOLIC PANEL - Abnormal; Notable for the following components:      Result Value   Glucose, Bld 138 (*)    BUN 25 (*)    Creatinine, Ser 1.62 (*)    GFR calc non Af Amer 41 (*)    GFR calc Af Amer 48 (*)    All other components within normal limits  POC OCCULT BLOOD, ED - Abnormal; Notable for the following components:   Fecal Occult Bld POSITIVE (*)    All other components within normal limits  CBC  WITH DIFFERENTIAL/PLATELET    EKG None  Radiology Ct Abdomen Pelvis W Contrast  Result Date: 01/17/2019 CLINICAL DATA:  High grade bowel obstruction, only 1 bowel movement since back surgery, rectal pain, on narcotics for pain from surgery EXAM: CT ABDOMEN AND PELVIS WITH CONTRAST TECHNIQUE: Multidetector CT imaging of the abdomen and pelvis was performed using the standard protocol following bolus administration of intravenous contrast. Sagittal and coronal MPR images reconstructed from axial data set. CONTRAST:  73mL OMNIPAQUE IOHEXOL 300 MG/ML SOLN IV. No oral contrast. COMPARISON:  01/25/2015 FINDINGS: Lower chest: Minimal atelectasis at RIGHT base dependently. Hepatobiliary: Gallbladder and liver normal appearance Pancreas: Few tiny parenchymal calcifications in pancreas. No pancreatic mass or ductal dilatation. Spleen: Normal appearance Adrenals/Urinary Tract: Adrenal glands normal appearance. BILATERAL nonobstructing renal calculi. Tiny BILATERAL renal cysts more numerous on LEFT. Cortical thinning of both kidneys. No hydronephrosis, hydroureter or ureteral calcification. 4 mm vesicular calculus. Small amount of air within urinary bladder question prior catheterization. Stomach/Bowel: Increased stool in rectum, minimally in proximal colon. Remaining bowel gas pattern and stool burden normal. Multiple medication tablets in colon and distal ileum. Stomach decompressed. Appendix surgically absent by history. No bowel dilatation or evidence of obstruction. Vascular/Lymphatic: Atherosclerotic calcifications aorta, visceral arteries, minimally iliac arteries and coronary arteries. Aorta normal caliber. No adenopathy. Reproductive: Prostatic enlargement Other: No free air or free fluid. No hernia or inflammatory process. Musculoskeletal: RIGHT hip prosthesis with beam hardening artifacts in pelvis. Prior lumbosacral fusion. IMPRESSION: BILATERAL renal cortical atrophy and tiny nonobstructing renal calculi.  Increased stool in rectum and minimally in proximal  colon. Multiple medication tablets in colon and distal ileum. No evidence of bowel obstruction or perforation. Prostatic enlargement. Electronically Signed   By: Lavonia Dana M.D.   On: 01/17/2019 16:22    Procedures Procedures (including critical care time)  Medications Ordered in ED Medications  HYDROmorphone (DILAUDID) injection 0.5 mg (0.5 mg Intravenous Given 01/17/19 1441)  iohexol (OMNIPAQUE) 300 MG/ML solution 75 mL (75 mLs Intravenous Contrast Given 01/17/19 1614)     Initial Impression / Assessment and Plan / ED Course  I have reviewed the triage vital signs and the nursing notes.  Pertinent labs & imaging results that were available during my care of the patient were reviewed by me and considered in my medical decision making (see chart for details).       Patient is having intermittent fecal urgency and difficulty fully voiding.  Digital rectal exam was relatively benign, no significant bright red blood per rectum or evidence of melena.  Fecal occult however was positive.  There is no significant stool in the rectum that would allow for disimpaction.  He states that he has not been eating very much this week which explains why there is not significant stool in rectum.  His CBC with differential was interpreted and is all within normal months.  No evidence of significant bleeding or a leukocytosis to suggest infection.  His CMP was also interpreted and is unremarkable.  His renal function is at his baseline.  CT abdomen pelvis was interpreted and demonstrates mildly increased stool in rectum and proximal colon, but no obstruction that would require decompression.  There is also no evidence of bowel perforation that would explain his discomfort.  Rectal tone is intact.  He has not been having any episodes of incontinence and he denies any saddle anesthesia.  Given his history of significant pain, was initially concerned for a  thrombosed hemorrhoid, but there were no external hemorrhoids or fissures evident on exam.  I also did not appreciate any masses during digital rectal exam.  After the work-up and evaluation, patient and his wife are both feeling very relieved and appreciated the exam.  Instructed patient to consume a bottle of magnesium citrate that he can procure over-the-counter to flush his bowels.  Once he is flushed, he may continue take his pain medication, as needed for his discomfort.  If his symptoms continue to persist, recommend that he reaches out to gastroenterology for further work-up and evaluation.  All of the evaluation and work-up results were discussed with the patient and any family at bedside. They were provided opportunity to ask any additional questions and have none at this time. They have expressed understanding of verbal discharge instructions as well as return precautions and are agreeable to the plan.    Final Clinical Impressions(s) / ED Diagnoses   Final diagnoses:  Constipation, unspecified constipation type    ED Discharge Orders    None       Corena Herter, PA-C 01/17/19 1730    Milton Ferguson, MD 01/18/19 1251

## 2019-01-17 NOTE — Discharge Instructions (Addendum)
Please consume a bottle of magnesium citrate that you can procure over-the-counter to flush your bowels.  Continue to hydrate.  If your symptoms continue to persist, I recommend that you reach out to gastroenterology for further work-up and evaluation.  Return to the ED seek medical attention if you develop any abdominal pain, fevers or chills, shortness of breath, numbness or incontinence, or any other new or worsening symptoms.

## 2019-01-17 NOTE — ED Triage Notes (Addendum)
Pt had back surgery 10/19, was prescribed narcotics for pain. Pt has only had one small bowel movement since operation. Thinks he may have strained himself, reporting intense rectal pain. Pt took miralax and colace with no relief.

## 2019-01-21 DIAGNOSIS — M5136 Other intervertebral disc degeneration, lumbar region: Secondary | ICD-10-CM | POA: Diagnosis not present

## 2019-01-21 DIAGNOSIS — M48062 Spinal stenosis, lumbar region with neurogenic claudication: Secondary | ICD-10-CM | POA: Diagnosis not present

## 2019-01-25 ENCOUNTER — Other Ambulatory Visit: Payer: Self-pay | Admitting: Cardiology

## 2019-02-10 DIAGNOSIS — R739 Hyperglycemia, unspecified: Secondary | ICD-10-CM | POA: Diagnosis not present

## 2019-02-10 DIAGNOSIS — E7849 Other hyperlipidemia: Secondary | ICD-10-CM | POA: Diagnosis not present

## 2019-02-10 DIAGNOSIS — Z125 Encounter for screening for malignant neoplasm of prostate: Secondary | ICD-10-CM | POA: Diagnosis not present

## 2019-02-17 DIAGNOSIS — N200 Calculus of kidney: Secondary | ICD-10-CM | POA: Diagnosis not present

## 2019-02-17 DIAGNOSIS — Z1339 Encounter for screening examination for other mental health and behavioral disorders: Secondary | ICD-10-CM | POA: Diagnosis not present

## 2019-02-17 DIAGNOSIS — C649 Malignant neoplasm of unspecified kidney, except renal pelvis: Secondary | ICD-10-CM | POA: Diagnosis not present

## 2019-02-17 DIAGNOSIS — N1831 Chronic kidney disease, stage 3a: Secondary | ICD-10-CM | POA: Diagnosis not present

## 2019-02-17 DIAGNOSIS — Z0000 Encounter for general adult medical examination without abnormal findings: Secondary | ICD-10-CM | POA: Diagnosis not present

## 2019-02-17 DIAGNOSIS — R739 Hyperglycemia, unspecified: Secondary | ICD-10-CM | POA: Diagnosis not present

## 2019-02-17 DIAGNOSIS — R972 Elevated prostate specific antigen [PSA]: Secondary | ICD-10-CM | POA: Diagnosis not present

## 2019-02-17 DIAGNOSIS — I129 Hypertensive chronic kidney disease with stage 1 through stage 4 chronic kidney disease, or unspecified chronic kidney disease: Secondary | ICD-10-CM | POA: Diagnosis not present

## 2019-02-17 DIAGNOSIS — E785 Hyperlipidemia, unspecified: Secondary | ICD-10-CM | POA: Diagnosis not present

## 2019-02-17 DIAGNOSIS — Z1331 Encounter for screening for depression: Secondary | ICD-10-CM | POA: Diagnosis not present

## 2019-03-24 DIAGNOSIS — T8131XA Disruption of external operation (surgical) wound, not elsewhere classified, initial encounter: Secondary | ICD-10-CM | POA: Diagnosis not present

## 2019-03-24 DIAGNOSIS — Z09 Encounter for follow-up examination after completed treatment for conditions other than malignant neoplasm: Secondary | ICD-10-CM | POA: Diagnosis not present

## 2019-04-01 DIAGNOSIS — M7582 Other shoulder lesions, left shoulder: Secondary | ICD-10-CM | POA: Diagnosis not present

## 2019-04-01 DIAGNOSIS — M25512 Pain in left shoulder: Secondary | ICD-10-CM | POA: Diagnosis not present

## 2019-04-07 DIAGNOSIS — N3943 Post-void dribbling: Secondary | ICD-10-CM | POA: Diagnosis not present

## 2019-04-07 DIAGNOSIS — R972 Elevated prostate specific antigen [PSA]: Secondary | ICD-10-CM | POA: Diagnosis not present

## 2019-04-07 DIAGNOSIS — D2272 Melanocytic nevi of left lower limb, including hip: Secondary | ICD-10-CM | POA: Diagnosis not present

## 2019-04-07 DIAGNOSIS — N401 Enlarged prostate with lower urinary tract symptoms: Secondary | ICD-10-CM | POA: Diagnosis not present

## 2019-04-07 DIAGNOSIS — D2261 Melanocytic nevi of right upper limb, including shoulder: Secondary | ICD-10-CM | POA: Diagnosis not present

## 2019-04-07 DIAGNOSIS — D225 Melanocytic nevi of trunk: Secondary | ICD-10-CM | POA: Diagnosis not present

## 2019-04-07 DIAGNOSIS — D1801 Hemangioma of skin and subcutaneous tissue: Secondary | ICD-10-CM | POA: Diagnosis not present

## 2019-04-07 DIAGNOSIS — L814 Other melanin hyperpigmentation: Secondary | ICD-10-CM | POA: Diagnosis not present

## 2019-04-07 DIAGNOSIS — L821 Other seborrheic keratosis: Secondary | ICD-10-CM | POA: Diagnosis not present

## 2019-04-07 DIAGNOSIS — D485 Neoplasm of uncertain behavior of skin: Secondary | ICD-10-CM | POA: Diagnosis not present

## 2019-04-07 DIAGNOSIS — Z85820 Personal history of malignant melanoma of skin: Secondary | ICD-10-CM | POA: Diagnosis not present

## 2019-04-14 NOTE — Progress Notes (Deleted)
Cardiology Office Note    Date:  04/14/2019   ID:  STEAVEN ARN, DOB 03-15-1946, MRN [id number]  PCP:  Leanna Battles, MD  Cardiologist: Dr. Martinique  No chief complaint on file.   History of Present Illness:    Corey [name] is a 74 y.o. male with past medical history of thoracic aortic aneurysm, Stage 3 CKD, chronic LBBB, and minimal CAD by cath in 2011 who presents for follow up   He was seen in 02/2016 and reported having dyspnea on exertion in the warmer temperatures but denied any associated chest discomfort. A repeat CTA was obtained in 03/2016 and showed a stable thoracic aortic aneurysm measuring 4.5cm in diameter with no acute changes (at 4.5 cm in 2015).   He was last seen in Dec 2018 for preoperative cardiac clearance for an  hip replacement. This was performed in January [id number] without complications. Repeat CT chest in February 2019 showed no change in thoracic aneurysm.  In October 2020 he underwent lumbar decompression and fusion for lumbar stenosis with Dr Arnoldo Morale.  On follow up today he is doing well.  No chest pain, palpitations, dizziness. Still gets SOB when exerting in hot weather[facility] and [facility] out on a bike or treadmill.    Past Medical History:  Diagnosis Date  . Arthritis 10-22-11   hx. osteoarthritis-knees  . Ascending aortic aneurysm [facility])    Last CT in July 2012  . 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 treated   . Coronary artery disease    minimal per cath in 2011  . Dyspnea   . GERD (gastroesophageal reflux disease)   . Heart murmur   . History of kidney stones    "YRS AGO"  nothing in last 10 yrs  . Hypertension   . Lactose intolerance    used to be, not anymore per patient.  . Left bundle branch block   . Nephrolithiasis     Past Surgical History:  Procedure Laterality Date  . APPENDECTOMY    . BACK SURGERY    . BUNIONECTOMY Right   . CARDIAC CATHETERIZATION  12/22/09   EF60%/minimal  nonobstructive atherosclerotic coronary artery disease/normal lt ventricular function/mod aortic root dilatation  . CYSTOSCOPY WITH RETROGRADE PYELOGRAM, URETEROSCOPY AND STENT PLACEMENT Left 07/28/2012   Procedure: CYSTOSCOPY WITH RETROGRADE PYELOGRAM, LEFT URETEROSCOPY ;  Surgeon: Dutch Gray, MD;  Location: [facility];  Service: Urology;  Laterality: Left;  . CYSTOSCOPY/RETROGRADE/URETEROSCOPY/STONE EXTRACTION WITH BASKET  10-22-11   1'12  . ELBOW SURGERY Right 1990s?   "tendon repaired"  . JOINT REPLACEMENT     both knees & R hip  . KNEE ARTHROSCOPY     "? side"  . LUMBAR SPINE SURGERY  2003   Lumbar fusion -retained hardware([name])  . PARTIAL NEPHRECTOMY Right 10/2011  . TONSILLECTOMY    . TOTAL HIP ARTHROPLASTY Right 03/26/2017   Procedure: RIGHT TOTAL HIP ARTHROPLASTY ANTERIOR APPROACH;  Surgeon: Renette Butters, MD;  Location: [place];  Service: Orthopedics;  Laterality: Right;  . TOTAL KNEE ARTHROPLASTY     bilateral    Current Medications: Outpatient Medications Prior to Visit  Medication Sig Dispense Refill  . acetaminophen (TYLENOL) 500 MG tablet Take 500 mg by mouth every 6 (six) hours as needed for moderate pain.    Marland Kitchen ALPRAZolam (XANAX) 0.25 MG tablet Take 0.25 mg by mouth at bedtime.     Marland Kitchen amLODipine (NORVASC) 5 MG  tablet Take 1 tablet (5 mg total) by mouth daily. 90 tablet 0  . aspirin EC 81 MG tablet Take 81 mg by mouth daily.    . cyclobenzaprine (FLEXERIL) 10 MG tablet Take 1 tablet (10 mg total) by mouth 3 (three) times daily as needed for muscle spasms. 50 tablet 0  . docusate sodium (COLACE) 100 MG capsule Take 1 capsule (100 mg total) by mouth 2 (two) times daily. 60 capsule 0  . losartan (COZAAR) 100 MG tablet Take 1 tablet (100 mg total) by mouth daily. 90 tablet 0  . Melatonin 5 MG CAPS Take 5 mg by mouth at bedtime.     Marland Kitchen omeprazole (PRILOSEC) 20 MG capsule Take 20 mg by mouth every morning.     Marland Kitchen oxyCODONE 10 MG TABS Take 1 tablet (10 mg total) by mouth every 4  (four) hours as needed for severe pain ((score 7 to 10)). 30 tablet 0  . potassium citrate (UROCIT-K) 10 MEQ (1080 MG) SR tablet Take 20 mEq by mouth 2 (two) times daily.    . pravastatin (PRAVACHOL) 40 MG tablet Take 40 mg by mouth at bedtime.   3  . tamsulosin (FLOMAX) 0.4 MG CAPS capsule Take 0.4 mg by mouth at bedtime.   11   No facility-administered medications prior to visit.     Allergies:   Tramadol   Social History   Socioeconomic History  . Marital status: Married    Spouse name: Not on file  . Number of children: 4  . Years of education: Not on file  . Highest education level: Not on file  Occupational History  . Occupation: sales [employer]  Tobacco Use  . Smoking status: Never Smoker  . Smokeless tobacco: Never Used  Substance and Sexual Activity  . Alcohol use: No  . Drug use: No  . Sexual activity: Yes  Other Topics Concern  . Not on file  Social History Narrative  . Not on file   Social Determinants of Health   Financial Resource Strain:   . Difficulty of Paying Living Expenses: Not on file  Food Insecurity:   . Worried About Charity fundraiser in the Last Year: Not on file  . Ran Out of Food in the Last Year: Not on file  Transportation Needs:   . Lack of Transportation (Medical): Not on file  . Lack of Transportation (Non-Medical): Not on file  Physical Activity:   . Days of Exercise per Week: Not on file  . Minutes of Exercise per Session: Not on file  Stress:   . Feeling of Stress : Not on file  Social Connections:   . Frequency of Communication with Friends and Family: Not on file  . Frequency of Social Gatherings with Friends and Family: Not on file  . Attends Religious Services: Not on file  . Active Member of Clubs or Organizations: Not on file  . Attends Archivist Meetings: Not on file  . Marital Status: Not on file     Family History:  The patient's family history includes Cancer in his father; Heart attack in his brother.    Review of Systems:   As noted in HPI. All other systems reviewed and are otherwise negative except as noted above.   Physical Exam:    VS:  There were no vitals taken for this visit.   GENERAL:  Well appearing WM in NAD HEENT:  PERRL, EOMI, sclera are clear. Oropharynx is clear. NECK:  No jugular  venous distention, carotid upstroke brisk and symmetric, no bruits, no thyromegaly or adenopathy LUNGS:  Clear to auscultation bilaterally CHEST:  Unremarkable HEART:  RRR,  PMI not displaced or sustained,S1 and S2 within normal limits, no S3, no S4: no clicks, no rubs, gr 2/6 systolic murmur RUSB. ABD:  Soft, nontender. BS +, no masses or bruits. No hepatomegaly, no splenomegaly EXT:  2 + pulses throughout, no edema, no cyanosis no clubbing SKIN:  Warm and dry.  No rashes NEURO:  Alert and oriented x 3. Cranial nerves II through XII intact. PSYCH:  Cognitively intact      Wt Readings from Last 3 Encounters:  01/17/19 201 lb 12.8 oz (91.5 kg)  01/05/19 201 lb 12.8 oz (91.5 kg)  12/30/18 201 lb 12.8 oz (91.5 kg)     Studies/Labs Reviewed:   EKG:  EKG is ordered today.  The ekg ordered today demonstrates NSR, HR 70, with known LBBB. LAD. I have personally reviewed and interpreted this study.   Recent Labs: 01/17/2019: ALT 26; BUN 25; Creatinine, Ser 1.62; Hemoglobin 15.6; Platelets 376; Potassium 4.4; Sodium 139   Lipid Panel No results found for: CHOL, TRIG, HDL, CHOLHDL, VLDL, LDLCALC, LDLDIRECT   Labs dated 01/31/18: cholesterol 147, triglycerides 130, HDL 44, LDL 77. A1c 5.1%. Creatinine 1.4. otherwise chemistries and Hgb normal.   Additional studies/ records that were reviewed today include:   Cardiac Catheterization: 2011    Echocardiogram: 07/2013 Study Conclusions  - Left ventricle: Abnormal septal motion. The cavity size was normal. Wall thickness was increased in a pattern of moderate LVH. Systolic function was normal. The estimated ejection fraction was  in the range of 50% to 55%. Doppler parameters are consistent with abnormal left ventricular relaxation (grade 1 diastolic dysfunction). - Atrial septum: No defect or patent foramen ovale was identified.   CT ANGIOGRAPHY CHEST WITH CONTRAST  TECHNIQUE: Multidetector CT imaging of the chest was performed using the standard protocol during bolus administration of intravenous contrast. Multiplanar CT image reconstructions and MIPs were obtained to evaluate the vascular anatomy.  CONTRAST:  51mL ISOVUE-370 IOPAMIDOL (ISOVUE-370) INJECTION 76%  COMPARISON:  Prior CTA of the chest 04/06/2016  FINDINGS: Cardiovascular: Stable fusiform aneurysmal dilatation of the ascending thoracic aorta of with a transverse diameter 4.5 cm. Conventional 3 vessel arch anatomy. No significant atherosclerotic plaque. The descending and transverse thoracic aorta are normal in caliber. No effacement of the sino-tubular junction. Unremarkable aortic root. The heart is normal in size. No pericardial effusion. Unremarkable pulmonary arteries.  Mediastinum/Nodes: Unremarkable CT appearance of the thyroid gland. No suspicious mediastinal or hilar adenopathy. No soft tissue mediastinal mass. The thoracic esophagus is unremarkable.  Lungs/Pleura: Lungs are clear. No pleural effusion or pneumothorax.  Upper Abdomen: Visualized upper abdominal organs are unremarkable. Stable hyperdense cyst in the upper pole of the right kidney compared to 01/25/2015.  Musculoskeletal: No acute fracture or aggressive appearing lytic or blastic osseous lesion.  Review of the MIP images confirms the above findings.  IMPRESSION: Stable 4.5 cm fusiform ascending thoracic aortic aneurysm. No interval change. Ascending thoracic aortic aneurysm. Recommend semi-annual imaging followup by CTA or MRA and referral to cardiothoracic surgery if not already obtained. This recommendation follows 2010  ACCF/AHA/AATS/ACR/ASA/SCA/SCAI/SIR/STS/SVM Guidelines for the Diagnosis and Management of Patients With Thoracic Aortic Disease. Circulation. 2010; [id number]  Signed,  Criselda Peaches, MD  [facility]  [facility]   Electronically Signed   By: Jacqulynn Cadet M.D.   On: 04/29/2017 16:38  Assessment:    No diagnosis found.   Plan:   In order of problems listed above:  1. HTN - BP is well-under fair control.  - continue Amlodipine 5mg  daily and Losartan 100mg  daily. Try and minimize use of NSAIDs with CKD.   2. LBBB - chronic for 20+ years. Catheterization in 2011 showed minimal CAD.  - continue ASA and statin therapy.   3. HLD - followed by PCP. Remains on Pravastatin 20mg  daily. Excellent control.  4. Thoracic Aortic Aneurysm - CTA in 04/2017 and showed a stable thoracic aortic aneurysm measuring 4.5cm in diameter with no acute changes. Unchanged on serial CT dating back to 2011. Will reduce frequency of CT to every other year.    5. Stage 3 CKD - baseline creatinine 1.5 - 1.6. maintain good hydration.     Signed, Peter Martinique, MD  04/14/2019 1:30 PM    [place] [address] [place], [place] [address] Phone: [phone]

## 2019-04-20 DIAGNOSIS — Z6827 Body mass index (BMI) 27.0-27.9, adult: Secondary | ICD-10-CM | POA: Diagnosis not present

## 2019-04-20 DIAGNOSIS — M79659 Pain in unspecified thigh: Secondary | ICD-10-CM | POA: Insufficient documentation

## 2019-04-20 DIAGNOSIS — M48062 Spinal stenosis, lumbar region with neurogenic claudication: Secondary | ICD-10-CM | POA: Diagnosis not present

## 2019-04-20 DIAGNOSIS — M79652 Pain in left thigh: Secondary | ICD-10-CM | POA: Diagnosis not present

## 2019-04-23 ENCOUNTER — Ambulatory Visit: Payer: Medicare HMO | Admitting: Cardiology

## 2019-05-06 ENCOUNTER — Ambulatory Visit: Payer: Medicare HMO | Attending: Internal Medicine

## 2019-05-06 DIAGNOSIS — Z20822 Contact with and (suspected) exposure to covid-19: Secondary | ICD-10-CM | POA: Diagnosis not present

## 2019-05-07 LAB — NOVEL CORONAVIRUS, NAA: SARS-CoV-2, NAA: NOT DETECTED

## 2019-05-08 NOTE — Progress Notes (Signed)
Cardiology Office Note    Date:  05/12/2019   ID:  Corey [name], DOB 1945-05-20, MRN [id number]  PCP:  Leanna Battles, MD  Cardiologist: Dr. Martinique  Chief Complaint  Patient presents with  . Thoracic Aortic Aneurysm  . Hypertension    History of Present Illness:    Corey [name] is a 74 y.o. male with past medical history of thoracic aortic aneurysm, Stage 3 CKD, chronic LBBB, and minimal CAD by cath in 2011 who presents for follow up   He was seen in 02/2016 and reported having dyspnea on exertion in the warmer temperatures but denied any associated chest discomfort. A repeat CTA was obtained in 03/2016 and showed a stable thoracic aortic aneurysm measuring 4.5cm in diameter with no acute changes (at 4.5 cm in 2015).   He was last seen in Dec 2018 for preoperative cardiac clearance for an  hip replacement. This was performed in January [id number] without complications. Repeat CT chest in February 2019 showed no change in thoracic aneurysm.  In October 2020 he underwent lumbar decompression and fusion for lumbar stenosis with Dr Arnoldo Morale.  On follow up today he is doing well. He does note his son passed away suddenly in March 01, 2023 with PNA. He is recovering from his lumbar surgery well. Is on gabapentin for neuropathic pain and notes some double vision with this. BP is well controlled. No chest pain or dyspnea. Exercising on recumbent bike.    Past Medical History:  Diagnosis Date  . Arthritis 10-22-11   hx. osteoarthritis-knees  . Ascending aortic aneurysm [facility])    Last CT in July 2012  . 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 treated   . Coronary artery disease    minimal per cath in 2011  . Dyspnea   . GERD (gastroesophageal reflux disease)   . Heart murmur   . History of kidney stones    "YRS AGO"  nothing in last 10 yrs  . Hypertension   . Lactose intolerance    used to be, not anymore per patient.  . Left bundle branch block   .  Nephrolithiasis     Past Surgical History:  Procedure Laterality Date  . APPENDECTOMY    . BACK SURGERY    . BUNIONECTOMY Right   . CARDIAC CATHETERIZATION  12/22/09   EF60%/minimal nonobstructive atherosclerotic coronary artery disease/normal lt ventricular function/mod aortic root dilatation  . CYSTOSCOPY WITH RETROGRADE PYELOGRAM, URETEROSCOPY AND STENT PLACEMENT Left 07/28/2012   Procedure: CYSTOSCOPY WITH RETROGRADE PYELOGRAM, LEFT URETEROSCOPY ;  Surgeon: Dutch Gray, MD;  [facility];  Service: Urology;  Laterality: Left;  . CYSTOSCOPY/RETROGRADE/URETEROSCOPY/STONE EXTRACTION WITH BASKET  10-22-11   1'12  . ELBOW SURGERY Right 1990s?   "tendon repaired"  . JOINT REPLACEMENT     both knees & R hip  . KNEE ARTHROSCOPY     "? side"  . LUMBAR SPINE SURGERY  2003   Lumbar fusion -retained hardware([name])  . PARTIAL NEPHRECTOMY Right 10/2011  . TONSILLECTOMY    . TOTAL HIP ARTHROPLASTY Right 03/26/2017   Procedure: RIGHT TOTAL HIP ARTHROPLASTY ANTERIOR APPROACH;  Surgeon: Renette Butters, MD;  Location: [place];  Service: Orthopedics;  Laterality: Right;  . TOTAL KNEE ARTHROPLASTY     bilateral    Current Medications: Outpatient Medications Prior to Visit  Medication Sig Dispense Refill  . acetaminophen (TYLENOL) 500 MG tablet Take 500 mg by mouth every 6 (six) hours as  needed for moderate pain.    Marland Kitchen ALPRAZolam (XANAX) 0.25 MG tablet Take 0.25 mg by mouth at bedtime.     Marland Kitchen amLODipine (NORVASC) 5 MG tablet Take 1 tablet (5 mg total) by mouth daily. 90 tablet 0  . aspirin EC 81 MG tablet Take 81 mg by mouth daily.    . cyclobenzaprine (FLEXERIL) 10 MG tablet Take 1 tablet (10 mg total) by mouth 3 (three) times daily as needed for muscle spasms. 50 tablet 0  . docusate sodium (COLACE) 100 MG capsule Take 1 capsule (100 mg total) by mouth 2 (two) times daily. 60 capsule 0  . losartan (COZAAR) 100 MG tablet Take 1 tablet (100 mg total) by mouth daily. 90 tablet 0  . Melatonin 5  MG CAPS Take 5 mg by mouth at bedtime.     Marland Kitchen omeprazole (PRILOSEC) 20 MG capsule Take 20 mg by mouth every morning.     Marland Kitchen oxyCODONE 10 MG TABS Take 1 tablet (10 mg total) by mouth every 4 (four) hours as needed for severe pain ((score 7 to 10)). 30 tablet 0  . potassium citrate (UROCIT-K) 10 MEQ (1080 MG) SR tablet Take 20 mEq by mouth 2 (two) times daily.    . pravastatin (PRAVACHOL) 40 MG tablet Take 40 mg by mouth at bedtime.   3  . tamsulosin (FLOMAX) 0.4 MG CAPS capsule Take 0.4 mg by mouth at bedtime.   11   No facility-administered medications prior to visit.     Allergies:   Tramadol   Social History   Socioeconomic History  . Marital status: Married    Spouse name: Not on file  . Number of children: 4  . Years of education: Not on file  . Highest education level: Not on file  Occupational History  . Occupation: sales [employer]  Tobacco Use  . Smoking status: Never Smoker  . Smokeless tobacco: Never Used  Substance and Sexual Activity  . Alcohol use: No  . Drug use: No  . Sexual activity: Yes  Other Topics Concern  . Not on file  Social History Narrative  . Not on file   Social Determinants of Health   Financial Resource Strain:   . Difficulty of Paying Living Expenses: Not on file  Food Insecurity:   . Worried About Charity fundraiser in the Last Year: Not on file  . Ran Out of Food in the Last Year: Not on file  Transportation Needs:   . Lack of Transportation (Medical): Not on file  . Lack of Transportation (Non-Medical): Not on file  Physical Activity:   . Days of Exercise per Week: Not on file  . Minutes of Exercise per Session: Not on file  Stress:   . Feeling of Stress : Not on file  Social Connections:   . Frequency of Communication with Friends and Family: Not on file  . Frequency of Social Gatherings with Friends and Family: Not on file  . Attends Religious Services: Not on file  . Active Member of Clubs or Organizations: Not on file  . Attends  Archivist Meetings: Not on file  . Marital Status: Not on file     Family History:  The patient's family history includes Cancer in his father; Heart attack in his brother.   Review of Systems:   As noted in HPI. All other systems reviewed and are otherwise negative except as noted above.   Physical Exam:    VS:  BP 126/78 (  BP Location: Right Arm, Cuff Size: Normal)   Pulse 72   Temp (!) 97.5 F (36.4 C)   Ht 5' 11.5" (1.816 m)   Wt 203 lb (92.1 kg)   SpO2 94%   BMI 27.92 kg/m    GENERAL:  Well appearing WM in NAD HEENT:  PERRL, EOMI, sclera are clear. Oropharynx is clear. NECK:  No jugular venous distention, carotid upstroke brisk and symmetric, no bruits, no thyromegaly or adenopathy LUNGS:  Clear to auscultation bilaterally CHEST:  Unremarkable HEART:  RRR,  PMI not displaced or sustained,S1 and S2 within normal limits, no S3, no S4: no clicks, no rubs, gr 2/6 systolic murmur RUSB. ABD:  Soft, nontender. BS +, no masses or bruits. No hepatomegaly, no splenomegaly EXT:  2 + pulses throughout, no edema, no cyanosis no clubbing SKIN:  Warm and dry.  No rashes NEURO:  Alert and oriented x 3. Cranial nerves II through XII intact. PSYCH:  Cognitively intact      Wt Readings from Last 3 Encounters:  05/12/19 203 lb (92.1 kg)  01/17/19 201 lb 12.8 oz (91.5 kg)  01/05/19 201 lb 12.8 oz (91.5 kg)     Studies/Labs Reviewed:   EKG:  EKG is ordered today.  The ekg ordered today demonstrates NSR, HR 72, with known LBBB. LAD. I have personally reviewed and interpreted this study.   Recent Labs: 01/17/2019: ALT 26; BUN 25; Creatinine, Ser 1.62; Hemoglobin 15.6; Platelets 376; Potassium 4.4; Sodium 139   Lipid Panel No results found for: CHOL, TRIG, HDL, CHOLHDL, VLDL, LDLCALC, LDLDIRECT   Labs dated 01/31/18: cholesterol 147, triglycerides 130, HDL 44, LDL 77. A1c 5.1%. Creatinine 1.4. otherwise chemistries and Hgb normal.  Dated 02/10/19: cholesterol 136,  triglycerides 118, HDL 47, LDL 65. A1c 5.2%. creatinine 1.2. otherwise CMET and CBC normal  Additional studies/ records that were reviewed today include:   Cardiac Catheterization: 2011    Echocardiogram: 07/2013 Study Conclusions  - Left ventricle: Abnormal septal motion. The cavity size was normal. Wall thickness was increased in a pattern of moderate LVH. Systolic function was normal. The estimated ejection fraction was in the range of 50% to 55%. Doppler parameters are consistent with abnormal left ventricular relaxation (grade 1 diastolic dysfunction). - Atrial septum: No defect or patent foramen ovale was identified.   CT ANGIOGRAPHY CHEST WITH CONTRAST  TECHNIQUE: Multidetector CT imaging of the chest was performed using the standard protocol during bolus administration of intravenous contrast. Multiplanar CT image reconstructions and MIPs were obtained to evaluate the vascular anatomy.  CONTRAST:  21mL ISOVUE-370 IOPAMIDOL (ISOVUE-370) INJECTION 76%  COMPARISON:  Prior CTA of the chest 04/06/2016  FINDINGS: Cardiovascular: Stable fusiform aneurysmal dilatation of the ascending thoracic aorta of with a transverse diameter 4.5 cm. Conventional 3 vessel arch anatomy. No significant atherosclerotic plaque. The descending and transverse thoracic aorta are normal in caliber. No effacement of the sino-tubular junction. Unremarkable aortic root. The heart is normal in size. No pericardial effusion. Unremarkable pulmonary arteries.  Mediastinum/Nodes: Unremarkable CT appearance of the thyroid gland. No suspicious mediastinal or hilar adenopathy. No soft tissue mediastinal mass. The thoracic esophagus is unremarkable.  Lungs/Pleura: Lungs are clear. No pleural effusion or pneumothorax.  Upper Abdomen: Visualized upper abdominal organs are unremarkable. Stable hyperdense cyst in the upper pole of the right kidney compared to  01/25/2015.  Musculoskeletal: No acute fracture or aggressive appearing lytic or blastic osseous lesion.  Review of the MIP images confirms the above findings.  IMPRESSION: Stable 4.5 cm fusiform  ascending thoracic aortic aneurysm. No interval change. Ascending thoracic aortic aneurysm. Recommend semi-annual imaging followup by CTA or MRA and referral to cardiothoracic surgery if not already obtained. This recommendation follows 2010 ACCF/AHA/AATS/ACR/ASA/SCA/SCAI/SIR/STS/SVM Guidelines for the Diagnosis and Management of Patients With Thoracic Aortic Disease. Circulation. 2010; [id number]  Signed,  Criselda Peaches, MD  [facility]  [facility]   Electronically Signed   By: Jacqulynn Cadet M.D.   On: 04/29/2017 16:38   Assessment:    1. Thoracic aortic aneurysm without rupture ([place])   2. Essential hypertension   3. LBBB (left bundle branch block)   4. Hyperlipidemia LDL goal <70      Plan:   In order of problems listed above:  1. HTN - BP is well controlled. - continue Amlodipine 5mg  daily and Losartan 100mg  daily. Try and minimize use of NSAIDs with CKD.   2. LBBB - chronic for 20+ years. Catheterization in 2011 showed minimal CAD.  - continue ASA and statin therapy.   3. HLD - followed by PCP. Remains on Pravastatin 20mg  daily. Excellent control.  4. Thoracic Aortic Aneurysm - CTA in 04/2017 and showed a stable thoracic aortic aneurysm measuring 4.5cm in diameter with no acute changes. We are checking this every other year so will arrange repeat CTA now.   5. Stage 3 CKD - baseline creatinine 1.5 - 1.6. maintain good hydration. Last creatinine 1.2.     Signed, Antonio Woodhams Martinique, MD  05/12/2019 11:26 AM    [place] [address], [place], [place] [address] Phone: [phone]

## 2019-05-12 ENCOUNTER — Encounter: Payer: Self-pay | Admitting: Cardiology

## 2019-05-12 ENCOUNTER — Ambulatory Visit: Payer: Medicare HMO | Admitting: Cardiology

## 2019-05-12 ENCOUNTER — Other Ambulatory Visit: Payer: Self-pay

## 2019-05-12 VITALS — BP 126/78 | HR 72 | Temp 97.5°F | Ht 71.5 in | Wt 203.0 lb

## 2019-05-12 DIAGNOSIS — I447 Left bundle-branch block, unspecified: Secondary | ICD-10-CM

## 2019-05-12 DIAGNOSIS — I10 Essential (primary) hypertension: Secondary | ICD-10-CM

## 2019-05-12 DIAGNOSIS — E785 Hyperlipidemia, unspecified: Secondary | ICD-10-CM | POA: Diagnosis not present

## 2019-05-12 DIAGNOSIS — I712 Thoracic aortic aneurysm, without rupture, unspecified: Secondary | ICD-10-CM

## 2019-05-12 NOTE — Patient Instructions (Signed)
Medication Instructions:  Continue same medications *If you need a refill on your cardiac medications before your next appointment, please call your pharmacy*  Lab Work: Bmet today   Testing/Procedures: Schedule Chest CT   Follow-Up: At [facility], you and your health needs are our priority.  As part of our continuing mission to provide you with exceptional heart care, we have created designated Provider Care Teams.  These Care Teams include your primary Cardiologist (physician) and Advanced Practice Providers (APPs -  Physician Assistants and Nurse Practitioners) who all work together to provide you with the care you need, when you need it.  Your next appointment:  1 Year   Call in Oct to schedule Feb appointment   The format for your next appointment:Office     Provider:  Dr.Jordan

## 2019-05-13 ENCOUNTER — Other Ambulatory Visit: Payer: Self-pay

## 2019-05-13 LAB — BASIC METABOLIC PANEL
BUN/Creatinine Ratio: 17 (ref 10–24)
BUN: 22 mg/dL (ref 8–27)
CO2: 23 mmol/L (ref 20–29)
Calcium: 9.4 mg/dL (ref 8.6–10.2)
Chloride: 107 mmol/L — ABNORMAL HIGH (ref 96–106)
Creatinine, Ser: 1.29 mg/dL — ABNORMAL HIGH (ref 0.76–1.27)
GFR calc Af Amer: 63 mL/min/{1.73_m2} (ref >59)
GFR calc non Af Amer: 55 mL/min/{1.73_m2} — ABNORMAL LOW (ref >59)
Glucose: 79 mg/dL (ref 65–99)
Potassium: 5.1 mmol/L (ref 3.5–5.2)
Sodium: 144 mmol/L (ref 134–144)

## 2019-05-13 MED ORDER — LOSARTAN POTASSIUM 100 MG PO TABS: 100.0000 mg | ORAL_TABLET | Freq: Every day | ORAL | 0 refills | Status: DC

## 2019-05-19 DIAGNOSIS — L988 Other specified disorders of the skin and subcutaneous tissue: Secondary | ICD-10-CM | POA: Diagnosis not present

## 2019-05-19 DIAGNOSIS — D485 Neoplasm of uncertain behavior of skin: Secondary | ICD-10-CM | POA: Diagnosis not present

## 2019-05-19 DIAGNOSIS — Z85820 Personal history of malignant melanoma of skin: Secondary | ICD-10-CM | POA: Diagnosis not present

## 2019-05-20 ENCOUNTER — Other Ambulatory Visit: Payer: Self-pay

## 2019-05-20 ENCOUNTER — Ambulatory Visit (HOSPITAL_COMMUNITY)
Admission: RE | Admit: 2019-05-20 | Discharge: 2019-05-20 | Disposition: A | Discharge status: Home / Self Care | Payer: Medicare HMO | Source: Ambulatory Visit | Attending: Cardiology | Admitting: Cardiology

## 2019-05-20 DIAGNOSIS — I712 Thoracic aortic aneurysm, without rupture, unspecified: Secondary | ICD-10-CM

## 2019-05-20 MED ORDER — IOHEXOL 350 MG/ML SOLN
100.0000 mL | Freq: Once | INTRAVENOUS | Status: AC | PRN
  Administered 2019-05-20: 100 mL via INTRAVENOUS

## 2019-05-28 ENCOUNTER — Other Ambulatory Visit: Payer: Self-pay

## 2019-05-28 MED ORDER — AMLODIPINE BESYLATE 5 MG PO TABS: 5.0000 mg | ORAL_TABLET | Freq: Every day | ORAL | 0 refills | Status: DC

## 2019-05-29 DIAGNOSIS — M25512 Pain in left shoulder: Secondary | ICD-10-CM | POA: Diagnosis not present

## 2019-06-03 DIAGNOSIS — M7542 Impingement syndrome of left shoulder: Secondary | ICD-10-CM | POA: Diagnosis not present

## 2019-06-03 DIAGNOSIS — M25512 Pain in left shoulder: Secondary | ICD-10-CM | POA: Diagnosis not present

## 2019-06-03 DIAGNOSIS — S46012D Strain of muscle(s) and tendon(s) of the rotator cuff of left shoulder, subsequent encounter: Secondary | ICD-10-CM | POA: Diagnosis not present

## 2019-06-10 DIAGNOSIS — M7542 Impingement syndrome of left shoulder: Secondary | ICD-10-CM | POA: Diagnosis not present

## 2019-06-10 DIAGNOSIS — S46012D Strain of muscle(s) and tendon(s) of the rotator cuff of left shoulder, subsequent encounter: Secondary | ICD-10-CM | POA: Diagnosis not present

## 2019-06-10 DIAGNOSIS — M25512 Pain in left shoulder: Secondary | ICD-10-CM | POA: Diagnosis not present

## 2019-06-17 DIAGNOSIS — M7542 Impingement syndrome of left shoulder: Secondary | ICD-10-CM | POA: Diagnosis not present

## 2019-06-17 DIAGNOSIS — M25512 Pain in left shoulder: Secondary | ICD-10-CM | POA: Diagnosis not present

## 2019-06-17 DIAGNOSIS — S46012D Strain of muscle(s) and tendon(s) of the rotator cuff of left shoulder, subsequent encounter: Secondary | ICD-10-CM | POA: Diagnosis not present

## 2019-06-23 DIAGNOSIS — M48062 Spinal stenosis, lumbar region with neurogenic claudication: Secondary | ICD-10-CM | POA: Diagnosis not present

## 2019-06-24 DIAGNOSIS — M7542 Impingement syndrome of left shoulder: Secondary | ICD-10-CM | POA: Diagnosis not present

## 2019-06-24 DIAGNOSIS — S46012D Strain of muscle(s) and tendon(s) of the rotator cuff of left shoulder, subsequent encounter: Secondary | ICD-10-CM | POA: Diagnosis not present

## 2019-06-24 DIAGNOSIS — M25512 Pain in left shoulder: Secondary | ICD-10-CM | POA: Diagnosis not present

## 2019-07-10 DIAGNOSIS — M25512 Pain in left shoulder: Secondary | ICD-10-CM | POA: Diagnosis not present

## 2019-08-04 ENCOUNTER — Other Ambulatory Visit: Payer: Self-pay | Admitting: Cardiology

## 2019-08-21 ENCOUNTER — Other Ambulatory Visit: Payer: Self-pay | Admitting: Cardiology

## 2019-10-06 DIAGNOSIS — L738 Other specified follicular disorders: Secondary | ICD-10-CM | POA: Diagnosis not present

## 2019-10-06 DIAGNOSIS — Z85820 Personal history of malignant melanoma of skin: Secondary | ICD-10-CM | POA: Diagnosis not present

## 2019-10-06 DIAGNOSIS — L814 Other melanin hyperpigmentation: Secondary | ICD-10-CM | POA: Diagnosis not present

## 2019-10-06 DIAGNOSIS — D1801 Hemangioma of skin and subcutaneous tissue: Secondary | ICD-10-CM | POA: Diagnosis not present

## 2019-10-06 DIAGNOSIS — D3612 Benign neoplasm of peripheral nerves and autonomic nervous system, upper limb, including shoulder: Secondary | ICD-10-CM | POA: Diagnosis not present

## 2019-10-06 DIAGNOSIS — L821 Other seborrheic keratosis: Secondary | ICD-10-CM | POA: Diagnosis not present

## 2019-10-06 DIAGNOSIS — D225 Melanocytic nevi of trunk: Secondary | ICD-10-CM | POA: Diagnosis not present

## 2019-10-23 ENCOUNTER — Encounter (HOSPITAL_COMMUNITY): Payer: Self-pay | Admitting: Emergency Medicine

## 2019-10-23 ENCOUNTER — Emergency Department (HOSPITAL_COMMUNITY)
Admission: EM | Admit: 2019-10-23 | Discharge: 2019-10-23 | Disposition: A | Discharge status: Home / Self Care | Payer: Medicare HMO | Attending: Emergency Medicine | Admitting: Emergency Medicine

## 2019-10-23 ENCOUNTER — Other Ambulatory Visit: Payer: Self-pay

## 2019-10-23 ENCOUNTER — Emergency Department (HOSPITAL_COMMUNITY): Payer: Medicare HMO

## 2019-10-23 DIAGNOSIS — R109 Unspecified abdominal pain: Secondary | ICD-10-CM

## 2019-10-23 DIAGNOSIS — Z85820 Personal history of malignant melanoma of skin: Secondary | ICD-10-CM | POA: Insufficient documentation

## 2019-10-23 DIAGNOSIS — N210 Calculus in bladder: Secondary | ICD-10-CM | POA: Diagnosis not present

## 2019-10-23 DIAGNOSIS — R1032 Left lower quadrant pain: Secondary | ICD-10-CM | POA: Diagnosis not present

## 2019-10-23 DIAGNOSIS — Z96653 Presence of artificial knee joint, bilateral: Secondary | ICD-10-CM | POA: Diagnosis not present

## 2019-10-23 DIAGNOSIS — I2510 Atherosclerotic heart disease of native coronary artery without angina pectoris: Secondary | ICD-10-CM | POA: Diagnosis not present

## 2019-10-23 DIAGNOSIS — N200 Calculus of kidney: Secondary | ICD-10-CM | POA: Diagnosis not present

## 2019-10-23 DIAGNOSIS — I129 Hypertensive chronic kidney disease with stage 1 through stage 4 chronic kidney disease, or unspecified chronic kidney disease: Secondary | ICD-10-CM | POA: Diagnosis not present

## 2019-10-23 DIAGNOSIS — N281 Cyst of kidney, acquired: Secondary | ICD-10-CM | POA: Diagnosis not present

## 2019-10-23 DIAGNOSIS — N183 Chronic kidney disease, stage 3 unspecified: Secondary | ICD-10-CM | POA: Diagnosis not present

## 2019-10-23 DIAGNOSIS — I700 Atherosclerosis of aorta: Secondary | ICD-10-CM | POA: Diagnosis not present

## 2019-10-23 DIAGNOSIS — Z96641 Presence of right artificial hip joint: Secondary | ICD-10-CM | POA: Insufficient documentation

## 2019-10-23 DIAGNOSIS — K219 Gastro-esophageal reflux disease without esophagitis: Secondary | ICD-10-CM | POA: Insufficient documentation

## 2019-10-23 DIAGNOSIS — Z85528 Personal history of other malignant neoplasm of kidney: Secondary | ICD-10-CM | POA: Insufficient documentation

## 2019-10-23 DIAGNOSIS — Z79899 Other long term (current) drug therapy: Secondary | ICD-10-CM | POA: Insufficient documentation

## 2019-10-23 LAB — COMPREHENSIVE METABOLIC PANEL
ALT: 25 U/L (ref 0–44)
AST: 23 U/L (ref 15–41)
Albumin: 4.1 g/dL (ref 3.5–5.0)
Alkaline Phosphatase: 104 U/L (ref 38–126)
Anion gap: 12 (ref 5–15)
BUN: 28 mg/dL — ABNORMAL HIGH (ref 8–23)
CO2: 22 mmol/L (ref 22–32)
Calcium: 9.6 mg/dL (ref 8.9–10.3)
Chloride: 107 mmol/L (ref 98–111)
Creatinine, Ser: 1.98 mg/dL — ABNORMAL HIGH (ref 0.61–1.24)
GFR calc Af Amer: 38 mL/min — ABNORMAL LOW (ref >60)
GFR calc non Af Amer: 33 mL/min — ABNORMAL LOW (ref >60)
Glucose, Bld: 127 mg/dL — ABNORMAL HIGH (ref 70–99)
Potassium: 4.8 mmol/L (ref 3.5–5.1)
Sodium: 141 mmol/L (ref 135–145)
Total Bilirubin: 1.6 mg/dL — ABNORMAL HIGH (ref 0.3–1.2)
Total Protein: 6.8 g/dL (ref 6.5–8.1)

## 2019-10-23 LAB — CBC
HCT: 43.8 % (ref 39.0–52.0)
Hemoglobin: 15.1 g/dL (ref 13.0–17.0)
MCH: 32 pg (ref 26.0–34.0)
MCHC: 34.5 g/dL (ref 30.0–36.0)
MCV: 92.8 fL (ref 80.0–100.0)
Platelets: 201 10*3/uL (ref 150–400)
RBC: 4.72 MIL/uL (ref 4.22–5.81)
RDW: 12.9 % (ref 11.5–15.5)
WBC: 4.6 10*3/uL (ref 4.0–10.5)
nRBC: 0 % (ref 0.0–0.2)

## 2019-10-23 LAB — URINALYSIS, ROUTINE W REFLEX MICROSCOPIC
Bacteria, UA: NONE SEEN
Glucose, UA: NEGATIVE mg/dL
Hgb urine dipstick: NEGATIVE
Ketones, ur: NEGATIVE mg/dL
Leukocytes,Ua: NEGATIVE
Nitrite: NEGATIVE
Protein, ur: 30 mg/dL — AB
Specific Gravity, Urine: 1.024 (ref 1.005–1.030)
pH: 5 (ref 5.0–8.0)

## 2019-10-23 LAB — LIPASE, BLOOD: Lipase: 33 U/L (ref 11–51)

## 2019-10-23 MED ORDER — SODIUM CHLORIDE 0.9% FLUSH: 3.0000 mL | Freq: Once | INTRAVENOUS | Status: DC

## 2019-10-23 MED ORDER — SODIUM CHLORIDE 0.9 % IV BOLUS
1000.0000 mL | Freq: Once | INTRAVENOUS | Status: AC
  Administered 2019-10-23: 1000 mL via INTRAVENOUS

## 2019-10-23 NOTE — ED Triage Notes (Signed)
Pt reports LLQ pain that began x1 week, reports that today pain became worse and more sharp in nature while eating. Denies constipation or diarrhea, urinary symptoms.

## 2019-10-23 NOTE — Discharge Instructions (Addendum)
Follow-up with your primary care provider. Take Tylenol as needed for pain. Return to the ER if you develop a fever, chest pain, shortness of breath, uncontrollable vomiting.

## 2019-10-23 NOTE — ED Provider Notes (Signed)
[facility] EMERGENCY DEPARTMENT Provider Note   CSN: [id number] Arrival date & time: 10/23/19  1205     History Chief Complaint  Patient presents with  . Abdominal Pain    Corey [name] is a 74 y.o. male with a past medical history of GERD, kidney stones, hypertension, thoracic aortic aneurysm stable at 4.3 cm seen on recent imaging in March 2021 presenting to the ED with a chief complaint of abdominal pain.  Reports intermittent abdominal pain for the past week.  He initially thought it was due to muscle strain.  This afternoon after eating, started having a sharp pain in his left side and left lower quadrant.  States that this feels similar to his prior kidney stones although he has not had kidney stones in several years.  Denies any nausea, vomiting, changes to bowel movements, dysuria, hematuria, chest pain, shortness of breath.  He has not tried any medications to help with his symptoms.  He does note trying to recently lose weight and he is concerned that he has not been staying adequately hydrated.  HPI     Past Medical History:  Diagnosis Date  . Arthritis 10-22-11   hx. osteoarthritis-knees  . Ascending aortic aneurysm [facility])    Last CT in July 2012  . 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 treated   . Coronary artery disease    minimal per cath in 2011  . Dyspnea   . GERD (gastroesophageal reflux disease)   . Heart murmur   . History of kidney stones    "YRS AGO"  nothing in last 10 yrs  . Hypertension   . Lactose intolerance    used to be, not anymore per patient.  . Left bundle branch block   . Nephrolithiasis     Patient Active Problem List   Diagnosis Date Noted  . Lumbar stenosis with neurogenic claudication 01/05/2019  . Primary osteoarthritis of right hip 03/26/2017  . CKD (chronic kidney disease) stage 3, GFR 30-59 ml/min 03/04/2017  . Coronary artery disease   . Chest pain   . DYSPNEA 02/01/2010  . LACTOSE  INTOLERANCE 01/31/2010  . Essential hypertension 01/31/2010  . LBBB (left bundle branch block) 01/31/2010  . Aneurysm of thoracic aorta ([place]) 01/31/2010  . G E R D 01/31/2010    Past Surgical History:  Procedure Laterality Date  . APPENDECTOMY    . BACK SURGERY    . BUNIONECTOMY Right   . CARDIAC CATHETERIZATION  12/22/09   EF60%/minimal nonobstructive atherosclerotic coronary artery disease/normal lt ventricular function/mod aortic root dilatation  . CYSTOSCOPY WITH RETROGRADE PYELOGRAM, URETEROSCOPY AND STENT PLACEMENT Left 07/28/2012   Procedure: CYSTOSCOPY WITH RETROGRADE PYELOGRAM, LEFT URETEROSCOPY ;  Surgeon: Dutch Gray, MD;  Location: [facility];  Service: Urology;  Laterality: Left;  . CYSTOSCOPY/RETROGRADE/URETEROSCOPY/STONE EXTRACTION WITH BASKET  10-22-11   1'12  . ELBOW SURGERY Right 1990s?   "tendon repaired"  . JOINT REPLACEMENT     both knees & R hip  . KNEE ARTHROSCOPY     "? side"  . LUMBAR SPINE SURGERY  2003   Lumbar fusion -retained hardware([name])  . PARTIAL NEPHRECTOMY Right 10/2011  . TONSILLECTOMY    . TOTAL HIP ARTHROPLASTY Right 03/26/2017   Procedure: RIGHT TOTAL HIP ARTHROPLASTY ANTERIOR APPROACH;  Surgeon: Renette Butters, MD;  Location: [facility];  Service: Orthopedics;  Laterality: Right;  . TOTAL KNEE ARTHROPLASTY     bilateral  Family History  Problem Relation Age of Onset  . Cancer Father        pancreatic  . Heart attack Brother     Social History   Tobacco Use  . Smoking status: Never Smoker  . Smokeless tobacco: Never Used  Vaping Use  . Vaping Use: Never used  Substance Use Topics  . Alcohol use: No  . Drug use: No    Home Medications Prior to Admission medications   Medication Sig Start Date End Date Taking? Authorizing Provider  acetaminophen (TYLENOL) 500 MG tablet Take 500 mg by mouth every 6 (six) hours as needed for moderate pain.   Yes [provider]  ALPRAZolam (XANAX) 0.25 MG tablet Take 0.25 mg by mouth  at bedtime.    Yes [provider]  amLODipine (NORVASC) 5 MG tablet TAKE 1 TABLET BY MOUTH EVERY DAY Patient taking differently: Take 5 mg by mouth daily.  08/21/19  Yes Martinique, Peter M, MD  losartan (COZAAR) 100 MG tablet TAKE 1 TABLET BY MOUTH EVERY DAY Patient taking differently: Take 100 mg by mouth daily.  08/04/19  Yes Martinique, Peter M, MD  Melatonin 5 MG CAPS Take 5 mg by mouth at bedtime.    Yes [provider]  omeprazole (PRILOSEC) 20 MG capsule Take 20 mg by mouth every morning.    Yes [provider]  potassium citrate (UROCIT-K) 10 MEQ (1080 MG) SR tablet Take 20 mEq by mouth 2 (two) times daily.   Yes [provider]  pravastatin (PRAVACHOL) 40 MG tablet Take 40 mg by mouth at bedtime.  02/18/17  Yes [provider]  tamsulosin (FLOMAX) 0.4 MG CAPS capsule Take 0.4 mg by mouth at bedtime.  01/03/17  Yes [provider]  aspirin EC 81 MG tablet Take 81 mg by mouth daily. Patient not taking: Reported on 10/23/2019    [provider]  cyclobenzaprine (FLEXERIL) 10 MG tablet Take 1 tablet (10 mg total) by mouth 3 (three) times daily as needed for muscle spasms. Patient not taking: Reported on 10/23/2019 01/06/19   Newman Pies, MD  docusate sodium (COLACE) 100 MG capsule Take 1 capsule (100 mg total) by mouth 2 (two) times daily. Patient not taking: Reported on 10/23/2019 01/06/19   Newman Pies, MD  oxyCODONE 10 MG TABS Take 1 tablet (10 mg total) by mouth every 4 (four) hours as needed for severe pain ((score 7 to 10)). Patient not taking: Reported on 10/23/2019 01/06/19   Newman Pies, MD    Allergies    Tramadol  Review of Systems   Review of Systems  Constitutional: Negative for appetite change, chills and fever.  HENT: Negative for ear pain, rhinorrhea, sneezing and sore throat.   Eyes: Negative for photophobia and visual disturbance.  Respiratory: Negative for cough, chest tightness, shortness of breath and  wheezing.   Cardiovascular: Negative for chest pain and palpitations.  Gastrointestinal: Positive for abdominal pain. Negative for blood in stool, constipation, diarrhea, nausea and vomiting.  Genitourinary: Negative for dysuria, hematuria and urgency.  Musculoskeletal: Negative for myalgias.  Skin: Negative for rash.  Neurological: Negative for dizziness, weakness and light-headedness.    Physical Exam Updated Vital Signs BP 119/88   Pulse 62   Temp 98.6 F (37 C)   Resp 18   SpO2 99%   Physical Exam Vitals and nursing note reviewed.  Constitutional:      General: He is not in acute distress.    Appearance: He is well-developed.  HENT:     Head: Normocephalic and atraumatic.     Nose: Nose normal.  Eyes:     General: No scleral icterus.       Left eye: No discharge.     Conjunctiva/sclera: Conjunctivae normal.  Cardiovascular:     Rate and Rhythm: Normal rate and regular rhythm.     Heart sounds: Normal heart sounds. No murmur heard.  No friction rub. No gallop.   Pulmonary:     Effort: Pulmonary effort is normal. No respiratory distress.     Breath sounds: Normal breath sounds.  Abdominal:     General: Bowel sounds are normal. There is no distension.     Palpations: Abdomen is soft.     Tenderness: There is abdominal tenderness in the left lower quadrant. There is no guarding.  Musculoskeletal:        General: Normal range of motion.     Cervical back: Normal range of motion and neck supple.  Skin:    General: Skin is warm and dry.     Findings: No rash.     Comments: 2+ DP pulses noted bilaterally.  Neurological:     Mental Status: He is alert.     Motor: No abnormal muscle tone.     Coordination: Coordination normal.     ED Results / Procedures / Treatments   Labs (all labs ordered are listed, but only abnormal results are displayed) Labs Reviewed  COMPREHENSIVE METABOLIC PANEL - Abnormal; Notable for the following components:      Result Value    Glucose, Bld 127 (*)    BUN 28 (*)    Creatinine, Ser 1.98 (*)    Total Bilirubin 1.6 (*)    GFR calc non Af Amer 33 (*)    GFR calc Af Amer 38 (*)    All other components within normal limits  URINALYSIS, ROUTINE W REFLEX MICROSCOPIC - Abnormal; Notable for the following components:   Color, Urine AMBER (*)    APPearance HAZY (*)    Bilirubin Urine SMALL (*)    Protein, ur 30 (*)    All other components within normal limits  LIPASE, BLOOD  CBC    EKG None  Radiology CT Renal Stone Study  Result Date: 10/23/2019 CLINICAL DATA:  Flank pain EXAM: CT ABDOMEN AND PELVIS WITHOUT CONTRAST TECHNIQUE: Multidetector CT imaging of the abdomen and pelvis was performed following the standard protocol without IV contrast. COMPARISON:  CT 01/17/2019, 01/25/2015 FINDINGS: Lower chest: Lung bases demonstrate no acute consolidation or effusion. Borderline cardiomegaly. Hepatobiliary: No focal liver abnormality is seen. No gallstones, gallbladder wall thickening, or biliary dilatation. Pancreas: Unremarkable. No pancreatic ductal dilatation or surrounding inflammatory changes. Spleen: Normal in size without focal abnormality. Adrenals/Urinary Tract: Adrenal glands are normal. Kidneys show no hydronephrosis. There are multiple intrarenal stones. Stones on the right measure up to 5 mm at the lower pole and up to 7 mm on the left at the midpole. Multiple low-density lesions consistent with cysts. Slightly hyperdense exophytic lesion off the upper pole of the right kidney, probably representing hemorrhagic or proteinaceous cyst. No hydronephrosis. 7 mm calcification in the region of the distal right ureter, series 3, image number 76. 4 mm stone in the dependent urinary bladder. Stomach/Bowel: Stomach is within normal limits. Appendix not well seen but no right lower quadrant inflammatory process. No evidence of bowel wall thickening, distention, or inflammatory changes. Diverticular disease of the left colon without  acute inflammatory change. Vascular/Lymphatic: Moderate aortic atherosclerosis.  No aneurysm. No suspicious nodes Reproductive: Slightly enlarged prostate Other: Negative for free air or free fluid. Musculoskeletal: Posterior rods and fixating screws from L2 through S 1. No acute or suspicious osseous abnormality IMPRESSION: 1. Negative for hydronephrosis or ureteral stone. There are multiple bilateral intrarenal stones. 7 mm calcification in the region of the distal right ureter, seen on previous exams and likely representing nonobstructing chronic distal ureteral stone. 4 mm stone in the dependent urinary bladder. 2. Bilateral renal cysts. 3. Left colon diverticular disease without acute inflammatory change. Aortic Atherosclerosis (ICD10-I70.0). Electronically Signed   By: Donavan Foil M.D.   On: 10/23/2019 20:17    Procedures Procedures (including critical care time)  Medications Ordered in ED Medications  sodium chloride flush (NS) 0.9 % injection 3 mL (has no administration in time range)  sodium chloride 0.9 % bolus 1,000 mL (1,000 mLs Intravenous New Bag/Given 10/23/19 1839)    ED Course  I have reviewed the triage vital signs and the nursing notes.  Pertinent labs & imaging results that were available during my care of the patient were reviewed by me and considered in my medical decision making (see chart for details).  Clinical Course as of Oct 23 2054  Fri Oct 23, 2019  1706 Five months ago was 1.2.  Baseline appears about 1.5.  Creatinine(!): 1.98 [HK]    Clinical Course User Index [HK] Delia Heady, PA-C   MDM Rules/Calculators/A&P                          74 year old male with past medical history of GERD, kidney stones, hypertension, stable thoracic aortic aneurysm 4.3 cm seen on recent imaging in March 2021 presenting to the ED with chief complaint of abdominal pain.  Intermittent left lower/left flank pain for the past week that worsened today after eating.  States that it  felt similar to kidney stones today.  Has not had kidney stones in several years.  Denies any nausea, vomiting, diarrhea, constipation, dysuria, hematuria, chest pain, shortness of breath.  On exam abdomen is tender in the left lateral/left lower abdomen area.  No rebound or guarding.  No midline tenderness.  No signs of respiratory distress.  Equal intact distal pulses bilateral lower extremities.  Work appear significant for creatinine of 1.98 which is elevated from what appears to be baseline of 1.5.  Urinalysis without signs of infection.  CBC and lipase unremarkable.  Vital signs within normal limits.  CT renal stone study shows nonobstructing stones, diverticulosis but no specific cause of pain.  Patient overall well-appearing here without any need for pain medication.  Suspect that this could be musculoskeletal cause.  We will have him continue Tylenol, follow-up with PCP and return for worsening symptoms. Patient discussed with and seen by the attending, Dr. Johnney Killian.   Patient is hemodynamically stable, in NAD, and able to ambulate in the ED. Evaluation does not show pathology that would require ongoing emergent intervention or inpatient treatment. I explained the diagnosis to the patient. Pain has been managed and has no complaints prior to discharge. Patient is comfortable with above plan and is stable for discharge at this time. All questions were answered prior to disposition. Strict return precautions for returning to the ED were discussed. Encouraged follow up with PCP.   An After Visit Summary was printed and given to the patient.   Portions of this note were generated with [employer]. Dictation errors may occur despite best attempts  at proofreading.  Final Clinical Impression(s) / ED Diagnoses Final diagnoses:  Abdominal wall pain    Rx / DC Orders ED Discharge Orders    None       Delia Heady, PA-C 10/23/19 2056    Charlesetta Shanks, MD 10/23/19 [phone]

## 2019-10-25 ENCOUNTER — Other Ambulatory Visit: Payer: Self-pay | Admitting: Cardiology

## 2019-11-30 DIAGNOSIS — Z1159 Encounter for screening for other viral diseases: Secondary | ICD-10-CM | POA: Diagnosis not present

## 2019-11-30 DIAGNOSIS — M25551 Pain in right hip: Secondary | ICD-10-CM | POA: Diagnosis not present

## 2019-12-17 DIAGNOSIS — N401 Enlarged prostate with lower urinary tract symptoms: Secondary | ICD-10-CM | POA: Diagnosis not present

## 2019-12-17 DIAGNOSIS — N5201 Erectile dysfunction due to arterial insufficiency: Secondary | ICD-10-CM | POA: Diagnosis not present

## 2019-12-17 DIAGNOSIS — R3912 Poor urinary stream: Secondary | ICD-10-CM | POA: Diagnosis not present

## 2019-12-17 DIAGNOSIS — R3121 Asymptomatic microscopic hematuria: Secondary | ICD-10-CM | POA: Diagnosis not present

## 2019-12-22 DIAGNOSIS — M545 Low back pain, unspecified: Secondary | ICD-10-CM | POA: Diagnosis not present

## 2019-12-22 DIAGNOSIS — M48062 Spinal stenosis, lumbar region with neurogenic claudication: Secondary | ICD-10-CM | POA: Diagnosis not present

## 2019-12-23 DIAGNOSIS — R3121 Asymptomatic microscopic hematuria: Secondary | ICD-10-CM | POA: Diagnosis not present

## 2020-01-01 DIAGNOSIS — S335XXD Sprain of ligaments of lumbar spine, subsequent encounter: Secondary | ICD-10-CM | POA: Diagnosis not present

## 2020-01-04 DIAGNOSIS — S335XXD Sprain of ligaments of lumbar spine, subsequent encounter: Secondary | ICD-10-CM | POA: Diagnosis not present

## 2020-01-04 DIAGNOSIS — R3121 Asymptomatic microscopic hematuria: Secondary | ICD-10-CM | POA: Diagnosis not present

## 2020-01-07 DIAGNOSIS — N281 Cyst of kidney, acquired: Secondary | ICD-10-CM | POA: Diagnosis not present

## 2020-01-07 DIAGNOSIS — N200 Calculus of kidney: Secondary | ICD-10-CM | POA: Diagnosis not present

## 2020-01-07 DIAGNOSIS — R3121 Asymptomatic microscopic hematuria: Secondary | ICD-10-CM | POA: Diagnosis not present

## 2020-01-07 DIAGNOSIS — N201 Calculus of ureter: Secondary | ICD-10-CM | POA: Diagnosis not present

## 2020-01-07 DIAGNOSIS — N210 Calculus in bladder: Secondary | ICD-10-CM | POA: Diagnosis not present

## 2020-01-13 DIAGNOSIS — S335XXD Sprain of ligaments of lumbar spine, subsequent encounter: Secondary | ICD-10-CM | POA: Diagnosis not present

## 2020-01-15 DIAGNOSIS — N201 Calculus of ureter: Secondary | ICD-10-CM | POA: Diagnosis not present

## 2020-01-20 ENCOUNTER — Other Ambulatory Visit: Payer: Self-pay | Admitting: Urology

## 2020-01-20 DIAGNOSIS — S335XXD Sprain of ligaments of lumbar spine, subsequent encounter: Secondary | ICD-10-CM | POA: Diagnosis not present

## 2020-02-05 NOTE — Patient Instructions (Addendum)
DUE TO COVID-19 ONLY ONE VISITOR IS ALLOWED TO COME WITH YOU AND STAY IN THE WAITING ROOM ONLY DURING PRE OP AND PROCEDURE DAY OF SURGERY. THE 1 VISITOR  MAY VISIT WITH YOU AFTER SURGERY IN YOUR PRIVATE ROOM DURING VISITING HOURS ONLY!  YOU NEED TO HAVE A COVID 19 TEST ON: 02/15/20 @ 2:00 PM  , THIS TEST MUST BE DONE BEFORE SURGERY,  COVID TESTING SITE [place] [id number], IT IS ON THE RIGHT GOING OUT [address] APPROXIMATELY  2 MINUTES PAST [employer] ON THE RIGHT. ONCE YOUR COVID TEST IS COMPLETED,  PLEASE BEGIN THE QUARANTINE INSTRUCTIONS AS OUTLINED IN YOUR HANDOUT.                Corey [name]   Your procedure is scheduled on: 02/18/20   Report to [facility] Main  Entrance   Report to admitting at: 1:15 PM     Call this number if you have problems the morning of surgery [phone]    Remember: Do not eat solid food :After Midnight. Clear liquids until: 12:15 PM.  CLEAR LIQUID DIET   Foods Allowed                                                                     Foods Excluded  Coffee and tea, regular and decaf                             liquids that you cannot  Plain Jell-O any favor except red or purple                                           see through such as: Fruit ices (not with fruit pulp)                                     milk, soups, orange juice  Iced Popsicles                                    All solid food Carbonated beverages, regular and diet                                    Cranberry, grape and apple juices Sports drinks like Gatorade Lightly seasoned clear broth or consume(fat free) Sugar, honey syrup  Sample Menu Breakfast                                Lunch                                     Supper Cranberry juice  Beef broth                            Chicken broth Jell-O                                     Grape juice                           Apple juice Coffee or tea                         Jell-O                                      Popsicle                                                Coffee or tea                        Coffee or tea  _____________________________________________________________________  BRUSH YOUR TEETH MORNING OF SURGERY AND RINSE YOUR MOUTH OUT, NO CHEWING GUM CANDY OR MINTS.     Take these medicines the morning of surgery with A SIP OF WATER: amlodipine.omeprazole.                               You may not have any metal on your body including hair pins and              piercings  Do not wear jewelry, lotions, powders or perfumes, deodorant             Men may shave face and neck.   Do not bring valuables to the hospital. [place].  Contacts, dentures or bridgework may not be worn into surgery.  Leave suitcase in the car. After surgery it may be brought to your room.     Patients discharged the day of surgery will not be allowed to drive home. IF YOU ARE HAVING SURGERY AND GOING HOME THE SAME DAY, YOU MUST HAVE AN ADULT TO DRIVE YOU HOME AND BE WITH YOU FOR 24 HOURS. YOU MAY GO HOME BY TAXI OR UBER OR ORTHERWISE, BUT AN ADULT MUST ACCOMPANY YOU HOME AND STAY WITH YOU FOR 24 HOURS.  Name and phone number of your driver:  Special Instructions: N/A              Please read over the following fact sheets you were given: _____________________________________________________________________          [facility] - Preparing for Surgery Before surgery, you can play an important role.  Because skin is not sterile, your skin needs to be as free of germs as possible.  You can reduce the number of germs on your skin by washing with CHG (chlorahexidine gluconate) soap before surgery.  CHG is an antiseptic cleaner which kills germs and bonds with the skin to continue killing germs  even after washing. Please DO NOT use if you have an allergy to CHG or antibacterial soaps.  If your skin becomes  reddened/irritated stop using the CHG and inform your nurse when you arrive at Short Stay. Do not shave (including legs and underarms) for at least 48 hours prior to the first CHG shower.  You may shave your face/neck. Please follow these instructions carefully:  1.  Shower with CHG Soap the night before surgery and the  morning of Surgery.  2.  If you choose to wash your hair, wash your hair first as usual with your  normal  shampoo.  3.  After you shampoo, rinse your hair and body thoroughly to remove the  shampoo.                           4.  Use CHG as you would any other liquid soap.  You can apply chg directly  to the skin and wash                       Gently with a scrungie or clean washcloth.  5.  Apply the CHG Soap to your body ONLY FROM THE NECK DOWN.   Do not use on face/ open                           Wound or open sores. Avoid contact with eyes, ears mouth and genitals (private parts).                       Wash face,  Genitals (private parts) with your normal soap.             6.  Wash thoroughly, paying special attention to the area where your surgery  will be performed.  7.  Thoroughly rinse your body with warm water from the neck down.  8.  DO NOT shower/wash with your normal soap after using and rinsing off  the CHG Soap.                9.  Pat yourself dry with a clean towel.            10.  Wear clean pajamas.            11.  Place clean sheets on your bed the night of your first shower and do not  sleep with pets. Day of Surgery : Do not apply any lotions/deodorants the morning of surgery.  Please wear clean clothes to the hospital/surgery center.  FAILURE TO FOLLOW THESE INSTRUCTIONS MAY RESULT IN THE CANCELLATION OF YOUR SURGERY PATIENT SIGNATURE_________________________________  NURSE SIGNATURE__________________________________  ________________________________________________________________________

## 2020-02-08 ENCOUNTER — Encounter (HOSPITAL_COMMUNITY)
Admission: RE | Admit: 2020-02-08 | Discharge: 2020-02-08 | Disposition: A | Discharge status: Home / Self Care | Payer: Medicare HMO | Source: Ambulatory Visit | Attending: Urology | Admitting: Urology

## 2020-02-08 ENCOUNTER — Other Ambulatory Visit: Payer: Self-pay

## 2020-02-08 ENCOUNTER — Encounter (HOSPITAL_COMMUNITY): Payer: Self-pay

## 2020-02-08 DIAGNOSIS — Z01812 Encounter for preprocedural laboratory examination: Secondary | ICD-10-CM | POA: Insufficient documentation

## 2020-02-08 LAB — BASIC METABOLIC PANEL
Anion gap: 8 (ref 5–15)
BUN: 36 mg/dL — ABNORMAL HIGH (ref 8–23)
CO2: 24 mmol/L (ref 22–32)
Calcium: 9.4 mg/dL (ref 8.9–10.3)
Chloride: 109 mmol/L (ref 98–111)
Creatinine, Ser: 2.31 mg/dL — ABNORMAL HIGH (ref 0.61–1.24)
GFR, Estimated: 29 mL/min — ABNORMAL LOW (ref >60)
Glucose, Bld: 122 mg/dL — ABNORMAL HIGH (ref 70–99)
Potassium: 5.1 mmol/L (ref 3.5–5.1)
Sodium: 141 mmol/L (ref 135–145)

## 2020-02-08 LAB — CBC
HCT: 43.9 % (ref 39.0–52.0)
Hemoglobin: 15.2 g/dL (ref 13.0–17.0)
MCH: 32.5 pg (ref 26.0–34.0)
MCHC: 34.6 g/dL (ref 30.0–36.0)
MCV: 93.8 fL (ref 80.0–100.0)
Platelets: 181 10*3/uL (ref 150–400)
RBC: 4.68 MIL/uL (ref 4.22–5.81)
RDW: 12.3 % (ref 11.5–15.5)
WBC: 5 10*3/uL (ref 4.0–10.5)
nRBC: 0 % (ref 0.0–0.2)

## 2020-02-08 NOTE — Progress Notes (Signed)
COVID Vaccine Completed: Yes Date COVID Vaccine completed: 01/2020. [place] COVID vaccine manufacturer:     Moderna     PCP -Dr. Leanna Battles. LOV: 02/17/19  Cardiologist - Dr. Peter Martinique. LOV: 05/12/19  Chest x-ray -  EKG - 05/12/19. EPIC Stress Test -  ECHO -  Cardiac Cath -  Pacemaker/ICD device last checked:  Sleep Study -  CPAP -   Fasting Blood Sugar -  Checks Blood Sugar _____ times a day  Blood Thinner Instructions: Aspirin Instructions: Will be hold two days before surgery as per Dr. Alinda Money instructions. Last Dose:  Anesthesia review: Hx: HTN,CAD,Lt.BBB.  Patient denies shortness of breath, fever, cough and chest pain at PAT appointment   Patient verbalized understanding of instructions that were given to them at the PAT appointment. Patient was also instructed that they will need to review over the PAT instructions again at home before surgery.

## 2020-02-09 NOTE — Progress Notes (Signed)
Lab. Results: Creatinine: 2.31.

## 2020-02-15 ENCOUNTER — Other Ambulatory Visit (HOSPITAL_COMMUNITY)
Admission: RE | Admit: 2020-02-15 | Discharge: 2020-02-15 | Disposition: A | Discharge status: Home / Self Care | Payer: Medicare HMO | Source: Ambulatory Visit | Attending: Urology | Admitting: Urology

## 2020-02-15 DIAGNOSIS — Z20822 Contact with and (suspected) exposure to covid-19: Secondary | ICD-10-CM | POA: Insufficient documentation

## 2020-02-15 DIAGNOSIS — Z01812 Encounter for preprocedural laboratory examination: Secondary | ICD-10-CM | POA: Insufficient documentation

## 2020-02-15 LAB — SARS CORONAVIRUS 2 (TAT 6-24 HRS): SARS Coronavirus 2: NEGATIVE

## 2020-02-17 NOTE — H&P (Signed)
1. BPH/LUTS  2. Urolithiasis  3. History of renal cell carcinoma  4. Erectile dysfunction   He returns today primarily for follow-up of his microscopic hematuria which was persistent when re-evaluated. He continues to be completely asymptomatic and denies any flank pain or gross hematuria. He underwent CT imaging prior to his appointment today and is scheduled tentatively for cystoscopy.     ALLERGIES: No Allergies    MEDICATIONS: Potassium Citrate Er 10 meq (1,080 mg) tablet, extended release TAKE 2 TABLETS BY MOUTH TWICE A DAY  Potassium Citrate Er 10 meq (1,080 mg) tablet, extended release TAKE 2 TABLETS BY MOUTH TWICE A DAY  Tamsulosin Hcl 0.4 mg capsule TAKE ONE CAPSULE DAILY AT BEDTIME.  Tamsulosin Hcl 0.4 mg capsule TAKE ONE CAPSULE DAILY AT BEDTIME.  Amlodipine Besilate  Aspirin 81 MG TABS Oral  Diclofenac Sodium 25 mg tablet, delayed release 0 Oral  Losartan Potassium 100 mg tablet Oral  Pravastatin Sodium 40 mg tablet Oral  PriLOSEC CPDR Oral     GU PSH: Cysto Bladder Stone <2.5cm - 2014 Cysto Uretero Lithotripsy - 2011 Cystoscopy Insert Stent - 2011 Cystoscopy Ureteroscopy - 2014 ESWL - 2014, 2009, 2009, 2007 Locm 300-399Mg/Ml Iodine,1Ml - 01/07/2020 Partial nephrectomy (laparoscopic) - 2013       PSH Notes: Cystoscopy With Fragmentation Of Bladder Calculus, Cystoscopy With Ureteroscopy Left, Lithotripsy, Appendectomy, Kidney Surgery Laparoscopic Partial Nephrectomy, Cystoscopy With Ureteroscopy With Lithotripsy, Cystoscopy With Insertion Of Ureteral Stent Left, Lithotripsy, Lithotripsy, Knee Arthroscopy With Debridement Of Articular Cartilage, Lithotripsy, Elbow Surgery, Back Surgery     lipoma removed from left wrist 2019  Skin cancer removed from nose 2019  Back surgery - Spinal fusion 01/2019   NON-GU PSH: Appendectomy - 2013 Hip Replacement, Right Knee Arthroscopy - 2007     GU PMH: Microscopic hematuria - 01/07/2020, - 12/17/2019 BPH w/LUTS -  12/17/2019, Benign prostatic hyperplasia (BPH) with straining on urination, - 2016 ED due to arterial insufficiency - 12/17/2019 History of urolithiasis - 12/17/2019, Nephrolithiasis, - 2014 Weak Urinary Stream - 12/17/2019, - 2017 Elevated PSA - 04/07/2019 Post-void dribbling - 04/07/2019 History of kidney cancer - 11/21/2018 Hematospermia - 09/25/2018 Abdominal Pain Unspec - 2018 Renal calculus, Nephrolithiasis - 2016 Renal cell carcinoma, right, Renal cell carcinoma of right kidney - 2016 Ureteral calculus, Calculus of ureter - 2016 Bladder Stone, Bladder calculus - 2014      PMH Notes:   1) Renal cell carcinoma: He is s/p a right robotic partial nephrectomy for two lesions on 11/01/11. 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 Joelyn Oms, Dr. Serita Butcher, Dr. Karsten Ro, and Dr. Rosana Hoes. His first stone episode was at age 60 in 42 when he underwent cystoscopy with basket extraction. He has since undergone numerous procedures including ureteroscopic stone removal in 1990 and 2011 and ESWL in 2001, 2005 x 2, 2007, and 2009 x 2.   He has been noted to have low urine volumes and hypercalciuria and hypocitraturia.   Current medical management: Potassium  citrate 20 mEq po bid  Prior medical therapy: HCTZ (could not tolerate)   Dec 2013: ESWL of left UPJ calculus   3) BPH/LUTS: While taking tamsulosin for medical expulsion therapy for his ureteral stone, he found this to be helpful for improving his  voiding function and continued taking it.   Current treatment: Tamsulosin 0.4 mg   4) Hematuria: He was noted to have microscopic hematuria in September 2021.   Oct 2021: Cysto - , CT imaging   5) CKD: He has been noted to have an elevated creatinine in the past thought to be related to chronic NSAID use with diclofenac.   Baseline renal function: Cr 1.1 (May 2013)   ** Medical comorbidities: He has a history of chest pain and left bundle branch block. He underwent a cardiac evaluation by Dr. Peter Martinique in 2011 which included a cardiac catheterization and apparently did not require intervention.     NON-GU PMH: Hypercalciuria, Hypercalciuria - 2014 Personal history of other diseases of the circulatory system, History of hypertension - 2014 Personal history of other diseases of the digestive system, History of esophageal reflux - 2014    FAMILY HISTORY: Adenocarcinoma Of The Pancreas - Father Death In The Family Father - Runs In Family Death In The Family Mother - Runs In Family Urologic Disorder - Runs In Family   SOCIAL HISTORY: Marital Status: Married Preferred Language: English; Ethnicity: Not Hispanic Or Latino; Race: White Current Smoking Status: Patient has never smoked.  Does not drink anymore.  Drinks 1 caffeinated drink per day.     Notes: Never A Smoker, Occupation:, Marital History - Currently Married, Alcohol Use   REVIEW OF SYSTEMS:    GU Review Male:   Patient denies frequent urination, hard to postpone urination, burning/ pain with urination, get up at night to urinate, leakage of urine, stream starts and stops, trouble starting your streams, and have to strain to urinate .  Gastrointestinal (Lower):   Patient denies diarrhea and constipation.  Gastrointestinal (Upper):   Patient denies nausea and vomiting.  Constitutional:   Patient denies fever, night sweats, weight loss, and fatigue.  Skin:   Patient denies skin rash/ lesion and itching.  Eyes:   Patient  denies blurred vision and double vision.  Ears/ Nose/ Throat:   Patient denies sore throat and sinus problems.  Hematologic/Lymphatic:   Patient denies swollen glands and easy bruising.  Cardiovascular:   Patient denies leg swelling and chest pains.  Respiratory:   Patient denies cough and shortness of breath.  Endocrine:   Patient denies excessive thirst.  Musculoskeletal:   Patient denies back pain and joint pain.  Neurological:   Patient denies headaches and dizziness.  Psychologic:   Patient denies depression and anxiety.   VITAL SIGNS:      01/15/2020 01:51 PM  Weight 190 lb / 86.18 kg  Height 71.5 in / 181.61 cm  BMI 26.1 kg/m   MULTI-SYSTEM PHYSICAL EXAMINATION:    Constitutional: Well-nourished. No physical deformities. Normally developed. Good grooming.  Respiratory: No labored breathing, no use of accessory muscles. Clear bilaterally.  Cardiovascular: Normal temperature, normal extremity pulses, no swelling, no varicosities. Regular rate and rhythm.  Gastrointestinal: No mass, no tenderness, no rigidity, non obese abdomen. No CVA tenderness     Complexity of Data:  Records Review:   Previous Patient Records  X-Ray Review: KUB: Reviewed Films.  C.T. Abdomen/Pelvis: Reviewed Films.     04/08/19 12/13/09 04/17/07  PSA  Total PSA 3.40 ng/mL 1.31  0.95   Free PSA 0.73 ng/mL    % Free PSA 21 % PSA     Notes:                     CT ABDOMEN AND PELVIS WITHOUT AND WITH CONTRAST 01/07/2020   --------------------------------------------------------------------------------   Carren Rang  MRN: [id number] Ordering Provider: Raynelle Bring, Brooke Bonito  DOB: 11/08/1945 Date Collected: 01/07/2020 13:45:00  SSN: [id number] Date Completed: 01/08/2020 10:44:50   --------------------------------------------------------------------------------   CLINICAL DATA: Microhematuria.   EXAM:  CT ABDOMEN AND PELVIS WITHOUT AND WITH CONTRAST   TECHNIQUE:  Multidetector CT imaging of the abdomen and  pelvis was performed  following the standard protocol before and following the bolus  administration of intravenous contrast.   CONTRAST: 80 cc Omnipaque 300   COMPARISON: CT scan 10/23/2019   FINDINGS:  Lower chest: The lung bases are clear of acute process. No pleural  effusion or pulmonary lesions. The heart is normal in size. No  pericardial effusion. The distal esophagus and aorta are  unremarkable.   Hepatobiliary: No hepatic lesions or intrahepatic biliary  dilatation. The gallbladder is unremarkable. No common bile duct  dilatation.   Pancreas: No mass, inflammation or ductal dilatation.   Spleen: Normal size. No focal lesions.   Adrenals/Urinary Tract: The adrenal glands are unremarkable.   Bilateral renal calculi are again demonstrated. There are also  bilateral renal cysts. Some of these are hemorrhagic. I do not see  any worrisome enhancing renal lesions.   There are 2 distal right ureteral calculi. The more proximal  calculus measures 3 mm at the level of the upper acetabulum. 5 mm  calculus is noted along the mid acetabular level. No associated  hydroureteronephrosis.   4 mm calculus is noted in the bladder near the right UVJ. This  calculus moves dependently with the patient prone. No bladder  lesions or asymmetric bladder wall thickening. The left ureter is  normal.   Stomach/Bowel: The stomach, duodenum, small bowel and colon are  unremarkable. No acute inflammatory changes, mass lesions or  obstructive findings.   Vascular/Lymphatic: Stable aortic calcifications. No aneurysm or  dissection. The branch vessels are patent. The major venous  structures are patent. No mesenteric or retroperitoneal mass or  adenopathy.   Reproductive: Mild prostate gland enlargement with median lobe  hypertrophy impressing on the base of the bladder. The seminal  vesicles are unremarkable.   Other: No pelvic mass or adenopathy. No free pelvic fluid  collections. No  inguinal mass or adenopathy. No abdominal wall  hernia or subcutaneous lesions.   Musculoskeletal: No significant bony findings. Moderate artifact  related to a right total hip arthroplasty. Surgical changes from  prior lumbar fusion.   IMPRESSION:  1. Bilateral renal calculi and bilateral renal cysts, some of which  are hemorrhagic. No worrisome renal lesions.  2. 2 distal right ureteral calculi but no associated  hydroureteronephrosis.  3. 4 mm bladder calculus.  4. No worrisome bladder lesions.  5. Stable atherosclerotic calcifications involving the aorta and  branch vessels.   Aortic Atherosclerosis (ICD10-I70.0).    Electronically Signed  By: Marijo Sanes M.D.  On: 01/08/2020 10:42   I independently reviewed his KUB x-ray. This does demonstrate 2 calcifications in the vicinity of the distal ureter consistent with persistent ureteral stones.         ASSESSMENT:      ICD-10 Details  1 GU:   Ureteral calculus - N20.1    PLAN:  1. Microscopic hematuria/right ureteral calculi: He does have distal right ureteral calculi as a source for his hematuria. He will proceed with medical expulsion therapy. He is on tamsulosin taking 0.8 mg for his voiding symptoms. He will continue this in strain his urine. He has also been provided a prescription for pain medication to take as needed. He will be tentatively scheduled for a couple of weeks for cystoscopy with right retrograde pyelography, right ureteroscopy of laser lithotripsy, and right ureteral stent placement. We have reviewed the potential risks, complications, and expected recovery process associated with this procedure. In the meantime, he will notify me should he develop fever, uncontrolled pain, or persistent nausea/vomiting.   2. BPH/LUTS: He is improved on tamsulosin 0.8 mg and will continue this therapy.   3. Erectile dysfunction: Continue sildenafil prn   4. Urolithiasis: He will continue potassium citrate 20 mEq  p.o. b.i.d.. We will likely consider repeating a 24 hour urine following treatment of his acute stone situation.

## 2020-02-18 ENCOUNTER — Encounter (HOSPITAL_COMMUNITY): Payer: Self-pay | Admitting: Urology

## 2020-02-18 ENCOUNTER — Ambulatory Visit (HOSPITAL_COMMUNITY)
Admission: RE | Admit: 2020-02-18 | Discharge: 2020-02-18 | Disposition: A | Discharge status: Home / Self Care | Payer: Medicare HMO | Attending: Urology | Admitting: Urology

## 2020-02-18 ENCOUNTER — Ambulatory Visit (HOSPITAL_COMMUNITY): Payer: Medicare HMO

## 2020-02-18 ENCOUNTER — Encounter (HOSPITAL_COMMUNITY)
Admission: RE | Disposition: A | Discharge status: Home / Self Care | Payer: Self-pay | Source: Home / Self Care | Attending: Urology

## 2020-02-18 ENCOUNTER — Ambulatory Visit (HOSPITAL_COMMUNITY): Payer: Medicare HMO | Admitting: Anesthesiology

## 2020-02-18 ENCOUNTER — Ambulatory Visit (HOSPITAL_COMMUNITY): Payer: Medicare HMO | Admitting: Physician Assistant

## 2020-02-18 DIAGNOSIS — Z96641 Presence of right artificial hip joint: Secondary | ICD-10-CM | POA: Diagnosis not present

## 2020-02-18 DIAGNOSIS — N189 Chronic kidney disease, unspecified: Secondary | ICD-10-CM | POA: Diagnosis not present

## 2020-02-18 DIAGNOSIS — N201 Calculus of ureter: Secondary | ICD-10-CM | POA: Diagnosis not present

## 2020-02-18 DIAGNOSIS — N529 Male erectile dysfunction, unspecified: Secondary | ICD-10-CM | POA: Insufficient documentation

## 2020-02-18 DIAGNOSIS — Z7982 Long term (current) use of aspirin: Secondary | ICD-10-CM | POA: Diagnosis not present

## 2020-02-18 DIAGNOSIS — I129 Hypertensive chronic kidney disease with stage 1 through stage 4 chronic kidney disease, or unspecified chronic kidney disease: Secondary | ICD-10-CM | POA: Insufficient documentation

## 2020-02-18 DIAGNOSIS — I447 Left bundle-branch block, unspecified: Secondary | ICD-10-CM | POA: Diagnosis not present

## 2020-02-18 DIAGNOSIS — I712 Thoracic aortic aneurysm, without rupture: Secondary | ICD-10-CM | POA: Diagnosis not present

## 2020-02-18 DIAGNOSIS — Z79899 Other long term (current) drug therapy: Secondary | ICD-10-CM | POA: Insufficient documentation

## 2020-02-18 DIAGNOSIS — Z85828 Personal history of other malignant neoplasm of skin: Secondary | ICD-10-CM | POA: Insufficient documentation

## 2020-02-18 DIAGNOSIS — Z85528 Personal history of other malignant neoplasm of kidney: Secondary | ICD-10-CM | POA: Diagnosis not present

## 2020-02-18 DIAGNOSIS — N210 Calculus in bladder: Secondary | ICD-10-CM | POA: Insufficient documentation

## 2020-02-18 DIAGNOSIS — Z87442 Personal history of urinary calculi: Secondary | ICD-10-CM | POA: Insufficient documentation

## 2020-02-18 DIAGNOSIS — N401 Enlarged prostate with lower urinary tract symptoms: Secondary | ICD-10-CM | POA: Diagnosis not present

## 2020-02-18 DIAGNOSIS — Z905 Acquired absence of kidney: Secondary | ICD-10-CM | POA: Insufficient documentation

## 2020-02-18 HISTORY — PX: CYSTOSCOPY/URETEROSCOPY/HOLMIUM LASER/STENT PLACEMENT: SHX6546

## 2020-02-18 SURGERY — CYSTOSCOPY/URETEROSCOPY/HOLMIUM LASER/STENT PLACEMENT
Anesthesia: General | Laterality: Right

## 2020-02-18 MED ORDER — ONDANSETRON HCL 4 MG/2ML IJ SOLN
INTRAMUSCULAR | Status: AC
  Filled 2020-02-18: qty 2

## 2020-02-18 MED ORDER — PROPOFOL 10 MG/ML IV BOLUS
INTRAVENOUS | Status: DC | PRN
  Administered 2020-02-18: 130 mg via INTRAVENOUS

## 2020-02-18 MED ORDER — ROCURONIUM BROMIDE 10 MG/ML (PF) SYRINGE
PREFILLED_SYRINGE | INTRAVENOUS | Status: DC | PRN
  Administered 2020-02-18: 60 mg via INTRAVENOUS

## 2020-02-18 MED ORDER — SUGAMMADEX SODIUM 200 MG/2ML IV SOLN
INTRAVENOUS | Status: DC | PRN
  Administered 2020-02-18: 400 mg via INTRAVENOUS

## 2020-02-18 MED ORDER — ROCURONIUM BROMIDE 10 MG/ML (PF) SYRINGE
PREFILLED_SYRINGE | INTRAVENOUS | Status: AC
  Filled 2020-02-18: qty 10

## 2020-02-18 MED ORDER — FENTANYL CITRATE (PF) 100 MCG/2ML IJ SOLN
25.0000 ug | INTRAMUSCULAR | Status: DC | PRN
  Administered 2020-02-18: 25 ug via INTRAVENOUS

## 2020-02-18 MED ORDER — SODIUM CHLORIDE 0.9 % IR SOLN
Status: DC | PRN
  Administered 2020-02-18: 3000 mL

## 2020-02-18 MED ORDER — LACTATED RINGERS IV SOLN: INTRAVENOUS | Status: DC

## 2020-02-18 MED ORDER — ONDANSETRON HCL 4 MG/2ML IJ SOLN: 4.0000 mg | Freq: Once | INTRAMUSCULAR | Status: DC | PRN

## 2020-02-18 MED ORDER — CHLORHEXIDINE GLUCONATE 0.12 % MT SOLN
15.0000 mL | Freq: Once | OROMUCOSAL | Status: AC
  Administered 2020-02-18: 15 mL via OROMUCOSAL

## 2020-02-18 MED ORDER — IOHEXOL 300 MG/ML  SOLN
INTRAMUSCULAR | Status: DC | PRN
  Administered 2020-02-18: 2 mL

## 2020-02-18 MED ORDER — KETOROLAC TROMETHAMINE 30 MG/ML IJ SOLN: 15.0000 mg | Freq: Once | INTRAMUSCULAR | Status: AC | PRN

## 2020-02-18 MED ORDER — ONDANSETRON HCL 4 MG/2ML IJ SOLN
INTRAMUSCULAR | Status: DC | PRN
  Administered 2020-02-18: 4 mg via INTRAVENOUS

## 2020-02-18 MED ORDER — OXYCODONE-ACETAMINOPHEN 5-325 MG PO TABS
1.0000 | ORAL_TABLET | ORAL | 0 refills | Status: DC | PRN
Start: 2020-02-18 — End: 2020-02-18

## 2020-02-18 MED ORDER — KETOROLAC TROMETHAMINE 30 MG/ML IJ SOLN
INTRAMUSCULAR | Status: AC
  Administered 2020-02-18: 15 mg via INTRAVENOUS
  Filled 2020-02-18: qty 1

## 2020-02-18 MED ORDER — ORAL CARE MOUTH RINSE: 15.0000 mL | Freq: Once | OROMUCOSAL | Status: AC

## 2020-02-18 MED ORDER — FENTANYL CITRATE (PF) 100 MCG/2ML IJ SOLN
INTRAMUSCULAR | Status: AC
  Filled 2020-02-18: qty 2

## 2020-02-18 MED ORDER — LIDOCAINE 2% (20 MG/ML) 5 ML SYRINGE
INTRAMUSCULAR | Status: DC | PRN
  Administered 2020-02-18: 100 mg via INTRAVENOUS

## 2020-02-18 MED ORDER — DEXAMETHASONE SODIUM PHOSPHATE 10 MG/ML IJ SOLN
INTRAMUSCULAR | Status: DC | PRN
  Administered 2020-02-18: 10 mg via INTRAVENOUS

## 2020-02-18 MED ORDER — FENTANYL CITRATE (PF) 100 MCG/2ML IJ SOLN
INTRAMUSCULAR | Status: DC | PRN
  Administered 2020-02-18 (×2): 50 ug via INTRAVENOUS

## 2020-02-18 MED ORDER — PROPOFOL 10 MG/ML IV BOLUS
INTRAVENOUS | Status: AC
  Filled 2020-02-18: qty 20

## 2020-02-18 MED ORDER — LIDOCAINE HCL (PF) 2 % IJ SOLN
INTRAMUSCULAR | Status: AC
  Filled 2020-02-18: qty 5

## 2020-02-18 MED ORDER — CEFAZOLIN SODIUM-DEXTROSE 2-4 GM/100ML-% IV SOLN
2.0000 g | Freq: Once | INTRAVENOUS | Status: AC
  Administered 2020-02-18: 2 g via INTRAVENOUS
  Filled 2020-02-18: qty 100

## 2020-02-18 MED ORDER — OXYCODONE-ACETAMINOPHEN 5-325 MG PO TABS
1.0000 | ORAL_TABLET | Freq: Four times a day (QID) | ORAL | 0 refills | Status: DC | PRN
Start: 2020-02-18 — End: 2020-05-30

## 2020-02-18 MED ORDER — DEXAMETHASONE SODIUM PHOSPHATE 10 MG/ML IJ SOLN
INTRAMUSCULAR | Status: AC
  Filled 2020-02-18: qty 1

## 2020-02-18 SURGICAL SUPPLY — 18 items
BAG URO CATCHER STRL LF (MISCELLANEOUS) ×2 IMPLANT
BASKET ZERO TIP NITINOL 2.4FR (BASKET) ×2 IMPLANT
BSKT STON RTRVL ZERO TP 2.4FR (BASKET) ×1
CATH INTERMIT  6FR 70CM (CATHETERS) ×2 IMPLANT
CLOTH BEACON ORANGE TIMEOUT ST (SAFETY) ×2 IMPLANT
GOWN STRL REUS W/TWL LRG LVL3 (GOWN DISPOSABLE) ×6 IMPLANT
GUIDEWIRE STR DUAL SENSOR (WIRE) ×2 IMPLANT
GUIDEWIRE ZIPWRE .038 STRAIGHT (WIRE) IMPLANT
KIT TURNOVER KIT A (KITS) IMPLANT
LASER FIB FLEXIVA PULSE ID 365 (Laser) ×2 IMPLANT
MANIFOLD NEPTUNE II (INSTRUMENTS) ×2 IMPLANT
PACK CYSTO (CUSTOM PROCEDURE TRAY) ×2 IMPLANT
SHEATH URETERAL 12FRX35CM (MISCELLANEOUS) IMPLANT
STENT URET 6FRX26 CONTOUR (STENTS) ×2 IMPLANT
TRACTIP FLEXIVA PULS ID 200XHI (Laser) IMPLANT
TRACTIP FLEXIVA PULSE ID 200 (Laser)
TUBING CONNECTING 10 (TUBING) ×2 IMPLANT
TUBING UROLOGY SET (TUBING) ×2 IMPLANT

## 2020-02-18 NOTE — Op Note (Signed)
Preoperative diagnosis: Right ureteral and bladder calculi  Postoperative diagnosis: Right ureteral and bladder calculi  Procedure:  1. Cystoscopy 2. Right ureteroscopy and stone removal 3. Ureteroscopic laser lithotripsy 4. Right ureteral stent placement (6 x26 with string) 5. Right retrograde pyelography with interpretation 6. Removal of bladder calculus  Surgeon: Pryor Curia. M.D.  Anesthesia: General  Complications: None  Intraoperative findings: Right retrograde pyelography demonstrated a filling defect within the distal right ureter consistent with the patient's known calculus without other abnormalities.  EBL: Minimal  Specimens: 1. Right ureteral and bladder calculi  Disposition of specimens: [facility] for stone analysis  Indication: Corey [name] is a 74 y.o. year old patient with urolithiasis who was recently noted to have bladder and right ureteral calculi. After reviewing the management options for treatment, the patient elected to proceed with the above surgical procedure(s). We have discussed the potential benefits and risks of the procedure, side effects of the proposed treatment, the likelihood of the patient achieving the goals of the procedure, and any potential problems that might occur during the procedure or recuperation. Informed consent has been obtained.  Description of procedure:  The patient was taken to the operating room and general anesthesia was induced.  The patient was placed in the dorsal lithotomy position, prepped and draped in the usual sterile fashion, and preoperative antibiotics were administered. A preoperative time-out was performed.   Cystourethroscopy was performed.  The patient's urethra was examined and was normal. The bladder was then systematically examined in its entirety. There was no evidence for any bladder tumors or other mucosal pathology.  A single bladder calculus was noted.   Attention then  turned to the right ureteral orifice and a ureteral catheter was used to intubate the ureteral orifice.  Omnipaque contrast was injected through the ureteral catheter and a retrograde pyelogram was performed with findings as dictated above.  A 0.38 sensor guidewire was then advanced up the right ureter into the renal pelvis under fluoroscopic guidance. The 6 Fr semirigid ureteroscope was then advanced into the ureter next to the guidewire and the calculus was identified.   The stone was then fragmented with the 365 micron holmium laser fiber on a setting of 0.6 J and frequency of 6 Hz.   All stones were then removed from the ureter with a zero tip nitinol basket.  Reinspection of the ureter revealed no remaining visible stones or fragments.   The bladder calculus was then removed with a zero tip nitinol basket.  The wire was then backloaded through the cystoscope and a ureteral stent was advance over the wire using Seldinger technique.  The stent was positioned appropriately under fluoroscopic and cystoscopic guidance.  The wire was then removed with an adequate stent curl noted in the renal pelvis as well as in the bladder.  The bladder was then emptied and the procedure ended.  The patient appeared to tolerate the procedure well and without complications.  The patient was able to be awakened and transferred to the recovery unit in satisfactory condition.

## 2020-02-18 NOTE — OR Nursing (Signed)
Stone taken per Dr. Alinda Money

## 2020-02-18 NOTE — Anesthesia Procedure Notes (Signed)
Procedure Name: Intubation Date/Time: 02/18/2020 2:40 PM Performed by: Sharlette Dense, CRNA Pre-anesthesia Checklist: Patient identified Patient Re-evaluated:Patient Re-evaluated prior to induction Oxygen Delivery Method: Circle system utilized Preoxygenation: Pre-oxygenation with 100% oxygen Induction Type: IV induction Ventilation: Mask ventilation without difficulty and Oral airway inserted - appropriate to patient size Laryngoscope Size: Miller and 3 Grade View: Grade I Tube type: Oral Tube size: 7.5 mm Number of attempts: 1 Airway Equipment and Method: Stylet Placement Confirmation: ETT inserted through vocal cords under direct vision,  positive ETCO2 and breath sounds checked- equal and bilateral Secured at: 22 cm Tube secured with: Tape Dental Injury: Teeth and Oropharynx as per pre-operative assessment

## 2020-02-18 NOTE — Anesthesia Postprocedure Evaluation (Signed)
Anesthesia Post Note  Patient: Corey [name]  Procedure(s) Performed: CYSTOSCOPY/RETROGRADE/URETEROSCOPY/HOLMIUM LASER/STENT PLACEMENT (Right )     Patient location during evaluation: PACU Anesthesia Type: General Level of consciousness: awake and alert Pain management: pain level controlled Vital Signs Assessment: post-procedure vital signs reviewed and stable Respiratory status: spontaneous breathing, nonlabored ventilation, respiratory function stable and patient connected to nasal cannula oxygen Cardiovascular status: blood pressure returned to baseline and stable Postop Assessment: no apparent nausea or vomiting Anesthetic complications: no   No complications documented.  Last Vitals:  Vitals:   02/18/20 1630 02/18/20 1639  BP: 134/90 127/81  Pulse: 85 100  Resp: 12 18  Temp: 36.6 C   SpO2: 93% 92%    Last Pain:  Vitals:   02/18/20 1639  TempSrc:   PainSc: 0-No pain                 Belanna Manring S

## 2020-02-18 NOTE — Discharge Instructions (Signed)
1. You may see some blood in the urine and may have some burning with urination for 48-72 hours. You also may notice that you have to urinate more frequently or urgently after your procedure which is normal.  2. You should call should you develop an inability urinate, fever > 101, persistent nausea and vomiting that prevents you from eating or drinking to stay hydrated.  3. If you have a stent, you will likely urinate more frequently and urgently until the stent is removed and you may experience some discomfort/pain in the lower abdomen and flank especially when urinating. You may take pain medication prescribed to you if needed for pain. You may also intermittently have blood in the urine until the stent is removed. You may remove your stent on Monday morning.  Simply pull the string that is taped to your body and the stent will easily come out.  This may be best done in the shower as some urine may come out with the stent.  Usually you will feel relief once the stent is removed, but occasionally patients can develop pain due to residual swelling of the ureter that may temporarily obstruct the kidney.  This can be managed by taking pain medication and it will typically resolve with time.  Please do not hesitate to call if you have pain that is not controlled with your pain medication or does not improved within 24-48 hours.

## 2020-02-18 NOTE — Transfer of Care (Signed)
Immediate Anesthesia Transfer of Care Note  Patient: Corey [name]  Procedure(s) Performed: CYSTOSCOPY/RETROGRADE/URETEROSCOPY/HOLMIUM LASER/STENT PLACEMENT (Right )  Patient Location: PACU  Anesthesia Type:General  Level of Consciousness: awake and alert   Airway & Oxygen Therapy: Patient Spontanous Breathing and Patient connected to face mask oxygen  Post-op Assessment: Report given to RN and Post -op Vital signs reviewed and stable  Post vital signs: Reviewed and stable  Last Vitals:  Vitals Value Taken Time  BP    Temp    Pulse 87 02/18/20 1534  Resp 17 02/18/20 1534  SpO2 98 % 02/18/20 1534  Vitals shown include unvalidated device data.  Last Pain:  Vitals:   02/18/20 1404  TempSrc:   PainSc: 0-No pain         Complications: No complications documented.

## 2020-02-18 NOTE — Anesthesia Preprocedure Evaluation (Signed)
Anesthesia Evaluation  Patient identified by MRN, date of birth, ID band Patient awake    Reviewed: Allergy & Precautions, NPO status , Patient's Chart, lab work & pertinent test results  Airway Mallampati: II  TM Distance: >3 FB Neck ROM: Full    Dental no notable dental hx.    Pulmonary neg pulmonary ROS,    Pulmonary exam normal breath sounds clear to auscultation       Cardiovascular hypertension, Pt. on medications + Peripheral Vascular Disease  Normal cardiovascular exam Rhythm:Regular Rate:Normal  LBBB   Neuro/Psych negative neurological ROS  negative psych ROS   GI/Hepatic negative GI ROS, Neg liver ROS,   Endo/Other  negative endocrine ROS  Renal/GU Renal InsufficiencyRenal disease  negative genitourinary   Musculoskeletal negative musculoskeletal ROS (+)   Abdominal   Peds negative pediatric ROS (+)  Hematology negative hematology ROS (+)   Anesthesia Other Findings   Reproductive/Obstetrics negative OB ROS                             Anesthesia Physical Anesthesia Plan  ASA: III  Anesthesia Plan: General   Post-op Pain Management:    Induction: Intravenous  PONV Risk Score and Plan: 2 and Ondansetron, Dexamethasone and Treatment may vary due to age or medical condition  Airway Management Planned: LMA  Additional Equipment:   Intra-op Plan:   Post-operative Plan: Extubation in OR  Informed Consent: I have reviewed the patients History and Physical, chart, labs and discussed the procedure including the risks, benefits and alternatives for the proposed anesthesia with the patient or authorized representative who has indicated his/her understanding and acceptance.     Dental advisory given  Plan Discussed with: CRNA and Surgeon  Anesthesia Plan Comments:         Anesthesia Quick Evaluation

## 2020-02-19 ENCOUNTER — Encounter (HOSPITAL_COMMUNITY): Payer: Self-pay | Admitting: Urology

## 2020-02-22 DIAGNOSIS — E785 Hyperlipidemia, unspecified: Secondary | ICD-10-CM | POA: Diagnosis not present

## 2020-02-22 DIAGNOSIS — Z125 Encounter for screening for malignant neoplasm of prostate: Secondary | ICD-10-CM | POA: Diagnosis not present

## 2020-02-22 DIAGNOSIS — R739 Hyperglycemia, unspecified: Secondary | ICD-10-CM | POA: Diagnosis not present

## 2020-02-23 ENCOUNTER — Other Ambulatory Visit: Payer: Self-pay | Admitting: Cardiology

## 2020-02-29 DIAGNOSIS — I10 Essential (primary) hypertension: Secondary | ICD-10-CM | POA: Diagnosis not present

## 2020-02-29 DIAGNOSIS — Z1212 Encounter for screening for malignant neoplasm of rectum: Secondary | ICD-10-CM | POA: Diagnosis not present

## 2020-02-29 DIAGNOSIS — Z85528 Personal history of other malignant neoplasm of kidney: Secondary | ICD-10-CM | POA: Diagnosis not present

## 2020-02-29 DIAGNOSIS — N1831 Chronic kidney disease, stage 3a: Secondary | ICD-10-CM | POA: Diagnosis not present

## 2020-02-29 DIAGNOSIS — R972 Elevated prostate specific antigen [PSA]: Secondary | ICD-10-CM | POA: Diagnosis not present

## 2020-02-29 DIAGNOSIS — E785 Hyperlipidemia, unspecified: Secondary | ICD-10-CM | POA: Diagnosis not present

## 2020-02-29 DIAGNOSIS — I129 Hypertensive chronic kidney disease with stage 1 through stage 4 chronic kidney disease, or unspecified chronic kidney disease: Secondary | ICD-10-CM | POA: Diagnosis not present

## 2020-02-29 DIAGNOSIS — R82998 Other abnormal findings in urine: Secondary | ICD-10-CM | POA: Diagnosis not present

## 2020-02-29 DIAGNOSIS — Z1331 Encounter for screening for depression: Secondary | ICD-10-CM | POA: Diagnosis not present

## 2020-02-29 DIAGNOSIS — Z0000 Encounter for general adult medical examination without abnormal findings: Secondary | ICD-10-CM | POA: Diagnosis not present

## 2020-02-29 DIAGNOSIS — G4700 Insomnia, unspecified: Secondary | ICD-10-CM | POA: Diagnosis not present

## 2020-03-22 DIAGNOSIS — N401 Enlarged prostate with lower urinary tract symptoms: Secondary | ICD-10-CM | POA: Diagnosis not present

## 2020-03-22 DIAGNOSIS — N403 Nodular prostate with lower urinary tract symptoms: Secondary | ICD-10-CM | POA: Diagnosis not present

## 2020-03-22 DIAGNOSIS — R972 Elevated prostate specific antigen [PSA]: Secondary | ICD-10-CM | POA: Diagnosis not present

## 2020-03-22 DIAGNOSIS — R3912 Poor urinary stream: Secondary | ICD-10-CM | POA: Diagnosis not present

## 2020-03-22 DIAGNOSIS — N201 Calculus of ureter: Secondary | ICD-10-CM | POA: Diagnosis not present

## 2020-04-07 DIAGNOSIS — D1801 Hemangioma of skin and subcutaneous tissue: Secondary | ICD-10-CM | POA: Diagnosis not present

## 2020-04-07 DIAGNOSIS — D2271 Melanocytic nevi of right lower limb, including hip: Secondary | ICD-10-CM | POA: Diagnosis not present

## 2020-04-07 DIAGNOSIS — Z85820 Personal history of malignant melanoma of skin: Secondary | ICD-10-CM | POA: Diagnosis not present

## 2020-04-07 DIAGNOSIS — D2261 Melanocytic nevi of right upper limb, including shoulder: Secondary | ICD-10-CM | POA: Diagnosis not present

## 2020-04-07 DIAGNOSIS — L814 Other melanin hyperpigmentation: Secondary | ICD-10-CM | POA: Diagnosis not present

## 2020-04-07 DIAGNOSIS — D225 Melanocytic nevi of trunk: Secondary | ICD-10-CM | POA: Diagnosis not present

## 2020-04-07 DIAGNOSIS — L821 Other seborrheic keratosis: Secondary | ICD-10-CM | POA: Diagnosis not present

## 2020-04-09 DIAGNOSIS — N200 Calculus of kidney: Secondary | ICD-10-CM | POA: Diagnosis not present

## 2020-04-11 ENCOUNTER — Other Ambulatory Visit: Payer: Self-pay | Admitting: Urology

## 2020-04-11 DIAGNOSIS — N403 Nodular prostate with lower urinary tract symptoms: Secondary | ICD-10-CM

## 2020-04-11 DIAGNOSIS — R972 Elevated prostate specific antigen [PSA]: Secondary | ICD-10-CM

## 2020-04-12 ENCOUNTER — Other Ambulatory Visit (HOSPITAL_COMMUNITY): Payer: Self-pay | Admitting: Urology

## 2020-04-12 DIAGNOSIS — R972 Elevated prostate specific antigen [PSA]: Secondary | ICD-10-CM

## 2020-04-12 DIAGNOSIS — N403 Nodular prostate with lower urinary tract symptoms: Secondary | ICD-10-CM

## 2020-04-26 ENCOUNTER — Other Ambulatory Visit: Payer: Self-pay

## 2020-04-26 ENCOUNTER — Ambulatory Visit (HOSPITAL_COMMUNITY)
Admission: RE | Admit: 2020-04-26 | Discharge: 2020-04-26 | Disposition: A | Discharge status: Home / Self Care | Payer: Medicare HMO | Source: Ambulatory Visit | Attending: Urology | Admitting: Urology

## 2020-04-26 DIAGNOSIS — N403 Nodular prostate with lower urinary tract symptoms: Secondary | ICD-10-CM | POA: Diagnosis not present

## 2020-04-26 DIAGNOSIS — R972 Elevated prostate specific antigen [PSA]: Secondary | ICD-10-CM

## 2020-04-26 DIAGNOSIS — N402 Nodular prostate without lower urinary tract symptoms: Secondary | ICD-10-CM | POA: Diagnosis not present

## 2020-04-26 DIAGNOSIS — R590 Localized enlarged lymph nodes: Secondary | ICD-10-CM | POA: Diagnosis not present

## 2020-04-26 DIAGNOSIS — N400 Enlarged prostate without lower urinary tract symptoms: Secondary | ICD-10-CM | POA: Diagnosis not present

## 2020-04-26 MED ORDER — GADOBUTROL 1 MMOL/ML IV SOLN
9.0000 mL | Freq: Once | INTRAVENOUS | Status: AC | PRN
  Administered 2020-04-26: 9 mL via INTRAVENOUS

## 2020-05-16 DIAGNOSIS — C61 Malignant neoplasm of prostate: Secondary | ICD-10-CM | POA: Diagnosis not present

## 2020-05-16 DIAGNOSIS — R972 Elevated prostate specific antigen [PSA]: Secondary | ICD-10-CM | POA: Diagnosis not present

## 2020-05-27 NOTE — Progress Notes (Signed)
Cardiology Office Note    Date:  05/30/2020   ID:  Corey [name], DOB [birth year], MRN [id number]  PCP:  Corey Battles, MD  Cardiologist: Dr. Martinique  Chief Complaint  Patient presents with  . Follow-up    12 months.  . Thoracic Aortic Aneurysm    History of Present Illness:    Corey [name] is a 75 y.o. male with past medical history of thoracic aortic aneurysm, Stage 3 CKD, chronic LBBB, and minimal CAD by cath in 2011 who presents for follow up   He was seen in 02/2016 and reported having dyspnea on exertion in the warmer temperatures but denied any associated chest discomfort. A repeat CTA was obtained in 03/2016 and showed a stable thoracic aortic aneurysm measuring 4.5cm in diameter with no acute changes (at 4.5 cm in 2015).   He was  seen in Dec 2018 for preoperative cardiac clearance for an  hip replacement. This was performed in January 6433 without complications. Repeat CT chest in February 2019 and March 2021 showed no change in thoracic aneurysm.  In October 2020 he underwent lumbar decompression and fusion for lumbar stenosis with Dr Arnoldo Morale.  In December 2021 he had cystoscopy with removal of right ureteral stone and lithotripsy. Since then has been diagnosed with prostate CA.   On follow up today he is doing well. He notes some SOB when he does yard work but otherwise is OK. Goes to the gym at least twice a week. Is looking forward to playing golf again.   Past Medical History:  Diagnosis Date  . Arthritis 10-22-11   hx. osteoarthritis-knees  . Ascending aortic aneurysm [facility])    Last CT in July 2012  . 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 treated   . Coronary artery disease    minimal per cath in 2011  . Dyspnea   . GERD (gastroesophageal reflux disease)   . Heart murmur   . History of kidney stones    "YRS AGO"  nothing in last 10 yrs  . Hypertension   . Lactose intolerance    used to be, not anymore per patient.  . Left  bundle branch block   . Nephrolithiasis    Cancer    Past Surgical History:  Procedure Laterality Date  . APPENDECTOMY    . BACK SURGERY    . BUNIONECTOMY Right   . CARDIAC CATHETERIZATION  12/22/09   EF60%/minimal nonobstructive atherosclerotic coronary artery disease/normal lt ventricular function/mod aortic root dilatation  . CYSTOSCOPY WITH RETROGRADE PYELOGRAM, URETEROSCOPY AND STENT PLACEMENT Left 07/28/2012   Procedure: CYSTOSCOPY WITH RETROGRADE PYELOGRAM, LEFT URETEROSCOPY ;  Surgeon: Dutch Gray, MD;  Location: [facility];  Service: Urology;  Laterality: Left;  . CYSTOSCOPY/RETROGRADE/URETEROSCOPY/STONE EXTRACTION WITH BASKET  10-22-11   1'12  . CYSTOSCOPY/URETEROSCOPY/HOLMIUM LASER/STENT PLACEMENT Right 02/18/2020   Procedure: CYSTOSCOPY/RETROGRADE/URETEROSCOPY/HOLMIUM LASER/STENT PLACEMENT;  Surgeon: Raynelle Bring, MD;  Location: [facility];  Service: Urology;  Laterality: Right;  . ELBOW SURGERY Right 1990s?   "tendon repaired"  . JOINT REPLACEMENT     both knees & R hip  . KNEE ARTHROSCOPY     "? side"  . LUMBAR SPINE SURGERY  2003   Lumbar fusion -retained hardware([name])  . PARTIAL NEPHRECTOMY Right 10/2011  . TONSILLECTOMY    . TOTAL HIP ARTHROPLASTY Right 03/26/2017   Procedure: RIGHT TOTAL HIP ARTHROPLASTY ANTERIOR APPROACH;  Surgeon: Renette Butters, MD;  Location: [facility];  Service: Orthopedics;  Laterality: Right;  . TOTAL KNEE ARTHROPLASTY     bilateral    Current Medications: Outpatient Medications Prior to Visit  Medication Sig Dispense Refill  . acetaminophen (TYLENOL) 500 MG tablet Take 500 mg by mouth every 6 (six) hours as needed for moderate pain.    Marland Kitchen amLODipine (NORVASC) 5 MG tablet Take 1 tablet (5 mg total) by mouth daily. 90 tablet 1  . aspirin EC 81 MG tablet Take 81 mg by mouth daily.     . diclofenac (VOLTAREN) 25 MG EC tablet Take 25 mg by mouth 2 (two) times daily.    Marland Kitchen losartan (COZAAR) 100 MG tablet Take 1 tablet (100 mg total) by mouth daily.  Patient needs OV for future refills 90 tablet 1  . Melatonin 5 MG CAPS Take 15 mg by mouth at bedtime.     Marland Kitchen omeprazole (PRILOSEC) 20 MG capsule Take 20 mg by mouth every morning.    . potassium citrate (UROCIT-K) 10 MEQ (1080 MG) SR tablet Take 20 mEq by mouth 2 (two) times daily.    . pravastatin (PRAVACHOL) 40 MG tablet Take 40 mg by mouth at bedtime.   3  . tamsulosin (FLOMAX) 0.4 MG CAPS capsule Take 0.8 mg by mouth at bedtime.   11  . ALPRAZolam (XANAX) 0.25 MG tablet Take 0.25 mg by mouth at bedtime.     Marland Kitchen oxyCODONE-acetaminophen (PERCOCET) 5-325 MG tablet Take 1 tablet by mouth every 6 (six) hours as needed for severe pain. 15 tablet 0   No facility-administered medications prior to visit.     Allergies:   Tramadol   Social History   Socioeconomic History  . Marital status: Married    Spouse name: Not on file  . Number of children: 4  . Years of education: Not on file  . Highest education level: Not on file  Occupational History  . Occupation: sales [employer]  Tobacco Use  . Smoking status: Never Smoker  . Smokeless tobacco: Never Used  Vaping Use  . Vaping Use: Never used  Substance and Sexual Activity  . Alcohol use: No  . Drug use: No  . Sexual activity: Yes  Other Topics Concern  . Not on file  Social History Narrative  . Not on file   Social Determinants of Health   Financial Resource Strain: Not on file  Food Insecurity: Not on file  Transportation Needs: Not on file  Physical Activity: Not on file  Stress: Not on file  Social Connections: Not on file     Family History:  The patient's family history includes Cancer in his father; Heart attack in his brother.   Review of Systems:   As noted in HPI. All other systems reviewed and are otherwise negative except as noted above.   Physical Exam:    VS:  BP 124/76 (BP Location: Left Arm, Patient Position: Sitting, Cuff Size: Normal)   Pulse 100   Ht 5' 11.5" (1.816 m)   Wt 201 lb (91.2 kg)   BMI 27.64  kg/m    GENERAL:  Well appearing WM in NAD HEENT:  PERRL, EOMI, sclera are clear. Oropharynx is clear. NECK:  No jugular venous distention, carotid upstroke brisk and symmetric, no bruits, no thyromegaly or adenopathy LUNGS:  Clear to auscultation bilaterally CHEST:  Unremarkable HEART:  RRR,  PMI not displaced or sustained,S1 and S2 within normal limits, no S3, no S4: no clicks, no rubs, gr 8-3/1 systolic murmur RUSB. ABD:  Soft, nontender. BS +,  no masses or bruits. No hepatomegaly, no splenomegaly EXT:  2 + pulses throughout, no edema, no cyanosis no clubbing SKIN:  Warm and dry.  No rashes NEURO:  Alert and oriented x 3. Cranial nerves II through XII intact. PSYCH:  Cognitively intact      Wt Readings from Last 3 Encounters:  05/30/20 201 lb (91.2 kg)  02/08/20 200 lb (90.7 kg)  05/12/19 203 lb (92.1 kg)     Studies/Labs Reviewed:   EKG:  EKG is ordered today.  The ekg ordered today demonstrates NSR, HR 100, with known LBBB. LAD. I have personally reviewed and interpreted this study.   Recent Labs: 10/23/2019: ALT 25 02/08/2020: BUN 36; Creatinine, Ser 2.31; Hemoglobin 15.2; Platelets 181; Potassium 5.1; Sodium 141   Lipid Panel No results found for: CHOL, TRIG, HDL, CHOLHDL, VLDL, LDLCALC, LDLDIRECT   Labs dated 01/31/18: cholesterol 147, triglycerides 130, HDL 44, LDL 77. A1c 5.1%. Creatinine 1.4. otherwise chemistries and Hgb normal.  Dated 02/10/19: cholesterol 136, triglycerides 118, HDL 47, LDL 65. A1c 5.2%. creatinine 1.2. otherwise CMET and CBC normal Dated 02/22/20: cholesterol 128, triglycerides 207, HDL 37, LDL 50. A1c 5.2%. BUN 27, creatinine 2.31. otherwise CMET and CBC normal.   Additional studies/ records that were reviewed today include:   Cardiac Catheterization: 2011    Echocardiogram: 07/2013 Study Conclusions  - Left ventricle: Abnormal septal motion. The cavity size was normal. Wall thickness was increased in a pattern of moderate LVH.  Systolic function was normal. The estimated ejection fraction was in the range of 50% to 55%. Doppler parameters are consistent with abnormal left ventricular relaxation (grade 1 diastolic dysfunction). - Atrial septum: No defect or patent foramen ovale was identified.   CT ANGIOGRAPHY CHEST WITH CONTRAST  TECHNIQUE: Multidetector CT imaging of the chest was performed using the standard protocol during bolus administration of intravenous contrast. Multiplanar CT image reconstructions and MIPs were obtained to evaluate the vascular anatomy.  CONTRAST:  103mL ISOVUE-370 IOPAMIDOL (ISOVUE-370) INJECTION 76%  COMPARISON:  Prior CTA of the chest 04/06/2016  FINDINGS: Cardiovascular: Stable fusiform aneurysmal dilatation of the ascending thoracic aorta of with a transverse diameter 4.5 cm. Conventional 3 vessel arch anatomy. No significant atherosclerotic plaque. The descending and transverse thoracic aorta are normal in caliber. No effacement of the sino-tubular junction. Unremarkable aortic root. The heart is normal in size. No pericardial effusion. Unremarkable pulmonary arteries.  Mediastinum/Nodes: Unremarkable CT appearance of the thyroid gland. No suspicious mediastinal or hilar adenopathy. No soft tissue mediastinal mass. The thoracic esophagus is unremarkable.  Lungs/Pleura: Lungs are clear. No pleural effusion or pneumothorax.  Upper Abdomen: Visualized upper abdominal organs are unremarkable. Stable hyperdense cyst in the upper pole of the right kidney compared to 01/25/2015.  Musculoskeletal: No acute fracture or aggressive appearing lytic or blastic osseous lesion.  Review of the MIP images confirms the above findings.  IMPRESSION: Stable 4.5 cm fusiform ascending thoracic aortic aneurysm. No interval change. Ascending thoracic aortic aneurysm. Recommend semi-annual imaging followup by CTA or MRA and referral to cardiothoracic surgery if not  already obtained. This recommendation follows 2010 ACCF/AHA/AATS/ACR/ASA/SCA/SCAI/SIR/STS/SVM Guidelines for the Diagnosis and Management of Patients With Thoracic Aortic Disease. Circulation. 2010; 121: P824-M353  Signed,  Criselda Peaches, MD  [facility]  [facility]   Electronically Signed   By: Jacqulynn Cadet M.D.   On: 04/29/2017 16:38  CLINICAL DATA:  Follow-up thoracic aortic aneurysm.  CT 04/29/2017  EXAM: CT ANGIOGRAPHY CHEST WITH CONTRAST  TECHNIQUE: Multidetector  CT imaging of the chest was performed using the standard protocol during bolus administration of intravenous contrast. Multiplanar CT image reconstructions and MIPs were obtained to evaluate the vascular anatomy.  CONTRAST:  153mL OMNIPAQUE IOHEXOL 350 MG/ML SOLN  COMPARISON:  CT 04/29/2017.  FINDINGS: Cardiovascular: Ascending thoracic aorta measures 4.3 cm in maximum dimension on sagittal image 100/10. This compares to 4.3 cm on comparison exam. Aneurysm measures 4.2 cm on axial image (80/16) and 4.1 cm on coronal imaging.  The descending thoracic aorta is normal caliber at 2.5 cm.  Mediastinum/Nodes: No axillary or supraclavicular adenopathy. No mediastinal hilar adenopathy. No pericardial effusion. Esophagus normal.  Lungs/Pleura: No suspicious pulmonary nodules. Mild subpleural nodularity in the superior aspect of the RIGHT lower lobe (image 60/7 and 67/7) slightly more prominent but favored benign.  Upper Abdomen: Limited view of the liver, kidneys, pancreas are unremarkable. Normal adrenal glands. Stable mixed density cysts in the upper poles of the kidneys.  Musculoskeletal: No aggressive osseous lesion  Review of the MIP images confirms the above findings.  IMPRESSION: 1. Stable ascending thoracic aortic aneurysm at 4.3 cm. Recommend annual imaging followup by CTA or MRA. This recommendation follows 2010  ACCF/AHA/AATS/ACR/ASA/SCA/SCAI/SIR/STS/SVM Guidelines for the Diagnosis and Management of Patients with Thoracic Aortic Disease. Circulation. 2010; 121: E174-Y814. Aortic aneurysm NOS (ICD10-I71.9). 2. No acute pulmonary parenchymal findings.   Electronically Signed   By: Suzy Bouchard M.D.   On: 05/21/2019 08:46    Assessment:    1. Thoracic aortic aneurysm without rupture ([place])   2. Essential hypertension   3. LBBB (left bundle branch block)   4. Hyperlipidemia LDL goal <70      Plan:   In order of problems listed above:  1. HTN - BP is well controlled. - continue Amlodipine 5mg  daily and Losartan 100mg  daily. Avoid use of NSAIDs with CKD.   2. LBBB - chronic for 20+ years. Catheterization in 2011 showed minimal CAD.  - continue ASA and statin therapy.   3. HLD - followed by PCP. Remains on Pravastatin 20mg  daily. Excellent control.  4. Thoracic Aortic Aneurysm -  CT in March 2021 showed no change with dimension of 4.3 cm. - aortic dimensions have been stable on CT since 2011. Would suggest we can reduce surveillance to every 2-3 years.    5. Stage 3 CKD - baseline creatinine 1.5 - 1.6. up to 2.31 in Dec with urologic issues. Follow up with urology  6. DOE. I reviewed his CT scan. He has no coronary or aortic calcification so I think his CV risk is low.   7. Prostate CA. Being considered for RT    Signed, Aneya Daddona Martinique, MD  05/30/2020 9:08 AM    [place] [address] [place], [place] [address] Phone: [phone]

## 2020-05-30 ENCOUNTER — Ambulatory Visit: Payer: Medicare HMO | Admitting: Cardiology

## 2020-05-30 ENCOUNTER — Encounter: Payer: Self-pay | Admitting: Cardiology

## 2020-05-30 ENCOUNTER — Other Ambulatory Visit: Payer: Self-pay

## 2020-05-30 VITALS — BP 124/76 | HR 100 | Ht 71.5 in | Wt 201.0 lb

## 2020-05-30 DIAGNOSIS — E785 Hyperlipidemia, unspecified: Secondary | ICD-10-CM | POA: Diagnosis not present

## 2020-05-30 DIAGNOSIS — I447 Left bundle-branch block, unspecified: Secondary | ICD-10-CM

## 2020-05-30 DIAGNOSIS — I712 Thoracic aortic aneurysm, without rupture, unspecified: Secondary | ICD-10-CM

## 2020-05-30 DIAGNOSIS — I10 Essential (primary) hypertension: Secondary | ICD-10-CM

## 2020-06-01 ENCOUNTER — Ambulatory Visit: Payer: Medicare HMO | Admitting: Cardiology

## 2020-06-02 ENCOUNTER — Other Ambulatory Visit (HOSPITAL_COMMUNITY): Payer: Self-pay | Admitting: Urology

## 2020-06-02 DIAGNOSIS — C61 Malignant neoplasm of prostate: Secondary | ICD-10-CM

## 2020-06-06 ENCOUNTER — Encounter: Payer: Self-pay | Admitting: Radiation Oncology

## 2020-06-06 NOTE — Progress Notes (Signed)
GU Location of Tumor / Histology: prostatic adenocarcinoma  If Prostate Cancer, Gleason Score is (4 + 4) and PSA is (4.80). Prostate volume: 85.5 g.  Corey [name] presented with an elevated PSA, abnormal DRE, and abnormal MRI.  Biopsies of prostate (if applicable) revealed:    Past/Anticipated interventions by urology, if any: prostate biopsy, referral to [facility], PET scan scheduled for 06/17/2020 at 1200\  Past/Anticipated interventions by medical oncology, if any: no  Weight changes, if any: Denies  Bowel/Bladder complaints, if any: IPSS 3. SHIM 18. Denies dysuria. Reports hematuria. Denies urinary leakage  Reports taking tamsulosin 0.4 daily and sildenafil prn for ED. Denies any bowel complaints.  Nausea/Vomiting, if any: denies  Pain issues, if any:  Reports back pain.  SAFETY ISSUES:  Prior radiation? denies  Pacemaker/ICD? denies  Possible current pregnancy? no, male patient  Is the patient on methotrexate? denies  Current Complaints / other details:  75 year old male. Married with 4 kids. Enjoys golf. Resides in [place]. Has SOB when working in his yard. Reports he enjoys playing golf.

## 2020-06-07 ENCOUNTER — Encounter: Payer: Self-pay | Admitting: General Practice

## 2020-06-07 ENCOUNTER — Inpatient Hospital Stay: Payer: Medicare HMO | Attending: Oncology | Admitting: Oncology

## 2020-06-07 ENCOUNTER — Encounter: Payer: Self-pay | Admitting: Medical Oncology

## 2020-06-07 ENCOUNTER — Ambulatory Visit
Admission: RE | Admit: 2020-06-07 | Discharge: 2020-06-07 | Disposition: A | Discharge status: Home / Self Care | Payer: Medicare HMO | Source: Ambulatory Visit | Attending: Radiation Oncology | Admitting: Radiation Oncology

## 2020-06-07 ENCOUNTER — Other Ambulatory Visit: Payer: Self-pay

## 2020-06-07 ENCOUNTER — Encounter: Payer: Self-pay | Admitting: Radiation Oncology

## 2020-06-07 VITALS — BP 142/80 | HR 78 | Temp 98.3°F | Resp 18 | Ht 71.5 in | Wt 200.0 lb

## 2020-06-07 DIAGNOSIS — I2510 Atherosclerotic heart disease of native coronary artery without angina pectoris: Secondary | ICD-10-CM | POA: Insufficient documentation

## 2020-06-07 DIAGNOSIS — Z7982 Long term (current) use of aspirin: Secondary | ICD-10-CM | POA: Diagnosis not present

## 2020-06-07 DIAGNOSIS — I714 Abdominal aortic aneurysm, without rupture: Secondary | ICD-10-CM | POA: Insufficient documentation

## 2020-06-07 DIAGNOSIS — Z79899 Other long term (current) drug therapy: Secondary | ICD-10-CM | POA: Insufficient documentation

## 2020-06-07 DIAGNOSIS — Z87442 Personal history of urinary calculi: Secondary | ICD-10-CM | POA: Diagnosis not present

## 2020-06-07 DIAGNOSIS — M1990 Unspecified osteoarthritis, unspecified site: Secondary | ICD-10-CM | POA: Diagnosis not present

## 2020-06-07 DIAGNOSIS — I10 Essential (primary) hypertension: Secondary | ICD-10-CM | POA: Diagnosis not present

## 2020-06-07 DIAGNOSIS — Z800 Family history of malignant neoplasm of digestive organs: Secondary | ICD-10-CM | POA: Diagnosis not present

## 2020-06-07 DIAGNOSIS — Z8042 Family history of malignant neoplasm of prostate: Secondary | ICD-10-CM | POA: Insufficient documentation

## 2020-06-07 DIAGNOSIS — I712 Thoracic aortic aneurysm, without rupture: Secondary | ICD-10-CM | POA: Insufficient documentation

## 2020-06-07 DIAGNOSIS — C61 Malignant neoplasm of prostate: Secondary | ICD-10-CM

## 2020-06-07 DIAGNOSIS — M129 Arthropathy, unspecified: Secondary | ICD-10-CM | POA: Diagnosis not present

## 2020-06-07 DIAGNOSIS — Z85828 Personal history of other malignant neoplasm of skin: Secondary | ICD-10-CM | POA: Diagnosis not present

## 2020-06-07 DIAGNOSIS — R011 Cardiac murmur, unspecified: Secondary | ICD-10-CM | POA: Insufficient documentation

## 2020-06-07 DIAGNOSIS — K219 Gastro-esophageal reflux disease without esophagitis: Secondary | ICD-10-CM | POA: Diagnosis not present

## 2020-06-07 DIAGNOSIS — R972 Elevated prostate specific antigen [PSA]: Secondary | ICD-10-CM | POA: Diagnosis not present

## 2020-06-07 DIAGNOSIS — I447 Left bundle-branch block, unspecified: Secondary | ICD-10-CM | POA: Insufficient documentation

## 2020-06-07 HISTORY — DX: Malignant neoplasm of prostate: C61

## 2020-06-07 HISTORY — DX: Malignant neoplasm of unspecified ovary: C56.9

## 2020-06-07 HISTORY — DX: Unspecified malignant neoplasm of skin, unspecified: C44.90

## 2020-06-07 NOTE — Consult Note (Signed)
[facility]     06/07/2020   --------------------------------------------------------------------------------   Corey [name]  MRN: [id number]  DOB: December 16, 1945, 75 year old Male  SSN: [id number]   PRIMARY CARE:  Ermalene Searing. Philip Aspen, MD  REFERRING:  Raynelle Bring, Eduardo Osier  Takita Riecke:  Raynelle Bring, M.D.  LOCATION:  [facility] [phone]     --------------------------------------------------------------------------------   CC/HPI: CC: Prostate Cancer   PCP: Dr. Leanna Battles  Location of consult: [facility] - Prostate Cancer Multidisciplinary Clinic   Corey [name] is a 75 year old gentleman with a past medical history significant for a left bundle branch block, urolithiasis, hypertension, BPH, hyperlipidemia, and GERD. I initially evaluated him in 2013 when he was found to have two right sided renal lesions and he was treated with a right RAL partial nephrectomy for stage I, Grade I clear cell and papillary tumors (separate tumors). I recently treated him for a ureteral stone in December and in follow up, he had a PSA that was elevated at 4.8 and a nodule at the right medial mid prostate. He underwent an MR of the prostate on 04/26/20 that demonstrated a left, lateral posterior PI-RADS 5 lesion with a 1.5 cm area of capsular abutment, and two other PI-RADS 3 lesions. An MR/US fusion biopsy was performed on 05/16/20 and indicated Gleason 4+4=8 adenocarcinoma with 7 out of a total of 18 biopsy cores positive for malignancy. This included 2 out of 4 of the targeted PI-RADS 5 lesion and 5 out of 12 of the systematic cores with the majority of the positive cores on the left lateral prostate consistent with his MRI findings but not consistent with his DRE findings.   Family history: None.   Imaging studies:  MRI (04/26/20): No EPE (but 1.5 cm abutment of capsule), SVI, LAD, or bone lesions.  PSMA PET scan: PENDING   PMH: He has a history of renal  cell carcinoma, left bundle branch block, urolithiasis, hypertension, BPH, hyperlipidemia, and GERD.  PSH: Right RAL partial nephrectomy, appendectomy.   TNM stage: cT2a N0 Mx  PSA: 4.8  Gleason score: 4+4=8 (GG 4)  Biopsy (05/16/20): 7/18 cores positive  Left: L lateral apex (20%, 4+3=7, PNI), L lateral mid (50%, 4+4=8), L mid (20%, 4+3=7), L lateral base (5%, 3+4=7)  Right: R base (20%, 3+4=7)  Targeted: ROI I (PI-RADS 5): 2/4 cores (5% - 4+3=7 and 5%, 3+4=7), ROI 2 (PI-RADS 3): negative  Prostate volume: 85.5 cc   Nomogram  OC disease: 42%  EPE: 57%  SVI: 8%  LNI: 12%  PFS (5 year, 10 year): 54%, 38%   Urinary function: IPSS is 3. He is now only taking tamsulosin 0.4 mg.  Erectile function: SHIM score is 18.     ALLERGIES: No Allergies    MEDICATIONS: Diazepam 10 mg tablet Take 10 mg 30-60 minutes prior to your procedure  Diazepam 10 mg tablet Take 10 mg 30-60 minutes prior to your MRI  Hydrocodone-Acetaminophen 5 mg-325 mg tablet 1-2 tablet PO Q 6 H  Levofloxacin 750 mg tablet Please take one tablet the morning of your biopsy.  Potassium Citrate Er 10 meq (1,080 mg) tablet, extended release TAKE 2 TABLETS BY MOUTH TWICE A DAY  Tamsulosin Hcl 0.4 mg capsule TAKE ONE CAPSULE DAILY AT BEDTIME.  Tamsulosin Hcl 0.4 mg capsule 2 capsule PO Q HS  Amlodipine Besilate  Aspirin 81 MG TABS Oral  Diclofenac Sodium 25 mg tablet, delayed release 0 Oral  Losartan Potassium  100 mg tablet Oral  Pravastatin Sodium 40 mg tablet Oral  PriLOSEC CPDR Oral     GU PSH: Cysto Bladder Stone <2.5cm - 2014 Cysto Remove Stent FB Sim - 02/18/2020 Cysto Uretero Lithotripsy - 2011 Cystoscopy Insert Stent - 2011 Cystoscopy Ureteroscopy - 2014 ESWL - 2014, 2009, 2009, 2007 Locm 300-399Mg/Ml Iodine,1Ml - 01/07/2020 Partial nephrectomy (laparoscopic) - 2013 Prostate Needle Biopsy - 05/16/2020 Ureteroscopic laser litho, Right - 02/18/2020       PSH Notes: Cystoscopy With Fragmentation Of Bladder  Calculus, Cystoscopy With Ureteroscopy Left, Lithotripsy, Appendectomy, Kidney Surgery Laparoscopic Partial Nephrectomy, Cystoscopy With Ureteroscopy With Lithotripsy, Cystoscopy With Insertion Of Ureteral Stent Left, Lithotripsy, Lithotripsy, Knee Arthroscopy With Debridement Of Articular Cartilage, Lithotripsy, Elbow Surgery, Back Surgery     lipoma removed from left wrist 2019  Skin cancer removed from nose 2019  Back surgery - Spinal fusion 01/2019   NON-GU PSH: Appendectomy - 2013 Hip Replacement, Right Knee Arthroscopy - 2007 Surgical Pathology, Gross And Microscopic Examination For Prostate Needle - 05/16/2020     GU PMH: Elevated PSA - 05/16/2020, - 03/22/2020, - 04/07/2019 Prostate nodule w/ LUTS - 05/16/2020, - 03/22/2020 BPH w/LUTS - 03/22/2020, - 12/17/2019, Benign prostatic hyperplasia (BPH) with straining on urination, - 2016 Ureteral calculus - 03/22/2020, - 01/15/2020, Calculus of ureter, - 2016 Weak Urinary Stream - 03/22/2020, - 12/17/2019, - 2017 Microscopic hematuria - 01/07/2020, - 12/17/2019 ED due to arterial insufficiency - 12/17/2019 History of urolithiasis - 12/17/2019, Nephrolithiasis, - 2014 Post-void dribbling - 04/07/2019 History of kidney cancer - 11/21/2018 Hematospermia - 09/25/2018 Abdominal Pain Unspec - 2018 Renal calculus, Nephrolithiasis - 2016 Renal cell carcinoma, right, Renal cell carcinoma of right kidney - 2016 Bladder Stone, Bladder calculus - 2014      PMH Notes:   1) Renal cell carcinoma: He is s/p a right robotic partial nephrectomy for two lesions on 11/01/11. 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 Joelyn Oms, Dr. Serita Butcher, Dr. Karsten Ro, and Dr. Rosana Hoes. His first stone episode was at age 26 in 51 when he underwent cystoscopy with basket extraction. He has since undergone numerous procedures including ureteroscopic stone removal in 1990 and 2011 and ESWL in 2001, 2005 x 2, 2007, and 2009 x 2.   He has been noted to have low urine volumes and hypercalciuria and hypocitraturia.   Current medical management: Potassium citrate 20 mEq po bid  Prior medical therapy: HCTZ (could not tolerate)   Dec 2013: ESWL of left UPJ calculus  Dec 2021: R ureteroscopic laser lithotripsy  Jan 2022: 24 hr urine - Normal with low risk of calcium oxalate stone formation   3) BPH/LUTS: While taking tamsulosin for medical expulsion therapy for his ureteral stone, he found this to be helpful for improving his voiding function and continued taking it.   Current treatment: Tamsulosin 0.8 mg   4) Hematuria: He was noted to have microscopic hematuria in September 2021.   Oct 2021: Cysto - bladder calculus, CT imaging - Two  right ureteral stones   5) CKD: He has been noted to have an elevated creatinine in the past thought to be related to chronic NSAID use with diclofenac.   Baseline renal function: Cr 1.1 (May 2013)   ** Medical comorbidities: He has a history of chest pain and left bundle branch block. He underwent a cardiac evaluation by Dr. Peter Martinique in 2011 which included a cardiac catheterization and apparently did not require intervention.     NON-GU PMH: Hypercalciuria, Hypercalciuria - 2014 Personal history of other diseases of the circulatory system, History of hypertension - 2014 Personal history of other diseases of the digestive system, History of esophageal reflux - 2014    FAMILY HISTORY: Adenocarcinoma Of The Pancreas - Father Death In  The Family Father - Runs In Family Death In The Family Mother - Runs In Family Urologic Disorder - Runs In Family   SOCIAL HISTORY: Marital Status: Married Preferred Language: English; Ethnicity: Not Hispanic Or Latino; Race: White Current Smoking Status: Patient has never smoked.  Does not drink anymore.  Drinks 1 caffeinated drink per day.     Notes: Never A Smoker, Occupation:, Marital History - Currently Married, Alcohol Use   REVIEW OF SYSTEMS:    GU Review Male:   Patient denies frequent urination, hard to postpone urination, burning/ pain with urination, get up at night to urinate, leakage of urine, stream starts and stops, trouble starting your streams, and have to strain to urinate .  Gastrointestinal (Upper):   Patient denies nausea and vomiting.  Gastrointestinal (Lower):   Patient denies diarrhea and constipation.  Constitutional:   Patient denies fever, night sweats, weight loss, and fatigue.  Skin:   Patient denies skin rash/ lesion and itching.  Eyes:   Patient denies blurred vision and double vision.  Ears/ Nose/ Throat:   Patient denies sore throat and sinus problems.  Hematologic/Lymphatic:   Patient denies swollen glands and easy bruising.  Cardiovascular:   Patient denies leg swelling and chest pains.  Respiratory:   Patient denies cough and shortness of breath.  Endocrine:   Patient denies excessive thirst.  Musculoskeletal:   Patient denies back pain and joint pain.  Neurological:   Patient denies headaches and dizziness.  Psychologic:   Patient denies depression and anxiety.   VITAL SIGNS: None   MULTI-SYSTEM PHYSICAL EXAMINATION:    Constitutional: Well-nourished. No physical deformities. Normally developed. Good grooming.     Complexity of Data:  Lab Test Review:   PSA  Records Review:   Pathology Reports  X-Ray Review: MRI Prostate GSORAD: Reviewed Films.     03/22/20 04/08/19 12/13/09 04/17/07  PSA  Total PSA 4.80 ng/mL 3.40 ng/mL 1.31  0.95   Free  PSA 1.40 ng/mL 0.73 ng/mL    % Free PSA 29 % PSA 21 % PSA      PROCEDURES: None   ASSESSMENT:      ICD-10 Details  1 GU:   Prostate Cancer - C61    PLAN:           Document Letter(s):  Created for Patient: Clinical Summary         Notes:   1. High risk prostate cancer: I had a detailed discussion with Corey [name] and his wife today. We discussed the need to complete his staging and his PSMA PET scan is scheduled for April 1. Assuming he does not have evidence of metastatic disease, I recommended treatment of curative intent considering his disease parameters and life expectancy. The  patient was counseled about the natural history of prostate cancer and the standard treatment options that are available for prostate cancer. It was explained to him how his age and life expectancy, clinical stage, Gleason score, and PSA affect his prognosis, the decision to proceed with additional staging studies, as well as how that information influences recommended treatment strategies. We discussed the roles for active surveillance, radiation therapy, surgical therapy, androgen deprivation, as well as ablative therapy options for the treatment of prostate cancer as appropriate to his individual cancer situation. We discussed the risks and benefits of these options with regard to their impact on cancer control and also in terms of potential adverse events, complications, and impact on quality of life particularly related to urinary and sexual function. The patient was encouraged to ask questions throughout the discussion today and all questions were answered to his stated satisfaction. In addition, the patient was provided with and/or directed to appropriate resources and literature for further education about prostate cancer and treatment options.   He is going to discuss his options further today with Dr. Alen Blew and Dr. Tammi Klippel. He will consider his options of either primary surgical therapy vs EBRT with long  term ADT. He will notify me of his decision for treatment or if he has further questions as he makes his final decision. In the meantime, we will await his PSMA PET results to ensure he has localized disease. If he proceeded with surgery, I would have him return for an exam and further discussion but could get him scheduled tentatively. My plan would be to perform a BNS (partial left sided) RAL RP with BPLND.   2. BPH/LUTS: Continue tamsulosin 0.79m.   3. Urolithiasis: Continue current prevention. Recent 24 hr urine was normal except for recommendations for improved hydration.   CC: Dr. DLeanna Battles Dr. FZola Button Dr. MTyler Pita

## 2020-06-07 NOTE — Progress Notes (Signed)
Radiation Oncology         [phone] ________________________________  Multidisciplinary Prostate Cancer Clinic  Initial Radiation Oncology Consultation  Name: JAMARQUES PINEDO MRN: [id number]  Date: 06/07/2020  DOB: 09/16/1945  AL:[id number], Quillian Quince, MD  Raynelle Bring, MD   REFERRING PHYSICIAN: Raynelle Bring, MD  DIAGNOSIS: 75 y.o. gentleman with stage T2a adenocarcinoma of the prostate with a Gleason's score of 4+4 and a PSA of 4.8.    ICD-10-CM   1. Malignant neoplasm of prostate ([place])  C61     HISTORY OF PRESENT ILLNESS::Cylus D Busche is a 75 y.o. gentleman. He is an established urology patient followed by Dr. Alinda Money for history of renal cell carcinoma (s/p right partial nephrectomy, stage pT1a in 2013), urolithiasis, BPH with BOO and fluctuating/elevated PSA, and a history of gross hematuria in 11/2019. At follow up on 03/23/20,  digital rectal examination was performed at that time revealing a suspicious right medial mid prostate nodule. A repeat PSA that day showed elevation at 4.8 as compared to 3.40 in 03/2019. This prompted a prostate MRI on 04/26/20 which revealed a 1.8 cm PI-RADS 5 lesion in the left posterolateral peripheral zone lesion of the mid gland and apex that has over 1.5 cm of surface contact with the capsule, suspicious for ECE.  Additionally, there were two PI-RADS 3 lesions in the left transition zone but no evidence of seminal vesicle involvement or lymphadenopathy. The prostate volume was estimated at 65.92 gm. The patient proceeded to MRI fusion biopsies of the prostate on 05/16/20.  The prostate volume measured 85.5 cc.  Out of 18 core biopsies, 7 were positive.  The maximum Gleason score was 4+4, and this was seen in the left mid lateral core. Additionally, Gleason 4+3 was seen in one sample from ROI MRI lesion #1 (small focus, with PNI), as well as in the left apex lateral (with PNI), and left mid cores. Gleason 3+4 was seen in the right base, left base lateral (small  focus), and one sample from ROI MRI lesion #1 (small focus).  He is scheduled for PSMA scan on 06/17/20 to complete his disease staging.  The patient reviewed the biopsy results with his urologist and he has kindly been referred today to the multidisciplinary prostate cancer clinic for presentation of pathology and radiology studies in our conference for discussion of potential radiation treatment options and clinical evaluation.  PREVIOUS RADIATION THERAPY: No  PAST MEDICAL HISTORY:  has a past medical history of Arthritis (10-22-11), Ascending aortic aneurysm ([place]), Coronary artery disease, Dyspnea, GERD (gastroesophageal reflux disease), Heart murmur, History of kidney stones, Hypertension, Lactose intolerance, Left bundle branch block, Nephrolithiasis, Ovarian cancer ([place]), Prostate cancer ([place]), and Skin cancer.    PAST SURGICAL HISTORY: Past Surgical History:  Procedure Laterality Date  . APPENDECTOMY    . BACK SURGERY    . BUNIONECTOMY Right   . CARDIAC CATHETERIZATION  12/22/09   EF60%/minimal nonobstructive atherosclerotic coronary artery disease/normal lt ventricular function/mod aortic root dilatation  . CYSTOSCOPY WITH RETROGRADE PYELOGRAM, URETEROSCOPY AND STENT PLACEMENT Left 07/28/2012   Procedure: CYSTOSCOPY WITH RETROGRADE PYELOGRAM, LEFT URETEROSCOPY ;  Surgeon: Dutch Gray, MD;  Location: [facility];  Service: Urology;  Laterality: Left;  . CYSTOSCOPY/RETROGRADE/URETEROSCOPY/STONE EXTRACTION WITH BASKET  10-22-11   1'12  . CYSTOSCOPY/URETEROSCOPY/HOLMIUM LASER/STENT PLACEMENT Right 02/18/2020   Procedure: CYSTOSCOPY/RETROGRADE/URETEROSCOPY/HOLMIUM LASER/STENT PLACEMENT;  Surgeon: Raynelle Bring, MD;  Location: [facility];  Service: Urology;  Laterality: Right;  . ELBOW SURGERY Right 1990s?   "tendon repaired"  . JOINT REPLACEMENT  both knees & R hip  . KNEE ARTHROSCOPY     "? side"  . LUMBAR SPINE SURGERY  2003   Lumbar fusion -retained hardware([name])  . PARTIAL NEPHRECTOMY  Right 10/2011  . TONSILLECTOMY    . TOTAL HIP ARTHROPLASTY Right 03/26/2017   Procedure: RIGHT TOTAL HIP ARTHROPLASTY ANTERIOR APPROACH;  Surgeon: Renette Butters, MD;  Location: [facility];  Service: Orthopedics;  Laterality: Right;  . TOTAL KNEE ARTHROPLASTY     bilateral    FAMILY HISTORY: family history includes Heart attack in his brother; Pancreatic cancer in his father; Prostate cancer in his father.  SOCIAL HISTORY:  reports that he has never smoked. He has never used smokeless tobacco. He reports that he does not drink alcohol and does not use drugs.  ALLERGIES: Tramadol  MEDICATIONS:  Current Outpatient Medications  Medication Sig Dispense Refill  . amLODipine (NORVASC) 5 MG tablet Take 1 tablet (5 mg total) by mouth daily. 90 tablet 1  . aspirin EC 81 MG tablet Take 81 mg by mouth daily.     Marland Kitchen losartan (COZAAR) 100 MG tablet Take 1 tablet (100 mg total) by mouth daily. Patient needs OV for future refills 90 tablet 1  . potassium citrate (UROCIT-K) 10 MEQ (1080 MG) SR tablet Take 20 mEq by mouth 2 (two) times daily.    . pravastatin (PRAVACHOL) 40 MG tablet Take 40 mg by mouth at bedtime.   3  . tamsulosin (FLOMAX) 0.4 MG CAPS capsule Take 0.8 mg by mouth at bedtime.   11   No current facility-administered medications for this encounter.    REVIEW OF SYSTEMS:  On review of systems, the patient reports that he is doing well overall. He denies any chest pain, shortness of breath, cough, fevers, chills, night sweats, unintended weight changes. He denies any bowel disturbances, and denies abdominal pain, nausea or vomiting. He denies any new musculoskeletal or joint aches or pains. His IPSS was 3, indicating mild urinary symptoms. His SHIM was 18, indicating he has mild erectile dysfunction. A complete review of systems is obtained and is otherwise negative.   PHYSICAL EXAM:  Wt Readings from Last 3 Encounters:  06/07/20 200 lb (90.7 kg)  05/30/20 201 lb (91.2 kg)  02/08/20 200 lb  (90.7 kg)   Temp Readings from Last 3 Encounters:  06/07/20 98.3 F (36.8 C)  02/18/20 97.9 F (36.6 C)  02/08/20 97.8 F (36.6 C) (Oral)   BP Readings from Last 3 Encounters:  06/07/20 (!) 142/80  05/30/20 124/76  02/18/20 127/81   Pulse Readings from Last 3 Encounters:  06/07/20 78  05/30/20 100  02/18/20 100   Pain Assessment Pain Score: 0-No pain/10  In general this is a well appearing Caucasian male in no acute distress. He is alert and oriented x4 and appropriate throughout the examination. HEENT reveals that the patient is normocephalic, atraumatic. EOMs are intact. PERRLA. Skin is intact without any evidence of gross lesions. Cardiopulmonary assessment is negative for acute distress and he exhibits normal effort. The abdomen is soft, non tender, non distended. Lower extremities are negative for pretibial pitting edema, deep calf tenderness, cyanosis or clubbing.  KPS = 100  100 - Normal; no complaints; no evidence of disease. 90   - Able to carry on normal activity; minor signs or symptoms of disease. 80   - Normal activity with effort; some signs or symptoms of disease. 8   - Cares for self; unable to carry on normal activity or to  do active work. 60   - Requires occasional assistance, but is able to care for most of his personal needs. 50   - Requires considerable assistance and frequent medical care. 8   - Disabled; requires special care and assistance. 80   - Severely disabled; hospital admission is indicated although death not imminent. 64   - Very sick; hospital admission necessary; active supportive treatment necessary. 10   - Moribund; fatal processes progressing rapidly. 0     - Dead  Karnofsky DA, Abelmann [place], Craver LS and Burchenal [facility] [phone]) The use of the nitrogen mustards in the palliative treatment of carcinoma: with particular reference to bronchogenic carcinoma Cancer 1 [id number]   LABORATORY DATA:  Lab Results  Component Value Date   WBC 5.0  02/08/2020   HGB 15.2 02/08/2020   HCT 43.9 02/08/2020   MCV 93.8 02/08/2020   PLT 181 02/08/2020   Lab Results  Component Value Date   NA 141 02/08/2020   K 5.1 02/08/2020   CL 109 02/08/2020   CO2 24 02/08/2020   Lab Results  Component Value Date   ALT 25 10/23/2019   AST 23 10/23/2019   ALKPHOS 104 10/23/2019   BILITOT 1.6 (H) 10/23/2019     RADIOGRAPHY: No results found.    IMPRESSION/PLAN: 75 y.o. gentleman with Stage T2a adenocarcinoma of the prostate with a Gleason's score of 4+4 and a PSA of 4.8.  We discussed the patient's workup and outlined the nature of prostate cancer in this setting. The patient's T stage, Gleason's score, and PSA put him into the high risk group. Accordingly, he is eligible for a variety of potential treatment options including prostatectomy or LT-ADT concurrent with 8 weeks of external radiation.  He is not felt to be a candidate for brachytherapy boost with a prostate volume of 85 gm and significant LUTS despite medical therapy with Flomax. We discussed the available radiation techniques, and focused on the details and logistics of delivery. We discussed and outlined the risks, benefits, short and long-term effects associated with radiotherapy and compared and contrasted these with prostatectomy. We discussed the role of SpaceOAR gel in reducing the rectal toxicity associated with radiotherapy. We also detailed the role of ADT in the treatment of high risk prostate cancer and outlined the associated side effects that could be expected with this therapy.  He was encouraged to ask questions that were answered to his stated satisfaction.  At the end of the conversation, the patient remains undecided but appears to be leaning towards proceeding with 8 weeks of external beam therapy in combination with ADT. He has not received his first ADT injection so we will await his final decision and should he elect to proceed with radiotherapy, we will share our  discussion with Dr. Alinda Money and make arrangements for a follow up visit to start ADT and then coordinate for fiducial markers and SpaceOAR gel placement, prior to simulation, in anticipation of beginning daily radiation treatments approximately 8 weeks after starting ADT. The patient appears to have a good understanding of his disease and our treatment recommendations which are of curative intent and is in agreement with the stated plan.       Nicholos Johns, PA-C    Tyler Pita, MD  [facility] Direct Dial: [phone]  Fax: [phone] [email or web address]  Skype  [email or web address]   This document serves as a record of services personally performed by Tyler Pita, MD and Freeman Caldron, PA-C. It was created on  their behalf by Wilburn Mylar, a trained medical scribe. The creation of this record is based on the scribe's personal observations and the provider's statements to them. This document has been checked and approved by the attending provider.

## 2020-06-07 NOTE — Progress Notes (Signed)
Reason for the request:    Prostate cancer  HPI: I was asked by Dr. Alinda Money to evaluate Corey [name] for the evaluation of prostate cancer.  He is a 75 year old man with history of urolithiasis but no significant comorbid conditions.  He was found to have a PSA 4.8 in January 2022 and a digital rectal examination that was abnormal indicating a lesion on the left.  Based on these findings MRI of the prostate obtained on April 26, 2018 showed which showed a PI-RADS 5 lesion in the left posterior lateral peripheral zone of the mid gland and apex.  Based on these findings, he underwent a prostate biopsy on May 16, 2020 which showed a Gleason score 4+4 = 8 and 1 core and Gleason score 4+3 equal 7 in 2 cores.  There are 2 other cores with Gleason score 3+4 = 7.  The one lesion was targeted showed a Gleason score of 4+3 = 7.  His MRI did not show any evidence of lymphadenopathy or bone disease.  His PET scan is scheduled but not completed.  Clinically, he reports lower urinary tract symptoms letter treated adequately with tamsulosin.  He does report nocturia but no hematuria or dysuria.  Performance status quality of life remain excellent.     Past Medical History:  Diagnosis Date  . Arthritis 10-22-11   hx. osteoarthritis-knees  . Ascending aortic aneurysm [facility])    Last CT in July 2012  . Coronary artery disease    minimal per cath in 2011  . Dyspnea   . GERD (gastroesophageal reflux disease)   . Heart murmur   . History of kidney stones    "YRS AGO"  nothing in last 10 yrs  . Hypertension   . Lactose intolerance    used to be, not anymore per patient.  . Left bundle branch block   . Nephrolithiasis    Cancer  . Ovarian cancer ([place])    kidney cancer  . Prostate cancer ([place])   . Skin cancer   :  Past Surgical History:  Procedure Laterality Date  . APPENDECTOMY    . BACK SURGERY    . BUNIONECTOMY Right   . CARDIAC CATHETERIZATION  12/22/09   EF60%/minimal nonobstructive  atherosclerotic coronary artery disease/normal lt ventricular function/mod aortic root dilatation  . CYSTOSCOPY WITH RETROGRADE PYELOGRAM, URETEROSCOPY AND STENT PLACEMENT Left 07/28/2012   Procedure: CYSTOSCOPY WITH RETROGRADE PYELOGRAM, LEFT URETEROSCOPY ;  Surgeon: Dutch Gray, MD;  Location: [facility];  Service: Urology;  Laterality: Left;  . CYSTOSCOPY/RETROGRADE/URETEROSCOPY/STONE EXTRACTION WITH BASKET  10-22-11   1'12  . CYSTOSCOPY/URETEROSCOPY/HOLMIUM LASER/STENT PLACEMENT Right 02/18/2020   Procedure: CYSTOSCOPY/RETROGRADE/URETEROSCOPY/HOLMIUM LASER/STENT PLACEMENT;  Surgeon: Raynelle Bring, MD;  Location: [facility];  Service: Urology;  Laterality: Right;  . ELBOW SURGERY Right 1990s?   "tendon repaired"  . JOINT REPLACEMENT     both knees & R hip  . KNEE ARTHROSCOPY     "? side"  . LUMBAR SPINE SURGERY  2003   Lumbar fusion -retained hardware([name])  . PARTIAL NEPHRECTOMY Right 10/2011  . TONSILLECTOMY    . TOTAL HIP ARTHROPLASTY Right 03/26/2017   Procedure: RIGHT TOTAL HIP ARTHROPLASTY ANTERIOR APPROACH;  Surgeon: Renette Butters, MD;  Location: [facility];  Service: Orthopedics;  Laterality: Right;  . TOTAL KNEE ARTHROPLASTY     bilateral  :   Current Outpatient Medications:  .  acetaminophen (TYLENOL) 500 MG tablet, Take 500 mg by mouth every 6 (six) hours as needed for moderate pain., Disp: , Rfl:  .  amLODipine (NORVASC) 5 MG tablet, Take 1 tablet (5 mg total) by mouth daily., Disp: 90 tablet, Rfl: 1 .  aspirin EC 81 MG tablet, Take 81 mg by mouth daily. , Disp: , Rfl:  .  diazepam (VALIUM) 10 MG tablet, Take 10 mg 30-60 minutes prior to your procedure, Disp: , Rfl:  .  diclofenac (VOLTAREN) 25 MG EC tablet, Take 25 mg by mouth 2 (two) times daily., Disp: , Rfl:  .  FLUZONE HIGH-DOSE QUADRIVALENT 0.7 ML SUSY, , Disp: , Rfl:  .  gabapentin (NEURONTIN) 300 MG capsule, TAKE 1 CAPSULE BY MOUTH THREE TIMES A DAY, Disp: , Rfl:  .  losartan (COZAAR) 100 MG tablet, Take 1 tablet (100 mg  total) by mouth daily. Patient needs OV for future refills, Disp: 90 tablet, Rfl: 1 .  Melatonin 5 MG CAPS, Take 15 mg by mouth at bedtime. , Disp: , Rfl:  .  methylPREDNISolone (MEDROL) 4 MG TBPK tablet, Take orally as directed, Disp: , Rfl:  .  omeprazole (PRILOSEC) 20 MG capsule, Take 20 mg by mouth every morning., Disp: , Rfl:  .  Oxycodone HCl 10 MG TABS, Take by mouth., Disp: , Rfl:  .  potassium citrate (UROCIT-K) 10 MEQ (1080 MG) SR tablet, Take 20 mEq by mouth 2 (two) times daily., Disp: , Rfl:  .  pravastatin (PRAVACHOL) 40 MG tablet, Take 40 mg by mouth at bedtime. , Disp: , Rfl: 3 .  tamsulosin (FLOMAX) 0.4 MG CAPS capsule, Take 0.8 mg by mouth at bedtime. , Disp: , Rfl: 11:  Allergies  Allergen Reactions  . Tramadol Shortness Of Breath  :  Family History  Problem Relation Age of Onset  . Cancer Father        pancreatic  . Heart attack Brother   . Breast cancer Neg Hx   . Prostate cancer Neg Hx   . Colon cancer Neg Hx   . Pancreatic cancer Neg Hx   :  Social History   Socioeconomic History  . Marital status: Married    Spouse name: Not on file  . Number of children: 4  . Years of education: Not on file  . Highest education level: Not on file  Occupational History  . Occupation: sales [employer]  Tobacco Use  . Smoking status: Never Smoker  . Smokeless tobacco: Never Used  Vaping Use  . Vaping Use: Never used  Substance and Sexual Activity  . Alcohol use: No  . Drug use: No  . Sexual activity: Yes  Other Topics Concern  . Not on file  Social History Narrative  . Not on file   Social Determinants of Health   Financial Resource Strain: Not on file  Food Insecurity: Not on file  Transportation Needs: Not on file  Physical Activity: Not on file  Stress: Not on file  Social Connections: Not on file  Intimate Partner Violence: Not on file  :  Pertinent items are noted in HPI.  Exam:  General appearance: alert and cooperative appeared without  distress. Head: atraumatic without any abnormalities. Eyes: conjunctivae/corneas clear. PERRL.  Sclera anicteric. Throat: lips, mucosa, and tongue normal; without oral thrush or ulcers. Resp: clear to auscultation bilaterally without rhonchi, wheezes or dullness to percussion. Cardio: regular rate and rhythm, S1, S2 normal, no murmur, click, rub or gallop GI: soft, non-tender; bowel sounds normal; no masses,  no organomegaly Skin: Skin color, texture, turgor normal. No rashes or lesions Lymph nodes: Cervical, supraclavicular, and axillary nodes normal. Neurologic:  Grossly normal without any motor, sensory or deep tendon reflexes. Musculoskeletal: No joint deformity or effusion.    Assessment and Plan:   75 year old man with prostate cancer diagnosed on May 16, 2020.  He was found to have a Gleason score of 4+4 = 8 and a PSA 4.8 indicating high grade localized disease although his staging has not been completed yet.  His case was discussed today in the prostate cancer multidisciplinary clinic including review of his pathology results with the reviewing pathologist as well as revealing of previous MRI with radiology.  Treatment options were discussed at this time with the patient.  Primary surgical therapy versus radiation with androgen deprivation options were reviewed.  Complications as well as risks and benefits of all these approaches were reviewed.  He understands that these treatments are generally equivalent and understanding the side effect profile helps make his decision moving forward.  The role for additional systemic therapy in the form of abiraterone, systemic chemotherapy among others were discussed.  For the time being, these options will be deferred unless he has more advanced disease.  All his questions were answered today to his satisfaction.  45  minutes were dedicated to this visit. The time was spent on reviewing laboratory data, imaging studies, discussing treatment  options,  and answering questions regarding future plan.     A copy of this consult has been forwarded to the requesting physician.

## 2020-06-07 NOTE — Progress Notes (Signed)
[place] Psychosocial Distress Screening Spiritual Care  Met with Corey [name] and his wife Corey [name], who is a retired Marine scientist, in [facility] to introduce [place] team/resources, reviewing distress screen per protocol.  The patient scored a 0 on the Psychosocial Distress Thermometer which indicates minimal distress. Also assessed for distress and other psychosocial needs.   ONCBCN DISTRESS SCREENING 06/07/2020  Screening Type Initial Screening  Distress experienced in past week (1-10) 0  Referral to support programs Yes   [name] reports minimal distress, citing good support and transferable skills from navigating past challenges. Encouraged Prostate Cancer Support Group as an additional layer of support for fellowship and information-sharing.   Follow up needed: No. Couple is aware of ongoing [place] (Patient and [facility]) team/programming availability and plans to reach out as needed/desired.   [place], [place], [facility] Pager [phone] Voicemail [phone]

## 2020-06-07 NOTE — Progress Notes (Signed)
                               Care Plan Summary  Name: Corey [name] DOB: [birth year]   Your Medical Team:   Urologist -  Dr. Raynelle Bring, [facility]  Radiation Oncologist - Dr. Tyler Pita, [facility]   Medical Oncologist - Dr. Zola Button, [place]  Recommendations: 1) PSMA scan ( 06/17/20) 2) ADT (hormone injection) with radiation     * These recommendations are based on information available as of today's consult.      Recommendations may change depending on the results of further tests or exams.   Next Steps: 1)  PET Scan   When appointments need to be scheduled, you will be contacted by [facility] and/or [facility].  Questions?  Please do not hesitate to call Cira Rue, RN, BSN, OCN at [phone] any questions or concerns.  Shirlean Mylar is your Oncology Nurse Navigator and is available to assist you while you're receiving your medical care at [facility].

## 2020-06-08 DIAGNOSIS — C61 Malignant neoplasm of prostate: Secondary | ICD-10-CM | POA: Insufficient documentation

## 2020-06-14 ENCOUNTER — Encounter: Payer: Self-pay | Admitting: Medical Oncology

## 2020-06-17 ENCOUNTER — Other Ambulatory Visit: Payer: Self-pay

## 2020-06-17 ENCOUNTER — Ambulatory Visit (HOSPITAL_COMMUNITY)
Admission: RE | Admit: 2020-06-17 | Discharge: 2020-06-17 | Disposition: A | Discharge status: Home / Self Care | Payer: Medicare HMO | Source: Ambulatory Visit | Attending: Urology | Admitting: Urology

## 2020-06-17 DIAGNOSIS — C61 Malignant neoplasm of prostate: Secondary | ICD-10-CM

## 2020-06-17 MED ORDER — PIFLIFOLASTAT F 18 (PYLARIFY) INJECTION
9.0000 | Freq: Once | INTRAVENOUS | Status: AC
  Administered 2020-06-17: 7.75 via INTRAVENOUS

## 2020-06-21 DIAGNOSIS — M5136 Other intervertebral disc degeneration, lumbar region: Secondary | ICD-10-CM | POA: Diagnosis not present

## 2020-06-22 ENCOUNTER — Telehealth: Payer: Self-pay | Admitting: *Deleted

## 2020-06-22 NOTE — Telephone Encounter (Signed)
CALLED PATIENT TO INFORM OF ADT APPT. FOR 06-24-20 - ARRIVAL TIME- 2:45 PM @ DR. BORDEN'S OFFICE, SPOKE WITH PATIENT AND HE IS AWARE OF THIS APPT.

## 2020-06-24 ENCOUNTER — Encounter: Payer: Self-pay | Admitting: Medical Oncology

## 2020-06-24 ENCOUNTER — Ambulatory Visit: Payer: Medicare HMO | Admitting: Radiation Oncology

## 2020-06-24 DIAGNOSIS — C61 Malignant neoplasm of prostate: Secondary | ICD-10-CM | POA: Diagnosis not present

## 2020-06-24 DIAGNOSIS — Z5111 Encounter for antineoplastic chemotherapy: Secondary | ICD-10-CM | POA: Diagnosis not present

## 2020-07-11 ENCOUNTER — Telehealth: Payer: Self-pay | Admitting: *Deleted

## 2020-07-11 NOTE — Telephone Encounter (Signed)
CALLED PATIENT TO INFORM OF 08-12-20- ARRIVAL TIME - 9:45 AM FOR SIM, SPOKE WITH PATIENT AND HE IS AWARE OF THIS APPT.

## 2020-07-28 ENCOUNTER — Other Ambulatory Visit: Payer: Self-pay | Admitting: Cardiology

## 2020-08-10 DIAGNOSIS — C61 Malignant neoplasm of prostate: Secondary | ICD-10-CM | POA: Diagnosis not present

## 2020-08-11 ENCOUNTER — Telehealth: Payer: Self-pay | Admitting: *Deleted

## 2020-08-11 NOTE — Telephone Encounter (Signed)
Called patient to remind of sim appt. for 08-12-20 - arrival time- 10:15 am @ [facility], spoke with patient and he is aware of this appt.

## 2020-08-12 ENCOUNTER — Encounter: Payer: Self-pay | Admitting: Medical Oncology

## 2020-08-12 ENCOUNTER — Ambulatory Visit
Admission: RE | Admit: 2020-08-12 | Discharge: 2020-08-12 | Disposition: A | Discharge status: Home / Self Care | Payer: Medicare HMO | Source: Ambulatory Visit | Attending: Radiation Oncology | Admitting: Radiation Oncology

## 2020-08-12 DIAGNOSIS — C61 Malignant neoplasm of prostate: Secondary | ICD-10-CM | POA: Diagnosis not present

## 2020-08-12 NOTE — Progress Notes (Signed)
  Radiation Oncology         (336) [phone] ________________________________  Name: Corey [name] MRN: [id number]  Date: 08/12/2020  DOB: 06/13/1945  SIMULATION AND TREATMENT PLANNING NOTE    ICD-10-CM   1. Malignant neoplasm of prostate ([place])  C61     DIAGNOSIS:  75 y.o. gentleman with stage T2a adenocarcinoma of the prostate with a Gleason's score of 4+4 and a PSA of 4.8.  NARRATIVE:  The patient was brought to the [place].  Identity was confirmed.  All relevant records and images related to the planned course of therapy were reviewed.  The patient freely provided informed written consent to proceed with treatment after reviewing the details related to the planned course of therapy. The consent form was witnessed and verified by the simulation staff.  Then, the patient was set-up in a stable reproducible supine position for radiation therapy.  A vacuum lock pillow device was custom fabricated to position his legs in a reproducible immobilized position.  Then, I performed a urethrogram under sterile conditions to identify the prostatic apex.  CT images were obtained.  Surface markings were placed.  The CT images were loaded into the planning software.  Then the prostate target and avoidance structures including the rectum, bladder, bowel and hips were contoured.  Treatment planning then occurred.  The radiation prescription was entered and confirmed.  A total of one complex treatment devices was fabricated. I have requested : Intensity Modulated Radiotherapy (IMRT) is medically necessary for this case for the following reason:  Rectal sparing.Marland Kitchen  PLAN:  The prostate, seminal vesicles, and pelvic lymph nodes will initially recieve 45 Gy in 25 fractions of 1.8 Gy followed by a boost to the prostate only, to 75 Gy with 15 additional fractions of 2.0 Gy.  ________________________________  Sheral Apley Tammi Klippel, M.D.

## 2020-08-17 DIAGNOSIS — C61 Malignant neoplasm of prostate: Secondary | ICD-10-CM | POA: Insufficient documentation

## 2020-08-18 DIAGNOSIS — C61 Malignant neoplasm of prostate: Secondary | ICD-10-CM | POA: Diagnosis not present

## 2020-08-24 ENCOUNTER — Ambulatory Visit
Admission: RE | Admit: 2020-08-24 | Discharge: 2020-08-24 | Disposition: A | Discharge status: Home / Self Care | Payer: Medicare HMO | Source: Ambulatory Visit | Attending: Radiation Oncology | Admitting: Radiation Oncology

## 2020-08-24 ENCOUNTER — Other Ambulatory Visit: Payer: Self-pay

## 2020-08-24 DIAGNOSIS — C61 Malignant neoplasm of prostate: Secondary | ICD-10-CM | POA: Diagnosis not present

## 2020-08-25 ENCOUNTER — Ambulatory Visit
Admission: RE | Admit: 2020-08-25 | Discharge: 2020-08-25 | Disposition: A | Discharge status: Home / Self Care | Payer: Medicare HMO | Source: Ambulatory Visit | Attending: Radiation Oncology | Admitting: Radiation Oncology

## 2020-08-25 DIAGNOSIS — C61 Malignant neoplasm of prostate: Secondary | ICD-10-CM | POA: Diagnosis not present

## 2020-08-25 NOTE — Progress Notes (Signed)
Pt here for patient teaching.  Pt given Radiation and You booklet.  Reviewed areas of pertinence such as diarrhea, fatigue, sexual and fertility changes, skin changes, and urinary and bladder changes . Pt able to give teach back of have Imodium on hand and drink plenty of water,avoid applying anything to skin within 4 hours of treatment. Pt verbalizes understanding of information given and will contact nursing with any questions or concerns.     [email or web address]/whattoexpect/index

## 2020-08-26 ENCOUNTER — Ambulatory Visit
Admission: RE | Admit: 2020-08-26 | Discharge: 2020-08-26 | Disposition: A | Discharge status: Home / Self Care | Payer: Medicare HMO | Source: Ambulatory Visit | Attending: Radiation Oncology | Admitting: Radiation Oncology

## 2020-08-26 DIAGNOSIS — C61 Malignant neoplasm of prostate: Secondary | ICD-10-CM | POA: Diagnosis not present

## 2020-08-29 ENCOUNTER — Ambulatory Visit
Admission: RE | Admit: 2020-08-29 | Discharge: 2020-08-29 | Disposition: A | Discharge status: Home / Self Care | Payer: Medicare HMO | Source: Ambulatory Visit | Attending: Radiation Oncology | Admitting: Radiation Oncology

## 2020-08-29 DIAGNOSIS — C61 Malignant neoplasm of prostate: Secondary | ICD-10-CM | POA: Diagnosis not present

## 2020-08-30 ENCOUNTER — Ambulatory Visit
Admission: RE | Admit: 2020-08-30 | Discharge: 2020-08-30 | Disposition: A | Discharge status: Home / Self Care | Payer: Medicare HMO | Source: Ambulatory Visit | Attending: Radiation Oncology | Admitting: Radiation Oncology

## 2020-08-30 ENCOUNTER — Other Ambulatory Visit: Payer: Self-pay

## 2020-08-30 DIAGNOSIS — C61 Malignant neoplasm of prostate: Secondary | ICD-10-CM | POA: Diagnosis not present

## 2020-08-31 ENCOUNTER — Ambulatory Visit
Admission: RE | Admit: 2020-08-31 | Discharge: 2020-08-31 | Disposition: A | Discharge status: Home / Self Care | Payer: Medicare HMO | Source: Ambulatory Visit | Attending: Radiation Oncology | Admitting: Radiation Oncology

## 2020-08-31 DIAGNOSIS — C61 Malignant neoplasm of prostate: Secondary | ICD-10-CM | POA: Diagnosis not present

## 2020-09-01 ENCOUNTER — Other Ambulatory Visit: Payer: Self-pay

## 2020-09-01 ENCOUNTER — Ambulatory Visit
Admission: RE | Admit: 2020-09-01 | Discharge: 2020-09-01 | Disposition: A | Discharge status: Home / Self Care | Payer: Medicare HMO | Source: Ambulatory Visit | Attending: Radiation Oncology | Admitting: Radiation Oncology

## 2020-09-01 DIAGNOSIS — C61 Malignant neoplasm of prostate: Secondary | ICD-10-CM | POA: Diagnosis not present

## 2020-09-02 ENCOUNTER — Ambulatory Visit
Admission: RE | Admit: 2020-09-02 | Discharge: 2020-09-02 | Disposition: A | Discharge status: Home / Self Care | Payer: Medicare HMO | Source: Ambulatory Visit | Attending: Radiation Oncology | Admitting: Radiation Oncology

## 2020-09-02 DIAGNOSIS — C61 Malignant neoplasm of prostate: Secondary | ICD-10-CM | POA: Diagnosis not present

## 2020-09-05 ENCOUNTER — Ambulatory Visit
Admission: RE | Admit: 2020-09-05 | Discharge: 2020-09-05 | Disposition: A | Discharge status: Home / Self Care | Payer: Medicare HMO | Source: Ambulatory Visit | Attending: Radiation Oncology | Admitting: Radiation Oncology

## 2020-09-05 ENCOUNTER — Other Ambulatory Visit: Payer: Self-pay

## 2020-09-05 DIAGNOSIS — C61 Malignant neoplasm of prostate: Secondary | ICD-10-CM | POA: Diagnosis not present

## 2020-09-06 ENCOUNTER — Other Ambulatory Visit: Payer: Self-pay

## 2020-09-06 ENCOUNTER — Ambulatory Visit
Admission: RE | Admit: 2020-09-06 | Discharge: 2020-09-06 | Disposition: A | Discharge status: Home / Self Care | Payer: Medicare HMO | Source: Ambulatory Visit | Attending: Radiation Oncology | Admitting: Radiation Oncology

## 2020-09-06 DIAGNOSIS — C61 Malignant neoplasm of prostate: Secondary | ICD-10-CM | POA: Diagnosis not present

## 2020-09-07 ENCOUNTER — Ambulatory Visit
Admission: RE | Admit: 2020-09-07 | Discharge: 2020-09-07 | Disposition: A | Discharge status: Home / Self Care | Payer: Medicare HMO | Source: Ambulatory Visit | Attending: Radiation Oncology | Admitting: Radiation Oncology

## 2020-09-07 DIAGNOSIS — C61 Malignant neoplasm of prostate: Secondary | ICD-10-CM | POA: Diagnosis not present

## 2020-09-08 ENCOUNTER — Ambulatory Visit
Admission: RE | Admit: 2020-09-08 | Discharge: 2020-09-08 | Disposition: A | Discharge status: Home / Self Care | Payer: Medicare HMO | Source: Ambulatory Visit | Attending: Radiation Oncology | Admitting: Radiation Oncology

## 2020-09-08 DIAGNOSIS — C61 Malignant neoplasm of prostate: Secondary | ICD-10-CM | POA: Diagnosis not present

## 2020-09-09 ENCOUNTER — Ambulatory Visit
Admission: RE | Admit: 2020-09-09 | Discharge: 2020-09-09 | Disposition: A | Discharge status: Home / Self Care | Payer: Medicare HMO | Source: Ambulatory Visit | Attending: Radiation Oncology | Admitting: Radiation Oncology

## 2020-09-09 DIAGNOSIS — C61 Malignant neoplasm of prostate: Secondary | ICD-10-CM | POA: Diagnosis not present

## 2020-09-12 ENCOUNTER — Ambulatory Visit
Admission: RE | Admit: 2020-09-12 | Discharge: 2020-09-12 | Disposition: A | Discharge status: Home / Self Care | Payer: Medicare HMO | Source: Ambulatory Visit | Attending: Radiation Oncology | Admitting: Radiation Oncology

## 2020-09-12 DIAGNOSIS — C61 Malignant neoplasm of prostate: Secondary | ICD-10-CM | POA: Diagnosis not present

## 2020-09-13 ENCOUNTER — Other Ambulatory Visit: Payer: Self-pay

## 2020-09-13 ENCOUNTER — Ambulatory Visit
Admission: RE | Admit: 2020-09-13 | Discharge: 2020-09-13 | Disposition: A | Discharge status: Home / Self Care | Payer: Medicare HMO | Source: Ambulatory Visit | Attending: Radiation Oncology | Admitting: Radiation Oncology

## 2020-09-13 DIAGNOSIS — C61 Malignant neoplasm of prostate: Secondary | ICD-10-CM | POA: Diagnosis not present

## 2020-09-14 ENCOUNTER — Ambulatory Visit
Admission: RE | Admit: 2020-09-14 | Discharge: 2020-09-14 | Disposition: A | Discharge status: Home / Self Care | Payer: Medicare HMO | Source: Ambulatory Visit | Attending: Radiation Oncology | Admitting: Radiation Oncology

## 2020-09-14 ENCOUNTER — Other Ambulatory Visit: Payer: Self-pay

## 2020-09-14 DIAGNOSIS — C61 Malignant neoplasm of prostate: Secondary | ICD-10-CM | POA: Diagnosis not present

## 2020-09-15 ENCOUNTER — Ambulatory Visit
Admission: RE | Admit: 2020-09-15 | Discharge: 2020-09-15 | Disposition: A | Discharge status: Home / Self Care | Payer: Medicare HMO | Source: Ambulatory Visit | Attending: Radiation Oncology | Admitting: Radiation Oncology

## 2020-09-15 DIAGNOSIS — C61 Malignant neoplasm of prostate: Secondary | ICD-10-CM | POA: Diagnosis not present

## 2020-09-16 ENCOUNTER — Ambulatory Visit
Admission: RE | Admit: 2020-09-16 | Discharge: 2020-09-16 | Disposition: A | Discharge status: Home / Self Care | Payer: Medicare HMO | Source: Ambulatory Visit | Attending: Radiation Oncology | Admitting: Radiation Oncology

## 2020-09-16 DIAGNOSIS — C61 Malignant neoplasm of prostate: Secondary | ICD-10-CM | POA: Insufficient documentation

## 2020-09-20 ENCOUNTER — Ambulatory Visit
Admission: RE | Admit: 2020-09-20 | Discharge: 2020-09-20 | Disposition: A | Discharge status: Home / Self Care | Payer: Medicare HMO | Source: Ambulatory Visit | Attending: Radiation Oncology | Admitting: Radiation Oncology

## 2020-09-20 ENCOUNTER — Other Ambulatory Visit: Payer: Self-pay

## 2020-09-20 DIAGNOSIS — C61 Malignant neoplasm of prostate: Secondary | ICD-10-CM | POA: Diagnosis not present

## 2020-09-21 ENCOUNTER — Ambulatory Visit
Admission: RE | Admit: 2020-09-21 | Discharge: 2020-09-21 | Disposition: A | Discharge status: Home / Self Care | Payer: Medicare HMO | Source: Ambulatory Visit | Attending: Radiation Oncology | Admitting: Radiation Oncology

## 2020-09-21 DIAGNOSIS — C61 Malignant neoplasm of prostate: Secondary | ICD-10-CM | POA: Diagnosis not present

## 2020-09-22 ENCOUNTER — Ambulatory Visit
Admission: RE | Admit: 2020-09-22 | Discharge: 2020-09-22 | Disposition: A | Discharge status: Home / Self Care | Payer: Medicare HMO | Source: Ambulatory Visit | Attending: Radiation Oncology | Admitting: Radiation Oncology

## 2020-09-22 ENCOUNTER — Other Ambulatory Visit: Payer: Self-pay

## 2020-09-22 DIAGNOSIS — C61 Malignant neoplasm of prostate: Secondary | ICD-10-CM | POA: Diagnosis not present

## 2020-09-23 ENCOUNTER — Ambulatory Visit
Admission: RE | Admit: 2020-09-23 | Discharge: 2020-09-23 | Disposition: A | Discharge status: Home / Self Care | Payer: Medicare HMO | Source: Ambulatory Visit | Attending: Radiation Oncology | Admitting: Radiation Oncology

## 2020-09-23 DIAGNOSIS — C61 Malignant neoplasm of prostate: Secondary | ICD-10-CM | POA: Diagnosis not present

## 2020-09-26 ENCOUNTER — Ambulatory Visit
Admission: RE | Admit: 2020-09-26 | Discharge: 2020-09-26 | Disposition: A | Discharge status: Home / Self Care | Payer: Medicare HMO | Source: Ambulatory Visit | Attending: Radiation Oncology | Admitting: Radiation Oncology

## 2020-09-26 ENCOUNTER — Other Ambulatory Visit: Payer: Self-pay

## 2020-09-26 DIAGNOSIS — C61 Malignant neoplasm of prostate: Secondary | ICD-10-CM | POA: Diagnosis not present

## 2020-09-27 ENCOUNTER — Ambulatory Visit: Payer: Medicare HMO

## 2020-09-28 ENCOUNTER — Ambulatory Visit
Admission: RE | Admit: 2020-09-28 | Discharge: 2020-09-28 | Disposition: A | Discharge status: Home / Self Care | Payer: Medicare HMO | Source: Ambulatory Visit | Attending: Radiation Oncology | Admitting: Radiation Oncology

## 2020-09-28 DIAGNOSIS — C61 Malignant neoplasm of prostate: Secondary | ICD-10-CM | POA: Diagnosis not present

## 2020-09-29 ENCOUNTER — Ambulatory Visit: Payer: Medicare HMO

## 2020-09-30 ENCOUNTER — Other Ambulatory Visit: Payer: Self-pay

## 2020-09-30 ENCOUNTER — Ambulatory Visit
Admission: RE | Admit: 2020-09-30 | Discharge: 2020-09-30 | Disposition: A | Discharge status: Home / Self Care | Payer: Medicare HMO | Source: Ambulatory Visit | Attending: Radiation Oncology | Admitting: Radiation Oncology

## 2020-09-30 DIAGNOSIS — C61 Malignant neoplasm of prostate: Secondary | ICD-10-CM | POA: Diagnosis not present

## 2020-10-03 ENCOUNTER — Other Ambulatory Visit: Payer: Self-pay

## 2020-10-03 ENCOUNTER — Ambulatory Visit
Admission: RE | Admit: 2020-10-03 | Discharge: 2020-10-03 | Disposition: A | Discharge status: Home / Self Care | Payer: Medicare HMO | Source: Ambulatory Visit | Attending: Radiation Oncology | Admitting: Radiation Oncology

## 2020-10-03 DIAGNOSIS — C61 Malignant neoplasm of prostate: Secondary | ICD-10-CM | POA: Diagnosis not present

## 2020-10-04 ENCOUNTER — Other Ambulatory Visit: Payer: Self-pay

## 2020-10-04 ENCOUNTER — Ambulatory Visit
Admission: RE | Admit: 2020-10-04 | Discharge: 2020-10-04 | Disposition: A | Discharge status: Home / Self Care | Payer: Medicare HMO | Source: Ambulatory Visit | Attending: Radiation Oncology | Admitting: Radiation Oncology

## 2020-10-04 DIAGNOSIS — C61 Malignant neoplasm of prostate: Secondary | ICD-10-CM | POA: Diagnosis not present

## 2020-10-05 ENCOUNTER — Other Ambulatory Visit: Payer: Self-pay

## 2020-10-05 ENCOUNTER — Ambulatory Visit
Admission: RE | Admit: 2020-10-05 | Discharge: 2020-10-05 | Disposition: A | Discharge status: Home / Self Care | Payer: Medicare HMO | Source: Ambulatory Visit | Attending: Radiation Oncology | Admitting: Radiation Oncology

## 2020-10-05 DIAGNOSIS — Z85820 Personal history of malignant melanoma of skin: Secondary | ICD-10-CM | POA: Diagnosis not present

## 2020-10-05 DIAGNOSIS — C61 Malignant neoplasm of prostate: Secondary | ICD-10-CM | POA: Diagnosis not present

## 2020-10-05 DIAGNOSIS — L821 Other seborrheic keratosis: Secondary | ICD-10-CM | POA: Diagnosis not present

## 2020-10-05 DIAGNOSIS — D2262 Melanocytic nevi of left upper limb, including shoulder: Secondary | ICD-10-CM | POA: Diagnosis not present

## 2020-10-05 DIAGNOSIS — D2261 Melanocytic nevi of right upper limb, including shoulder: Secondary | ICD-10-CM | POA: Diagnosis not present

## 2020-10-05 DIAGNOSIS — L814 Other melanin hyperpigmentation: Secondary | ICD-10-CM | POA: Diagnosis not present

## 2020-10-05 DIAGNOSIS — D1801 Hemangioma of skin and subcutaneous tissue: Secondary | ICD-10-CM | POA: Diagnosis not present

## 2020-10-05 DIAGNOSIS — D225 Melanocytic nevi of trunk: Secondary | ICD-10-CM | POA: Diagnosis not present

## 2020-10-06 ENCOUNTER — Other Ambulatory Visit: Payer: Self-pay

## 2020-10-06 ENCOUNTER — Ambulatory Visit
Admission: RE | Admit: 2020-10-06 | Discharge: 2020-10-06 | Disposition: A | Discharge status: Home / Self Care | Payer: Medicare HMO | Source: Ambulatory Visit | Attending: Radiation Oncology | Admitting: Radiation Oncology

## 2020-10-06 DIAGNOSIS — C61 Malignant neoplasm of prostate: Secondary | ICD-10-CM | POA: Diagnosis not present

## 2020-10-07 ENCOUNTER — Ambulatory Visit
Admission: RE | Admit: 2020-10-07 | Discharge: 2020-10-07 | Disposition: A | Discharge status: Home / Self Care | Payer: Medicare HMO | Source: Ambulatory Visit | Attending: Radiation Oncology | Admitting: Radiation Oncology

## 2020-10-07 DIAGNOSIS — C61 Malignant neoplasm of prostate: Secondary | ICD-10-CM | POA: Diagnosis not present

## 2020-10-10 ENCOUNTER — Ambulatory Visit
Admission: RE | Admit: 2020-10-10 | Discharge: 2020-10-10 | Disposition: A | Discharge status: Home / Self Care | Payer: Medicare HMO | Source: Ambulatory Visit | Attending: Radiation Oncology | Admitting: Radiation Oncology

## 2020-10-10 DIAGNOSIS — C61 Malignant neoplasm of prostate: Secondary | ICD-10-CM | POA: Diagnosis not present

## 2020-10-11 ENCOUNTER — Ambulatory Visit
Admission: RE | Admit: 2020-10-11 | Discharge: 2020-10-11 | Disposition: A | Discharge status: Home / Self Care | Payer: Medicare HMO | Source: Ambulatory Visit | Attending: Radiation Oncology | Admitting: Radiation Oncology

## 2020-10-11 ENCOUNTER — Other Ambulatory Visit: Payer: Self-pay

## 2020-10-11 DIAGNOSIS — C61 Malignant neoplasm of prostate: Secondary | ICD-10-CM | POA: Diagnosis not present

## 2020-10-12 ENCOUNTER — Ambulatory Visit
Admission: RE | Admit: 2020-10-12 | Discharge: 2020-10-12 | Disposition: A | Discharge status: Home / Self Care | Payer: Medicare HMO | Source: Ambulatory Visit | Attending: Radiation Oncology | Admitting: Radiation Oncology

## 2020-10-12 DIAGNOSIS — C61 Malignant neoplasm of prostate: Secondary | ICD-10-CM | POA: Diagnosis not present

## 2020-10-13 ENCOUNTER — Other Ambulatory Visit: Payer: Self-pay

## 2020-10-13 ENCOUNTER — Ambulatory Visit
Admission: RE | Admit: 2020-10-13 | Discharge: 2020-10-13 | Disposition: A | Discharge status: Home / Self Care | Payer: Medicare HMO | Source: Ambulatory Visit | Attending: Radiation Oncology | Admitting: Radiation Oncology

## 2020-10-13 DIAGNOSIS — C61 Malignant neoplasm of prostate: Secondary | ICD-10-CM | POA: Diagnosis not present

## 2020-10-14 ENCOUNTER — Other Ambulatory Visit: Payer: Self-pay

## 2020-10-14 ENCOUNTER — Other Ambulatory Visit: Payer: Self-pay | Admitting: Radiation Oncology

## 2020-10-14 ENCOUNTER — Ambulatory Visit
Admission: RE | Admit: 2020-10-14 | Discharge: 2020-10-14 | Disposition: A | Discharge status: Home / Self Care | Payer: Medicare HMO | Source: Ambulatory Visit | Attending: Radiation Oncology | Admitting: Radiation Oncology

## 2020-10-14 DIAGNOSIS — C61 Malignant neoplasm of prostate: Secondary | ICD-10-CM | POA: Diagnosis not present

## 2020-10-14 MED ORDER — PHENAZOPYRIDINE HCL 200 MG PO TABS: 200.0000 mg | ORAL_TABLET | Freq: Three times a day (TID) | ORAL | 5 refills | Status: AC | PRN

## 2020-10-17 ENCOUNTER — Other Ambulatory Visit: Payer: Self-pay

## 2020-10-17 ENCOUNTER — Ambulatory Visit
Admission: RE | Admit: 2020-10-17 | Discharge: 2020-10-17 | Disposition: A | Discharge status: Home / Self Care | Payer: Medicare HMO | Source: Ambulatory Visit | Attending: Radiation Oncology | Admitting: Radiation Oncology

## 2020-10-17 DIAGNOSIS — C61 Malignant neoplasm of prostate: Secondary | ICD-10-CM | POA: Insufficient documentation

## 2020-10-17 DIAGNOSIS — Z510 Encounter for antineoplastic radiation therapy: Secondary | ICD-10-CM | POA: Insufficient documentation

## 2020-10-18 ENCOUNTER — Ambulatory Visit
Admission: RE | Admit: 2020-10-18 | Discharge: 2020-10-18 | Disposition: A | Discharge status: Home / Self Care | Payer: Medicare HMO | Source: Ambulatory Visit | Attending: Radiation Oncology | Admitting: Radiation Oncology

## 2020-10-18 DIAGNOSIS — C61 Malignant neoplasm of prostate: Secondary | ICD-10-CM | POA: Diagnosis not present

## 2020-10-18 DIAGNOSIS — Z510 Encounter for antineoplastic radiation therapy: Secondary | ICD-10-CM | POA: Diagnosis not present

## 2020-10-19 ENCOUNTER — Ambulatory Visit: Payer: Medicare HMO

## 2020-10-19 ENCOUNTER — Ambulatory Visit
Admission: RE | Admit: 2020-10-19 | Discharge: 2020-10-19 | Disposition: A | Discharge status: Home / Self Care | Payer: Medicare HMO | Source: Ambulatory Visit | Attending: Radiation Oncology | Admitting: Radiation Oncology

## 2020-10-19 DIAGNOSIS — Z510 Encounter for antineoplastic radiation therapy: Secondary | ICD-10-CM | POA: Diagnosis not present

## 2020-10-19 DIAGNOSIS — C61 Malignant neoplasm of prostate: Secondary | ICD-10-CM | POA: Diagnosis not present

## 2020-10-20 ENCOUNTER — Ambulatory Visit
Admission: RE | Admit: 2020-10-20 | Discharge: 2020-10-20 | Disposition: A | Discharge status: Home / Self Care | Payer: Medicare HMO | Source: Ambulatory Visit | Attending: Radiation Oncology | Admitting: Radiation Oncology

## 2020-10-20 ENCOUNTER — Ambulatory Visit: Payer: Medicare HMO

## 2020-10-20 DIAGNOSIS — C61 Malignant neoplasm of prostate: Secondary | ICD-10-CM | POA: Diagnosis not present

## 2020-10-20 DIAGNOSIS — Z510 Encounter for antineoplastic radiation therapy: Secondary | ICD-10-CM | POA: Diagnosis not present

## 2020-10-21 ENCOUNTER — Encounter: Payer: Self-pay | Admitting: Urology

## 2020-10-21 ENCOUNTER — Ambulatory Visit
Admission: RE | Admit: 2020-10-21 | Discharge: 2020-10-21 | Disposition: A | Discharge status: Home / Self Care | Payer: Medicare HMO | Source: Ambulatory Visit | Attending: Radiation Oncology | Admitting: Radiation Oncology

## 2020-10-21 DIAGNOSIS — C61 Malignant neoplasm of prostate: Secondary | ICD-10-CM | POA: Diagnosis not present

## 2020-10-21 DIAGNOSIS — Z510 Encounter for antineoplastic radiation therapy: Secondary | ICD-10-CM | POA: Diagnosis not present

## 2020-11-21 ENCOUNTER — Other Ambulatory Visit: Payer: Self-pay | Admitting: Cardiology

## 2020-12-12 ENCOUNTER — Encounter: Payer: Self-pay | Admitting: Urology

## 2020-12-12 NOTE — Progress Notes (Addendum)
Patient reports doing well. No symptoms reported at this time.  I-PSS Score of 0.  Meaningful use complete.  Patient notified of 8:30am-12/14/20 telephone appointment and understands.

## 2020-12-14 ENCOUNTER — Ambulatory Visit
Admission: RE | Admit: 2020-12-14 | Discharge: 2020-12-14 | Disposition: A | Discharge status: Home / Self Care | Payer: Medicare HMO | Source: Ambulatory Visit | Attending: Radiation Oncology | Admitting: Radiation Oncology

## 2020-12-14 DIAGNOSIS — C61 Malignant neoplasm of prostate: Secondary | ICD-10-CM

## 2020-12-14 NOTE — Progress Notes (Signed)
  Radiation Oncology         (336) [phone] ________________________________  Name: Corey [name] MRN: [id number]  Date: 10/21/2020  DOB: 1945/10/22  End of Treatment Note  Diagnosis:   75 y.o. gentleman with stage T2a adenocarcinoma of the prostate with a Gleason's score of 4+4 and a PSA of 4.8.     Indication for treatment:  Curative, Definitive Radiotherapy       Radiation treatment dates:   08/24/20 - 10/21/20  Site/dose:  1. The prostate, seminal vesicles, and pelvic lymph nodes were initially treated to 45 Gy in 25 fractions of 1.8 Gy  2. The prostate only was boosted to 75 Gy with 15 additional fractions of 2.0 Gy   Beams/energy:  1. The prostate, seminal vesicles, and pelvic lymph nodes were initially treated using VMAT intensity modulated radiotherapy delivering 6 megavolt photons. Image guidance was performed with CB-CT studies prior to each fraction. He was immobilized with a body fix lower extremity mold.  2. the prostate only was boosted using VMAT intensity modulated radiotherapy delivering 6 megavolt photons. Image guidance was performed with CB-CT studies prior to each fraction. He was immobilized with a body fix lower extremity mold.  Narrative: The patient tolerated radiation treatment relatively well with only minor urinary irritation and modest fatigue.  He reported mild dysuria at the start of his stream with increased frequency, urgency and nocturia 6-7 times per night.  He continued taking Flomax twice daily throughout treatment.  He also experienced diarrhea which was improved with Imodium as needed.  Plan: The patient has completed radiation treatment. He will return to radiation oncology clinic for routine followup in one month. I advised him to call or return sooner if he has any questions or concerns related to his recovery or treatment. ________________________________  Sheral Apley. Tammi Klippel, M.D.

## 2020-12-14 NOTE — Progress Notes (Signed)
Radiation Oncology         (336) [phone] ________________________________  Name: ALICE VITELLI MRN: [id number]  Date: 12/14/2020  DOB: Jul 26, 1945  Post Treatment Note  CC: Leanna Battles, MD  Raynelle Bring, MD  Diagnosis:   75 y.o. gentleman with stage T2a adenocarcinoma of the prostate with a Gleason's score of 4+4 and a PSA of 4.8.  Interval Since Last Radiation:  7.5 weeks (concurrent with LT-ADT- started 06/24/20) 08/24/20 - 10/21/20: 1. The prostate, seminal vesicles, and pelvic lymph nodes were initially treated to 45 Gy in 25 fractions of 1.8 Gy  2. The prostate only was boosted to 75 Gy with 15 additional fractions of 2.0 Gy   Narrative:  I spoke with the patient to conduct his routine scheduled 1 month follow up visit via telephone to spare the patient unnecessary potential exposure in the healthcare setting during the current COVID-19 pandemic.  The patient was notified in advance and gave permission to proceed with this visit format.  He tolerated radiation treatment relatively well with only minor urinary irritation and modest fatigue.  He reported mild dysuria at the start of his stream with increased frequency, urgency and nocturia 6-7 times per night.  He continued taking Flomax twice daily throughout treatment.  He also experienced diarrhea which was improved with Imodium as needed.                              On review of systems, the patient states that he is doing well in general and is currently without complaints.  He reports gradual improvement in his LUTS and at this point feels like he is basically back to his baseline aside from some mild residual urgency.  Current IPSS score is 0.  He specifically denies dysuria, gross hematuria, straining to void, excessive daytime frequency, incomplete bladder emptying or incontinence.  He denies abdominal pain, nausea, vomiting, diarrhea or constipation.  He reports a healthy appetite and is maintaining his weight.  His energy level  is gradually returning as well and he is back to his regular daily activities and exercising at the [facility].  Overall, he is quite pleased with his progress to date.  ALLERGIES:  is allergic to tramadol.  Meds: Current Outpatient Medications  Medication Sig Dispense Refill   amLODipine (NORVASC) 5 MG tablet TAKE 1 TABLET BY MOUTH EVERY DAY 90 tablet 1   aspirin EC 81 MG tablet Take 81 mg by mouth daily.      losartan (COZAAR) 100 MG tablet TAKE 1 TABLET (100 MG TOTAL) BY MOUTH DAILY. PATIENT NEEDS OV FOR FUTURE REFILLS 90 tablet 1   potassium citrate (UROCIT-K) 10 MEQ (1080 MG) SR tablet Take 20 mEq by mouth 2 (two) times daily.     pravastatin (PRAVACHOL) 40 MG tablet Take 40 mg by mouth at bedtime.   3   tamsulosin (FLOMAX) 0.4 MG CAPS capsule Take 0.8 mg by mouth at bedtime.   11   No current facility-administered medications for this encounter.    Physical Findings:  vitals were not taken for this visit.  Pain Assessment Pain Score: 0-No pain/10 Unable to assess due to telephone follow-up visit format.  Lab Findings: Lab Results  Component Value Date   WBC 5.0 02/08/2020   HGB 15.2 02/08/2020   HCT 43.9 02/08/2020   MCV 93.8 02/08/2020   PLT 181 02/08/2020     Radiographic Findings: No results found.  Impression/Plan: 1. 75  y.o. gentleman with stage T2a adenocarcinoma of the prostate with a Gleason's score of 4+4 and a PSA of 4.8. He will continue to follow up with urology for ongoing PSA determinations and has an appointment scheduled for labs on 12/23/20 and will see Dr. Alinda Money on 12/29/20. He understands what to expect with regards to PSA monitoring going forward. I will look forward to following his response to treatment via correspondence with urology, and would be happy to continue to participate in his care if clinically indicated. I talked to the patient about what to expect in the future, including his risk for erectile dysfunction and rectal bleeding. I encouraged him  to call or return to the office if he has any questions regarding his previous radiation or possible radiation side effects. He was comfortable with this plan and will follow up as needed.     Nicholos Johns, PA-C

## 2020-12-19 ENCOUNTER — Other Ambulatory Visit: Payer: Self-pay | Admitting: Cardiology

## 2020-12-20 DIAGNOSIS — M48062 Spinal stenosis, lumbar region with neurogenic claudication: Secondary | ICD-10-CM | POA: Diagnosis not present

## 2020-12-20 DIAGNOSIS — Z981 Arthrodesis status: Secondary | ICD-10-CM | POA: Diagnosis not present

## 2020-12-21 ENCOUNTER — Other Ambulatory Visit: Payer: Self-pay | Admitting: Urology

## 2020-12-21 DIAGNOSIS — C61 Malignant neoplasm of prostate: Secondary | ICD-10-CM

## 2020-12-23 DIAGNOSIS — C61 Malignant neoplasm of prostate: Secondary | ICD-10-CM | POA: Diagnosis not present

## 2020-12-27 ENCOUNTER — Telehealth: Payer: Self-pay | Admitting: Adult Health

## 2020-12-27 NOTE — Telephone Encounter (Signed)
Received referral to reach out to patient and schedule SCP visit.  I called and LMOM to review with him and asked him to call me back.    Wilber Bihari, NP

## 2020-12-30 DIAGNOSIS — C61 Malignant neoplasm of prostate: Secondary | ICD-10-CM | POA: Diagnosis not present

## 2021-01-27 DIAGNOSIS — S32009K Unspecified fracture of unspecified lumbar vertebra, subsequent encounter for fracture with nonunion: Secondary | ICD-10-CM | POA: Diagnosis not present

## 2021-01-27 DIAGNOSIS — M545 Low back pain, unspecified: Secondary | ICD-10-CM | POA: Diagnosis not present

## 2021-02-14 ENCOUNTER — Other Ambulatory Visit: Payer: Self-pay

## 2021-02-14 ENCOUNTER — Emergency Department (HOSPITAL_BASED_OUTPATIENT_CLINIC_OR_DEPARTMENT_OTHER)
Admission: EM | Admit: 2021-02-14 | Discharge: 2021-02-14 | Disposition: A | Discharge status: Home / Self Care | Payer: Medicare HMO | Attending: Emergency Medicine | Admitting: Emergency Medicine

## 2021-02-14 ENCOUNTER — Emergency Department (HOSPITAL_BASED_OUTPATIENT_CLINIC_OR_DEPARTMENT_OTHER): Payer: Medicare HMO | Admitting: Radiology

## 2021-02-14 ENCOUNTER — Encounter (HOSPITAL_BASED_OUTPATIENT_CLINIC_OR_DEPARTMENT_OTHER): Payer: Self-pay | Admitting: Emergency Medicine

## 2021-02-14 DIAGNOSIS — Z7982 Long term (current) use of aspirin: Secondary | ICD-10-CM | POA: Diagnosis not present

## 2021-02-14 DIAGNOSIS — N183 Chronic kidney disease, stage 3 unspecified: Secondary | ICD-10-CM | POA: Insufficient documentation

## 2021-02-14 DIAGNOSIS — Z8546 Personal history of malignant neoplasm of prostate: Secondary | ICD-10-CM | POA: Diagnosis not present

## 2021-02-14 DIAGNOSIS — Z96653 Presence of artificial knee joint, bilateral: Secondary | ICD-10-CM | POA: Diagnosis not present

## 2021-02-14 DIAGNOSIS — R072 Precordial pain: Secondary | ICD-10-CM | POA: Diagnosis not present

## 2021-02-14 DIAGNOSIS — I129 Hypertensive chronic kidney disease with stage 1 through stage 4 chronic kidney disease, or unspecified chronic kidney disease: Secondary | ICD-10-CM | POA: Insufficient documentation

## 2021-02-14 DIAGNOSIS — R079 Chest pain, unspecified: Secondary | ICD-10-CM | POA: Diagnosis not present

## 2021-02-14 DIAGNOSIS — Z20822 Contact with and (suspected) exposure to covid-19: Secondary | ICD-10-CM | POA: Diagnosis not present

## 2021-02-14 DIAGNOSIS — Z96641 Presence of right artificial hip joint: Secondary | ICD-10-CM | POA: Insufficient documentation

## 2021-02-14 DIAGNOSIS — I2510 Atherosclerotic heart disease of native coronary artery without angina pectoris: Secondary | ICD-10-CM | POA: Diagnosis not present

## 2021-02-14 DIAGNOSIS — Z79899 Other long term (current) drug therapy: Secondary | ICD-10-CM | POA: Diagnosis not present

## 2021-02-14 DIAGNOSIS — Z85828 Personal history of other malignant neoplasm of skin: Secondary | ICD-10-CM | POA: Diagnosis not present

## 2021-02-14 DIAGNOSIS — Z85528 Personal history of other malignant neoplasm of kidney: Secondary | ICD-10-CM | POA: Insufficient documentation

## 2021-02-14 DIAGNOSIS — M545 Low back pain, unspecified: Secondary | ICD-10-CM | POA: Diagnosis not present

## 2021-02-14 LAB — CBC
HCT: 39.9 % (ref 39.0–52.0)
Hemoglobin: 14 g/dL (ref 13.0–17.0)
MCH: 32 pg (ref 26.0–34.0)
MCHC: 35.1 g/dL (ref 30.0–36.0)
MCV: 91.1 fL (ref 80.0–100.0)
Platelets: 198 10*3/uL (ref 150–400)
RBC: 4.38 MIL/uL (ref 4.22–5.81)
RDW: 12.1 % (ref 11.5–15.5)
WBC: 4.3 10*3/uL (ref 4.0–10.5)
nRBC: 0 % (ref 0.0–0.2)

## 2021-02-14 LAB — TROPONIN I (HIGH SENSITIVITY)
Troponin I (High Sensitivity): 6 ng/L (ref <18)
Troponin I (High Sensitivity): 5 ng/L (ref <18)

## 2021-02-14 LAB — RESP PANEL BY RT-PCR (FLU A&B, COVID) ARPGX2
Influenza A by PCR: NEGATIVE
Influenza B by PCR: NEGATIVE
SARS Coronavirus 2 by RT PCR: NEGATIVE

## 2021-02-14 LAB — BASIC METABOLIC PANEL
Anion gap: 8 (ref 5–15)
BUN: 33 mg/dL — ABNORMAL HIGH (ref 8–23)
CO2: 27 mmol/L (ref 22–32)
Calcium: 10.3 mg/dL (ref 8.9–10.3)
Chloride: 105 mmol/L (ref 98–111)
Creatinine, Ser: 1.69 mg/dL — ABNORMAL HIGH (ref 0.61–1.24)
GFR, Estimated: 42 mL/min — ABNORMAL LOW (ref >60)
Glucose, Bld: 166 mg/dL — ABNORMAL HIGH (ref 70–99)
Potassium: 4.8 mmol/L (ref 3.5–5.1)
Sodium: 140 mmol/L (ref 135–145)

## 2021-02-14 MED ORDER — ASPIRIN 325 MG PO TABS
ORAL_TABLET | ORAL | Status: AC
  Filled 2021-02-14: qty 1

## 2021-02-14 MED ORDER — ASPIRIN 81 MG PO CHEW
324.0000 mg | CHEWABLE_TABLET | Freq: Once | ORAL | Status: AC
  Administered 2021-02-14: 324 mg via ORAL

## 2021-02-14 NOTE — ED Provider Notes (Signed)
[name] Provider Note   CSN: [id number] Arrival date & time: 02/14/21  1821     History Chief Complaint  Patient presents with   Chest Pain    [name] is a 75 y.o. male.  Patient had about 1830 had left anterior chest discomfort.  Not associated with shortness of breath not associated with nausea vomiting.  Nonradiating.  Patient took 1 nitroglycerin without any relief.  Patient is not on a baby aspirin a day.  Pain is now resolved.  Pain did not radiate to the back.  Patient has been followed by cardiology in the past known to have a history of a thoracic aortic aneurysm without rupture known to have a history of hypertension left bundle branch block.  Had a cardiac cath in 2011 with minimal coronary artery disease.  Does not normally get any chest pain.  Has a history of prostate cancer undergoing treatment for that.      Past Medical History:  Diagnosis Date   Arthritis 10-22-11   hx. osteoarthritis-knees   Ascending aortic aneurysm    Last CT in July 2012   Coronary artery disease    minimal per cath in 2011   Dyspnea    GERD (gastroesophageal reflux disease)    Heart murmur    History of kidney stones    "YRS AGO"  nothing in last 10 yrs   Hypertension    Lactose intolerance    used to be, not anymore per patient.   Left bundle branch block    Nephrolithiasis    Cancer   Ovarian cancer ([name])    kidney cancer   Prostate cancer ([name])    Skin cancer     Patient Active Problem List   Diagnosis Date Noted   Malignant neoplasm of prostate ([name]) 06/08/2020   Thigh pain 04/20/2019   Lumbar stenosis with neurogenic claudication 01/05/2019   Body mass index (BMI) 28.0-28.9, adult 12/09/2018   Acute low back pain 11/15/2017   Primary osteoarthritis of right hip 03/26/2017   CKD (chronic kidney disease) stage 3, GFR 30-59 ml/min (HCC) 03/04/2017   Epiretinal membrane 11/08/2011   Nuclear cataract 11/08/2011   Posterior vitreous  detachment 11/08/2011   Coronary artery disease    Chest pain    DYSPNEA 02/01/2010   LACTOSE INTOLERANCE 01/31/2010   Essential hypertension 01/31/2010   LBBB (left bundle branch block) 01/31/2010   Aneurysm of thoracic aorta 01/31/2010   G E R D 01/31/2010    Past Surgical History:  Procedure Laterality Date   APPENDECTOMY     BACK SURGERY     BUNIONECTOMY Right    CARDIAC CATHETERIZATION  12/22/09   EF60%/minimal nonobstructive atherosclerotic coronary artery disease/normal lt ventricular function/mod aortic root dilatation   CYSTOSCOPY WITH RETROGRADE PYELOGRAM, URETEROSCOPY AND STENT PLACEMENT Left 07/28/2012   Procedure: CYSTOSCOPY WITH RETROGRADE PYELOGRAM, LEFT URETEROSCOPY ;  Surgeon: Dutch Gray, MD;  Location: [facility];  Service: Urology;  Laterality: Left;   CYSTOSCOPY/RETROGRADE/URETEROSCOPY/STONE EXTRACTION WITH BASKET  10-22-11   1'12   CYSTOSCOPY/URETEROSCOPY/HOLMIUM LASER/STENT PLACEMENT Right 02/18/2020   Procedure: CYSTOSCOPY/RETROGRADE/URETEROSCOPY/HOLMIUM LASER/STENT PLACEMENT;  Surgeon: Raynelle Bring, MD;  Location: [facility];  Service: Urology;  Laterality: Right;   ELBOW SURGERY Right 1990s?   "tendon repaired"   JOINT REPLACEMENT     both knees & R hip   KNEE ARTHROSCOPY     "? side"   LUMBAR SPINE SURGERY  2003   Lumbar fusion -retained hardware([name])   PARTIAL NEPHRECTOMY Right  10/2011   TONSILLECTOMY     TOTAL HIP ARTHROPLASTY Right 03/26/2017   Procedure: RIGHT TOTAL HIP ARTHROPLASTY ANTERIOR APPROACH;  Surgeon: Renette Butters, MD;  Location: [facility];  Service: Orthopedics;  Laterality: Right;   TOTAL KNEE ARTHROPLASTY     bilateral       Family History  Problem Relation Age of Onset   Prostate cancer Father    Pancreatic cancer Father    Heart attack Brother    Breast cancer Neg Hx    Colon cancer Neg Hx     Social History   Tobacco Use   Smoking status: Never   Smokeless tobacco: Never  Vaping Use   Vaping Use: Never used  Substance  Use Topics   Alcohol use: No   Drug use: No    Home Medications Prior to Admission medications   Medication Sig Start Date End Date Taking? Authorizing Provider  amLODipine (NORVASC) 5 MG tablet TAKE 1 TABLET BY MOUTH EVERY DAY 11/22/20   Martinique, Peter M, MD  aspirin EC 81 MG tablet Take 81 mg by mouth daily.     [provider]  losartan (COZAAR) 100 MG tablet TAKE 1 TABLET (100 MG TOTAL) BY MOUTH DAILY. PATIENT NEEDS OFFICE VISIT FOR FUTURE REFILLS 12/20/20   Martinique, Peter M, MD  potassium citrate (UROCIT-K) 10 MEQ (1080 MG) SR tablet Take 20 mEq by mouth 2 (two) times daily.    [provider]  pravastatin (PRAVACHOL) 40 MG tablet Take 40 mg by mouth at bedtime.  02/18/17   [provider]  tamsulosin (FLOMAX) 0.4 MG CAPS capsule Take 0.8 mg by mouth at bedtime.  01/03/17   [provider]    Allergies    Tramadol  Review of Systems   Review of Systems  Constitutional:  Negative for chills and fever.  HENT:  Negative for ear pain and sore throat.   Eyes:  Negative for pain and visual disturbance.  Respiratory:  Negative for cough and shortness of breath.   Cardiovascular:  Positive for chest pain. Negative for palpitations.  Gastrointestinal:  Negative for abdominal pain and vomiting.  Genitourinary:  Negative for dysuria and hematuria.  Musculoskeletal:  Negative for arthralgias and back pain.  Skin:  Negative for color change and rash.  Neurological:  Negative for seizures and syncope.  All other systems reviewed and are negative.  Physical Exam Updated Vital Signs BP 137/77   Pulse 80   Temp 98 F (36.7 C)   Resp 16   Ht 1.803 m (5\' 11" )   Wt 84.8 kg   SpO2 97%   BMI 26.08 kg/m   Physical Exam Vitals and nursing note reviewed.  Constitutional:      General: He is not in acute distress.    Appearance: Normal appearance. He is well-developed.  HENT:     Head: Normocephalic and atraumatic.  Eyes:     Extraocular Movements:  Extraocular movements intact.     Conjunctiva/sclera: Conjunctivae normal.     Pupils: Pupils are equal, round, and reactive to light.  Cardiovascular:     Rate and Rhythm: Normal rate and regular rhythm.     Heart sounds: No murmur heard. Pulmonary:     Effort: Pulmonary effort is normal. No respiratory distress.     Breath sounds: Normal breath sounds. No wheezing or rhonchi.  Abdominal:     Palpations: Abdomen is soft.     Tenderness: There is no abdominal tenderness.  Musculoskeletal:  General: No swelling.     Cervical back: Normal range of motion and neck supple.  Skin:    General: Skin is warm and dry.     Capillary Refill: Capillary refill takes less than 2 seconds.  Neurological:     General: No focal deficit present.     Mental Status: He is alert and oriented to person, place, and time.  Psychiatric:        Mood and Affect: Mood normal.    ED Results / Procedures / Treatments   Labs (all labs ordered are listed, but only abnormal results are displayed) Labs Reviewed  BASIC METABOLIC PANEL - Abnormal; Notable for the following components:      Result Value   Glucose, Bld 166 (*)    BUN 33 (*)    Creatinine, Ser 1.69 (*)    GFR, Estimated 42 (*)    All other components within normal limits  RESP PANEL BY RT-PCR (FLU A&B, COVID) ARPGX2  CBC  TROPONIN I (HIGH SENSITIVITY)  TROPONIN I (HIGH SENSITIVITY)    EKG EKG Interpretation  Date/Time:  Tuesday February 14 2021 18:43:51 EST Ventricular Rate:  88 PR Interval:  198 QRS Duration: 164 QT Interval:  436 QTC Calculation: 527 R Axis:   53 Text Interpretation: Normal sinus rhythm Non-specific intra-ventricular conduction block Abnormal ECG Left bundle branch block No significant change since last tracing Confirmed by Fredia Sorrow [phone]) on 02/14/2021 7:47:14 PM  Radiology DG Chest 2 View  Result Date: 02/14/2021 CLINICAL DATA:  Left-sided chest pain EXAM: CHEST - 2 VIEW COMPARISON:  03/14/2017  FINDINGS: The heart size and mediastinal contours are within normal limits. Both lungs are clear. The visualized skeletal structures are unremarkable. IMPRESSION: No active cardiopulmonary disease. Electronically Signed   By: Ulyses Jarred M.D.   On: 02/14/2021 19:30    Procedures Procedures   Medications Ordered in ED Medications  aspirin chewable tablet 324 mg (324 mg Oral Given 02/14/21 2240)  aspirin 325 MG tablet (  Given During Downtime 02/14/21 2244)    ED Course  I have reviewed the triage vital signs and the nursing notes.  Pertinent labs & imaging results that were available during my care of the patient were reviewed by me and considered in my medical decision making (see chart for details).    MDM Rules/Calculators/A&P                           Patient chest pain-free at this point in time.  Initial troponin was 6 repeat was 5.  COVID testing negative.  Chest x-ray without any acute findings.  EKG consistent with left bundle branch block no significant changes.  GFR 42 patient known to have some chronic kidney disease.  We will give aspirin here and reassess.  Clinically troponins are reassuring.  However it was close to the time that he had pain.  Patient at the very least will need to follow-up with cardiology.  Patient has remained chest pain-free. Final Clinical Impres basic metabolic panel blood sugar 166.  Creatinine at 1.69.  sion(s) / ED Diagnoses Final diagnoses:  Precordial pain    Rx / DC Orders ED Discharge Orders     None        Fredia Sorrow, MD 02/14/21 2333

## 2021-02-14 NOTE — ED Triage Notes (Signed)
Pt arrives pov, ambulatory to triage, endorses left side CP that started while eating. Pt took 1 nitro, no relief.

## 2021-02-14 NOTE — Discharge Instructions (Signed)
Would recommend starting take a baby aspirin a day.  If you develop chest pain that last 20 minutes or longer get seen right away.  Make an appointment to follow-up with cardiology.

## 2021-02-24 DIAGNOSIS — Z6829 Body mass index (BMI) 29.0-29.9, adult: Secondary | ICD-10-CM | POA: Diagnosis not present

## 2021-02-24 DIAGNOSIS — I10 Essential (primary) hypertension: Secondary | ICD-10-CM | POA: Diagnosis not present

## 2021-02-24 DIAGNOSIS — S32009K Unspecified fracture of unspecified lumbar vertebra, subsequent encounter for fracture with nonunion: Secondary | ICD-10-CM | POA: Diagnosis not present

## 2021-02-24 DIAGNOSIS — M545 Low back pain, unspecified: Secondary | ICD-10-CM | POA: Diagnosis not present

## 2021-02-26 NOTE — Progress Notes (Signed)
Cardiology Office Note    Date:  02/28/2021   ID:  [name], DOB 26-Dec-1945, MRN [id number]  PCP:  Leanna Battles, MD  Cardiologist: Dr. Martinique  No chief complaint on file.   History of Present Illness:    [name] is a 75 y.o. male with past medical history of thoracic aortic aneurysm, Stage 3 CKD, chronic LBBB, and minimal CAD by cath in 2011 who presents for follow up   He was seen in 02/2016 and reported having dyspnea on exertion in the warmer temperatures but denied any associated chest discomfort. A repeat CTA was obtained in 03/2016 and showed a stable thoracic aortic aneurysm measuring 4.5cm in diameter with no acute changes (at 4.5 cm in 2015).   He was  seen in Dec 2018 for preoperative cardiac clearance for an  hip replacement. This was performed in January 9798 without complications. Repeat CT chest in February 2019 and March 2021 showed no change in thoracic aneurysm.  In October 2020 he underwent lumbar decompression and fusion for lumbar stenosis with Dr Arnoldo Morale.  In December 2021 he had cystoscopy with removal of right ureteral stone and lithotripsy. Since then has been diagnosed with prostate CA. He did have RT for that. Did get hormonal therapy but states that exhausted him. Now just monitoring PSA.   He was seen in the ED on Nov 28 with complaints of chest pain. CXR showed no change. Ecg showed chronic LBBB. Creatinine improved to 1.69. troponins were negative x 2. Seen today for follow up. He states this pain resolved. No recurrence.  On follow up today he is doing well. He notes some SOB when he goes up stairs. Not with riding bike. Goes to the gym at least twice a week. Is looking forward to playing golf again.   Past Medical History:  Diagnosis Date   Arthritis 10-22-11   hx. osteoarthritis-knees   Ascending aortic aneurysm    Last CT in July 2012   Coronary artery disease    minimal per cath in 2011   Dyspnea    GERD (gastroesophageal  reflux disease)    Heart murmur    History of kidney stones    "YRS AGO"  nothing in last 10 yrs   Hypertension    Lactose intolerance    used to be, not anymore per patient.   Left bundle branch block    Nephrolithiasis    Cancer   Ovarian cancer ([place])    kidney cancer   Prostate cancer [facility])    Skin cancer     Past Surgical History:  Procedure Laterality Date   APPENDECTOMY     BACK SURGERY     BUNIONECTOMY Right    CARDIAC CATHETERIZATION  12/22/09   EF60%/minimal nonobstructive atherosclerotic coronary artery disease/normal lt ventricular function/mod aortic root dilatation   CYSTOSCOPY WITH RETROGRADE PYELOGRAM, URETEROSCOPY AND STENT PLACEMENT Left 07/28/2012   Procedure: CYSTOSCOPY WITH RETROGRADE PYELOGRAM, LEFT URETEROSCOPY ;  Surgeon: Dutch Gray, MD;  Location: [facility];  Service: Urology;  Laterality: Left;   CYSTOSCOPY/RETROGRADE/URETEROSCOPY/STONE EXTRACTION WITH BASKET  10-22-11   1'12   CYSTOSCOPY/URETEROSCOPY/HOLMIUM LASER/STENT PLACEMENT Right 02/18/2020   Procedure: CYSTOSCOPY/RETROGRADE/URETEROSCOPY/HOLMIUM LASER/STENT PLACEMENT;  Surgeon: Raynelle Bring, MD;  Location: [facility];  Service: Urology;  Laterality: Right;   ELBOW SURGERY Right 1990s?   "tendon repaired"   JOINT REPLACEMENT     both knees & R hip   KNEE ARTHROSCOPY     "? side"   LUMBAR SPINE SURGERY  2003   Lumbar fusion -retained hardware([name])   PARTIAL NEPHRECTOMY Right 10/2011   TONSILLECTOMY     TOTAL HIP ARTHROPLASTY Right 03/26/2017   Procedure: RIGHT TOTAL HIP ARTHROPLASTY ANTERIOR APPROACH;  Surgeon: Renette Butters, MD;  Location: [facility];  Service: Orthopedics;  Laterality: Right;   TOTAL KNEE ARTHROPLASTY     bilateral    Current Medications: Outpatient Medications Prior to Visit  Medication Sig Dispense Refill   amLODipine (NORVASC) 5 MG tablet TAKE 1 TABLET BY MOUTH EVERY DAY 90 tablet 1   aspirin EC 81 MG tablet Take 81 mg by mouth daily.      losartan (COZAAR) 100 MG tablet  TAKE 1 TABLET (100 MG TOTAL) BY MOUTH DAILY. PATIENT NEEDS OFFICE VISIT FOR FUTURE REFILLS 90 tablet 3   potassium citrate (UROCIT-K) 10 MEQ (1080 MG) SR tablet Take 20 mEq by mouth 2 (two) times daily.     pravastatin (PRAVACHOL) 40 MG tablet Take 40 mg by mouth at bedtime.   3   tamsulosin (FLOMAX) 0.4 MG CAPS capsule Take 0.8 mg by mouth at bedtime.   11   gabapentin (NEURONTIN) 100 MG capsule Take 100 mg by mouth 3 (three) times daily.     No facility-administered medications prior to visit.     Allergies:   Tramadol   Social History   Socioeconomic History   Marital status: Married    Spouse name: [name]   Number of children: 3   Years of education: Not on file   Highest education level: Not on file  Occupational History   Occupation: sales [employer]    Comment: Tree surgeon  Tobacco Use   Smoking status: Never   Smokeless tobacco: Never  Vaping Use   Vaping Use: Never used  Substance and Sexual Activity   Alcohol use: No   Drug use: No   Sexual activity: Yes  Other Topics Concern   Not on file  Social History Narrative   3 grown children: [name], and [name].   Social Determinants of Health   Financial Resource Strain: Not on file  Food Insecurity: Not on file  Transportation Needs: Not on file  Physical Activity: Not on file  Stress: Not on file  Social Connections: Not on file     Family History:  The patient's family history includes Cancer in his father; Heart attack in his brother.   Review of Systems:   As noted in HPI. All other systems reviewed and are otherwise negative except as noted above.   Physical Exam:    VS:  BP 112/68 (BP Location: Left Arm, Patient Position: Sitting, Cuff Size: Normal)   Pulse 99   Resp 20   Ht 5\' 11"  (1.803 m)   Wt 208 lb (94.3 kg)   SpO2 95%   BMI 29.01 kg/m    GENERAL:  Well appearing WM in NAD HEENT:  PERRL, EOMI, sclera are clear. Oropharynx is clear. NECK:  No jugular venous distention, carotid  upstroke brisk and symmetric, no bruits, no thyromegaly or adenopathy LUNGS:  Clear to auscultation bilaterally CHEST:  Unremarkable HEART:  RRR,  PMI not displaced or sustained,S1 and S2 within normal limits, no S3, no S4: no clicks, no rubs, gr 8-5/6 systolic murmur RUSB. ABD:  Soft, nontender. BS +, no masses or bruits. No hepatomegaly, no splenomegaly EXT:  2 + pulses throughout, no edema, no cyanosis no clubbing SKIN:  Warm and dry.  No rashes NEURO:  Alert and oriented x 3.  Cranial nerves II through XII intact. PSYCH:  Cognitively intact      Wt Readings from Last 3 Encounters:  02/28/21 208 lb (94.3 kg)  02/14/21 187 lb (84.8 kg)  06/07/20 200 lb (90.7 kg)     Studies/Labs Reviewed:   EKG:  EKG is not ordered today.  The ekg ordered done in ED showed no change in LBBB   Recent Labs: 02/14/2021: BUN 33; Creatinine, Ser 1.69; Hemoglobin 14.0; Platelets 198; Potassium 4.8; Sodium 140   Lipid Panel No results found for: CHOL, TRIG, HDL, CHOLHDL, VLDL, LDLCALC, LDLDIRECT   Labs dated 01/31/18: cholesterol 147, triglycerides 130, HDL 44, LDL 77. A1c 5.1%. Creatinine 1.4. otherwise chemistries and Hgb normal.  Dated 02/10/19: cholesterol 136, triglycerides 118, HDL 47, LDL 65. A1c 5.2%. creatinine 1.2. otherwise CMET and CBC normal Dated 02/22/20: cholesterol 128, triglycerides 207, HDL 37, LDL 50. A1c 5.2%. BUN 27, creatinine 2.31. otherwise CMET and CBC normal.   Additional studies/ records that were reviewed today include:   Cardiac Catheterization: 2011    Echocardiogram: 07/2013 Study Conclusions  - Left ventricle: Abnormal septal motion. The cavity size was   normal. Wall thickness was increased in a pattern of moderate   LVH. Systolic function was normal. The estimated ejection   fraction was in the range of 50% to 55%. Doppler parameters are   consistent with abnormal left ventricular relaxation (grade 1   diastolic dysfunction). - Atrial septum: No defect or  patent foramen ovale was identified.   CT ANGIOGRAPHY CHEST WITH CONTRAST   TECHNIQUE: Multidetector CT imaging of the chest was performed using the standard protocol during bolus administration of intravenous contrast. Multiplanar CT image reconstructions and MIPs were obtained to evaluate the vascular anatomy.   CONTRAST:  68mL ISOVUE-370 IOPAMIDOL (ISOVUE-370) INJECTION 76%   COMPARISON:  Prior CTA of the chest 04/06/2016   FINDINGS: Cardiovascular: Stable fusiform aneurysmal dilatation of the ascending thoracic aorta of with a transverse diameter 4.5 cm. Conventional 3 vessel arch anatomy. No significant atherosclerotic plaque. The descending and transverse thoracic aorta are normal in caliber. No effacement of the sino-tubular junction. Unremarkable aortic root. The heart is normal in size. No pericardial effusion. Unremarkable pulmonary arteries.   Mediastinum/Nodes: Unremarkable CT appearance of the thyroid gland. No suspicious mediastinal or hilar adenopathy. No soft tissue mediastinal mass. The thoracic esophagus is unremarkable.   Lungs/Pleura: Lungs are clear. No pleural effusion or pneumothorax.   Upper Abdomen: Visualized upper abdominal organs are unremarkable. Stable hyperdense cyst in the upper pole of the right kidney compared to 01/25/2015.   Musculoskeletal: No acute fracture or aggressive appearing lytic or blastic osseous lesion.   Review of the MIP images confirms the above findings.   IMPRESSION: Stable 4.5 cm fusiform ascending thoracic aortic aneurysm. No interval change. Ascending thoracic aortic aneurysm. Recommend semi-annual imaging followup by CTA or MRA and referral to cardiothoracic surgery if not already obtained. This recommendation follows 2010 ACCF/AHA/AATS/ACR/ASA/SCA/SCAI/SIR/STS/SVM Guidelines for the Diagnosis and Management of Patients With Thoracic Aortic Disease. Circulation. 2010; 121: X528-U132   Signed,   Corey Peaches, MD   [facility]   [facility]     Electronically Signed   By: [name] M.D.   On: 04/29/2017 16:38  CLINICAL DATA:  Follow-up thoracic aortic aneurysm.  CT 04/29/2017   EXAM: CT ANGIOGRAPHY CHEST WITH CONTRAST   TECHNIQUE: Multidetector CT imaging of the chest was performed using the standard protocol during bolus administration of intravenous contrast. Multiplanar  CT image reconstructions and MIPs were obtained to evaluate the vascular anatomy.   CONTRAST:  179mL OMNIPAQUE IOHEXOL 350 MG/ML SOLN   COMPARISON:  CT 04/29/2017.   FINDINGS: Cardiovascular: Ascending thoracic aorta measures 4.3 cm in maximum dimension on sagittal image 100/10. This compares to 4.3 cm on comparison exam. Aneurysm measures 4.2 cm on axial image (80/16) and 4.1 cm on coronal imaging.   The descending thoracic aorta is normal caliber at 2.5 cm.   Mediastinum/Nodes: No axillary or supraclavicular adenopathy. No mediastinal hilar adenopathy. No pericardial effusion. Esophagus normal.   Lungs/Pleura: No suspicious pulmonary nodules. Mild subpleural nodularity in the superior aspect of the RIGHT lower lobe (image 60/7 and 67/7) slightly more prominent but favored benign.   Upper Abdomen: Limited view of the liver, kidneys, pancreas are unremarkable. Normal adrenal glands. Stable mixed density cysts in the upper poles of the kidneys.   Musculoskeletal: No aggressive osseous lesion   Review of the MIP images confirms the above findings.   IMPRESSION: 1. Stable ascending thoracic aortic aneurysm at 4.3 cm. Recommend annual imaging followup by CTA or MRA. This recommendation follows 2010 ACCF/AHA/AATS/ACR/ASA/SCA/SCAI/SIR/STS/SVM Guidelines for the Diagnosis and Management of Patients with Thoracic Aortic Disease. Circulation. 2010; 121: Y801-K553. Aortic aneurysm NOS (ICD10-I71.9). 2. No acute pulmonary parenchymal  findings.     Electronically Signed   By: [name] M.D.   On: 05/21/2019 08:46      Assessment:    1. Aneurysm of ascending aorta without rupture   2. Essential hypertension   3. LBBB (left bundle branch block)   4. Hyperlipidemia LDL goal <70       Plan:   In order of problems listed above:  1. HTN - BP is well controlled. - continue Amlodipine 5mg  daily and Losartan 100mg  daily. Avoid use of NSAIDs with CKD.   2. LBBB - chronic for 20+ years. Catheterization in 2011 showed minimal CAD.  - continue ASA and statin therapy.   3. HLD - followed by PCP. Remains on Pravastatin 20mg  daily. Excellent control.  4. Thoracic Aortic Aneurysm -  CT in March 2021 showed no change with dimension of 4.3 cm. - aortic dimensions have been stable on CT since 2011. Would suggest we can reduce surveillance to every 3 years.   5. Stage 3 CKD  6. Atypical chest pain. I suspect this was more musculoskeletal.   7. Prostate CA. S/p RT    Signed, Corey Vasseur Martinique, MD  02/28/2021 10:44 AM    [facility] [address] [place], [place] [address] Phone: [phone]

## 2021-02-28 ENCOUNTER — Encounter: Payer: Self-pay | Admitting: Cardiology

## 2021-02-28 ENCOUNTER — Other Ambulatory Visit: Payer: Self-pay

## 2021-02-28 ENCOUNTER — Ambulatory Visit (INDEPENDENT_AMBULATORY_CARE_PROVIDER_SITE_OTHER): Payer: Medicare HMO | Admitting: Cardiology

## 2021-02-28 VITALS — BP 112/68 | HR 99 | Resp 20 | Ht 71.0 in | Wt 208.0 lb

## 2021-02-28 DIAGNOSIS — E785 Hyperlipidemia, unspecified: Secondary | ICD-10-CM

## 2021-02-28 DIAGNOSIS — I10 Essential (primary) hypertension: Secondary | ICD-10-CM

## 2021-02-28 DIAGNOSIS — I447 Left bundle-branch block, unspecified: Secondary | ICD-10-CM | POA: Diagnosis not present

## 2021-02-28 DIAGNOSIS — I7121 Aneurysm of the ascending aorta, without rupture: Secondary | ICD-10-CM

## 2021-03-01 ENCOUNTER — Other Ambulatory Visit: Payer: Self-pay | Admitting: Neurosurgery

## 2021-03-01 DIAGNOSIS — M79641 Pain in right hand: Secondary | ICD-10-CM | POA: Diagnosis not present

## 2021-03-09 DIAGNOSIS — M65331 Trigger finger, right middle finger: Secondary | ICD-10-CM | POA: Diagnosis not present

## 2021-03-09 DIAGNOSIS — M65341 Trigger finger, right ring finger: Secondary | ICD-10-CM | POA: Diagnosis not present

## 2021-03-10 NOTE — Pre-Procedure Instructions (Signed)
Surgical Instructions    Your procedure is scheduled on Thursday, December 29th.  Report to [facility] Main Entrance "A" at 5:30 A.M., then check in with the Admitting office.  Call this number if you have problems the morning of surgery:  [phone]   If you have any questions prior to your surgery date call [phone]: Open Monday-Friday 8am-4pm    Remember:  Do not eat or drink after midnight the night before your surgery    Take these medicines the morning of surgery with A SIP OF WATER  amLODipine (NORVASC)   Follow your surgeon's instructions on when to stop Aspirin.  If no instructions were given by your surgeon then you will need to call the office to get those instructions.    As of today, STOP taking any Aleve, Naproxen, Ibuprofen, Motrin, Advil, Goody's, BC's, all herbal medications, fish oil, and all vitamins.                     Do NOT Smoke (Tobacco/Vaping) or drink Alcohol 24 hours prior to your procedure.  If you use a CPAP at night, you may bring all equipment for your overnight stay.   Contacts, glasses, piercing's, hearing aid's, dentures or partials may not be worn into surgery, please bring cases for these belongings.    For patients admitted to the hospital, discharge time will be determined by your treatment team.   Patients discharged the day of surgery will not be allowed to drive home, and someone needs to stay with them for 24 hours.  NO VISITORS WILL BE ALLOWED IN PRE-OP WHERE PATIENTS GET READY FOR SURGERY.  ONLY 1 SUPPORT PERSON MAY BE PRESENT IN THE WAITING ROOM WHILE YOU ARE IN SURGERY.  IF YOU ARE TO BE ADMITTED, ONCE YOU ARE IN YOUR ROOM YOU WILL BE ALLOWED TWO (2) VISITORS.  Minor children may have two parents present. Special consideration for safety and communication needs will be reviewed on a case by case basis.   Special instructions:   [name]- Preparing For Surgery  Before surgery, you can play an important role. Because skin is  not sterile, your skin needs to be as free of germs as possible. You can reduce the number of germs on your skin by washing with CHG (chlorahexidine gluconate) Soap before surgery.  CHG is an antiseptic cleaner which kills germs and bonds with the skin to continue killing germs even after washing.    Oral Hygiene is also important to reduce your risk of infection.  Remember - BRUSH YOUR TEETH THE MORNING OF SURGERY WITH YOUR REGULAR TOOTHPASTE  Please do not use if you have an allergy to CHG or antibacterial soaps. If your skin becomes reddened/irritated stop using the CHG.  Do not shave (including legs and underarms) for at least 48 hours prior to first CHG shower. It is OK to shave your face.  Please follow these instructions carefully.   Shower the NIGHT BEFORE SURGERY and the MORNING OF SURGERY  If you chose to wash your hair, wash your hair first as usual with your normal shampoo.  After you shampoo, rinse your hair and body thoroughly to remove the shampoo.  Use CHG Soap as you would any other liquid soap. You can apply CHG directly to the skin and wash gently with a scrungie or a clean washcloth.   Apply the CHG Soap to your body ONLY FROM THE NECK DOWN.  Do not use on open wounds or open sores.  Avoid contact with your eyes, ears, mouth and genitals (private parts). Wash Face and genitals (private parts)  with your normal soap.   Wash thoroughly, paying special attention to the area where your surgery will be performed.  Thoroughly rinse your body with warm water from the neck down.  DO NOT shower/wash with your normal soap after using and rinsing off the CHG Soap.  Pat yourself dry with a CLEAN TOWEL.  Wear CLEAN PAJAMAS to bed the night before surgery  Place CLEAN SHEETS on your bed the night before your surgery  DO NOT SLEEP WITH PETS.   Day of Surgery: Shower with CHG soap. Do not wear jewelry. Do not wear lotions, powders, colognes, or deodorant. Do not shave 48  hours prior to surgery.  Men may shave face and neck. Do not bring valuables to the hospital. [facility] is not responsible for any belongings or valuables. Wear Clean/Comfortable clothing the morning of surgery Remember to brush your teeth WITH YOUR REGULAR TOOTHPASTE.   Please read over the following fact sheets that you were given.   3 days prior to your procedure or After your COVID test   You are not required to quarantine however you are required to wear a well-fitting mask when you are out and around people not in your household. If your mask becomes wet or soiled, replace with a new one.   Wash your hands often with soap and water for 20 seconds or clean your hands with an alcohol-based hand sanitizer that contains at least 60% alcohol.   Do not share personal items.   Notify your provider:  o if you are in close contact with someone who has COVID  o or if you develop a fever of 100.4 or greater, sneezing, cough, sore throat, shortness of breath or body aches.

## 2021-03-14 ENCOUNTER — Other Ambulatory Visit: Payer: Self-pay

## 2021-03-14 ENCOUNTER — Encounter (HOSPITAL_COMMUNITY)
Admission: RE | Admit: 2021-03-14 | Discharge: 2021-03-14 | Disposition: A | Discharge status: Home / Self Care | Payer: Medicare HMO | Source: Ambulatory Visit | Attending: Neurosurgery | Admitting: Neurosurgery

## 2021-03-14 ENCOUNTER — Encounter (HOSPITAL_COMMUNITY): Payer: Self-pay

## 2021-03-14 VITALS — BP 120/67 | HR 108 | Temp 98.1°F | Resp 18 | Ht 71.5 in

## 2021-03-14 DIAGNOSIS — I2510 Atherosclerotic heart disease of native coronary artery without angina pectoris: Secondary | ICD-10-CM | POA: Insufficient documentation

## 2021-03-14 DIAGNOSIS — I454 Nonspecific intraventricular block: Secondary | ICD-10-CM | POA: Diagnosis not present

## 2021-03-14 DIAGNOSIS — I712 Thoracic aortic aneurysm, without rupture, unspecified: Secondary | ICD-10-CM | POA: Diagnosis not present

## 2021-03-14 DIAGNOSIS — Z01818 Encounter for other preprocedural examination: Secondary | ICD-10-CM

## 2021-03-14 DIAGNOSIS — I447 Left bundle-branch block, unspecified: Secondary | ICD-10-CM | POA: Diagnosis not present

## 2021-03-14 DIAGNOSIS — Z20822 Contact with and (suspected) exposure to covid-19: Secondary | ICD-10-CM | POA: Diagnosis not present

## 2021-03-14 DIAGNOSIS — N183 Chronic kidney disease, stage 3 unspecified: Secondary | ICD-10-CM | POA: Insufficient documentation

## 2021-03-14 DIAGNOSIS — Z01812 Encounter for preprocedural laboratory examination: Secondary | ICD-10-CM | POA: Diagnosis not present

## 2021-03-14 LAB — TYPE AND SCREEN
ABO/RH(D): A POS
Antibody Screen: NEGATIVE

## 2021-03-14 LAB — SARS CORONAVIRUS 2 (TAT 6-24 HRS): SARS Coronavirus 2: NEGATIVE

## 2021-03-14 LAB — SURGICAL PCR SCREEN
MRSA, PCR: NEGATIVE
Staphylococcus aureus: NEGATIVE

## 2021-03-14 NOTE — Progress Notes (Signed)
PCP - Dr. Leanna Battles Cardiologist - Dr. Peter Martinique  PPM/ICD - n/a  Chest x-ray - 02/14/21 EKG - 02/27/21 Stress Test - 15+ years ago ECHO - 2015 Cardiac Cath - 2011  Sleep Study -denies  CPAP - denies  Blood Thinner Instructions: n/a Aspirin Instructions: Per pt, he has held his ASA for about 3 weeks in preparation for surgery.   NPO at midnight  COVID TEST- 03/14/21, done in PAT.  Anesthesia review: Pt seen in ED 02/14/21 for CP. EKG, lab work, covid test and CXR done. Pt f/u with Dr. Martinique after being seen in ED. CP resolved and pt has not had any CP since that episode. No recommendations from cardiologist prior to surgery.   Patient denies shortness of breath, fever, cough and chest pain at PAT appointment   All instructions explained to the patient, with a verbal understanding of the material. Patient agrees to go over the instructions while at home for a better understanding. Patient also instructed to self quarantine after being tested for COVID-19. The opportunity to ask questions was provided.

## 2021-03-15 NOTE — Anesthesia Preprocedure Evaluation (Addendum)
Anesthesia Evaluation  Patient identified by MRN, date of birth, ID band Patient awake    Reviewed: Allergy & Precautions, NPO status , Patient's Chart, lab work & pertinent test results  History of Anesthesia Complications Negative for: history of anesthetic complications  Airway Mallampati: II  TM Distance: >3 FB Neck ROM: Full    Dental  (+) Dental Advisory Given, Caps   Pulmonary  04/10/2021 SARS coronavirus NEG   breath sounds clear to auscultation       Cardiovascular hypertension, Pt. on medications (-) angina+ Peripheral Vascular Disease (asc aortic aneurysm 4.2cm)  (-) CAD  Rhythm:Regular Rate:Normal     Neuro/Psych Back pain    GI/Hepatic Neg liver ROS, GERD  Controlled,  Endo/Other  negative endocrine ROS  Renal/GU Renal InsufficiencyRenal disease   Prostate cancer    Musculoskeletal  (+) Arthritis ,   Abdominal   Peds  Hematology negative hematology ROS (+)   Anesthesia Other Findings   Reproductive/Obstetrics                           Anesthesia Physical Anesthesia Plan  ASA: 3  Anesthesia Plan: General   Post-op Pain Management: Tylenol PO (pre-op)   Induction: Intravenous  PONV Risk Score and Plan: 2 and Ondansetron and Dexamethasone  Airway Management Planned: Oral ETT  Additional Equipment: None  Intra-op Plan:   Post-operative Plan: Extubation in OR  Informed Consent: I have reviewed the patients History and Physical, chart, labs and discussed the procedure including the risks, benefits and alternatives for the proposed anesthesia with the patient or authorized representative who has indicated his/her understanding and acceptance.     Dental advisory given  Plan Discussed with: CRNA and Surgeon  Anesthesia Plan Comments: (PAT note by Karoline Caldwell, PA-C: Follows with cardiology for history of thoracic aortic aneurysm (4.3 cm by CT 05/2019, stable),  chronic LBBB, and minimal CAD by cath in 2011.  He was last seen by Dr. Martinique 02/28/2021 and noted to be doing well at that time, BP well controlled, exercising at the gym twice per week.  Recent episode of chest pain was felt to be musculoskeletal.  Recommended surveillance every 3 years for thoracic aortic aneurysm.  Stage 3 CKD, creatinine 1.69 on labs from 02/14/2021.  CBC WNL.  EKG 02/27/21: NSR. Rate 88. Nonspecific intraventricular block.   CHEST - 2 VIEW 02/04/21: COMPARISON: 03/14/2017  FINDINGS: The heart size and mediastinal contours are within normal limits. Both lungs are clear. The visualized skeletal structures are unremarkable.  IMPRESSION: No active cardiopulmonary disease.  CTA chest /aorta 05/20/19: IMPRESSION: 1. Stable ascending thoracic aortic aneurysm at 4.3 cm. Recommend annual imaging followup by CTA or MRA. This recommendation follows 2010 ACCF/AHA/AATS/ACR/ASA/SCA/SCAI/SIR/STS/SVM Guidelines for the Diagnosis and Management of Patients with Thoracic Aortic Disease. Circulation. 2010; 121: V616-W737. Aortic aneurysm NOS (ICD10-I71.9). 2. No acute pulmonary parenchymal findings.   TTE 08/03/2013: - Left ventricle: Abnormal septal motion. The cavity size was  normal. Wall thickness was increased in a pattern of moderate  LVH. Systolic function was normal. The estimated ejection  fraction was in the range of 50% to 55%. Doppler parameters are  consistent with abnormal left ventricular relaxation (grade 1  diastolic dysfunction).  - Atrial septum: No defect or patent foramen ovale was identified. )      Anesthesia Quick Evaluation

## 2021-03-15 NOTE — Progress Notes (Signed)
Anesthesia Chart Review:  Follows with cardiology for history of  thoracic aortic aneurysm (4.3 cm by CT 05/2019, stable), chronic LBBB, and minimal CAD by cath in 2011.  He was last seen by Dr. Martinique 02/28/2021 and noted to be doing well at that time, BP well controlled, exercising at the gym twice per week.  Recent episode of chest pain was felt to be musculoskeletal.  Recommended surveillance every 3 years for thoracic aortic aneurysm.  Stage 3 CKD, creatinine 1.69 on labs from 02/14/2021.  CBC WNL.  EKG 02/27/21: NSR. Rate 88. Nonspecific intraventricular block.   CHEST - 2 VIEW 02/04/21: COMPARISON:  03/14/2017   FINDINGS: The heart size and mediastinal contours are within normal limits. Both lungs are clear. The visualized skeletal structures are unremarkable.   IMPRESSION: No active cardiopulmonary disease.  CTA chest /aorta 05/20/19: IMPRESSION: 1. Stable ascending thoracic aortic aneurysm at 4.3 cm. Recommend annual imaging followup by CTA or MRA. This recommendation follows 2010 ACCF/AHA/AATS/ACR/ASA/SCA/SCAI/SIR/STS/SVM Guidelines for the Diagnosis and Management of Patients with Thoracic Aortic Disease. Circulation. 2010; 121: G295-M841. Aortic aneurysm NOS (ICD10-I71.9). 2. No acute pulmonary parenchymal findings.    TTE 08/03/2013: - Left ventricle: Abnormal septal motion. The cavity size was    normal. Wall thickness was increased in a pattern of moderate    LVH. Systolic function was normal. The estimated ejection    fraction was in the range of 50% to 55%. Doppler parameters are    consistent with abnormal left ventricular relaxation (grade 1    diastolic dysfunction).  - Atrial septum: No defect or patent foramen ovale was identified.    Wynonia Musty [facility] Stay [facility]/Anesthesiology Phone [phone] 03/15/2021 10:19 AM

## 2021-03-22 DIAGNOSIS — M65341 Trigger finger, right ring finger: Secondary | ICD-10-CM | POA: Diagnosis not present

## 2021-03-22 DIAGNOSIS — M65331 Trigger finger, right middle finger: Secondary | ICD-10-CM | POA: Diagnosis not present

## 2021-03-27 ENCOUNTER — Telehealth: Payer: Self-pay | Admitting: *Deleted

## 2021-03-28 DIAGNOSIS — C61 Malignant neoplasm of prostate: Secondary | ICD-10-CM | POA: Diagnosis not present

## 2021-04-04 ENCOUNTER — Other Ambulatory Visit: Payer: Self-pay

## 2021-04-04 ENCOUNTER — Inpatient Hospital Stay: Payer: Medicare HMO | Attending: Adult Health | Admitting: *Deleted

## 2021-04-04 ENCOUNTER — Telehealth: Payer: Self-pay | Admitting: *Deleted

## 2021-04-04 ENCOUNTER — Encounter: Payer: Self-pay | Admitting: *Deleted

## 2021-04-04 DIAGNOSIS — C61 Malignant neoplasm of prostate: Secondary | ICD-10-CM

## 2021-04-04 NOTE — Progress Notes (Signed)
°   2 Identifiers used for verification purposes only. No vital signs taken at visit being this was a telephone visit. SCP reviewed and completed. SDOH assessed and completed. Pt denies pain today. Pt denies fatigue. Pt no longer has diarrhea, it has resolved and now having soft-formed stools. Appetite is good. Pt is sleeping well only getting up to bathroom at night x 2. Urine flow is normal, no burning with urination. Pt stated urinary urgency is better. Pt will visit with PCP next month for annual physical. He sees Dr. Ubaldo Glassing every 6 months  for skin health.Pt will see urology in 2 weeks for PSA results.Pt is no longer taking Eligard injections. Pt exercises 3 days a week at the "[facility]". Pt is up to date with vaccines. Overall, he says, he feel good about his progress.

## 2021-04-10 ENCOUNTER — Other Ambulatory Visit (HOSPITAL_COMMUNITY)
Admission: RE | Admit: 2021-04-10 | Discharge: 2021-04-10 | Disposition: A | Discharge status: Home / Self Care | Payer: Medicare HMO | Source: Ambulatory Visit | Attending: Neurosurgery | Admitting: Neurosurgery

## 2021-04-10 DIAGNOSIS — Z8249 Family history of ischemic heart disease and other diseases of the circulatory system: Secondary | ICD-10-CM | POA: Diagnosis not present

## 2021-04-10 DIAGNOSIS — I739 Peripheral vascular disease, unspecified: Secondary | ICD-10-CM | POA: Diagnosis not present

## 2021-04-10 DIAGNOSIS — Z800 Family history of malignant neoplasm of digestive organs: Secondary | ICD-10-CM | POA: Diagnosis not present

## 2021-04-10 DIAGNOSIS — I129 Hypertensive chronic kidney disease with stage 1 through stage 4 chronic kidney disease, or unspecified chronic kidney disease: Secondary | ICD-10-CM | POA: Diagnosis not present

## 2021-04-10 DIAGNOSIS — M545 Low back pain, unspecified: Secondary | ICD-10-CM | POA: Diagnosis not present

## 2021-04-10 DIAGNOSIS — Z905 Acquired absence of kidney: Secondary | ICD-10-CM | POA: Diagnosis not present

## 2021-04-10 DIAGNOSIS — Z981 Arthrodesis status: Secondary | ICD-10-CM | POA: Diagnosis not present

## 2021-04-10 DIAGNOSIS — Y838 Other surgical procedures as the cause of abnormal reaction of the patient, or of later complication, without mention of misadventure at the time of the procedure: Secondary | ICD-10-CM | POA: Diagnosis present

## 2021-04-10 DIAGNOSIS — Z01812 Encounter for preprocedural laboratory examination: Secondary | ICD-10-CM | POA: Insufficient documentation

## 2021-04-10 DIAGNOSIS — Z96641 Presence of right artificial hip joint: Secondary | ICD-10-CM | POA: Diagnosis present

## 2021-04-10 DIAGNOSIS — K219 Gastro-esophageal reflux disease without esophagitis: Secondary | ICD-10-CM | POA: Diagnosis present

## 2021-04-10 DIAGNOSIS — Z85528 Personal history of other malignant neoplasm of kidney: Secondary | ICD-10-CM | POA: Diagnosis not present

## 2021-04-10 DIAGNOSIS — Z85828 Personal history of other malignant neoplasm of skin: Secondary | ICD-10-CM | POA: Diagnosis not present

## 2021-04-10 DIAGNOSIS — R062 Wheezing: Secondary | ICD-10-CM | POA: Diagnosis not present

## 2021-04-10 DIAGNOSIS — Y828 Other medical devices associated with adverse incidents: Secondary | ICD-10-CM | POA: Diagnosis present

## 2021-04-10 DIAGNOSIS — Z20822 Contact with and (suspected) exposure to covid-19: Secondary | ICD-10-CM | POA: Diagnosis not present

## 2021-04-10 DIAGNOSIS — Z96653 Presence of artificial knee joint, bilateral: Secondary | ICD-10-CM | POA: Diagnosis present

## 2021-04-10 DIAGNOSIS — Z8042 Family history of malignant neoplasm of prostate: Secondary | ICD-10-CM | POA: Diagnosis not present

## 2021-04-10 DIAGNOSIS — T84038A Mechanical loosening of other internal prosthetic joint, initial encounter: Secondary | ICD-10-CM | POA: Diagnosis not present

## 2021-04-10 DIAGNOSIS — Z7982 Long term (current) use of aspirin: Secondary | ICD-10-CM | POA: Diagnosis not present

## 2021-04-10 DIAGNOSIS — Z79899 Other long term (current) drug therapy: Secondary | ICD-10-CM | POA: Diagnosis not present

## 2021-04-10 DIAGNOSIS — I10 Essential (primary) hypertension: Secondary | ICD-10-CM | POA: Diagnosis not present

## 2021-04-10 DIAGNOSIS — M960 Pseudarthrosis after fusion or arthrodesis: Secondary | ICD-10-CM | POA: Diagnosis not present

## 2021-04-10 DIAGNOSIS — N1831 Chronic kidney disease, stage 3a: Secondary | ICD-10-CM | POA: Diagnosis not present

## 2021-04-10 DIAGNOSIS — I2510 Atherosclerotic heart disease of native coronary artery without angina pectoris: Secondary | ICD-10-CM | POA: Diagnosis present

## 2021-04-10 DIAGNOSIS — Z8546 Personal history of malignant neoplasm of prostate: Secondary | ICD-10-CM | POA: Diagnosis not present

## 2021-04-10 LAB — SARS CORONAVIRUS 2 (TAT 6-24 HRS): SARS Coronavirus 2: NEGATIVE

## 2021-04-11 ENCOUNTER — Encounter (HOSPITAL_COMMUNITY): Payer: Self-pay | Admitting: Neurosurgery

## 2021-04-11 ENCOUNTER — Other Ambulatory Visit: Payer: Self-pay

## 2021-04-11 DIAGNOSIS — R062 Wheezing: Secondary | ICD-10-CM | POA: Diagnosis not present

## 2021-04-11 DIAGNOSIS — I129 Hypertensive chronic kidney disease with stage 1 through stage 4 chronic kidney disease, or unspecified chronic kidney disease: Secondary | ICD-10-CM | POA: Diagnosis not present

## 2021-04-11 DIAGNOSIS — N1831 Chronic kidney disease, stage 3a: Secondary | ICD-10-CM | POA: Diagnosis not present

## 2021-04-11 NOTE — Pre-Procedure Instructions (Signed)
Surgical Instructions    Your procedure is scheduled on Thursday, Apr 13, 2021  Report to [facility] Main Entrance "A" at 5:30 A.M., then check in with the Admitting office.  Call this number if you have problems the morning of surgery:  [phone]   If you have any questions prior to your surgery date call [phone]: Open Monday-Friday 8am-4pm    Remember:  Do not eat or drink after midnight the night before your surgery    Take these medicines the morning of surgery with A SIP OF WATER  amLODipine (NORVASC)   Follow your surgeon's instructions on when to stop Aspirin.  If no instructions were given by your surgeon then you will need to call the office to get those instructions.    As of today, STOP taking any Aleve, Naproxen, Ibuprofen, Motrin, Advil, Goody's, BC's, all herbal medications, fish oil, and all vitamins.                     Do NOT Smoke (Tobacco/Vaping) or drink Alcohol 24 hours prior to your procedure.  If you use a CPAP at night, you may bring all equipment for your overnight stay.   Contacts, glasses, piercing's, hearing aid's, dentures or partials may not be worn into surgery, please bring cases for these belongings.    For patients admitted to the hospital, discharge time will be determined by your treatment team.   Patients discharged the day of surgery will not be allowed to drive home, and someone needs to stay with them for 24 hours.  NO VISITORS WILL BE ALLOWED IN PRE-OP WHERE PATIENTS GET READY FOR SURGERY.  ONLY 1 SUPPORT PERSON MAY BE PRESENT IN THE WAITING ROOM WHILE YOU ARE IN SURGERY.  IF YOU ARE TO BE ADMITTED, ONCE YOU ARE IN YOUR ROOM YOU WILL BE ALLOWED TWO (2) VISITORS.  Minor children may have two parents present. Special consideration for safety and communication needs will be reviewed on a case by case basis.   Special instructions:   [place]- Preparing For Surgery  Before surgery, you can play an important role. Because skin is  not sterile, your skin needs to be as free of germs as possible. You can reduce the number of germs on your skin by washing with CHG (chlorahexidine gluconate) Soap before surgery.  CHG is an antiseptic cleaner which kills germs and bonds with the skin to continue killing germs even after washing.    Oral Hygiene is also important to reduce your risk of infection.  Remember - BRUSH YOUR TEETH THE MORNING OF SURGERY WITH YOUR REGULAR TOOTHPASTE  Please do not use if you have an allergy to CHG or antibacterial soaps. If your skin becomes reddened/irritated stop using the CHG.  Do not shave (including legs and underarms) for at least 48 hours prior to first CHG shower. It is OK to shave your face.  Please follow these instructions carefully.   Shower the NIGHT BEFORE SURGERY and the MORNING OF SURGERY  If you chose to wash your hair, wash your hair first as usual with your normal shampoo.  After you shampoo, rinse your hair and body thoroughly to remove the shampoo.  Use CHG Soap as you would any other liquid soap. You can apply CHG directly to the skin and wash gently with a scrungie or a clean washcloth.   Apply the CHG Soap to your body ONLY FROM THE NECK DOWN.  Do not use on open wounds or open  sores. Avoid contact with your eyes, ears, mouth and genitals (private parts). Wash Face and genitals (private parts)  with your normal soap.   Wash thoroughly, paying special attention to the area where your surgery will be performed.  Thoroughly rinse your body with warm water from the neck down.  DO NOT shower/wash with your normal soap after using and rinsing off the CHG Soap.  Pat yourself dry with a CLEAN TOWEL.  Wear CLEAN PAJAMAS to bed the night before surgery  Place CLEAN SHEETS on your bed the night before your surgery  DO NOT SLEEP WITH PETS.   Day of Surgery: Shower with CHG soap. Do not wear jewelry. Do not wear lotions, powders, colognes, or deodorant. Do not shave 48  hours prior to surgery.  Men may shave face and neck. Do not bring valuables to the hospital. [facility] is not responsible for any belongings or valuables. Wear Clean/Comfortable clothing the morning of surgery Remember to brush your teeth WITH YOUR REGULAR TOOTHPASTE.   Please read over the following fact sheets that you were given.   3 days prior to your procedure or After your COVID test   You are not required to quarantine however you are required to wear a well-fitting mask when you are out and around people not in your household. If your mask becomes wet or soiled, replace with a new one.   Wash your hands often with soap and water for 20 seconds or clean your hands with an alcohol-based hand sanitizer that contains at least 60% alcohol.   Do not share personal items.   Notify your provider:  o if you are in close contact with someone who has COVID  o or if you develop a fever of 100.4 or greater, sneezing, cough, sore throat, shortness of breath or body aches.

## 2021-04-13 ENCOUNTER — Inpatient Hospital Stay (HOSPITAL_COMMUNITY): Payer: Medicare HMO | Admitting: Physician Assistant

## 2021-04-13 ENCOUNTER — Inpatient Hospital Stay (HOSPITAL_COMMUNITY): Payer: Medicare HMO | Admitting: Certified Registered Nurse Anesthetist

## 2021-04-13 ENCOUNTER — Encounter (HOSPITAL_COMMUNITY)
Admission: RE | Disposition: A | Discharge status: Home / Self Care | Payer: Self-pay | Source: Home / Self Care | Attending: Neurosurgery

## 2021-04-13 ENCOUNTER — Other Ambulatory Visit: Payer: Self-pay

## 2021-04-13 ENCOUNTER — Encounter (HOSPITAL_COMMUNITY): Payer: Self-pay | Admitting: Neurosurgery

## 2021-04-13 ENCOUNTER — Inpatient Hospital Stay (HOSPITAL_COMMUNITY)
Admission: RE | Admit: 2021-04-13 | Discharge: 2021-04-14 | DRG: 460 | Disposition: A | Discharge status: Home / Self Care | Payer: Medicare HMO | Attending: Neurosurgery | Admitting: Neurosurgery

## 2021-04-13 ENCOUNTER — Inpatient Hospital Stay (HOSPITAL_COMMUNITY): Payer: Medicare HMO

## 2021-04-13 DIAGNOSIS — Z905 Acquired absence of kidney: Secondary | ICD-10-CM

## 2021-04-13 DIAGNOSIS — Z85828 Personal history of other malignant neoplasm of skin: Secondary | ICD-10-CM | POA: Diagnosis not present

## 2021-04-13 DIAGNOSIS — Z8249 Family history of ischemic heart disease and other diseases of the circulatory system: Secondary | ICD-10-CM

## 2021-04-13 DIAGNOSIS — Z96641 Presence of right artificial hip joint: Secondary | ICD-10-CM | POA: Diagnosis present

## 2021-04-13 DIAGNOSIS — Z20822 Contact with and (suspected) exposure to covid-19: Secondary | ICD-10-CM | POA: Diagnosis present

## 2021-04-13 DIAGNOSIS — Y838 Other surgical procedures as the cause of abnormal reaction of the patient, or of later complication, without mention of misadventure at the time of the procedure: Secondary | ICD-10-CM | POA: Diagnosis present

## 2021-04-13 DIAGNOSIS — T84038A Mechanical loosening of other internal prosthetic joint, initial encounter: Secondary | ICD-10-CM | POA: Diagnosis present

## 2021-04-13 DIAGNOSIS — Z8042 Family history of malignant neoplasm of prostate: Secondary | ICD-10-CM | POA: Diagnosis not present

## 2021-04-13 DIAGNOSIS — Z79899 Other long term (current) drug therapy: Secondary | ICD-10-CM | POA: Diagnosis not present

## 2021-04-13 DIAGNOSIS — S32009K Unspecified fracture of unspecified lumbar vertebra, subsequent encounter for fracture with nonunion: Secondary | ICD-10-CM | POA: Diagnosis present

## 2021-04-13 DIAGNOSIS — Z7982 Long term (current) use of aspirin: Secondary | ICD-10-CM

## 2021-04-13 DIAGNOSIS — Y828 Other medical devices associated with adverse incidents: Secondary | ICD-10-CM | POA: Diagnosis present

## 2021-04-13 DIAGNOSIS — Z981 Arthrodesis status: Secondary | ICD-10-CM | POA: Diagnosis not present

## 2021-04-13 DIAGNOSIS — M545 Low back pain, unspecified: Secondary | ICD-10-CM | POA: Diagnosis present

## 2021-04-13 DIAGNOSIS — Z96653 Presence of artificial knee joint, bilateral: Secondary | ICD-10-CM | POA: Diagnosis present

## 2021-04-13 DIAGNOSIS — K219 Gastro-esophageal reflux disease without esophagitis: Secondary | ICD-10-CM | POA: Diagnosis present

## 2021-04-13 DIAGNOSIS — Z800 Family history of malignant neoplasm of digestive organs: Secondary | ICD-10-CM | POA: Diagnosis not present

## 2021-04-13 DIAGNOSIS — Z85528 Personal history of other malignant neoplasm of kidney: Secondary | ICD-10-CM

## 2021-04-13 DIAGNOSIS — M960 Pseudarthrosis after fusion or arthrodesis: Secondary | ICD-10-CM | POA: Diagnosis present

## 2021-04-13 DIAGNOSIS — Z8546 Personal history of malignant neoplasm of prostate: Secondary | ICD-10-CM | POA: Diagnosis not present

## 2021-04-13 DIAGNOSIS — Z419 Encounter for procedure for purposes other than remedying health state, unspecified: Secondary | ICD-10-CM

## 2021-04-13 DIAGNOSIS — I10 Essential (primary) hypertension: Secondary | ICD-10-CM | POA: Diagnosis present

## 2021-04-13 DIAGNOSIS — I2510 Atherosclerotic heart disease of native coronary artery without angina pectoris: Secondary | ICD-10-CM | POA: Diagnosis present

## 2021-04-13 DIAGNOSIS — Z01818 Encounter for other preprocedural examination: Secondary | ICD-10-CM

## 2021-04-13 LAB — CBC
HCT: 39.1 % (ref 39.0–52.0)
Hemoglobin: 13.9 g/dL (ref 13.0–17.0)
MCH: 32.8 pg (ref 26.0–34.0)
MCHC: 35.5 g/dL (ref 30.0–36.0)
MCV: 92.2 fL (ref 80.0–100.0)
Platelets: 143 10*3/uL — ABNORMAL LOW (ref 150–400)
RBC: 4.24 MIL/uL (ref 4.22–5.81)
RDW: 12.7 % (ref 11.5–15.5)
WBC: 3.7 10*3/uL — ABNORMAL LOW (ref 4.0–10.5)
nRBC: 0 % (ref 0.0–0.2)

## 2021-04-13 LAB — BASIC METABOLIC PANEL
Anion gap: 10 (ref 5–15)
BUN: 22 mg/dL (ref 8–23)
CO2: 22 mmol/L (ref 22–32)
Calcium: 9.5 mg/dL (ref 8.9–10.3)
Chloride: 108 mmol/L (ref 98–111)
Creatinine, Ser: 1.51 mg/dL — ABNORMAL HIGH (ref 0.61–1.24)
GFR, Estimated: 48 mL/min — ABNORMAL LOW (ref >60)
Glucose, Bld: 110 mg/dL — ABNORMAL HIGH (ref 70–99)
Potassium: 4.2 mmol/L (ref 3.5–5.1)
Sodium: 140 mmol/L (ref 135–145)

## 2021-04-13 LAB — TYPE AND SCREEN
ABO/RH(D): A POS
Antibody Screen: NEGATIVE

## 2021-04-13 SURGERY — POSTERIOR LUMBAR FUSION 1 LEVEL
Anesthesia: General | Site: Spine Lumbar

## 2021-04-13 MED ORDER — CEFAZOLIN SODIUM-DEXTROSE 2-4 GM/100ML-% IV SOLN
2.0000 g | Freq: Three times a day (TID) | INTRAVENOUS | Status: AC
  Administered 2021-04-13 (×2): 2 g via INTRAVENOUS
  Filled 2021-04-13 (×2): qty 100

## 2021-04-13 MED ORDER — ONDANSETRON HCL 4 MG/2ML IJ SOLN
INTRAMUSCULAR | Status: DC | PRN
  Administered 2021-04-13: 4 mg via INTRAVENOUS

## 2021-04-13 MED ORDER — PROMETHAZINE HCL 25 MG/ML IJ SOLN: 6.2500 mg | INTRAMUSCULAR | Status: DC | PRN

## 2021-04-13 MED ORDER — GABAPENTIN 100 MG PO CAPS
100.0000 mg | ORAL_CAPSULE | Freq: Every day | ORAL | Status: DC
  Administered 2021-04-13: 100 mg via ORAL
  Filled 2021-04-13: qty 1

## 2021-04-13 MED ORDER — MORPHINE SULFATE (PF) 4 MG/ML IV SOLN: 4.0000 mg | INTRAVENOUS | Status: DC | PRN

## 2021-04-13 MED ORDER — CHLORHEXIDINE GLUCONATE CLOTH 2 % EX PADS: 6.0000 | MEDICATED_PAD | Freq: Once | CUTANEOUS | Status: DC

## 2021-04-13 MED ORDER — CEFAZOLIN SODIUM-DEXTROSE 2-4 GM/100ML-% IV SOLN
2.0000 g | INTRAVENOUS | Status: AC
  Administered 2021-04-13: 2 g via INTRAVENOUS
  Filled 2021-04-13: qty 100

## 2021-04-13 MED ORDER — ONDANSETRON HCL 4 MG/2ML IJ SOLN
INTRAMUSCULAR | Status: AC
  Filled 2021-04-13: qty 2

## 2021-04-13 MED ORDER — MIDAZOLAM HCL 2 MG/2ML IJ SOLN
INTRAMUSCULAR | Status: AC
  Filled 2021-04-13: qty 2

## 2021-04-13 MED ORDER — PHENOL 1.4 % MT LIQD: 1.0000 | OROMUCOSAL | Status: DC | PRN

## 2021-04-13 MED ORDER — BUPIVACAINE-EPINEPHRINE (PF) 0.5% -1:200000 IJ SOLN
INTRAMUSCULAR | Status: DC | PRN
Start: 2021-04-13 — End: 2021-04-13
  Administered 2021-04-13: 10 mL

## 2021-04-13 MED ORDER — 0.9 % SODIUM CHLORIDE (POUR BTL) OPTIME
TOPICAL | Status: DC | PRN
  Administered 2021-04-13 (×2): 1000 mL

## 2021-04-13 MED ORDER — ACETAMINOPHEN 500 MG PO TABS
1000.0000 mg | ORAL_TABLET | Freq: Four times a day (QID) | ORAL | Status: AC
  Administered 2021-04-13 – 2021-04-14 (×4): 1000 mg via ORAL
  Filled 2021-04-13 (×4): qty 2

## 2021-04-13 MED ORDER — OXYCODONE HCL 5 MG/5ML PO SOLN: 5.0000 mg | Freq: Once | ORAL | Status: DC | PRN

## 2021-04-13 MED ORDER — ONDANSETRON HCL 4 MG PO TABS: 4.0000 mg | ORAL_TABLET | Freq: Four times a day (QID) | ORAL | Status: DC | PRN

## 2021-04-13 MED ORDER — SODIUM CHLORIDE 0.9% FLUSH: 3.0000 mL | Freq: Two times a day (BID) | INTRAVENOUS | Status: DC

## 2021-04-13 MED ORDER — ROCURONIUM BROMIDE 10 MG/ML (PF) SYRINGE
PREFILLED_SYRINGE | INTRAVENOUS | Status: DC | PRN
Start: 2021-04-13 — End: 2021-04-13
  Administered 2021-04-13 (×2): 20 mg via INTRAVENOUS
  Administered 2021-04-13: 60 mg via INTRAVENOUS

## 2021-04-13 MED ORDER — MIDAZOLAM HCL 5 MG/5ML IJ SOLN: INTRAMUSCULAR | Status: DC | PRN

## 2021-04-13 MED ORDER — HYDROMORPHONE HCL 1 MG/ML IJ SOLN: 0.2500 mg | INTRAMUSCULAR | Status: DC | PRN

## 2021-04-13 MED ORDER — DEXAMETHASONE SODIUM PHOSPHATE 10 MG/ML IJ SOLN
INTRAMUSCULAR | Status: AC
  Filled 2021-04-13: qty 1

## 2021-04-13 MED ORDER — TAMSULOSIN HCL 0.4 MG PO CAPS: 0.8000 mg | ORAL_CAPSULE | Freq: Every day | ORAL | Status: DC

## 2021-04-13 MED ORDER — PHENYLEPHRINE 40 MCG/ML (10ML) SYRINGE FOR IV PUSH (FOR BLOOD PRESSURE SUPPORT)
PREFILLED_SYRINGE | INTRAVENOUS | Status: DC | PRN
Start: 2021-04-13 — End: 2021-04-13
  Administered 2021-04-13 (×5): 80 ug via INTRAVENOUS

## 2021-04-13 MED ORDER — PRAVASTATIN SODIUM 40 MG PO TABS
40.0000 mg | ORAL_TABLET | Freq: Every day | ORAL | Status: DC
  Administered 2021-04-13: 40 mg via ORAL
  Filled 2021-04-13: qty 1

## 2021-04-13 MED ORDER — DOCUSATE SODIUM 100 MG PO CAPS
100.0000 mg | ORAL_CAPSULE | Freq: Two times a day (BID) | ORAL | Status: DC
  Administered 2021-04-13 – 2021-04-14 (×3): 100 mg via ORAL
  Filled 2021-04-13 (×3): qty 1

## 2021-04-13 MED ORDER — FENTANYL CITRATE (PF) 250 MCG/5ML IJ SOLN
INTRAMUSCULAR | Status: DC | PRN
  Administered 2021-04-13 (×2): 50 ug via INTRAVENOUS
  Administered 2021-04-13: 150 ug via INTRAVENOUS

## 2021-04-13 MED ORDER — PHENYLEPHRINE 40 MCG/ML (10ML) SYRINGE FOR IV PUSH (FOR BLOOD PRESSURE SUPPORT)
PREFILLED_SYRINGE | INTRAVENOUS | Status: AC
  Filled 2021-04-13: qty 10

## 2021-04-13 MED ORDER — ACETAMINOPHEN 325 MG PO TABS: 650.0000 mg | ORAL_TABLET | ORAL | Status: DC | PRN

## 2021-04-13 MED ORDER — OXYCODONE HCL 5 MG PO TABS: 5.0000 mg | ORAL_TABLET | Freq: Once | ORAL | Status: DC | PRN

## 2021-04-13 MED ORDER — SUGAMMADEX SODIUM 200 MG/2ML IV SOLN
INTRAVENOUS | Status: DC | PRN
Start: 2021-04-13 — End: 2021-04-13
  Administered 2021-04-13: 200 mg via INTRAVENOUS

## 2021-04-13 MED ORDER — DEXAMETHASONE SODIUM PHOSPHATE 10 MG/ML IJ SOLN
INTRAMUSCULAR | Status: DC | PRN
Start: 2021-04-13 — End: 2021-04-13
  Administered 2021-04-13: 10 mg via INTRAVENOUS

## 2021-04-13 MED ORDER — PROPOFOL 10 MG/ML IV BOLUS
INTRAVENOUS | Status: AC
  Filled 2021-04-13: qty 20

## 2021-04-13 MED ORDER — BACITRACIN ZINC 500 UNIT/GM EX OINT
TOPICAL_OINTMENT | CUTANEOUS | Status: AC
  Filled 2021-04-13: qty 28.35

## 2021-04-13 MED ORDER — FENTANYL CITRATE (PF) 250 MCG/5ML IJ SOLN
INTRAMUSCULAR | Status: AC
  Filled 2021-04-13: qty 5

## 2021-04-13 MED ORDER — PHENYLEPHRINE HCL-NACL 20-0.9 MG/250ML-% IV SOLN
INTRAVENOUS | Status: DC | PRN
  Administered 2021-04-13: 25 ug/min via INTRAVENOUS

## 2021-04-13 MED ORDER — AMLODIPINE BESYLATE 5 MG PO TABS
5.0000 mg | ORAL_TABLET | Freq: Every day | ORAL | Status: DC
  Administered 2021-04-14: 5 mg via ORAL
  Filled 2021-04-13: qty 1

## 2021-04-13 MED ORDER — ACETAMINOPHEN 650 MG RE SUPP: 650.0000 mg | RECTAL | Status: DC | PRN

## 2021-04-13 MED ORDER — PROPOFOL 10 MG/ML IV BOLUS
INTRAVENOUS | Status: DC | PRN
  Administered 2021-04-13: 170 mg via INTRAVENOUS

## 2021-04-13 MED ORDER — THROMBIN 5000 UNITS EX SOLR
OROMUCOSAL | Status: DC | PRN
  Administered 2021-04-13: 5 mL via TOPICAL

## 2021-04-13 MED ORDER — ROCURONIUM BROMIDE 10 MG/ML (PF) SYRINGE
PREFILLED_SYRINGE | INTRAVENOUS | Status: AC
  Filled 2021-04-13: qty 10

## 2021-04-13 MED ORDER — OXYCODONE HCL 5 MG PO TABS: 5.0000 mg | ORAL_TABLET | ORAL | Status: DC | PRN

## 2021-04-13 MED ORDER — CYCLOBENZAPRINE HCL 10 MG PO TABS
10.0000 mg | ORAL_TABLET | Freq: Three times a day (TID) | ORAL | Status: DC | PRN
  Administered 2021-04-13 (×2): 10 mg via ORAL
  Filled 2021-04-13 (×2): qty 1

## 2021-04-13 MED ORDER — BACITRACIN ZINC 500 UNIT/GM EX OINT
TOPICAL_OINTMENT | CUTANEOUS | Status: DC | PRN
Start: 2021-04-13 — End: 2021-04-13
  Administered 2021-04-13: 1 via TOPICAL

## 2021-04-13 MED ORDER — ACETAMINOPHEN 500 MG PO TABS
1000.0000 mg | ORAL_TABLET | Freq: Once | ORAL | Status: AC
  Administered 2021-04-13: 1000 mg via ORAL
  Filled 2021-04-13: qty 2

## 2021-04-13 MED ORDER — LIDOCAINE 2% (20 MG/ML) 5 ML SYRINGE
INTRAMUSCULAR | Status: AC
  Filled 2021-04-13: qty 5

## 2021-04-13 MED ORDER — ORAL CARE MOUTH RINSE: 15.0000 mL | Freq: Once | OROMUCOSAL | Status: AC

## 2021-04-13 MED ORDER — MENTHOL 3 MG MT LOZG: 1.0000 | LOZENGE | OROMUCOSAL | Status: DC | PRN

## 2021-04-13 MED ORDER — POTASSIUM CITRATE ER 10 MEQ (1080 MG) PO TBCR: 20.0000 meq | EXTENDED_RELEASE_TABLET | Freq: Two times a day (BID) | ORAL | Status: DC

## 2021-04-13 MED ORDER — SODIUM CHLORIDE 0.9 % IV SOLN: 250.0000 mL | INTRAVENOUS | Status: DC

## 2021-04-13 MED ORDER — THROMBIN 5000 UNITS EX SOLR
CUTANEOUS | Status: AC
  Filled 2021-04-13: qty 5000

## 2021-04-13 MED ORDER — SODIUM CHLORIDE 0.9% FLUSH: 3.0000 mL | INTRAVENOUS | Status: DC | PRN

## 2021-04-13 MED ORDER — CHLORHEXIDINE GLUCONATE 0.12 % MT SOLN
15.0000 mL | Freq: Once | OROMUCOSAL | Status: AC
  Administered 2021-04-13: 15 mL via OROMUCOSAL
  Filled 2021-04-13: qty 15

## 2021-04-13 MED ORDER — LOSARTAN POTASSIUM 50 MG PO TABS
25.0000 mg | ORAL_TABLET | Freq: Every day | ORAL | Status: DC
  Administered 2021-04-13 – 2021-04-14 (×2): 25 mg via ORAL
  Filled 2021-04-13 (×2): qty 1

## 2021-04-13 MED ORDER — BISACODYL 10 MG RE SUPP: 10.0000 mg | Freq: Every day | RECTAL | Status: DC | PRN

## 2021-04-13 MED ORDER — MIDAZOLAM HCL 2 MG/2ML IJ SOLN: 0.5000 mg | Freq: Once | INTRAMUSCULAR | Status: DC | PRN

## 2021-04-13 MED ORDER — HEMOSTATIC AGENTS (NO CHARGE) OPTIME
TOPICAL | Status: DC | PRN
  Administered 2021-04-13: 1 via TOPICAL

## 2021-04-13 MED ORDER — OXYCODONE HCL 5 MG PO TABS
10.0000 mg | ORAL_TABLET | ORAL | Status: DC | PRN
  Administered 2021-04-13 – 2021-04-14 (×5): 10 mg via ORAL
  Filled 2021-04-13 (×5): qty 2

## 2021-04-13 MED ORDER — LACTATED RINGERS IV SOLN: INTRAVENOUS | Status: DC

## 2021-04-13 MED ORDER — BUPIVACAINE LIPOSOME 1.3 % IJ SUSP
INTRAMUSCULAR | Status: DC | PRN
  Administered 2021-04-13: 20 mL

## 2021-04-13 MED ORDER — EPHEDRINE 5 MG/ML INJ
INTRAVENOUS | Status: AC
  Filled 2021-04-13: qty 5

## 2021-04-13 MED ORDER — BUPIVACAINE-EPINEPHRINE 0.5% -1:200000 IJ SOLN
INTRAMUSCULAR | Status: AC
  Filled 2021-04-13: qty 1

## 2021-04-13 MED ORDER — LIDOCAINE 2% (20 MG/ML) 5 ML SYRINGE
INTRAMUSCULAR | Status: DC | PRN
Start: 2021-04-13 — End: 2021-04-13
  Administered 2021-04-13: 40 mg via INTRAVENOUS

## 2021-04-13 MED ORDER — EPHEDRINE SULFATE-NACL 50-0.9 MG/10ML-% IV SOSY
PREFILLED_SYRINGE | INTRAVENOUS | Status: DC | PRN
Start: 2021-04-13 — End: 2021-04-13
  Administered 2021-04-13: 10 mg via INTRAVENOUS
  Administered 2021-04-13: 15 mg via INTRAVENOUS

## 2021-04-13 MED ORDER — ONDANSETRON HCL 4 MG/2ML IJ SOLN: 4.0000 mg | Freq: Four times a day (QID) | INTRAMUSCULAR | Status: DC | PRN

## 2021-04-13 MED ORDER — BUPIVACAINE LIPOSOME 1.3 % IJ SUSP
INTRAMUSCULAR | Status: AC
  Filled 2021-04-13: qty 20

## 2021-04-13 MED ORDER — TAMSULOSIN HCL 0.4 MG PO CAPS
0.8000 mg | ORAL_CAPSULE | ORAL | Status: AC
  Administered 2021-04-13: 0.8 mg via ORAL
  Filled 2021-04-13: qty 2

## 2021-04-13 SURGICAL SUPPLY — 74 items
ADH SKN CLS APL DERMABOND .7 (GAUZE/BANDAGES/DRESSINGS) ×1
BAG COUNTER SPONGE SURGICOUNT (BAG) ×2 IMPLANT
BAG SPNG CNTER NS LX DISP (BAG) ×1
BASKET BONE COLLECTION (BASKET) ×1 IMPLANT
BENZOIN TINCTURE PRP APPL 2/3 (GAUZE/BANDAGES/DRESSINGS) ×2 IMPLANT
BLADE CLIPPER SURG (BLADE) ×1 IMPLANT
BUR MATCHSTICK NEURO 3.0 LAGG (BURR) ×2 IMPLANT
BUR PRECISION FLUTE 6.0 (BURR) ×2 IMPLANT
CANISTER SUCT 3000ML PPV (MISCELLANEOUS) ×2 IMPLANT
CAP LCK SPNE (Orthopedic Implant) ×9 IMPLANT
CAP LOCK SPINE RADIUS (Orthopedic Implant) IMPLANT
CAP LOCKING (Orthopedic Implant) ×18 IMPLANT
CARTRIDGE OIL MAESTRO DRILL (MISCELLANEOUS) ×1 IMPLANT
CLSR STERI-STRIP ANTIMIC 1/2X4 (GAUZE/BANDAGES/DRESSINGS) ×1 IMPLANT
CNTNR URN SCR LID CUP LEK RST (MISCELLANEOUS) ×1 IMPLANT
CONT SPEC 4OZ STRL OR WHT (MISCELLANEOUS) ×2
COVER BACK TABLE 60X90IN (DRAPES) ×2 IMPLANT
DECANTER SPIKE VIAL GLASS SM (MISCELLANEOUS) ×2 IMPLANT
DERMABOND ADVANCED (GAUZE/BANDAGES/DRESSINGS) ×1
DERMABOND ADVANCED .7 DNX12 (GAUZE/BANDAGES/DRESSINGS) IMPLANT
DIFFUSER DRILL AIR PNEUMATIC (MISCELLANEOUS) ×2 IMPLANT
DRAPE C-ARM 42X72 X-RAY (DRAPES) ×4 IMPLANT
DRAPE HALF SHEET 40X57 (DRAPES) ×2 IMPLANT
DRAPE LAPAROTOMY 100X72X124 (DRAPES) ×2 IMPLANT
DRAPE SURG 17X23 STRL (DRAPES) ×8 IMPLANT
DRSG OPSITE POSTOP 4X6 (GAUZE/BANDAGES/DRESSINGS) ×2 IMPLANT
DRSG OPSITE POSTOP 4X8 (GAUZE/BANDAGES/DRESSINGS) ×1 IMPLANT
ELECT BLADE 4.0 EZ CLEAN MEGAD (MISCELLANEOUS) ×2
ELECT REM PT RETURN 9FT ADLT (ELECTROSURGICAL) ×2
ELECTRODE BLDE 4.0 EZ CLN MEGD (MISCELLANEOUS) ×1 IMPLANT
ELECTRODE REM PT RTRN 9FT ADLT (ELECTROSURGICAL) ×1 IMPLANT
EVACUATOR 1/8 PVC DRAIN (DRAIN) IMPLANT
GAUZE 4X4 16PLY ~~LOC~~+RFID DBL (SPONGE) ×2 IMPLANT
GLOVE EXAM NITRILE XL STR (GLOVE) IMPLANT
GLOVE SURG ENC MOIS LTX SZ8 (GLOVE) ×4 IMPLANT
GLOVE SURG ENC MOIS LTX SZ8.5 (GLOVE) ×4 IMPLANT
GLOVE SURG UNDER POLY LF SZ7 (GLOVE) ×4 IMPLANT
GOWN STRL REUS W/ TWL LRG LVL3 (GOWN DISPOSABLE) IMPLANT
GOWN STRL REUS W/ TWL XL LVL3 (GOWN DISPOSABLE) ×2 IMPLANT
GOWN STRL REUS W/TWL 2XL LVL3 (GOWN DISPOSABLE) IMPLANT
GOWN STRL REUS W/TWL LRG LVL3 (GOWN DISPOSABLE) ×8
GOWN STRL REUS W/TWL XL LVL3 (GOWN DISPOSABLE) ×4
GRAFT BONE PROTEIOS SM 1CC (Orthopedic Implant) ×1 IMPLANT
GRAFT TRIN ELITE MED MUSC TRAN (Graft) ×1 IMPLANT
HEMOSTAT POWDER KIT SURGIFOAM (HEMOSTASIS) ×2 IMPLANT
KIT BASIN OR (CUSTOM PROCEDURE TRAY) ×2 IMPLANT
KIT GRAFTMAG DEL NEURO DISP (NEUROSURGERY SUPPLIES) IMPLANT
KIT TURNOVER KIT B (KITS) ×2 IMPLANT
MILL MEDIUM DISP (BLADE) ×1 IMPLANT
NDL HYPO 21X1.5 SAFETY (NEEDLE) IMPLANT
NEEDLE HYPO 21X1.5 SAFETY (NEEDLE) ×2 IMPLANT
NEEDLE HYPO 22GX1.5 SAFETY (NEEDLE) ×2 IMPLANT
NS IRRIG 1000ML POUR BTL (IV SOLUTION) ×3 IMPLANT
OIL CARTRIDGE MAESTRO DRILL (MISCELLANEOUS) ×2
PACK LAMINECTOMY NEURO (CUSTOM PROCEDURE TRAY) ×2 IMPLANT
PAD ARMBOARD 7.5X6 YLW CONV (MISCELLANEOUS) ×6 IMPLANT
PATTIES SURGICAL .5 X1 (DISPOSABLE) IMPLANT
ROD CURVED 110MM LUMBAR (Rod) ×2 IMPLANT
SCREW SPINAL EVEREST 10.5X45 (Screw) ×2 IMPLANT
SET SCREW (Screw) ×4 IMPLANT
SET SCREW VRST (Screw) IMPLANT
SPONGE NEURO XRAY DETECT 1X3 (DISPOSABLE) ×1 IMPLANT
SPONGE SURGIFOAM ABS GEL 100 (HEMOSTASIS) IMPLANT
SPONGE SURGIFOAM ABS GEL SZ50 (HEMOSTASIS) ×1 IMPLANT
SPONGE T-LAP 4X18 ~~LOC~~+RFID (SPONGE) ×1 IMPLANT
STRIP CLOSURE SKIN 1/2X4 (GAUZE/BANDAGES/DRESSINGS) ×2 IMPLANT
SUT VIC AB 1 CT1 18XBRD ANBCTR (SUTURE) ×2 IMPLANT
SUT VIC AB 1 CT1 8-18 (SUTURE) ×4
SUT VIC AB 2-0 CP2 18 (SUTURE) ×4 IMPLANT
SYR 20ML LL LF (SYRINGE) ×1 IMPLANT
TOWEL GREEN STERILE (TOWEL DISPOSABLE) ×2 IMPLANT
TOWEL GREEN STERILE FF (TOWEL DISPOSABLE) ×2 IMPLANT
TRAY FOLEY MTR SLVR 16FR STAT (SET/KITS/TRAYS/PACK) ×2 IMPLANT
WATER STERILE IRR 1000ML POUR (IV SOLUTION) ×2 IMPLANT

## 2021-04-13 NOTE — Anesthesia Procedure Notes (Signed)
Procedure Name: Intubation Date/Time: 04/13/2021 7:45 AM Performed by: Jenne Campus, CRNA Pre-anesthesia Checklist: Patient identified, Emergency Drugs available, Suction available and Patient being monitored Patient Re-evaluated:Patient Re-evaluated prior to induction Oxygen Delivery Method: Circle System Utilized Preoxygenation: Pre-oxygenation with 100% oxygen Induction Type: IV induction Ventilation: Mask ventilation without difficulty Laryngoscope Size: Miller and 3 Grade View: Grade I Tube type: Oral Tube size: 7.5 mm Number of attempts: 1 Airway Equipment and Method: Stylet and Oral airway Placement Confirmation: ETT inserted through vocal cords under direct vision, positive ETCO2 and breath sounds checked- equal and bilateral Secured at: 22 cm Tube secured with: Tape Dental Injury: Teeth and Oropharynx as per pre-operative assessment

## 2021-04-13 NOTE — Transfer of Care (Signed)
Immediate Anesthesia Transfer of Care Note  Patient: [name]  Procedure(s) Performed: REDO LUMBAR TWO-THREE POSTERIOR ARTHRODESIS AND INSTRUMENTATION, EXPLORE FUSION (Spine Lumbar)  Patient Location: PACU  Anesthesia Type:General  Level of Consciousness: oriented, drowsy and patient cooperative  Airway & Oxygen Therapy: Patient Spontanous Breathing and Patient connected to face mask oxygen  Post-op Assessment: Report given to RN and Post -op Vital signs reviewed and stable  Post vital signs: Reviewed  Last Vitals:  Vitals Value Taken Time  BP 132/78 04/13/21 1056  Temp    Pulse 89 04/13/21 1100  Resp 29 04/13/21 1100  SpO2 98 % 04/13/21 1100  Vitals shown include unvalidated device data.  Last Pain:  Vitals:   04/13/21 0631  TempSrc:   PainSc: 0-No pain         Complications: No notable events documented.

## 2021-04-13 NOTE — Anesthesia Postprocedure Evaluation (Signed)
Anesthesia Post Note  Patient: [name]  Procedure(s) Performed: REDO LUMBAR TWO-THREE POSTERIOR ARTHRODESIS AND INSTRUMENTATION, EXPLORE FUSION (Spine Lumbar)     Patient location during evaluation: PACU Anesthesia Type: General Level of consciousness: awake and alert, patient cooperative and oriented Pain management: pain level controlled Vital Signs Assessment: post-procedure vital signs reviewed and stable Respiratory status: spontaneous breathing, nonlabored ventilation and respiratory function stable Cardiovascular status: blood pressure returned to baseline and stable Postop Assessment: no apparent nausea or vomiting Anesthetic complications: no   No notable events documented.  Last Vitals:  Vitals:   04/13/21 1125 04/13/21 1152  BP: 122/70 125/75  Pulse: 77 84  Resp: 17 20  Temp: 36.6 C   SpO2: 99% 97%    Last Pain:  Vitals:   04/13/21 1200  TempSrc:   PainSc: 0-No pain                 Jaysa Kise,E. Alegria Dominique

## 2021-04-13 NOTE — H&P (Signed)
Subjective: The patient is a 76 year old white male on whom I performed several lumbar fusions.  He has developed recurrent back pain.  He was worked up with x-rays MRI, etc. which demonstrated findings consistent with a lumbar pseudoarthrosis.  I discussed the various treatment options.  He has decided to proceed with surgery.  Past Medical History:  Diagnosis Date   Arthritis 10-22-11   hx. osteoarthritis-knees   Ascending aortic aneurysm    Last CT in July 2012   Coronary artery disease    minimal per cath in 2011   Dyspnea    GERD (gastroesophageal reflux disease)    Heart murmur    History of kidney stones    "YRS AGO"  nothing in last 10 yrs   Hypertension    Lactose intolerance    used to be, not anymore per patient.   Left bundle branch block    Nephrolithiasis    Cancer   Ovarian cancer ([place])    kidney cancer   Prostate cancer ([place])    Skin cancer     Past Surgical History:  Procedure Laterality Date   APPENDECTOMY     BACK SURGERY     BELPHAROPTOSIS REPAIR Bilateral    BUNIONECTOMY Right    CARDIAC CATHETERIZATION  12/22/2009   EF60%/minimal nonobstructive atherosclerotic coronary artery disease/normal lt ventricular function/mod aortic root dilatation   CYSTOSCOPY WITH RETROGRADE PYELOGRAM, URETEROSCOPY AND STENT PLACEMENT Left 07/28/2012   Procedure: CYSTOSCOPY WITH RETROGRADE PYELOGRAM, LEFT URETEROSCOPY ;  Surgeon: Dutch Gray, MD;  Location: [facility];  Service: Urology;  Laterality: Left;   CYSTOSCOPY/RETROGRADE/URETEROSCOPY/STONE EXTRACTION WITH BASKET  10/22/2011   1'12   CYSTOSCOPY/URETEROSCOPY/HOLMIUM LASER/STENT PLACEMENT Right 02/18/2020   Procedure: CYSTOSCOPY/RETROGRADE/URETEROSCOPY/HOLMIUM LASER/STENT PLACEMENT;  Surgeon: Raynelle Bring, MD;  Location: [facility];  Service: Urology;  Laterality: Right;   ELBOW SURGERY Right 1990s?   "tendon repaired"   JOINT REPLACEMENT     both knees & R hip   KNEE ARTHROSCOPY     "? side"   LIPOMA EXCISION Left     wrist   LUMBAR SPINE SURGERY  2003   Lumbar fusion -retained hardware([facility])   PARTIAL NEPHRECTOMY Right 10/2011   skin cancer removal     TONSILLECTOMY     TOTAL HIP ARTHROPLASTY Right 03/26/2017   Procedure: RIGHT TOTAL HIP ARTHROPLASTY ANTERIOR APPROACH;  Surgeon: Renette Butters, MD;  Location: [facility];  Service: Orthopedics;  Laterality: Right;   TOTAL KNEE ARTHROPLASTY     bilateral   TRIGGER FINGER RELEASE Right     Allergies  Allergen Reactions   Tramadol Shortness Of Breath    Shortness of breath and elevated heart rate.    Social History   Tobacco Use   Smoking status: Never   Smokeless tobacco: Never  Substance Use Topics   Alcohol use: No    Family History  Problem Relation Age of Onset   Prostate cancer Father    Pancreatic cancer Father    Heart attack Brother    Breast cancer Neg Hx    Colon cancer Neg Hx    Prior to Admission medications   Medication Sig Start Date End Date Taking? Authorizing Provider  amLODipine (NORVASC) 5 MG tablet TAKE 1 TABLET BY MOUTH EVERY DAY 11/22/20  Yes Martinique, Peter M, MD  aspirin EC 81 MG tablet Take 81 mg by mouth daily.    Yes [provider]  gabapentin (NEURONTIN) 100 MG capsule Take 100 mg by mouth at bedtime. 02/09/21  Yes [provider]  losartan (COZAAR) 100 MG tablet TAKE 1 TABLET (100 MG TOTAL) BY MOUTH DAILY. PATIENT NEEDS OFFICE VISIT FOR FUTURE REFILLS 12/20/20  Yes Martinique, Peter M, MD  Multiple Vitamins-Minerals (MULTIVITAMIN WITH MINERALS) tablet Take 1 tablet by mouth daily.   Yes [provider]  potassium citrate (UROCIT-K) 10 MEQ (1080 MG) SR tablet Take 20 mEq by mouth 2 (two) times daily.   Yes [provider]  pravastatin (PRAVACHOL) 40 MG tablet Take 40 mg by mouth at bedtime.  02/18/17  Yes [provider]  tamsulosin (FLOMAX) 0.4 MG CAPS capsule Take 0.8 mg by mouth at bedtime.  01/03/17  Yes [provider]     Review of Systems  Positive ROS: As  above  All other systems have been reviewed and were otherwise negative with the exception of those mentioned in the HPI and as above.  Objective: Vital signs in last 24 hours: Temp:  [98 F (36.7 C)] 98 F (36.7 C) (01/26 0604) Pulse Rate:  [87] 87 (01/26 0604) Resp:  [17] 17 (01/26 0604) BP: (139)/(77) 139/77 (01/26 0604) SpO2:  [93 %] 93 % (01/26 0604) Weight:  [88.5 kg] 88.5 kg (01/26 0631) Estimated body mass index is 26.82 kg/m as calculated from the following:   Height as of this encounter: 5' 11.5" (1.816 m).   Weight as of this encounter: 88.5 kg.   General Appearance: Alert Head: Normocephalic, without obvious abnormality, atraumatic Eyes: PERRL, conjunctiva/corneas clear, EOM's intact,    Ears: Normal  Throat: Normal  Neck: Supple, Back: The patient's lumbar incision is well-healed. Lungs: Clear to auscultation bilaterally, respirations unlabored Heart: Regular rate and rhythm, no murmur, rub or gallop Abdomen: Soft, non-tender Extremities: Extremities normal, atraumatic, no cyanosis or edema Skin: unremarkable  NEUROLOGIC:   Mental status: alert and oriented,Motor Exam - grossly normal Sensory Exam - grossly normal Reflexes:  Coordination - grossly normal Gait - grossly normal Balance - grossly normal Cranial Nerves: I: smell Not tested  II: visual acuity  OS: Normal  OD: Normal   II: visual fields Full to confrontation  II: pupils Equal, round, reactive to light  III,VII: ptosis None  III,IV,VI: extraocular muscles  Full ROM  V: mastication Normal  V: facial light touch sensation  Normal  V,VII: corneal reflex  Present  VII: facial muscle function - upper  Normal  VII: facial muscle function - lower Normal  VIII: hearing Not tested  IX: soft palate elevation  Normal  IX,X: gag reflex Present  XI: trapezius strength  5/5  XI: sternocleidomastoid strength 5/5  XI: neck flexion strength  5/5  XII: tongue strength  Normal    Data Review Lab  Results  Component Value Date   WBC 3.7 (L) 04/13/2021   HGB 13.9 04/13/2021   HCT 39.1 04/13/2021   MCV 92.2 04/13/2021   PLT 143 (L) 04/13/2021   Lab Results  Component Value Date   NA 140 02/14/2021   K 4.8 02/14/2021   CL 105 02/14/2021   CO2 27 02/14/2021   BUN 33 (H) 02/14/2021   CREATININE 1.69 (H) 02/14/2021   GLUCOSE 166 (H) 02/14/2021   Lab Results  Component Value Date   INR 1.4 09/04/2006    Assessment/Plan: Lumbar pseudoarthrosis, lumbago: I have discussed situation with the patient.  I reviewed his imaging studies with him and pointed out the abnormalities.  We have discussed the various treatment options including surgery.  I described surgical treatment option of a exploration of his lumbar  fusion with an L2-3 redo instrumentation and fusion.  I have shown him surgical models.  I have given him a surgical pamphlet.  We have discussed the risk, benefits, alternatives, expected postop course, and likelihood of achieving our goals with surgery.  I have answered all his questions.  He has decided proceed with surgery.   Ophelia Charter 04/13/2021 7:22 AM

## 2021-04-13 NOTE — Op Note (Signed)
Brief history: The patient is a 76 year old white male on whom I performed several previous lumbar surgeries.  He has developed worsening back pain.  He was worked up with lumbar x-rays and lumbar MRI which demonstrated findings consistent with a lumbar pseudoarthrosis.  I discussed the various treatment options with him.  He has decided proceed with surgery.  Preoperative diagnosis: Lumbar pseudoarthrosis, lumbago  Postoperative diagnosis: The same   Procedure: Exploration of lumbar fusion; removal of lumbar hardware; posterior lateral arthrodesis L2-3 with Trinity bone graft extender and Proteos, posterior segment instrumentation L3-2 S1 with Stryker titanium pedicle screws and rods  Surgeon: Dr. Earle Gell  Asst.: Dr. Consuella Lose and Arnetha Massy, NP  Anesthesia: Gen. endotracheal  Estimated blood loss: 150 cc  Drains: None  Complications: None  Description of procedure: The patient was brought to the operating room by the anesthesia team. General endotracheal anesthesia was induced. The patient was turned to the prone position on the Wilson frame. The patient's lumbosacral region was then prepared with Betadine scrub and Betadine solution. Sterile drapes were applied.  I then injected the area to be incised with Marcaine with epinephrine solution. I then used the scalpel to make a linear midline incision over the L2-3, L3-4, L4-5 and L5-S1 interspace. I then used electrocautery to perform a bilateral subperiosteal dissection exposing the spinous process and lamina of L2 to the upper sacrum, and exposing the old hardware from L2-S1 bilaterally. We then inserted the Verstrac retractor to provide exposure.  We explored the fusion by removing the caps from the old rods and then removing the rods.  We inspected the arthrodesis at L3-4, L4-5 and L5-S1.  It looked good.  The screws at L2 were very loose bilaterally indicative of a pseudoarthrosis at L2-3.  We removed the screws  bilaterally at L2.  We turned our attention to the redo posterior lateral arthrodesis at L2-3.  We used electrocautery to remove the soft tissue from the remainder of the bilateral L2-3 facets, pars, lamina and the bilateral L2 and L3 transverse processes.  We used a high-speed drill to decorticate the structures.  We then laid a combination of Proteos and Trinity bone graft extender over the decorticated posterior lateral structures at L2-3 completing the posterior lateral thesis at L2-3.  We now turned attention to the instrumentation.  We then inserted a 10.5 x 45 millimeter pedicle screw into the 2 pedicles, using the old screw holes. We then palpated along the medial aspect of the pedicles to rule out cortical breaches. There were none. The nerve roots were not injured. We then connected the unilateral pedicle screws with a lordotic rod. We compressed the construct and secured the rod in place with the caps. We then tightened the caps appropriately. This completed the instrumentation from L2-S1 bilaterally.  We then obtained hemostasis using bipolar electrocautery. We irrigated the wound out with saline solution. We then removed the retractor.  We injected Exparel . We reapproximated patient's thoracolumbar fascia with interrupted #1 Vicryl suture. We reapproximated patient's subcutaneous tissue with interrupted 2-0 Vicryl suture. The reapproximated patient's skin with Steri-Strips and benzoin. The wound was then coated with bacitracin ointment. A sterile dressing was applied. The drapes were removed. The patient was subsequently returned to the supine position where they were extubated by the anesthesia team. He was then transported to the post anesthesia care unit in stable condition. All sponge instrument and needle counts were reportedly correct at the end of this case.

## 2021-04-14 LAB — BASIC METABOLIC PANEL
Anion gap: 7 (ref 5–15)
BUN: 22 mg/dL (ref 8–23)
CO2: 23 mmol/L (ref 22–32)
Calcium: 9 mg/dL (ref 8.9–10.3)
Chloride: 109 mmol/L (ref 98–111)
Creatinine, Ser: 1.38 mg/dL — ABNORMAL HIGH (ref 0.61–1.24)
GFR, Estimated: 53 mL/min — ABNORMAL LOW (ref >60)
Glucose, Bld: 121 mg/dL — ABNORMAL HIGH (ref 70–99)
Potassium: 4.9 mmol/L (ref 3.5–5.1)
Sodium: 139 mmol/L (ref 135–145)

## 2021-04-14 LAB — CBC
HCT: 34.7 % — ABNORMAL LOW (ref 39.0–52.0)
Hemoglobin: 12.1 g/dL — ABNORMAL LOW (ref 13.0–17.0)
MCH: 32.2 pg (ref 26.0–34.0)
MCHC: 34.9 g/dL (ref 30.0–36.0)
MCV: 92.3 fL (ref 80.0–100.0)
Platelets: 161 10*3/uL (ref 150–400)
RBC: 3.76 MIL/uL — ABNORMAL LOW (ref 4.22–5.81)
RDW: 12.5 % (ref 11.5–15.5)
WBC: 8.3 10*3/uL (ref 4.0–10.5)
nRBC: 0 % (ref 0.0–0.2)

## 2021-04-14 MED ORDER — OXYCODONE-ACETAMINOPHEN 5-325 MG PO TABS
1.0000 | ORAL_TABLET | ORAL | 0 refills | Status: DC | PRN
Start: 2021-04-14 — End: 2021-08-30

## 2021-04-14 MED ORDER — DOCUSATE SODIUM 100 MG PO CAPS: 100.0000 mg | ORAL_CAPSULE | Freq: Two times a day (BID) | ORAL | 0 refills | Status: AC

## 2021-04-14 MED ORDER — OXYCODONE-ACETAMINOPHEN 5-325 MG PO TABS
1.0000 | ORAL_TABLET | ORAL | Status: DC | PRN
  Administered 2021-04-14: 2 via ORAL
  Filled 2021-04-14: qty 2

## 2021-04-14 MED ORDER — CYCLOBENZAPRINE HCL 10 MG PO TABS: 50.0000 mg | ORAL_TABLET | Freq: Three times a day (TID) | ORAL | 0 refills | Status: DC | PRN

## 2021-04-14 NOTE — Progress Notes (Signed)
Patient alert and oriented, voiding adequately, MAE well with no difficulty. Incision area cdi with no s/s of infection. Patient discharged home per order. Patient and spouse stated understanding of discharge instructions given. Patient has an appointment with Dr Rolena Infante

## 2021-04-14 NOTE — Discharge Summary (Signed)
Physician Discharge Summary  Patient ID: Corey [name] MRN: [id number] DOB/AGE: 1945/06/09 76 y.o.  Admit date: 04/13/2021 Discharge date: 04/14/2021  Admission Diagnoses: Lumbar pseudoarthrosis, lumbago  Discharge Diagnoses: The same Principal Problem:   Lumbar pseudoarthrosis   Discharged Condition: good  Hospital Course: I performed an exploration of the patient's lumbar fusion with a redo L2-3 decompression, instrumentation and fusion on 04/13/2021.  The surgery went well.  The patient's postoperative course was unremarkable.  On postoperative day #1 he requested discharge to home.  He was given verbal and written discharge instructions.  All his questions were answered.  Consults: PT, OT, care management Significant Diagnostic Studies: None Treatments: Exploration of lumbar fusion, redo L2-3 decompression, instrumentation and fusion. Discharge Exam: Blood pressure 139/78, pulse 94, temperature 97.8 F (36.6 C), temperature source Oral, resp. rate 20, height 5' 11.5" (1.816 m), weight 88.5 kg, SpO2 96 %. The patient is alert and pleasant.  He looks well.  His strength is normal.  Disposition: Home  Discharge Instructions     Call MD for:  difficulty breathing, headache or visual disturbances   Complete by: As directed    Call MD for:  extreme fatigue   Complete by: As directed    Call MD for:  hives   Complete by: As directed    Call MD for:  persistant dizziness or light-headedness   Complete by: As directed    Call MD for:  persistant nausea and vomiting   Complete by: As directed    Call MD for:  redness, tenderness, or signs of infection (pain, swelling, redness, odor or green/yellow discharge around incision site)   Complete by: As directed    Call MD for:  severe uncontrolled pain   Complete by: As directed    Call MD for:  temperature >100.4   Complete by: As directed    Diet - low sodium heart healthy   Complete by: As directed    Discharge instructions    Complete by: As directed    Call [phone] for a followup appointment. Take a stool softener while you are using pain medications.   Driving Restrictions   Complete by: As directed    Do not drive for 2 weeks.   Increase activity slowly   Complete by: As directed    Lifting restrictions   Complete by: As directed    Do not lift more than 5 pounds. No excessive bending or twisting.   May shower / Bathe   Complete by: As directed    Remove the dressing for 3 days after surgery.  You may shower, but leave the incision alone.   Remove dressing in 48 hours   Complete by: As directed       Allergies as of 04/14/2021       Reactions   Tramadol Shortness Of Breath   Shortness of breath and elevated heart rate.        Medication List     TAKE these medications    amLODipine 5 MG tablet Commonly known as: NORVASC TAKE 1 TABLET BY MOUTH EVERY DAY   aspirin EC 81 MG tablet Take 81 mg by mouth daily.   cyclobenzaprine 10 MG tablet Commonly known as: FLEXERIL Take 5 tablets (50 mg total) by mouth 3 (three) times daily as needed for muscle spasms.   docusate sodium 100 MG capsule Commonly known as: COLACE Take 1 capsule (100 mg total) by mouth 2 (two) times daily.   gabapentin 100 MG capsule Commonly  known as: NEURONTIN Take 100 mg by mouth at bedtime.   losartan 100 MG tablet Commonly known as: COZAAR TAKE 1 TABLET (100 MG TOTAL) BY MOUTH DAILY. PATIENT NEEDS OFFICE VISIT FOR FUTURE REFILLS   multivitamin with minerals tablet Take 1 tablet by mouth daily.   oxyCODONE-acetaminophen 5-325 MG tablet Commonly known as: PERCOCET/ROXICET Take 1-2 tablets by mouth every 4 (four) hours as needed for moderate pain.   potassium citrate 10 MEQ (1080 MG) SR tablet Commonly known as: UROCIT-K Take 20 mEq by mouth 2 (two) times daily.   pravastatin 40 MG tablet Commonly known as: PRAVACHOL Take 40 mg by mouth at bedtime.   tamsulosin 0.4 MG Caps capsule Commonly known  as: FLOMAX Take 0.8 mg by mouth at bedtime.         Signed: Ophelia Charter 04/14/2021, 8:03 AM

## 2021-04-14 NOTE — Evaluation (Signed)
Physical Therapy Evaluation Patient Details Name: Corey [name] MRN: [id number] DOB: [birth year] Today's Date: 04/14/2021  History of Present Illness  76 y.o. male presents to [facility] hospital on 04/13/2021 with recurrent back pain, found to have lumbar pseudoarthrosis. Pt underwent L2-3 posterior lateral arthrodesis with L3-S1 posterior segment instrumentation. PMH includes OA, AAA, CAD, GERD, HTN, LBBB, prostate cancer.  Clinical Impression  Pt presents to PT s/p lumbar arthrodesis revision without significant deficits in functional mobility. Pt is able to transfer, ambulate, and negotiate steps at a modI or independent level. PT provides education on log roll for bed mobility and brace use. Pt expresses no concerns about his current mobility and is eager to make a return to golfing when cleared by surgeon. Pt has no further acute PT needs. Acute PT signing off.     Recommendations for follow up therapy are one component of a multi-disciplinary discharge planning process, led by the attending physician.  Recommendations may be updated based on patient status, additional functional criteria and insurance authorization.  Follow Up Recommendations No PT follow up    Assistance Recommended at Discharge None  Patient can return home with the following       Equipment Recommendations None recommended by PT  Recommendations for Other Services       Functional Status Assessment Patient has not had a recent decline in their functional status     Precautions / Restrictions Precautions Precautions: Back Precaution Booklet Issued: Yes (comment) Required Braces or Orthoses: Spinal Brace Spinal Brace: Lumbar corset Restrictions Weight Bearing Restrictions: No      Mobility  Bed Mobility                    Transfers Overall transfer level: Independent                      Ambulation/Gait Ambulation/Gait assistance: Independent Gait Distance (Feet): 40 Feet Assistive  device: None Gait Pattern/deviations: WFL(Within Functional Limits) Gait velocity: functional Gait velocity interpretation: >2.62 ft/sec, indicative of community ambulatory      Stairs Stairs: Yes Stairs assistance: Modified independent (Device/Increase time) Stair Management: One rail Left, Step to pattern Number of Stairs: 10    Wheelchair Mobility    Modified Rankin (Stroke Patients Only)       Balance Overall balance assessment: Independent                                           Pertinent Vitals/Pain Pain Assessment Pain Assessment: No/denies pain    Home Living Family/patient expects to be discharged to:: Private residence Living Arrangements: Spouse/significant other Available Help at Discharge: Family Type of Home: House Home Access: Level entry     Alternate Level Stairs-Number of Steps: flight Home Layout: Two level        Prior Function Prior Level of Function : Independent/Modified Independent             Mobility Comments: enjoys golfing but has been unable to play due to low back pain       Hand Dominance        Extremity/Trunk Assessment   Upper Extremity Assessment Upper Extremity Assessment: Overall WFL for tasks assessed    Lower Extremity Assessment Lower Extremity Assessment: Overall WFL for tasks assessed    Cervical / Trunk Assessment Cervical / Trunk Assessment: Back Surgery  Communication   Communication:  No difficulties  Cognition Arousal/Alertness: Awake/alert Behavior During Therapy: WFL for tasks assessed/performed Overall Cognitive Status: Within Functional Limits for tasks assessed                                          General Comments General comments (skin integrity, edema, etc.): VSS on RA    Exercises     Assessment/Plan    PT Assessment Patient does not need any further PT services  PT Problem List         PT Treatment Interventions      PT Goals  (Current goals can be found in the Care Plan section)       Frequency       Co-evaluation               AM-PAC PT "6 Clicks" Mobility  Outcome Measure Help needed turning from your back to your side while in a flat bed without using bedrails?: None Help needed moving from lying on your back to sitting on the side of a flat bed without using bedrails?: None Help needed moving to and from a bed to a chair (including a wheelchair)?: None Help needed standing up from a chair using your arms (e.g., wheelchair or bedside chair)?: None Help needed to walk in hospital room?: None Help needed climbing 3-5 steps with a railing? : None 6 Click Score: 24    End of Session Equipment Utilized During Treatment: Back brace Activity Tolerance: Patient tolerated treatment well Patient left: in chair;with call bell/phone within reach Nurse Communication: Mobility status PT Visit Diagnosis: Other abnormalities of gait and mobility (R26.89)    Time: 6811-5726 PT Time Calculation (min) (ACUTE ONLY): 10 min   Charges:   PT Evaluation $PT Eval Low Complexity: 1 Low          Zenaida Niece, PT, DPT Acute Rehabilitation Pager: [phone] Office [phone]   Zenaida Niece 04/14/2021, 9:35 AM

## 2021-04-14 NOTE — Plan of Care (Signed)
Adequately Ready for discharge

## 2021-04-18 MED FILL — Sodium Chloride IV Soln 0.9%: INTRAVENOUS | Qty: 1000 | Status: AC

## 2021-04-18 MED FILL — Heparin Sodium (Porcine) Inj 1000 Unit/ML: INTRAMUSCULAR | Qty: 30 | Status: AC

## 2021-05-08 DIAGNOSIS — M545 Low back pain, unspecified: Secondary | ICD-10-CM | POA: Diagnosis not present

## 2021-05-08 DIAGNOSIS — S32009K Unspecified fracture of unspecified lumbar vertebra, subsequent encounter for fracture with nonunion: Secondary | ICD-10-CM | POA: Diagnosis not present

## 2021-05-08 DIAGNOSIS — Z981 Arthrodesis status: Secondary | ICD-10-CM | POA: Diagnosis not present

## 2021-05-09 DIAGNOSIS — S32009K Unspecified fracture of unspecified lumbar vertebra, subsequent encounter for fracture with nonunion: Secondary | ICD-10-CM | POA: Diagnosis not present

## 2021-05-23 ENCOUNTER — Other Ambulatory Visit: Payer: Self-pay | Admitting: Cardiology

## 2021-06-06 DIAGNOSIS — Z125 Encounter for screening for malignant neoplasm of prostate: Secondary | ICD-10-CM | POA: Diagnosis not present

## 2021-06-06 DIAGNOSIS — I10 Essential (primary) hypertension: Secondary | ICD-10-CM | POA: Diagnosis not present

## 2021-06-06 DIAGNOSIS — E785 Hyperlipidemia, unspecified: Secondary | ICD-10-CM | POA: Diagnosis not present

## 2021-06-06 DIAGNOSIS — R739 Hyperglycemia, unspecified: Secondary | ICD-10-CM | POA: Diagnosis not present

## 2021-06-13 DIAGNOSIS — C61 Malignant neoplasm of prostate: Secondary | ICD-10-CM | POA: Diagnosis not present

## 2021-06-13 DIAGNOSIS — I129 Hypertensive chronic kidney disease with stage 1 through stage 4 chronic kidney disease, or unspecified chronic kidney disease: Secondary | ICD-10-CM | POA: Diagnosis not present

## 2021-06-13 DIAGNOSIS — R82998 Other abnormal findings in urine: Secondary | ICD-10-CM | POA: Diagnosis not present

## 2021-06-13 DIAGNOSIS — Z1339 Encounter for screening examination for other mental health and behavioral disorders: Secondary | ICD-10-CM | POA: Diagnosis not present

## 2021-06-13 DIAGNOSIS — E785 Hyperlipidemia, unspecified: Secondary | ICD-10-CM | POA: Diagnosis not present

## 2021-06-13 DIAGNOSIS — Z0000 Encounter for general adult medical examination without abnormal findings: Secondary | ICD-10-CM | POA: Diagnosis not present

## 2021-06-13 DIAGNOSIS — N1831 Chronic kidney disease, stage 3a: Secondary | ICD-10-CM | POA: Diagnosis not present

## 2021-06-13 DIAGNOSIS — M179 Osteoarthritis of knee, unspecified: Secondary | ICD-10-CM | POA: Diagnosis not present

## 2021-06-13 DIAGNOSIS — E1151 Type 2 diabetes mellitus with diabetic peripheral angiopathy without gangrene: Secondary | ICD-10-CM | POA: Diagnosis not present

## 2021-06-13 DIAGNOSIS — Z1331 Encounter for screening for depression: Secondary | ICD-10-CM | POA: Diagnosis not present

## 2021-06-19 DIAGNOSIS — Z1212 Encounter for screening for malignant neoplasm of rectum: Secondary | ICD-10-CM | POA: Diagnosis not present

## 2021-06-20 DIAGNOSIS — R69 Illness, unspecified: Secondary | ICD-10-CM | POA: Diagnosis not present

## 2021-07-20 DIAGNOSIS — D225 Melanocytic nevi of trunk: Secondary | ICD-10-CM | POA: Diagnosis not present

## 2021-07-20 DIAGNOSIS — Z85820 Personal history of malignant melanoma of skin: Secondary | ICD-10-CM | POA: Diagnosis not present

## 2021-07-20 DIAGNOSIS — D2239 Melanocytic nevi of other parts of face: Secondary | ICD-10-CM | POA: Diagnosis not present

## 2021-07-20 DIAGNOSIS — L814 Other melanin hyperpigmentation: Secondary | ICD-10-CM | POA: Diagnosis not present

## 2021-07-20 DIAGNOSIS — D3612 Benign neoplasm of peripheral nerves and autonomic nervous system, upper limb, including shoulder: Secondary | ICD-10-CM | POA: Diagnosis not present

## 2021-07-20 DIAGNOSIS — D2272 Melanocytic nevi of left lower limb, including hip: Secondary | ICD-10-CM | POA: Diagnosis not present

## 2021-07-20 DIAGNOSIS — L821 Other seborrheic keratosis: Secondary | ICD-10-CM | POA: Diagnosis not present

## 2021-07-20 DIAGNOSIS — D2271 Melanocytic nevi of right lower limb, including hip: Secondary | ICD-10-CM | POA: Diagnosis not present

## 2021-08-04 IMAGING — PT NM PET TUM IMG SKULL BASE T - THIGH
1 series · 2 of 2 positions shown · non-contrast
Comparison: None.

CLINICAL DATA: Initial staging for prostate carcinoma. High risk
features with PSA equal 4.98 and Gleason score equal 4+4=8.

EXAM:
NUCLEAR MEDICINE PET SKULL BASE TO THIGH
TECHNIQUE: 7.8 mCi F18 Piflufolastat (Pylarify) was injected intravenously.
Full-ring PET imaging was performed from the skull base to thigh
after the radiotracer. CT data was obtained and used for attenuation
correction and anatomic localization.

[Series 1100: results mm oncology reading · 1.0mm · 0.48mm/px · 2 of 2 slices shown]
[im 1/2]
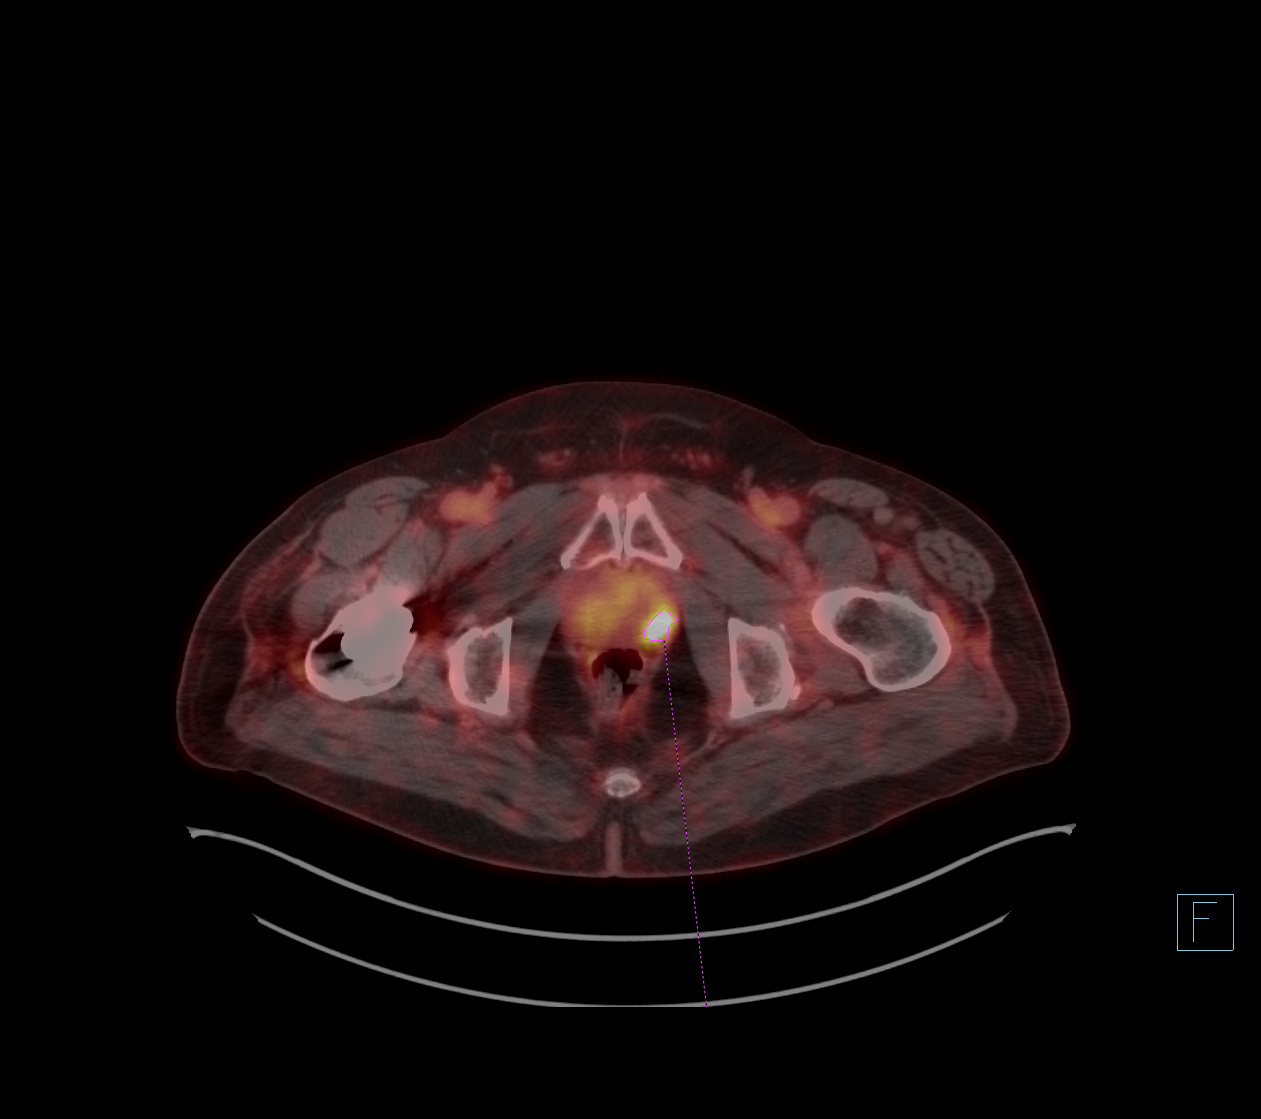
[im 2/2]
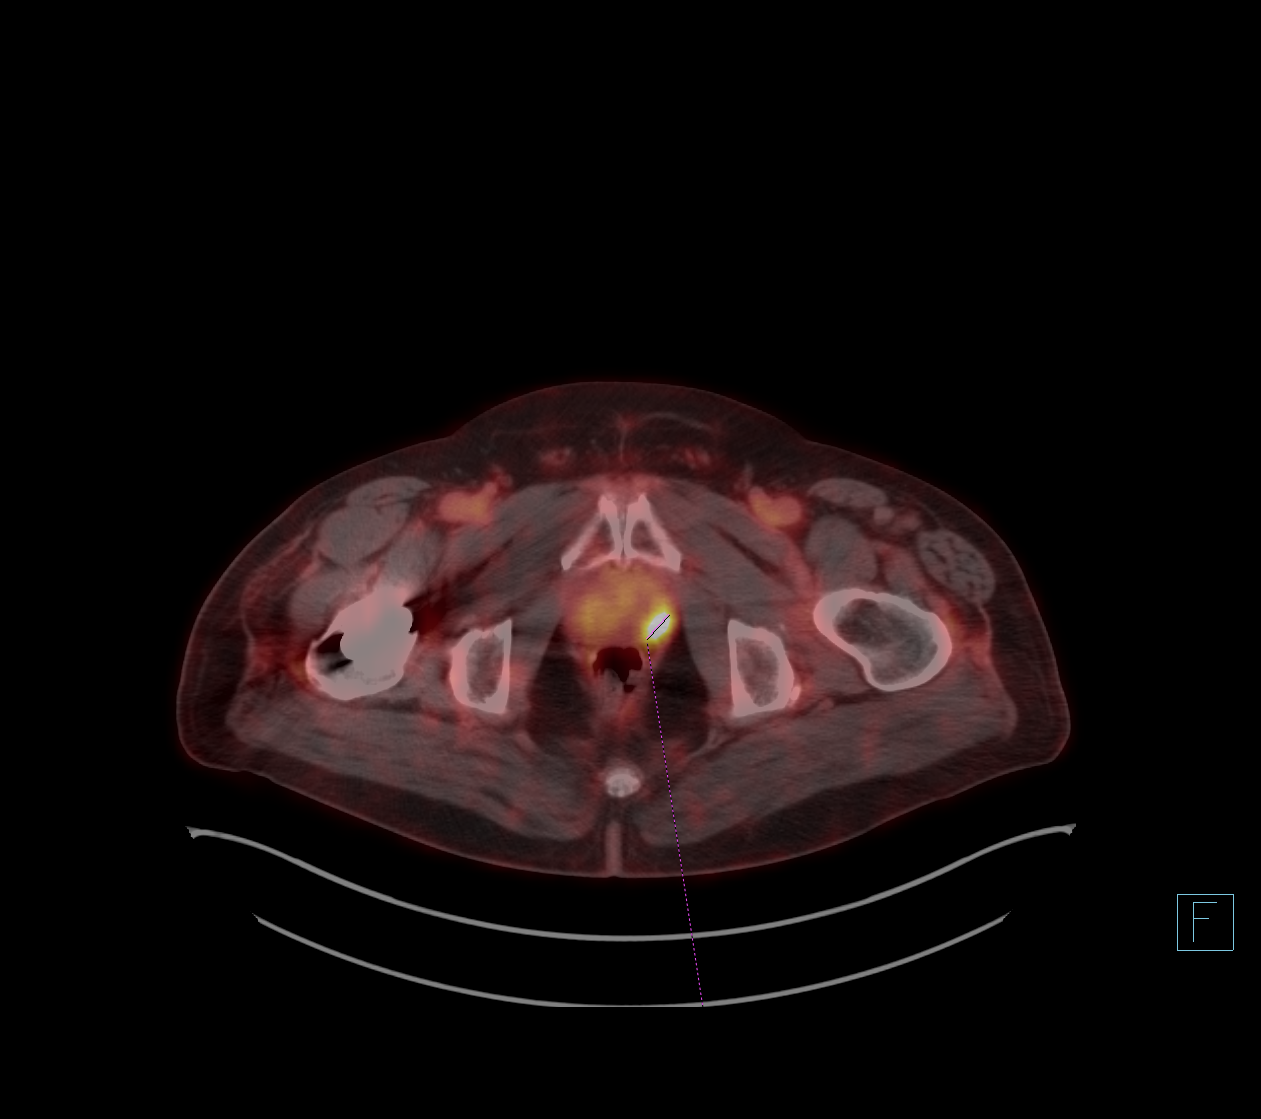

[2 of 2 positions shown; findings below may reference images not displayed]

FINDINGS: NECK

No radiotracer activity in neck lymph nodes.

Incidental CT finding: None

CHEST

No radiotracer accumulation within mediastinal or hilar lymph nodes.
No suspicious pulmonary nodules on the CT scan.

Incidental CT finding: None

ABDOMEN/PELVIS

Prostate: Focal activity in the LEFT gland peripheral zone in the
posterolateral mid gland with SUV max equal 12.0. Lesion measures
approximately 1.5 cm

Lymph nodes: No abnormal radiotracer accumulation within pelvic or
abdominal nodes.

Liver: No evidence of liver metastasis

Incidental CT finding: High-density RIGHT seminal vesicle (image
215/CT series 4) favored post biopsy hemorrhage.

Atherosclerotic calcification of the aorta.

SKELETON

No focal activity to suggest skeletal metastasis. Posterior lumbar
fusion and RIGHT hip prosthetic.
IMPRESSION: 1. Focal radiotracer activity in the LEFT gland peripheral zone
consistent with primary prostate adenocarcinoma.
2. No evidence of metastatic adenopathy in the pelvis or periaortic
retroperitoneum.
3. No visceral metastasis or skeletal metastasis.
4. Post biopsy hemorrhage in the RIGHT seminal vesicle.

## 2021-08-08 DIAGNOSIS — S32009K Unspecified fracture of unspecified lumbar vertebra, subsequent encounter for fracture with nonunion: Secondary | ICD-10-CM | POA: Diagnosis not present

## 2021-08-08 DIAGNOSIS — Z6828 Body mass index (BMI) 28.0-28.9, adult: Secondary | ICD-10-CM | POA: Diagnosis not present

## 2021-08-08 DIAGNOSIS — M545 Low back pain, unspecified: Secondary | ICD-10-CM | POA: Diagnosis not present

## 2021-08-09 DIAGNOSIS — C61 Malignant neoplasm of prostate: Secondary | ICD-10-CM | POA: Diagnosis not present

## 2021-08-16 DIAGNOSIS — C61 Malignant neoplasm of prostate: Secondary | ICD-10-CM | POA: Diagnosis not present

## 2021-08-16 DIAGNOSIS — N401 Enlarged prostate with lower urinary tract symptoms: Secondary | ICD-10-CM | POA: Diagnosis not present

## 2021-08-16 DIAGNOSIS — Z87442 Personal history of urinary calculi: Secondary | ICD-10-CM | POA: Diagnosis not present

## 2021-08-16 DIAGNOSIS — R3912 Poor urinary stream: Secondary | ICD-10-CM | POA: Diagnosis not present

## 2021-08-17 DIAGNOSIS — H5213 Myopia, bilateral: Secondary | ICD-10-CM | POA: Diagnosis not present

## 2021-08-17 DIAGNOSIS — H2513 Age-related nuclear cataract, bilateral: Secondary | ICD-10-CM | POA: Diagnosis not present

## 2021-08-17 DIAGNOSIS — H52203 Unspecified astigmatism, bilateral: Secondary | ICD-10-CM | POA: Diagnosis not present

## 2021-08-26 NOTE — Progress Notes (Unsigned)
Cardiology Office Note    Date:  08/30/2021   ID:  Corey [name], DOB 1945/05/21, MRN [id number]  PCP:  Donnajean Lopes, MD  Cardiologist: Dr. Martinique  Chief Complaint  Patient presents with   Shortness of Breath     History of Present Illness:    Corey [name] is a 76 y.o. male with past medical history of thoracic aortic aneurysm, Stage 3 CKD, chronic LBBB, and minimal CAD by cath in 2011 who presents for follow up   He was seen in 02/2016 and reported having dyspnea on exertion in the warmer temperatures but denied any associated chest discomfort. A repeat CTA was obtained in 03/2016 and showed a stable thoracic aortic aneurysm measuring 4.5cm in diameter with no acute changes (at 4.5 cm in 2015).   He was  seen in Dec 2018 for preoperative cardiac clearance for an  hip replacement. This was performed in January 7867 without complications. Repeat CT chest in February 2019 and March 2021 showed no change in thoracic aneurysm.  In October 2020 he underwent lumbar decompression and fusion for lumbar stenosis with Dr Arnoldo Morale.  In December 2021 he had cystoscopy with removal of right ureteral stone and lithotripsy. Since then has been diagnosed with prostate CA. He did have RT for that. Did get hormonal therapy but states that exhausted him. Now just monitoring PSA.   He was seen in the ED on Nov 28 with complaints of chest pain. CXR showed no change. Ecg showed chronic LBBB. Creatinine improved to 1.69. troponins were negative x 2. Seen today for follow up. He states this pain resolved. No recurrence.  He did undergo lumbar fusion surgery in Jan 2023.  On follow up today he is doing well. He still notes some SOB when he goes up stairs. This has not changed in 5 years. He is still having back pain and also has plantar fasciitis. This has limited his activity. He is trying to lose weight.    Past Medical History:  Diagnosis Date   Arthritis 10-22-11   hx. osteoarthritis-knees    Ascending aortic aneurysm ([place])    Last CT in July 2012   Coronary artery disease    minimal per cath in 2011   Dyspnea    GERD (gastroesophageal reflux disease)    Heart murmur    History of kidney stones    "YRS AGO"  nothing in last 10 yrs   Hypertension    Lactose intolerance    used to be, not anymore per patient.   Left bundle branch block    Nephrolithiasis    Cancer   Ovarian cancer ([place])    kidney cancer   Prostate cancer ([place])    Skin cancer     Past Surgical History:  Procedure Laterality Date   APPENDECTOMY     BACK SURGERY     BELPHAROPTOSIS REPAIR Bilateral    BUNIONECTOMY Right    CARDIAC CATHETERIZATION  12/22/2009   EF60%/minimal nonobstructive atherosclerotic coronary artery disease/normal lt ventricular function/mod aortic root dilatation   CYSTOSCOPY WITH RETROGRADE PYELOGRAM, URETEROSCOPY AND STENT PLACEMENT Left 07/28/2012   Procedure: CYSTOSCOPY WITH RETROGRADE PYELOGRAM, LEFT URETEROSCOPY ;  Surgeon: Dutch Gray, MD;  Location: [facility];  Service: Urology;  Laterality: Left;   CYSTOSCOPY/RETROGRADE/URETEROSCOPY/STONE EXTRACTION WITH BASKET  10/22/2011   1'12   CYSTOSCOPY/URETEROSCOPY/HOLMIUM LASER/STENT PLACEMENT Right 02/18/2020   Procedure: CYSTOSCOPY/RETROGRADE/URETEROSCOPY/HOLMIUM LASER/STENT PLACEMENT;  Surgeon: Raynelle Bring, MD;  Location: [facility];  Service: Urology;  Laterality: Right;  ELBOW SURGERY Right 1990s?   "tendon repaired"   JOINT REPLACEMENT     both knees & R hip   KNEE ARTHROSCOPY     "? side"   LIPOMA EXCISION Left    wrist   LUMBAR SPINE SURGERY  2003   Lumbar fusion -retained hardware([name])   PARTIAL NEPHRECTOMY Right 10/2011   skin cancer removal     TONSILLECTOMY     TOTAL HIP ARTHROPLASTY Right 03/26/2017   Procedure: RIGHT TOTAL HIP ARTHROPLASTY ANTERIOR APPROACH;  Surgeon: Renette Butters, MD;  Location: [facility];  Service: Orthopedics;  Laterality: Right;   TOTAL KNEE ARTHROPLASTY     bilateral   TRIGGER  FINGER RELEASE Right     Current Medications: Outpatient Medications Prior to Visit  Medication Sig Dispense Refill   amLODipine (NORVASC) 5 MG tablet TAKE 1 TABLET BY MOUTH EVERY DAY 90 tablet 2   aspirin EC 81 MG tablet Take 81 mg by mouth daily.      docusate sodium (COLACE) 100 MG capsule Take 1 capsule (100 mg total) by mouth 2 (two) times daily. 60 capsule 0   Empagliflozin (JARDIANCE PO) Take 12 mg by mouth once.     gabapentin (NEURONTIN) 100 MG capsule Take 100 mg by mouth at bedtime.     losartan (COZAAR) 100 MG tablet TAKE 1 TABLET (100 MG TOTAL) BY MOUTH DAILY. PATIENT NEEDS OFFICE VISIT FOR FUTURE REFILLS 90 tablet 3   Multiple Vitamins-Minerals (MULTIVITAMIN WITH MINERALS) tablet Take 1 tablet by mouth daily.     potassium citrate (UROCIT-K) 10 MEQ (1080 MG) SR tablet Take 20 mEq by mouth 2 (two) times daily.     pravastatin (PRAVACHOL) 40 MG tablet Take 40 mg by mouth at bedtime.   3   tamsulosin (FLOMAX) 0.4 MG CAPS capsule Take 0.8 mg by mouth at bedtime.   11   cyclobenzaprine (FLEXERIL) 10 MG tablet Take 5 tablets (50 mg total) by mouth 3 (three) times daily as needed for muscle spasms. (Patient not taking: Reported on 08/30/2021) 30 tablet 0   oxyCODONE-acetaminophen (PERCOCET/ROXICET) 5-325 MG tablet Take 1-2 tablets by mouth every 4 (four) hours as needed for moderate pain. 30 tablet 0   No facility-administered medications prior to visit.     Allergies:   Tramadol   Social History   Socioeconomic History   Marital status: Married    Spouse name: Con Memos   Number of children: 3   Years of education: Not on file   Highest education level: Not on file  Occupational History   Occupation: sales [employer]    Comment: Tree surgeon  Tobacco Use   Smoking status: Never   Smokeless tobacco: Never  Vaping Use   Vaping Use: Never used  Substance and Sexual Activity   Alcohol use: No   Drug use: No   Sexual activity: Yes  Other Topics Concern   Not on file  Social  History Narrative   3 grown children: Corey [name], and Corey [name].   Social Determinants of Health   Financial Resource Strain: Low Risk  (04/04/2021)   Overall Financial Resource Strain (CARDIA)    Difficulty of Paying Living Expenses: Not hard at all  Food Insecurity: No Food Insecurity (04/04/2021)   Hunger Vital Sign    Worried About Running Out of Food in the Last Year: Never true    Ran Out of Food in the Last Year: Never true  Transportation Needs: No Transportation Needs (04/04/2021)   PRAPARE - Transportation  Lack of Transportation (Medical): No    Lack of Transportation (Non-Medical): No  Physical Activity: Sufficiently Active (04/04/2021)   Exercise Vital Sign    Days of Exercise per Week: 3 days    Minutes of Exercise per Session: 60 min  Stress: No Stress Concern Present (04/04/2021)   [place]    Feeling of Stress : Not at all  Social Connections: [place] (04/04/2021)   Social Connection and Isolation Panel [NHANES]    Frequency of Communication with Friends and Family: More than three times a week    Frequency of Social Gatherings with Friends and Family: Three times a week    Attends Religious Services: 1 to 4 times per year    Active Member of Clubs or Organizations: Yes    Attends Archivist Meetings: 1 to 4 times per year    Marital Status: Married     Family History:  The patient's family history includes Cancer in his father; Heart attack in his brother.   Review of Systems:   As noted in HPI. All other systems reviewed and are otherwise negative except as noted above.   Physical Exam:    VS:  BP 113/80 (BP Location: Right Arm, Patient Position: Sitting, Cuff Size: Normal)   Pulse 86   Ht '5\' 11"'$  (1.803 m)   Wt 200 lb 6.4 oz (90.9 kg)   SpO2 93%   BMI 27.95 kg/m    GENERAL:  Well appearing WM in NAD HEENT:  PERRL, EOMI, sclera are clear. Oropharynx is  clear. NECK:  No jugular venous distention, carotid upstroke brisk and symmetric, no bruits, no thyromegaly or adenopathy LUNGS:  Clear to auscultation bilaterally CHEST:  Unremarkable HEART:  RRR,  PMI not displaced or sustained,S1 and S2 within normal limits, no S3, no S4: no clicks, no rubs, gr 5-3/6 systolic murmur RUSB. ABD:  Soft, nontender. BS +, no masses or bruits. No hepatomegaly, no splenomegaly EXT:  2 + pulses throughout, no edema, no cyanosis no clubbing SKIN:  Warm and dry.  No rashes NEURO:  Alert and oriented x 3. Cranial nerves II through XII intact. PSYCH:  Cognitively intact      Wt Readings from Last 3 Encounters:  08/30/21 200 lb 6.4 oz (90.9 kg)  04/13/21 195 lb (88.5 kg)  02/28/21 208 lb (94.3 kg)     Studies/Labs Reviewed:   EKG:  EKG is not ordered today.    Recent Labs: 04/14/2021: BUN 22; Creatinine, Ser 1.38; Hemoglobin 12.1; Platelets 161; Potassium 4.9; Sodium 139   Lipid Panel No results found for: "CHOL", "TRIG", "HDL", "CHOLHDL", "VLDL", "LDLCALC", "LDLDIRECT"   Labs dated 01/31/18: cholesterol 147, triglycerides 130, HDL 44, LDL 77. A1c 5.1%. Creatinine 1.4. otherwise chemistries and Hgb normal.  Dated 02/10/19: cholesterol 136, triglycerides 118, HDL 47, LDL 65. A1c 5.2%. creatinine 1.2. otherwise CMET and CBC normal Dated 02/22/20: cholesterol 128, triglycerides 207, HDL 37, LDL 50. A1c 5.2%. BUN 27, creatinine 2.31. otherwise CMET and CBC normal.  Dated 04/14/21: creatinine 1.38. Hgb 12.1. Dated 06/06/21: cholesterol 146, triglycerides 97, HDL 44, LDL 83. A1c 5.3%. LFTs normal.   Additional studies/ records that were reviewed today include:   Cardiac Catheterization: 2011    Echocardiogram: 07/2013 Study Conclusions  - Left ventricle: Abnormal septal motion. The cavity size was   normal. Wall thickness was increased in a pattern of moderate   LVH. Systolic function was normal. The estimated ejection   fraction  was in the range of 50%  to 55%. Doppler parameters are   consistent with abnormal left ventricular relaxation (grade 1   diastolic dysfunction). - Atrial septum: No defect or patent foramen ovale was identified.   CT ANGIOGRAPHY CHEST WITH CONTRAST   TECHNIQUE: Multidetector CT imaging of the chest was performed using the standard protocol during bolus administration of intravenous contrast. Multiplanar CT image reconstructions and MIPs were obtained to evaluate the vascular anatomy.   CONTRAST:  17m ISOVUE-370 IOPAMIDOL (ISOVUE-370) INJECTION 76%   COMPARISON:  Prior CTA of the chest 04/06/2016   FINDINGS: Cardiovascular: Stable fusiform aneurysmal dilatation of the ascending thoracic aorta of with a transverse diameter 4.5 cm. Conventional 3 vessel arch anatomy. No significant atherosclerotic plaque. The descending and transverse thoracic aorta are normal in caliber. No effacement of the sino-tubular junction. Unremarkable aortic root. The heart is normal in size. No pericardial effusion. Unremarkable pulmonary arteries.   Mediastinum/Nodes: Unremarkable CT appearance of the thyroid gland. No suspicious mediastinal or hilar adenopathy. No soft tissue mediastinal mass. The thoracic esophagus is unremarkable.   Lungs/Pleura: Lungs are clear. No pleural effusion or pneumothorax.   Upper Abdomen: Visualized upper abdominal organs are unremarkable. Stable hyperdense cyst in the upper pole of the right kidney compared to 01/25/2015.   Musculoskeletal: No acute fracture or aggressive appearing lytic or blastic osseous lesion.   Review of the MIP images confirms the above findings.   IMPRESSION: Stable 4.5 cm fusiform ascending thoracic aortic aneurysm. No interval change. Ascending thoracic aortic aneurysm. Recommend semi-annual imaging followup by CTA or MRA and referral to cardiothoracic surgery if not already obtained. This recommendation follows 2010 ACCF/AHA/AATS/ACR/ASA/SCA/SCAI/SIR/STS/SVM  Guidelines for the Diagnosis and Management of Patients With Thoracic Aortic Disease. Circulation. 2010; 121:: T614-E315  Signed,   HCriselda Peaches MD   [facility]   [facility]     Electronically Signed   By: HJacqulynn CadetM.D.   On: 04/29/2017 16:38  CLINICAL DATA:  Follow-up thoracic aortic aneurysm.  CT 04/29/2017   EXAM: CT ANGIOGRAPHY CHEST WITH CONTRAST   TECHNIQUE: Multidetector CT imaging of the chest was performed using the standard protocol during bolus administration of intravenous contrast. Multiplanar CT image reconstructions and MIPs were obtained to evaluate the vascular anatomy.   CONTRAST:  1075mOMNIPAQUE IOHEXOL 350 MG/ML SOLN   COMPARISON:  CT 04/29/2017.   FINDINGS: Cardiovascular: Ascending thoracic aorta measures 4.3 cm in maximum dimension on sagittal image 100/10. This compares to 4.3 cm on comparison exam. Aneurysm measures 4.2 cm on axial image (80/16) and 4.1 cm on coronal imaging.   The descending thoracic aorta is normal caliber at 2.5 cm.   Mediastinum/Nodes: No axillary or supraclavicular adenopathy. No mediastinal hilar adenopathy. No pericardial effusion. Esophagus normal.   Lungs/Pleura: No suspicious pulmonary nodules. Mild subpleural nodularity in the superior aspect of the RIGHT lower lobe (image 60/7 and 67/7) slightly more prominent but favored benign.   Upper Abdomen: Limited view of the liver, kidneys, pancreas are unremarkable. Normal adrenal glands. Stable mixed density cysts in the upper poles of the kidneys.   Musculoskeletal: No aggressive osseous lesion   Review of the MIP images confirms the above findings.   IMPRESSION: 1. Stable ascending thoracic aortic aneurysm at 4.3 cm. Recommend annual imaging followup by CTA or MRA. This recommendation follows 2010 ACCF/AHA/AATS/ACR/ASA/SCA/SCAI/SIR/STS/SVM Guidelines for the Diagnosis and Management of  Patients with Thoracic Aortic Disease. Circulation. 2010; 121: : [id number] aneurysm NOS (ICD10-I71.9). 2. No acute pulmonary  parenchymal findings.     Electronically Signed   By: Suzy Bouchard M.D.   On: 05/21/2019 08:46      Assessment:    1. Hyperlipidemia LDL goal <70   2. Aneurysm of ascending aorta without rupture ([place])   3. LBBB (left bundle branch block)   4. Essential hypertension        Plan:   In order of problems listed above:  1. HTN - BP is well controlled. - continue Amlodipine '5mg'$  daily and Losartan '100mg'$  daily. Avoid use of NSAIDs with CKD.   2. LBBB - chronic for 20+ years. Catheterization in 2011 showed minimal CAD.  - continue ASA and statin therapy.   3. HLD - fRemains on Pravastatin '20mg'$  daily.   4. Thoracic Aortic Aneurysm -  CT in March 2021 showed no change with dimension of 4.3 cm. - aortic dimensions have been stable on CT since 2011. Would suggest we can reduce surveillance to every 3 years.   5. Stage 3 CKD  6.  Prostate CA. S/p RT    Signed, Naquita Nappier Martinique, MD  08/30/2021 11:43 AM    [facility] [address] [place], [place] [address] Phone: [phone]

## 2021-08-30 ENCOUNTER — Encounter: Payer: Self-pay | Admitting: Cardiology

## 2021-08-30 ENCOUNTER — Ambulatory Visit (INDEPENDENT_AMBULATORY_CARE_PROVIDER_SITE_OTHER): Payer: Medicare HMO | Admitting: Cardiology

## 2021-08-30 VITALS — BP 113/80 | HR 86 | Ht 71.0 in | Wt 200.4 lb

## 2021-08-30 DIAGNOSIS — E785 Hyperlipidemia, unspecified: Secondary | ICD-10-CM | POA: Diagnosis not present

## 2021-08-30 DIAGNOSIS — I7121 Aneurysm of the ascending aorta, without rupture: Secondary | ICD-10-CM | POA: Diagnosis not present

## 2021-08-30 DIAGNOSIS — I447 Left bundle-branch block, unspecified: Secondary | ICD-10-CM

## 2021-08-30 DIAGNOSIS — I10 Essential (primary) hypertension: Secondary | ICD-10-CM | POA: Diagnosis not present

## 2021-08-30 NOTE — Patient Instructions (Signed)
Medication Instructions:   No changes  *If you need a refill on your cardiac medications before your next appointment, please call your pharmacy*   Lab Work: Not needed    Testing/Procedures: Not needed   Follow-Up: At [facility], you and your health needs are our priority.  As part of our continuing mission to provide you with exceptional heart care, we have created designated Provider Care Teams.  These Care Teams include your primary Cardiologist (physician) and Advanced Practice Providers (APPs -  Physician Assistants and Nurse Practitioners) who all work together to provide you with the care you need, when you need it.  We recommend signing up for the patient portal called "MyChart".  Sign up information is provided on this After Visit Summary.  MyChart is used to connect with patients for Virtual Visits (Telemedicine).  Patients are able to view lab/test results, encounter notes, upcoming appointments, etc.  Non-urgent messages can be sent to your provider as well.   To learn more about what you can do with MyChart, go to [email or web address].    Your next appointment:   6 month(s)  The format for your next appointment:   In Person  Provider:   Peter Martinique, MD       Important Information About Sugar

## 2022-01-30 DIAGNOSIS — S32009K Unspecified fracture of unspecified lumbar vertebra, subsequent encounter for fracture with nonunion: Secondary | ICD-10-CM | POA: Diagnosis not present

## 2022-01-30 DIAGNOSIS — M5136 Other intervertebral disc degeneration, lumbar region: Secondary | ICD-10-CM | POA: Diagnosis not present

## 2022-02-14 DIAGNOSIS — C61 Malignant neoplasm of prostate: Secondary | ICD-10-CM | POA: Diagnosis not present

## 2022-02-19 ENCOUNTER — Other Ambulatory Visit: Payer: Self-pay | Admitting: Cardiology

## 2022-02-21 DIAGNOSIS — R3912 Poor urinary stream: Secondary | ICD-10-CM | POA: Diagnosis not present

## 2022-02-21 DIAGNOSIS — N401 Enlarged prostate with lower urinary tract symptoms: Secondary | ICD-10-CM | POA: Diagnosis not present

## 2022-02-21 DIAGNOSIS — C61 Malignant neoplasm of prostate: Secondary | ICD-10-CM | POA: Diagnosis not present

## 2022-02-22 NOTE — Progress Notes (Signed)
Cardiology Office Note    Date:  02/26/2022   ID:  Corey [name], DOB 1945-09-03, MRN [id number]  PCP:  Corey Lopes, MD  Cardiologist: Dr. Martinique  Chief Complaint  Patient presents with   Coronary Artery Disease     History of Present Illness:    Corey [name] is a 76 y.o. male with past medical history of thoracic aortic aneurysm, Stage 3 CKD, chronic LBBB, and minimal CAD by cath in 2011 who presents for follow up   He was seen in 02/2016 and reported having dyspnea on exertion in the warmer temperatures but denied any associated chest discomfort. A repeat CTA was obtained in 03/2016 and showed a stable thoracic aortic aneurysm measuring 4.5cm in diameter with no acute changes (at 4.5 cm in 2015).   He was  seen in Dec 2018 for preoperative cardiac clearance for an  hip replacement. This was performed in January 7517 without complications. Repeat CT chest in February 2019 and March 2021 showed no change in thoracic aneurysm.  In October 2020 he underwent lumbar decompression and fusion for lumbar stenosis with Dr Arnoldo Morale.  In December 2021 he had cystoscopy with removal of right ureteral stone and lithotripsy. Since then has been diagnosed with prostate CA. He did have RT for that. Did get hormonal therapy but states that exhausted him. Now just monitoring PSA.   He was seen in the ED on Nov 28 with complaints of chest pain. CXR showed no change. Ecg showed chronic LBBB. Creatinine improved to 1.69. troponins were negative x 2.  He did undergo lumbar fusion surgery in Jan 2023.  On follow up today he is doing well. He still notes some SOB when he mows a lot of grass. This has not changed in 5 years. He walks a 1.5 miles/day up hill. He is trying to lose weight.   Past Medical History:  Diagnosis Date   Arthritis 10-22-11   hx. osteoarthritis-knees   Ascending aortic aneurysm ([place])    Last CT in July 2012   Coronary artery disease    minimal per cath in 2011    Dyspnea    GERD (gastroesophageal reflux disease)    Heart murmur    History of kidney stones    "YRS AGO"  nothing in last 10 yrs   Hypertension    Lactose intolerance    used to be, not anymore per patient.   Left bundle branch block    Nephrolithiasis    Cancer   Ovarian cancer ([place])    kidney cancer   Prostate cancer ([place])    Skin cancer     Past Surgical History:  Procedure Laterality Date   APPENDECTOMY     BACK SURGERY     BELPHAROPTOSIS REPAIR Bilateral    BUNIONECTOMY Right    CARDIAC CATHETERIZATION  12/22/2009   EF60%/minimal nonobstructive atherosclerotic coronary artery disease/normal lt ventricular function/mod aortic root dilatation   CYSTOSCOPY WITH RETROGRADE PYELOGRAM, URETEROSCOPY AND STENT PLACEMENT Left 07/28/2012   Procedure: CYSTOSCOPY WITH RETROGRADE PYELOGRAM, LEFT URETEROSCOPY ;  Surgeon: Dutch Gray, MD;  Location: [facility];  Service: Urology;  Laterality: Left;   CYSTOSCOPY/RETROGRADE/URETEROSCOPY/STONE EXTRACTION WITH BASKET  10/22/2011   1'12   CYSTOSCOPY/URETEROSCOPY/HOLMIUM LASER/STENT PLACEMENT Right 02/18/2020   Procedure: CYSTOSCOPY/RETROGRADE/URETEROSCOPY/HOLMIUM LASER/STENT PLACEMENT;  Surgeon: Raynelle Bring, MD;  Location: [facility];  Service: Urology;  Laterality: Right;   ELBOW SURGERY Right 1990s?   "tendon repaired"   JOINT REPLACEMENT     both knees &  R hip   KNEE ARTHROSCOPY     "? side"   LIPOMA EXCISION Left    wrist   LUMBAR SPINE SURGERY  2003   Lumbar fusion -retained hardware([name])   PARTIAL NEPHRECTOMY Right 10/2011   skin cancer removal     TONSILLECTOMY     TOTAL HIP ARTHROPLASTY Right 03/26/2017   Procedure: RIGHT TOTAL HIP ARTHROPLASTY ANTERIOR APPROACH;  Surgeon: Renette Butters, MD;  Location: [place] [facility];  Service: Orthopedics;  Laterality: Right;   TOTAL KNEE ARTHROPLASTY     bilateral   TRIGGER FINGER RELEASE Right     Current Medications: Outpatient Medications Prior to Visit  Medication Sig Dispense Refill    amLODipine (NORVASC) 5 MG tablet TAKE 1 TABLET BY MOUTH EVERY DAY 90 tablet 2   aspirin EC 81 MG tablet Take 81 mg by mouth daily.      docusate sodium (COLACE) 100 MG capsule Take 1 capsule (100 mg total) by mouth 2 (two) times daily. 60 capsule 0   Empagliflozin (JARDIANCE PO) Take 12 mg by mouth once.     losartan (COZAAR) 100 MG tablet TAKE 1 TABLET (100 MG TOTAL) BY MOUTH DAILY. PATIENT NEEDS OFFICE VISIT FOR FUTURE REFILLS 90 tablet 3   Multiple Vitamins-Minerals (MULTIVITAMIN WITH MINERALS) tablet Take 1 tablet by mouth daily.     potassium citrate (UROCIT-K) 10 MEQ (1080 MG) SR tablet Take 20 mEq by mouth 2 (two) times daily.     pravastatin (PRAVACHOL) 40 MG tablet Take 40 mg by mouth at bedtime.   3   tamsulosin (FLOMAX) 0.4 MG CAPS capsule Take 0.8 mg by mouth at bedtime.   11   gabapentin (NEURONTIN) 100 MG capsule Take 100 mg by mouth at bedtime.     No facility-administered medications prior to visit.     Allergies:   Tramadol   Social History   Socioeconomic History   Marital status: Married    Spouse name: Corey [name]   Number of children: 3   Years of education: Not on file   Highest education level: Not on file  Occupational History   Occupation: sales [employer]    Comment: Tree surgeon  Tobacco Use   Smoking status: Never   Smokeless tobacco: Never  Vaping Use   Vaping Use: Never used  Substance and Sexual Activity   Alcohol use: No   Drug use: No   Sexual activity: Yes  Other Topics Concern   Not on file  Social History Narrative   3 grown children: Corey [name], and Corey [name].   Social Determinants of Health   Financial Resource Strain: Low Risk  (04/04/2021)   Overall Financial Resource Strain (CARDIA)    Difficulty of Paying Living Expenses: Not hard at all  Food Insecurity: No Food Insecurity (04/04/2021)   Hunger Vital Sign    Worried About Running Out of Food in the Last Year: Never true    Ran Out of Food in the Last Year: Never true   Transportation Needs: No Transportation Needs (04/04/2021)   PRAPARE - Hydrologist (Medical): No    Lack of Transportation (Non-Medical): No  Physical Activity: Sufficiently Active (04/04/2021)   Exercise Vital Sign    Days of Exercise per Week: 3 days    Minutes of Exercise per Session: 60 min  Stress: No Stress Concern Present (04/04/2021)   [place]    Feeling of Stress : Not at all  Social Connections: Socially Integrated (04/04/2021)   Social Connection and Isolation Panel [NHANES]    Frequency of Communication with Friends and Family: More than three times a week    Frequency of Social Gatherings with Friends and Family: Three times a week    Attends Religious Services: 1 to 4 times per year    Active Member of Clubs or Organizations: Yes    Attends Archivist Meetings: 1 to 4 times per year    Marital Status: Married     Family History:  The patient's family history includes Cancer in his father; Heart attack in his brother.   Review of Systems:   As noted in HPI. All other systems reviewed and are otherwise negative except as noted above.   Physical Exam:    VS:  BP 116/72   Pulse 63   Ht 5' 11.5" (1.816 m)   Wt 201 lb 6.4 oz (91.4 kg)   SpO2 97%   BMI 27.70 kg/m    GENERAL:  Well appearing WM in NAD HEENT:  PERRL, EOMI, sclera are clear. Oropharynx is clear. NECK:  No jugular venous distention, carotid upstroke brisk and symmetric, no bruits, no thyromegaly or adenopathy LUNGS:  Clear to auscultation bilaterally CHEST:  Unremarkable HEART:  RRR,  PMI not displaced or sustained,S1 and S2 within normal limits, no S3, no S4: no clicks, no rubs, gr 4-0/0 systolic murmur RUSB. ABD:  Soft, nontender. BS +, no masses or bruits. No hepatomegaly, no splenomegaly EXT:  2 + pulses throughout, no edema, no cyanosis no clubbing SKIN:  Warm and dry.  No rashes NEURO:   Alert and oriented x 3. Cranial nerves II through XII intact. PSYCH:  Cognitively intact      Wt Readings from Last 3 Encounters:  02/26/22 201 lb 6.4 oz (91.4 kg)  08/30/21 200 lb 6.4 oz (90.9 kg)  04/13/21 195 lb (88.5 kg)     Studies/Labs Reviewed:   EKG:  EKG is done today. NSR rate 63. LBBB.    Recent Labs: 04/14/2021: BUN 22; Creatinine, Ser 1.38; Hemoglobin 12.1; Platelets 161; Potassium 4.9; Sodium 139   Lipid Panel No results found for: "CHOL", "TRIG", "HDL", "CHOLHDL", "VLDL", "LDLCALC", "LDLDIRECT"   Labs dated 01/31/18: cholesterol 147, triglycerides 130, HDL 44, LDL 77. A1c 5.1%. Creatinine 1.4. otherwise chemistries and Hgb normal.  Dated 02/10/19: cholesterol 136, triglycerides 118, HDL 47, LDL 65. A1c 5.2%. creatinine 1.2. otherwise CMET and CBC normal Dated 02/22/20: cholesterol 128, triglycerides 207, HDL 37, LDL 50. A1c 5.2%. BUN 27, creatinine 2.31. otherwise CMET and CBC normal.  Dated 04/14/21: creatinine 1.38. Hgb 12.1. Dated 06/06/21: cholesterol 146, triglycerides 97, HDL 44, LDL 83. A1c 5.3%. LFTs normal.   Additional studies/ records that were reviewed today include:   Cardiac Catheterization: 2011    Echocardiogram: 07/2013 Study Conclusions  - Left ventricle: Abnormal septal motion. The cavity size was   normal. Wall thickness was increased in a pattern of moderate   LVH. Systolic function was normal. The estimated ejection   fraction was in the range of 50% to 55%. Doppler parameters are   consistent with abnormal left ventricular relaxation (grade 1   diastolic dysfunction). - Atrial septum: No defect or patent foramen ovale was identified.   CT ANGIOGRAPHY CHEST WITH CONTRAST   TECHNIQUE: Multidetector CT imaging of the chest was performed using the standard protocol during bolus administration of intravenous contrast. Multiplanar CT image reconstructions and MIPs were obtained to evaluate the vascular anatomy.  CONTRAST:  23m  ISOVUE-370 IOPAMIDOL (ISOVUE-370) INJECTION 76%   COMPARISON:  Prior CTA of the chest 04/06/2016   FINDINGS: Cardiovascular: Stable fusiform aneurysmal dilatation of the ascending thoracic aorta of with a transverse diameter 4.5 cm. Conventional 3 vessel arch anatomy. No significant atherosclerotic plaque. The descending and transverse thoracic aorta are normal in caliber. No effacement of the sino-tubular junction. Unremarkable aortic root. The heart is normal in size. No pericardial effusion. Unremarkable pulmonary arteries.   Mediastinum/Nodes: Unremarkable CT appearance of the thyroid gland. No suspicious mediastinal or hilar adenopathy. No soft tissue mediastinal mass. The thoracic esophagus is unremarkable.   Lungs/Pleura: Lungs are clear. No pleural effusion or pneumothorax.   Upper Abdomen: Visualized upper abdominal organs are unremarkable. Stable hyperdense cyst in the upper pole of the right kidney compared to 01/25/2015.   Musculoskeletal: No acute fracture or aggressive appearing lytic or blastic osseous lesion.   Review of the MIP images confirms the above findings.   IMPRESSION: Stable 4.5 cm fusiform ascending thoracic aortic aneurysm. No interval change. Ascending thoracic aortic aneurysm. Recommend semi-annual imaging followup by CTA or MRA and referral to cardiothoracic surgery if not already obtained. This recommendation follows 2010 ACCF/AHA/AATS/ACR/ASA/SCA/SCAI/SIR/STS/SVM Guidelines for the Diagnosis and Management of Patients With Thoracic Aortic Disease. Circulation. 2010; 121:: W979-G921  Signed,   HCriselda Peaches MD   [facility]   [facility]     Electronically Signed   By: HJacqulynn CadetM.D.   On: 04/29/2017 16:38  CLINICAL DATA:  Follow-up thoracic aortic aneurysm.  CT 04/29/2017   EXAM: CT ANGIOGRAPHY CHEST WITH CONTRAST   TECHNIQUE: Multidetector CT imaging of the chest was  performed using the standard protocol during bolus administration of intravenous contrast. Multiplanar CT image reconstructions and MIPs were obtained to evaluate the vascular anatomy.   CONTRAST:  1023mOMNIPAQUE IOHEXOL 350 MG/ML SOLN   COMPARISON:  CT 04/29/2017.   FINDINGS: Cardiovascular: Ascending thoracic aorta measures 4.3 cm in maximum dimension on sagittal image 100/10. This compares to 4.3 cm on comparison exam. Aneurysm measures 4.2 cm on axial image (80/16) and 4.1 cm on coronal imaging.   The descending thoracic aorta is normal caliber at 2.5 cm.   Mediastinum/Nodes: No axillary or supraclavicular adenopathy. No mediastinal hilar adenopathy. No pericardial effusion. Esophagus normal.   Lungs/Pleura: No suspicious pulmonary nodules. Mild subpleural nodularity in the superior aspect of the RIGHT lower lobe (image 60/7 and 67/7) slightly more prominent but favored benign.   Upper Abdomen: Limited view of the liver, kidneys, pancreas are unremarkable. Normal adrenal glands. Stable mixed density cysts in the upper poles of the kidneys.   Musculoskeletal: No aggressive osseous lesion   Review of the MIP images confirms the above findings.   IMPRESSION: 1. Stable ascending thoracic aortic aneurysm at 4.3 cm. Recommend annual imaging followup by CTA or MRA. This recommendation follows 2010 ACCF/AHA/AATS/ACR/ASA/SCA/SCAI/SIR/STS/SVM Guidelines for the Diagnosis and Management of Patients with Thoracic Aortic Disease. Circulation. 2010; 121: : [id number] aneurysm NOS (ICD10-I71.9). 2. No acute pulmonary parenchymal findings.     Electronically Signed   By: StSuzy Bouchard.D.   On: 05/21/2019 08:46      Assessment:    1. Aneurysm of ascending aorta without rupture (HCPastura  2. LBBB (left bundle branch block)   3. Essential hypertension   4. Hyperlipidemia LDL goal <70         Plan:   In order of problems listed above:  1. HTN - BP is  well  controlled. - continue Amlodipine '5mg'$  daily and Losartan '100mg'$  daily. Avoid use of NSAIDs with CKD.   2. LBBB - chronic for 20+ years. Catheterization in 2011 showed minimal CAD.  - continue ASA and statin therapy.   3. HLD - fRemains on Pravastatin '20mg'$  daily.   4. Thoracic Aortic Aneurysm -  CT in March 2021 showed no change with dimension of 4.3 cm. - aortic dimensions have been stable on CT since 2011. Would suggest we can reduce surveillance to every 3 years. Will decide about this with next visit.   5. Stage 3 CKD  6.  Prostate CA. S/p RT    Signed, Deniyah Dillavou Martinique, MD  02/26/2022 4:30 PM    [facility] [address] [place], [place] [address] Phone: [phone]

## 2022-02-26 ENCOUNTER — Encounter: Payer: Self-pay | Admitting: Cardiology

## 2022-02-26 ENCOUNTER — Ambulatory Visit: Payer: Medicare HMO | Attending: Cardiology | Admitting: Cardiology

## 2022-02-26 VITALS — BP 116/72 | HR 63 | Ht 71.5 in | Wt 201.4 lb

## 2022-02-26 DIAGNOSIS — I7121 Aneurysm of the ascending aorta, without rupture: Secondary | ICD-10-CM

## 2022-02-26 DIAGNOSIS — I447 Left bundle-branch block, unspecified: Secondary | ICD-10-CM | POA: Diagnosis not present

## 2022-02-26 DIAGNOSIS — E785 Hyperlipidemia, unspecified: Secondary | ICD-10-CM | POA: Diagnosis not present

## 2022-02-26 DIAGNOSIS — I10 Essential (primary) hypertension: Secondary | ICD-10-CM

## 2022-03-06 DIAGNOSIS — C61 Malignant neoplasm of prostate: Secondary | ICD-10-CM | POA: Diagnosis not present

## 2022-03-06 DIAGNOSIS — N1831 Chronic kidney disease, stage 3a: Secondary | ICD-10-CM | POA: Diagnosis not present

## 2022-03-06 DIAGNOSIS — R6883 Chills (without fever): Secondary | ICD-10-CM | POA: Diagnosis not present

## 2022-03-06 DIAGNOSIS — Z1152 Encounter for screening for COVID-19: Secondary | ICD-10-CM | POA: Diagnosis not present

## 2022-03-06 DIAGNOSIS — R197 Diarrhea, unspecified: Secondary | ICD-10-CM | POA: Diagnosis not present

## 2022-03-06 DIAGNOSIS — I129 Hypertensive chronic kidney disease with stage 1 through stage 4 chronic kidney disease, or unspecified chronic kidney disease: Secondary | ICD-10-CM | POA: Diagnosis not present

## 2022-03-06 DIAGNOSIS — E1151 Type 2 diabetes mellitus with diabetic peripheral angiopathy without gangrene: Secondary | ICD-10-CM | POA: Diagnosis not present

## 2022-06-07 DIAGNOSIS — Z09 Encounter for follow-up examination after completed treatment for conditions other than malignant neoplasm: Secondary | ICD-10-CM | POA: Diagnosis not present

## 2022-06-07 DIAGNOSIS — Z86010 Personal history of colonic polyps: Secondary | ICD-10-CM | POA: Diagnosis not present

## 2022-07-24 DIAGNOSIS — D225 Melanocytic nevi of trunk: Secondary | ICD-10-CM | POA: Diagnosis not present

## 2022-07-24 DIAGNOSIS — D0362 Melanoma in situ of left upper limb, including shoulder: Secondary | ICD-10-CM | POA: Diagnosis not present

## 2022-07-24 DIAGNOSIS — D2261 Melanocytic nevi of right upper limb, including shoulder: Secondary | ICD-10-CM | POA: Diagnosis not present

## 2022-07-24 DIAGNOSIS — D2262 Melanocytic nevi of left upper limb, including shoulder: Secondary | ICD-10-CM | POA: Diagnosis not present

## 2022-07-24 DIAGNOSIS — L821 Other seborrheic keratosis: Secondary | ICD-10-CM | POA: Diagnosis not present

## 2022-07-24 DIAGNOSIS — Z85820 Personal history of malignant melanoma of skin: Secondary | ICD-10-CM | POA: Diagnosis not present

## 2022-07-24 DIAGNOSIS — L814 Other melanin hyperpigmentation: Secondary | ICD-10-CM | POA: Diagnosis not present

## 2022-07-24 DIAGNOSIS — D0339 Melanoma in situ of other parts of face: Secondary | ICD-10-CM | POA: Diagnosis not present

## 2022-07-31 DIAGNOSIS — R7989 Other specified abnormal findings of blood chemistry: Secondary | ICD-10-CM | POA: Diagnosis not present

## 2022-07-31 DIAGNOSIS — I10 Essential (primary) hypertension: Secondary | ICD-10-CM | POA: Diagnosis not present

## 2022-07-31 DIAGNOSIS — Z125 Encounter for screening for malignant neoplasm of prostate: Secondary | ICD-10-CM | POA: Diagnosis not present

## 2022-07-31 DIAGNOSIS — K219 Gastro-esophageal reflux disease without esophagitis: Secondary | ICD-10-CM | POA: Diagnosis not present

## 2022-07-31 DIAGNOSIS — S32009K Unspecified fracture of unspecified lumbar vertebra, subsequent encounter for fracture with nonunion: Secondary | ICD-10-CM | POA: Diagnosis not present

## 2022-07-31 DIAGNOSIS — M5136 Other intervertebral disc degeneration, lumbar region: Secondary | ICD-10-CM | POA: Diagnosis not present

## 2022-07-31 DIAGNOSIS — R739 Hyperglycemia, unspecified: Secondary | ICD-10-CM | POA: Diagnosis not present

## 2022-07-31 DIAGNOSIS — E785 Hyperlipidemia, unspecified: Secondary | ICD-10-CM | POA: Diagnosis not present

## 2022-08-06 DIAGNOSIS — D0339 Melanoma in situ of other parts of face: Secondary | ICD-10-CM | POA: Diagnosis not present

## 2022-08-06 DIAGNOSIS — Z85820 Personal history of malignant melanoma of skin: Secondary | ICD-10-CM | POA: Diagnosis not present

## 2022-08-07 DIAGNOSIS — M7742 Metatarsalgia, left foot: Secondary | ICD-10-CM | POA: Diagnosis not present

## 2022-08-07 DIAGNOSIS — I10 Essential (primary) hypertension: Secondary | ICD-10-CM | POA: Diagnosis not present

## 2022-08-07 DIAGNOSIS — E785 Hyperlipidemia, unspecified: Secondary | ICD-10-CM | POA: Diagnosis not present

## 2022-08-07 DIAGNOSIS — E1151 Type 2 diabetes mellitus with diabetic peripheral angiopathy without gangrene: Secondary | ICD-10-CM | POA: Diagnosis not present

## 2022-08-07 DIAGNOSIS — D0339 Melanoma in situ of other parts of face: Secondary | ICD-10-CM | POA: Diagnosis not present

## 2022-08-07 DIAGNOSIS — C61 Malignant neoplasm of prostate: Secondary | ICD-10-CM | POA: Diagnosis not present

## 2022-08-07 DIAGNOSIS — M7741 Metatarsalgia, right foot: Secondary | ICD-10-CM | POA: Diagnosis not present

## 2022-08-07 DIAGNOSIS — L988 Other specified disorders of the skin and subcutaneous tissue: Secondary | ICD-10-CM | POA: Diagnosis not present

## 2022-08-07 DIAGNOSIS — I129 Hypertensive chronic kidney disease with stage 1 through stage 4 chronic kidney disease, or unspecified chronic kidney disease: Secondary | ICD-10-CM | POA: Diagnosis not present

## 2022-08-07 DIAGNOSIS — Z0000 Encounter for general adult medical examination without abnormal findings: Secondary | ICD-10-CM | POA: Diagnosis not present

## 2022-08-07 DIAGNOSIS — I739 Peripheral vascular disease, unspecified: Secondary | ICD-10-CM | POA: Diagnosis not present

## 2022-08-07 DIAGNOSIS — R82998 Other abnormal findings in urine: Secondary | ICD-10-CM | POA: Diagnosis not present

## 2022-08-09 DIAGNOSIS — Z981 Arthrodesis status: Secondary | ICD-10-CM | POA: Diagnosis not present

## 2022-08-09 DIAGNOSIS — M5136 Other intervertebral disc degeneration, lumbar region: Secondary | ICD-10-CM | POA: Diagnosis not present

## 2022-08-09 DIAGNOSIS — Z6827 Body mass index (BMI) 27.0-27.9, adult: Secondary | ICD-10-CM | POA: Diagnosis not present

## 2022-08-14 DIAGNOSIS — Z85820 Personal history of malignant melanoma of skin: Secondary | ICD-10-CM | POA: Diagnosis not present

## 2022-08-14 DIAGNOSIS — D0362 Melanoma in situ of left upper limb, including shoulder: Secondary | ICD-10-CM | POA: Diagnosis not present

## 2022-08-15 DIAGNOSIS — C61 Malignant neoplasm of prostate: Secondary | ICD-10-CM | POA: Diagnosis not present

## 2022-08-24 DIAGNOSIS — N401 Enlarged prostate with lower urinary tract symptoms: Secondary | ICD-10-CM | POA: Diagnosis not present

## 2022-08-24 DIAGNOSIS — R3912 Poor urinary stream: Secondary | ICD-10-CM | POA: Diagnosis not present

## 2022-08-24 DIAGNOSIS — C61 Malignant neoplasm of prostate: Secondary | ICD-10-CM | POA: Diagnosis not present

## 2022-08-28 DIAGNOSIS — M48062 Spinal stenosis, lumbar region with neurogenic claudication: Secondary | ICD-10-CM | POA: Diagnosis not present

## 2022-09-24 ENCOUNTER — Emergency Department (HOSPITAL_BASED_OUTPATIENT_CLINIC_OR_DEPARTMENT_OTHER): Payer: Medicare HMO | Admitting: Radiology

## 2022-09-24 ENCOUNTER — Encounter (HOSPITAL_BASED_OUTPATIENT_CLINIC_OR_DEPARTMENT_OTHER): Payer: Self-pay | Admitting: Emergency Medicine

## 2022-09-24 ENCOUNTER — Observation Stay (HOSPITAL_BASED_OUTPATIENT_CLINIC_OR_DEPARTMENT_OTHER)
Admission: EM | Admit: 2022-09-24 | Discharge: 2022-09-26 | Disposition: A | Discharge status: Home / Self Care | Payer: Medicare HMO | Attending: Internal Medicine | Admitting: Internal Medicine

## 2022-09-24 DIAGNOSIS — Z79899 Other long term (current) drug therapy: Secondary | ICD-10-CM | POA: Diagnosis not present

## 2022-09-24 DIAGNOSIS — R0789 Other chest pain: Secondary | ICD-10-CM | POA: Diagnosis not present

## 2022-09-24 DIAGNOSIS — Z85528 Personal history of other malignant neoplasm of kidney: Secondary | ICD-10-CM | POA: Diagnosis not present

## 2022-09-24 DIAGNOSIS — N1831 Chronic kidney disease, stage 3a: Secondary | ICD-10-CM | POA: Diagnosis present

## 2022-09-24 DIAGNOSIS — Z96641 Presence of right artificial hip joint: Secondary | ICD-10-CM | POA: Insufficient documentation

## 2022-09-24 DIAGNOSIS — Z8546 Personal history of malignant neoplasm of prostate: Secondary | ICD-10-CM | POA: Insufficient documentation

## 2022-09-24 DIAGNOSIS — Z7982 Long term (current) use of aspirin: Secondary | ICD-10-CM | POA: Diagnosis not present

## 2022-09-24 DIAGNOSIS — E785 Hyperlipidemia, unspecified: Secondary | ICD-10-CM | POA: Diagnosis present

## 2022-09-24 DIAGNOSIS — N1832 Chronic kidney disease, stage 3b: Secondary | ICD-10-CM | POA: Diagnosis not present

## 2022-09-24 DIAGNOSIS — Z85828 Personal history of other malignant neoplasm of skin: Secondary | ICD-10-CM | POA: Diagnosis not present

## 2022-09-24 DIAGNOSIS — I2510 Atherosclerotic heart disease of native coronary artery without angina pectoris: Secondary | ICD-10-CM | POA: Insufficient documentation

## 2022-09-24 DIAGNOSIS — N179 Acute kidney failure, unspecified: Secondary | ICD-10-CM | POA: Diagnosis not present

## 2022-09-24 DIAGNOSIS — I10 Essential (primary) hypertension: Secondary | ICD-10-CM | POA: Diagnosis present

## 2022-09-24 DIAGNOSIS — Z96653 Presence of artificial knee joint, bilateral: Secondary | ICD-10-CM | POA: Insufficient documentation

## 2022-09-24 DIAGNOSIS — I129 Hypertensive chronic kidney disease with stage 1 through stage 4 chronic kidney disease, or unspecified chronic kidney disease: Secondary | ICD-10-CM | POA: Insufficient documentation

## 2022-09-24 DIAGNOSIS — R079 Chest pain, unspecified: Principal | ICD-10-CM | POA: Diagnosis present

## 2022-09-24 DIAGNOSIS — C61 Malignant neoplasm of prostate: Secondary | ICD-10-CM | POA: Diagnosis present

## 2022-09-24 DIAGNOSIS — N183 Chronic kidney disease, stage 3 unspecified: Secondary | ICD-10-CM | POA: Diagnosis present

## 2022-09-24 HISTORY — DX: Personal history of other malignant neoplasm of kidney: Z85.528

## 2022-09-24 LAB — TROPONIN I (HIGH SENSITIVITY): Troponin I (High Sensitivity): 6 ng/L (ref <18)

## 2022-09-24 LAB — BASIC METABOLIC PANEL
Anion gap: 9 (ref 5–15)
BUN: 32 mg/dL — ABNORMAL HIGH (ref 8–23)
CO2: 23 mmol/L (ref 22–32)
Calcium: 9 mg/dL (ref 8.9–10.3)
Chloride: 106 mmol/L (ref 98–111)
Creatinine, Ser: 1.63 mg/dL — ABNORMAL HIGH (ref 0.61–1.24)
GFR, Estimated: 43 mL/min — ABNORMAL LOW (ref >60)
Glucose, Bld: 115 mg/dL — ABNORMAL HIGH (ref 70–99)
Potassium: 4.7 mmol/L (ref 3.5–5.1)
Sodium: 138 mmol/L (ref 135–145)

## 2022-09-24 LAB — CBC
HCT: 43.9 % (ref 39.0–52.0)
Hemoglobin: 15.1 g/dL (ref 13.0–17.0)
MCH: 32.3 pg (ref 26.0–34.0)
MCHC: 34.4 g/dL (ref 30.0–36.0)
MCV: 94 fL (ref 80.0–100.0)
Platelets: 181 10*3/uL (ref 150–400)
RBC: 4.67 MIL/uL (ref 4.22–5.81)
RDW: 12.5 % (ref 11.5–15.5)
WBC: 4.7 10*3/uL (ref 4.0–10.5)
nRBC: 0 % (ref 0.0–0.2)

## 2022-09-24 MED ORDER — NITROGLYCERIN 0.4 MG SL SUBL
0.4000 mg | SUBLINGUAL_TABLET | SUBLINGUAL | Status: DC | PRN
  Administered 2022-09-25: 0.4 mg via SUBLINGUAL
  Filled 2022-09-24: qty 1

## 2022-09-24 NOTE — ED Triage Notes (Signed)
Last week left sided chest pain "pressure"  Again started today after working out.  Pain was resolved with nitroglycerin tablet but returned   Denies sob, no n/v

## 2022-09-24 NOTE — ED Provider Notes (Signed)
[place] EMERGENCY DEPARTMENT AT [facility] Provider Note   CSN: [id number] Arrival date & time: 09/24/22  2136     History  No chief complaint on file.   Corey [name] is a 77 y.o. male.  Patient is a 77 year old male with past medical history of hypertension, thoracic aortic aneurysm, GERD, chronic renal insufficiency.  Patient presenting today with complaints of chest discomfort.  This has been ongoing off-and-on for the past week.  Symptoms seem to come on when he exercises at the [facility] and improves with rest.  This afternoon, symptoms came on after his workout, then were relieved with a sublingual nitroglycerin he borrowed from his wife.  Symptoms recurred again this evening while at rest and again were relieved with nitroglycerin.  He describes a pressure to the top of his chest with no associated nausea, diaphoresis, or radiation to the arm or jaw.  He does feel somewhat short of breath when the discomfort is there.  Patient has no prior cardiac history.  He tells me many years ago he was found to have a left bundle branch block and underwent workup for this, but everything else seemed fine.  The history is provided by the patient.       Home Medications Prior to Admission medications   Medication Sig Start Date End Date Taking? Authorizing Provider  amLODipine (NORVASC) 5 MG tablet TAKE 1 TABLET BY MOUTH EVERY DAY 05/24/21   [place], Peter M, MD  aspirin EC 81 MG tablet Take 81 mg by mouth daily.     [provider]  docusate sodium (COLACE) 100 MG capsule Take 1 capsule (100 mg total) by mouth 2 (two) times daily. 04/14/21   Tressie Stalker, MD  Empagliflozin (JARDIANCE PO) Take 12 mg by mouth once.    [provider]  losartan (COZAAR) 100 MG tablet TAKE 1 TABLET (100 MG TOTAL) BY MOUTH DAILY. PATIENT NEEDS OFFICE VISIT FOR FUTURE REFILLS 02/20/22   [place], Peter M, MD  Multiple Vitamins-Minerals (MULTIVITAMIN WITH MINERALS) tablet Take 1 tablet by  mouth daily.    [provider]  potassium citrate (UROCIT-K) 10 MEQ (1080 MG) SR tablet Take 20 mEq by mouth 2 (two) times daily.    [provider]  pravastatin (PRAVACHOL) 40 MG tablet Take 40 mg by mouth at bedtime.  02/18/17   [provider]  tamsulosin (FLOMAX) 0.4 MG CAPS capsule Take 0.8 mg by mouth at bedtime.  01/03/17   [provider]      Allergies    Tramadol    Review of Systems   Review of Systems  All other systems reviewed and are negative.   Physical Exam Updated Vital Signs BP 118/75   Pulse 62   Temp 98.4 F (36.9 C)   Resp 15   SpO2 96%  Physical Exam Vitals and nursing note reviewed.  Constitutional:      General: He is not in acute distress.    Appearance: He is well-developed. He is not diaphoretic.  HENT:     Head: Normocephalic and atraumatic.  Cardiovascular:     Rate and Rhythm: Normal rate and regular rhythm.     Heart sounds: No murmur heard.    No friction rub.  Pulmonary:     Effort: Pulmonary effort is normal. No respiratory distress.     Breath sounds: Normal breath sounds. No wheezing or rales.  Abdominal:     General: Bowel sounds are normal. There is no distension.  Palpations: Abdomen is soft.     Tenderness: There is no abdominal tenderness.  Musculoskeletal:        General: No swelling. Normal range of motion.     Cervical back: Normal range of motion and neck supple.     Right lower leg: No edema.     Left lower leg: No edema.  Skin:    General: Skin is warm and dry.  Neurological:     Mental Status: He is alert and oriented to person, place, and time.     Coordination: Coordination normal.     ED Results / Procedures / Treatments   Labs (all labs ordered are listed, but only abnormal results are displayed) Labs Reviewed  BASIC METABOLIC PANEL - Abnormal; Notable for the following components:      Result Value   Glucose, Bld 115 (*)    BUN 32 (*)    Creatinine, Ser 1.63 (*)     GFR, Estimated 43 (*)    All other components within normal limits  CBC  TROPONIN I (HIGH SENSITIVITY)  TROPONIN I (HIGH SENSITIVITY)    EKG EKG Interpretation Date/Time:  Monday September 24 2022 21:43:53 EDT Ventricular Rate:  75 PR Interval:  182 QRS Duration:  172 QT Interval:  448 QTC Calculation: 500 R Axis:   -30  Text Interpretation: Normal sinus rhythm Left axis deviation Left bundle branch block Abnormal ECG No significant change since 02/14/2021 Confirmed by Geoffery Lyons ([id number]) on 09/24/2022 11:19:29 PM  Radiology DG Chest 2 View  Result Date: 09/24/2022 CLINICAL DATA:  Chest pain EXAM: CHEST - 2 VIEW COMPARISON:  02/14/2021 FINDINGS: The heart size and mediastinal contours are within normal limits. Both lungs are clear. The visualized skeletal structures are unremarkable. IMPRESSION: No active cardiopulmonary disease. Electronically Signed   By: Jasmine Pang M.D.   On: 09/24/2022 22:08    Procedures Procedures  {Document cardiac monitor, telemetry assessment procedure when appropriate:1}  Medications Ordered in ED Medications - No data to display  ED Course/ Medical Decision Making/ A&P   {   Click here for ABCD2, HEART and other calculatorsREFRESH Note before signing :1}                          Medical Decision Making Amount and/or Complexity of Data Reviewed Labs: ordered. Radiology: ordered.   ***  {Document critical care time when appropriate:1} {Document review of labs and clinical decision tools ie heart score, Chads2Vasc2 etc:1}  {Document your independent review of radiology images, and any outside records:1} {Document your discussion with family members, caretakers, and with consultants:1} {Document social determinants of health affecting pt's care:1} {Document your decision making why or why not admission, treatments were needed:1} Final Clinical Impression(s) / ED Diagnoses Final diagnoses:  None    Rx / DC Orders ED Discharge Orders      None

## 2022-09-25 ENCOUNTER — Observation Stay (HOSPITAL_BASED_OUTPATIENT_CLINIC_OR_DEPARTMENT_OTHER): Payer: Medicare HMO

## 2022-09-25 ENCOUNTER — Encounter (HOSPITAL_COMMUNITY): Payer: Self-pay | Admitting: Internal Medicine

## 2022-09-25 ENCOUNTER — Other Ambulatory Visit: Payer: Self-pay

## 2022-09-25 DIAGNOSIS — I7121 Aneurysm of the ascending aorta, without rupture: Secondary | ICD-10-CM

## 2022-09-25 DIAGNOSIS — N179 Acute kidney failure, unspecified: Secondary | ICD-10-CM | POA: Diagnosis not present

## 2022-09-25 DIAGNOSIS — Z85528 Personal history of other malignant neoplasm of kidney: Secondary | ICD-10-CM | POA: Diagnosis not present

## 2022-09-25 DIAGNOSIS — Z85828 Personal history of other malignant neoplasm of skin: Secondary | ICD-10-CM | POA: Diagnosis not present

## 2022-09-25 DIAGNOSIS — R079 Chest pain, unspecified: Secondary | ICD-10-CM

## 2022-09-25 DIAGNOSIS — Z96641 Presence of right artificial hip joint: Secondary | ICD-10-CM | POA: Diagnosis not present

## 2022-09-25 DIAGNOSIS — I2510 Atherosclerotic heart disease of native coronary artery without angina pectoris: Secondary | ICD-10-CM | POA: Diagnosis not present

## 2022-09-25 DIAGNOSIS — I10 Essential (primary) hypertension: Secondary | ICD-10-CM

## 2022-09-25 DIAGNOSIS — Z8546 Personal history of malignant neoplasm of prostate: Secondary | ICD-10-CM | POA: Diagnosis not present

## 2022-09-25 DIAGNOSIS — Z7982 Long term (current) use of aspirin: Secondary | ICD-10-CM | POA: Diagnosis not present

## 2022-09-25 DIAGNOSIS — N1832 Chronic kidney disease, stage 3b: Secondary | ICD-10-CM

## 2022-09-25 DIAGNOSIS — Z79899 Other long term (current) drug therapy: Secondary | ICD-10-CM | POA: Diagnosis not present

## 2022-09-25 DIAGNOSIS — Z96653 Presence of artificial knee joint, bilateral: Secondary | ICD-10-CM | POA: Diagnosis not present

## 2022-09-25 DIAGNOSIS — I129 Hypertensive chronic kidney disease with stage 1 through stage 4 chronic kidney disease, or unspecified chronic kidney disease: Secondary | ICD-10-CM | POA: Diagnosis not present

## 2022-09-25 DIAGNOSIS — E785 Hyperlipidemia, unspecified: Secondary | ICD-10-CM | POA: Diagnosis present

## 2022-09-25 LAB — ECHOCARDIOGRAM COMPLETE
AR max vel: 2.17 cm2
AV Area VTI: 2.1 cm2
AV Area mean vel: 2.2 cm2
AV Mean grad: 5 mmHg
AV Peak grad: 8.9 mmHg
AV Vena cont: 0.3 cm
Ao pk vel: 1.49 m/s
Area-P 1/2: 6.27 cm2
Calc EF: 59.4 %
Height: 71 in
MV M vel: 5 m/s
MV Peak grad: 100 mmHg
P 1/2 time: 1493 msec
S' Lateral: 3.3 cm
Single Plane A2C EF: 58.8 %
Single Plane A4C EF: 59.1 %
Weight: 3160 oz

## 2022-09-25 LAB — TROPONIN I (HIGH SENSITIVITY): Troponin I (High Sensitivity): 6 ng/L (ref <18)

## 2022-09-25 MED ORDER — AMLODIPINE BESYLATE 5 MG PO TABS
5.0000 mg | ORAL_TABLET | Freq: Every day | ORAL | Status: DC
  Administered 2022-09-26: 5 mg via ORAL
  Filled 2022-09-25: qty 1

## 2022-09-25 MED ORDER — POTASSIUM CHLORIDE CRYS ER 20 MEQ PO TBCR: 20.0000 meq | EXTENDED_RELEASE_TABLET | Freq: Two times a day (BID) | ORAL | Status: DC

## 2022-09-25 MED ORDER — NITROGLYCERIN 0.4 MG SL SUBL: 0.4000 mg | SUBLINGUAL_TABLET | SUBLINGUAL | Status: DC | PRN

## 2022-09-25 MED ORDER — SODIUM CHLORIDE 0.9% FLUSH: 3.0000 mL | INTRAVENOUS | Status: DC | PRN

## 2022-09-25 MED ORDER — TAMSULOSIN HCL 0.4 MG PO CAPS
0.8000 mg | ORAL_CAPSULE | Freq: Every day | ORAL | Status: DC
  Administered 2022-09-25: 0.8 mg via ORAL
  Filled 2022-09-25: qty 2

## 2022-09-25 MED ORDER — SODIUM CHLORIDE 0.9% FLUSH
3.0000 mL | Freq: Two times a day (BID) | INTRAVENOUS | Status: DC
  Administered 2022-09-25 – 2022-09-26 (×2): 3 mL via INTRAVENOUS

## 2022-09-25 MED ORDER — METOPROLOL TARTRATE 12.5 MG HALF TABLET
12.5000 mg | ORAL_TABLET | Freq: Two times a day (BID) | ORAL | Status: DC
  Administered 2022-09-25 – 2022-09-26 (×3): 12.5 mg via ORAL
  Filled 2022-09-25 (×3): qty 1

## 2022-09-25 MED ORDER — ONDANSETRON HCL 4 MG/2ML IJ SOLN: 4.0000 mg | Freq: Four times a day (QID) | INTRAMUSCULAR | Status: DC | PRN

## 2022-09-25 MED ORDER — ENOXAPARIN SODIUM 40 MG/0.4ML IJ SOSY
40.0000 mg | PREFILLED_SYRINGE | INTRAMUSCULAR | Status: DC
  Administered 2022-09-25: 40 mg via SUBCUTANEOUS
  Filled 2022-09-25: qty 0.4

## 2022-09-25 MED ORDER — ASPIRIN 81 MG PO CHEW
81.0000 mg | CHEWABLE_TABLET | ORAL | Status: AC
  Administered 2022-09-26: 81 mg via ORAL
  Filled 2022-09-25: qty 1

## 2022-09-25 MED ORDER — POTASSIUM CITRATE ER 10 MEQ (1080 MG) PO TBCR: 20.0000 meq | EXTENDED_RELEASE_TABLET | Freq: Two times a day (BID) | ORAL | Status: DC

## 2022-09-25 MED ORDER — SODIUM CHLORIDE 0.9 % IV SOLN: 250.0000 mL | INTRAVENOUS | Status: DC | PRN

## 2022-09-25 MED ORDER — PRAVASTATIN SODIUM 40 MG PO TABS
40.0000 mg | ORAL_TABLET | Freq: Every day | ORAL | Status: DC
  Administered 2022-09-25: 40 mg via ORAL
  Filled 2022-09-25: qty 1

## 2022-09-25 MED ORDER — ALPRAZOLAM 0.25 MG PO TABS
0.2500 mg | ORAL_TABLET | Freq: Every day | ORAL | Status: DC
  Administered 2022-09-25: 0.25 mg via ORAL
  Filled 2022-09-25: qty 1

## 2022-09-25 MED ORDER — ASPIRIN 81 MG PO TBEC: 81.0000 mg | DELAYED_RELEASE_TABLET | Freq: Every day | ORAL | Status: DC

## 2022-09-25 MED ORDER — PERFLUTREN LIPID MICROSPHERE
1.0000 mL | INTRAVENOUS | Status: AC | PRN
  Administered 2022-09-25: 2 mL via INTRAVENOUS

## 2022-09-25 MED ORDER — LORATADINE 10 MG PO TABS
10.0000 mg | ORAL_TABLET | Freq: Every day | ORAL | Status: DC
  Administered 2022-09-26: 10 mg via ORAL
  Filled 2022-09-25: qty 1

## 2022-09-25 MED ORDER — TRAZODONE HCL 50 MG PO TABS
50.0000 mg | ORAL_TABLET | Freq: Every evening | ORAL | Status: DC | PRN
  Administered 2022-09-25: 50 mg via ORAL
  Filled 2022-09-25: qty 1

## 2022-09-25 MED ORDER — ACETAMINOPHEN 325 MG PO TABS: 650.0000 mg | ORAL_TABLET | ORAL | Status: DC | PRN

## 2022-09-25 MED ORDER — SODIUM CHLORIDE 0.9 % IV SOLN: INTRAVENOUS | Status: DC

## 2022-09-25 NOTE — H&P (View-Only) (Signed)
Cardiology Consultation   Patient ID: Corey [name] MRN: [id number]; DOB: [birth year]  Admit date: 09/24/2022 Date of Consult: 09/25/2022  PCP:  Garlan Fillers, MD   [facility] Providers Cardiologist:  Peter Swaziland, MD        Patient Profile:   Corey [name] is a 77 y.o. male with a hx of minimal CAD by cath 2011 (20-30% mRCA), CKD 3b, thoracic aortic aneurysm (4.3cm by CT 05/2019), chronic LBBB, prostate CA, kidney CA s/p partial nephrectomy 2013 who is being seen 09/25/2022 for the evaluation of chest pain at the request of Dr. Judd Lien.  History of Present Illness:   Corey [name] follows with Dr. Swaziland with the above hx. He had a normal nuc in 09/2009 followed by cardiac cath 12/2009 with minimal CAD in the RCA with normal EF. Last echo 2015 showed abnormal septal motion, EF 50-55%, G1DD, moderate LVH. He had an ED visit 01/2021 with chest pain and reassuring troponins. He has a known ascending TAA. At last OV 02/2022, given that dimensions had not changed much since 2011, Dr. Swaziland felt surveillance could be reduced to q3 years (last study 4.3cm in 05/2019) - he suggested to revisit at next visit.  He presented to [facility] upon transfer from [facility] for evaluation of chest pain. He reports for the last several weeks he has intermittently noticing a left sided chest discomfort which he describes feeling like a pulled muscle. He was working out regularly several days a week and initially would notice the pain at random times without specific association to exertion. He states there were times when it would be there all the time but very mild. He also had some tenderness to palpation of the area. He researched online and became concerned he might need to be checked out for breast CA given h/o hormonal therapy in setting of prior cancers. He had mild pain over the weekend but it did not particularly capture his attention. Yesterday aroudn 1:30pm he went to the gym and felt the  discomfort start up when he was exercising. He stopped exercising and it did not ease off. He walked into our office at [facility] and reports that appointment was made and he was aadvised to proceed to ED if symptoms persisted. When he got home around 3 he took one of his wife's SL NTG and chest pain eased off within just a few minutes. He felt fine the rest of the day, went about errands going out to eat and [employer] without any discomfort. Last night when he went to bed he noticed the pain recurred but felt more like a pressure. He took a SL NTG once again and it did not fully relieve the pain so came to the ER. He got another SL NTG at [facility] ED and fell asleep. When he woke up this morning his pain was gone. He can still feel some discomfort if he palpates that area. He does not recall any specific injury. Pain is not pleuritic in nature. He denies any SOB, n/v, or syncope. He had episode of black stools earlier this year in setting of Pepto use but had colonoscopy 1 month ago by GI that was reportedly reassuring. At [facility] ED hsTroponin neg x2. CBC wnl, Cr 1.63, prior baseline appears 1.4-1.7 going back many years. CXR NAD. EKG shows NSR with known LBBB. He reports he did take his AM medicines today at [facility] ED because they brought them with him.   Past Medical  History:  Diagnosis Date   Arthritis 10/22/2011   hx. osteoarthritis-knees   Ascending aortic aneurysm (HCC)    Coronary artery disease    minimal per cath in 2011   Dyspnea    GERD (gastroesophageal reflux disease)    Heart murmur    History of kidney cancer    History of kidney stones    "YRS AGO"  nothing in last 10 yrs   Hypertension    Lactose intolerance    used to be, not anymore per patient.   Left bundle branch block    Prostate cancer [facility])    Skin cancer     Past Surgical History:  Procedure Laterality Date   APPENDECTOMY     BACK SURGERY     BELPHAROPTOSIS REPAIR Bilateral    BUNIONECTOMY Right    CARDIAC CATHETERIZATION   12/22/2009   EF60%/minimal nonobstructive atherosclerotic coronary artery disease/normal lt ventricular function/mod aortic root dilatation   CYSTOSCOPY WITH RETROGRADE PYELOGRAM, URETEROSCOPY AND STENT PLACEMENT Left 07/28/2012   Procedure: CYSTOSCOPY WITH RETROGRADE PYELOGRAM, LEFT URETEROSCOPY ;  Surgeon: Crecencio Mc, MD;  Location: [facility];  Service: Urology;  Laterality: Left;   CYSTOSCOPY/RETROGRADE/URETEROSCOPY/STONE EXTRACTION WITH BASKET  10/22/2011   1'12   CYSTOSCOPY/URETEROSCOPY/HOLMIUM LASER/STENT PLACEMENT Right 02/18/2020   Procedure: CYSTOSCOPY/RETROGRADE/URETEROSCOPY/HOLMIUM LASER/STENT PLACEMENT;  Surgeon: Heloise Purpura, MD;  Location: [facility];  Service: Urology;  Laterality: Right;   ELBOW SURGERY Right 1990s?   "tendon repaired"   JOINT REPLACEMENT     both knees & R hip   KNEE ARTHROSCOPY     "? side"   LIPOMA EXCISION Left    wrist   LUMBAR SPINE SURGERY  2003   Lumbar fusion -retained hardware([name])   PARTIAL NEPHRECTOMY Right 10/2011   skin cancer removal     TONSILLECTOMY     TOTAL HIP ARTHROPLASTY Right 03/26/2017   Procedure: RIGHT TOTAL HIP ARTHROPLASTY ANTERIOR APPROACH;  Surgeon: Sheral Apley, MD;  Location: [facility] OR;  Service: Orthopedics;  Laterality: Right;   TOTAL KNEE ARTHROPLASTY     bilateral   TRIGGER FINGER RELEASE Right      Home Medications:  Prior to Admission medications   Medication Sig Start Date End Date Taking? Authorizing Provider  ALPRAZolam (XANAX) 0.25 MG tablet Take 0.25 mg by mouth at bedtime.   Yes [provider]  amLODipine (NORVASC) 5 MG tablet TAKE 1 TABLET BY MOUTH EVERY DAY 05/24/21  Yes Swaziland, Peter M, MD  aspirin EC 81 MG tablet Take 81 mg by mouth daily.    Yes [provider]  diclofenac (VOLTAREN) 25 MG EC tablet Take 25 mg by mouth 2 (two) times daily. 09/08/22  Yes [provider]  docusate sodium (COLACE) 100 MG capsule Take 1 capsule (100 mg total) by mouth 2 (two) times daily. Patient  taking differently: Take 100 mg by mouth daily as needed (constipation). 04/14/21  Yes Tressie Stalker, MD  fexofenadine (ALLEGRA) 180 MG tablet Take 180 mg by mouth daily.   Yes [provider]  losartan (COZAAR) 100 MG tablet TAKE 1 TABLET (100 MG TOTAL) BY MOUTH DAILY. PATIENT NEEDS OFFICE VISIT FOR FUTURE REFILLS 02/20/22  Yes Swaziland, Peter M, MD  Multiple Vitamins-Minerals (CENTRUM SILVER 50+MEN) TABS Take 1 tablet by mouth daily.   Yes [provider]  potassium citrate (UROCIT-K) 10 MEQ (1080 MG) SR tablet Take 20 mEq by mouth 2 (two) times daily.   Yes [provider]  pravastatin (PRAVACHOL) 40 MG tablet Take 40 mg by  mouth at bedtime.  02/18/17  Yes [provider]  tamsulosin (FLOMAX) 0.4 MG CAPS capsule Take 0.8 mg by mouth at bedtime.  01/03/17  Yes [provider]    Inpatient Medications: Scheduled Meds:  Continuous Infusions:  PRN Meds: nitroGLYCERIN  Allergies:    Allergies  Allergen Reactions   Ultram [Tramadol] Shortness Of Breath and Other (See Comments)    Elevated heart rate   Neurontin [Gabapentin] Other (See Comments)    Dizziness    Aldara [Imiquimod] Other (See Comments)    Blisters on application site.    Social History:   Social History   Socioeconomic History   Marital status: Married    Spouse name: Corey [name]   Number of children: 3   Years of education: Not on file   Highest education level: Not on file  Occupational History   Occupation: sales [employer]    Comment: Transport planner  Tobacco Use   Smoking status: Never   Smokeless tobacco: Never  Vaping Use   Vaping Use: Never used  Substance and Sexual Activity   Alcohol use: No   Drug use: No   Sexual activity: Yes  Other Topics Concern   Not on file  Social History Narrative   3 grown children: Schuyler Amor, and Carlena Bjornstad.   Social Determinants of Health   Financial Resource Strain: Low Risk  (04/04/2021)   Overall Financial Resource Strain  (CARDIA)    Difficulty of Paying Living Expenses: Not hard at all  Food Insecurity: No Food Insecurity (09/25/2022)   Hunger Vital Sign    Worried About Running Out of Food in the Last Year: Never true    Ran Out of Food in the Last Year: Never true  Transportation Needs: No Transportation Needs (09/25/2022)   PRAPARE - Administrator, Civil Service (Medical): No    Lack of Transportation (Non-Medical): No  Physical Activity: Sufficiently Active (04/04/2021)   Exercise Vital Sign    Days of Exercise per Week: 3 days    Minutes of Exercise per Session: 60 min  Stress: No Stress Concern Present (04/04/2021)   [employer] of Occupational Health - Occupational Stress Questionnaire    Feeling of Stress : Not at all  Social Connections: Socially Integrated (04/04/2021)   Social Connection and Isolation Panel [NHANES]    Frequency of Communication with Friends and Family: More than three times a week    Frequency of Social Gatherings with Friends and Family: Three times a week    Attends Religious Services: 1 to 4 times per year    Active Member of Clubs or Organizations: Yes    Attends Banker Meetings: 1 to 4 times per year    Marital Status: Married  Catering manager Violence: Not At Risk (09/25/2022)   Humiliation, Afraid, Rape, and Kick questionnaire    Fear of Current or Ex-Partner: No    Emotionally Abused: No    Physically Abused: No    Sexually Abused: No    Family History:    Family History  Problem Relation Age of Onset   Prostate cancer Father    Pancreatic cancer Father    Heart attack Brother    Breast cancer Neg Hx    Colon cancer Neg Hx      ROS:  Please see the history of present illness.  All other ROS reviewed and negative.     Physical Exam/Data:   Vitals:   09/25/22 0930 09/25/22 1000 09/25/22 1030 09/25/22  1139  BP: (!) 160/86 (!) 150/87 (!) 149/82 (!) 156/88  Pulse: 84 81 74 74  Resp:    17  Temp:    98 F (36.7 C)  TempSrc:     Oral  SpO2: 97% 96% 95% 97%  Weight:    89.6 kg  Height:    5\' 11"  (1.803 m)    Intake/Output Summary (Last 24 hours) at 09/25/2022 1247 Last data filed at 09/25/2022 1206 Gross per 24 hour  Intake --  Output 180 ml  Net -180 ml      09/25/2022   11:39 AM 02/26/2022    4:04 PM 08/30/2021   11:15 AM  Last 3 Weights  Weight (lbs) 197 lb 8 oz 201 lb 6.4 oz 200 lb 6.4 oz  Weight (kg) 89.585 kg 91.354 kg 90.901 kg     Body mass index is 27.55 kg/m.  General: Well developed, well nourished WM, in no acute distress. Head: Normocephalic, atraumatic, sclera non-icteric, no xanthomas, nares are without discharge. Neck: Negative for carotid bruits. JVP not elevated. Lungs: Clear bilaterally to auscultation without wheezes, rales, or rhonchi. Breathing is unlabored. Heart: RRR S1 S2 without murmurs, rubs, or gallops.  Abdomen: Soft, non-tender, non-distended with normoactive bowel sounds. No rebound/guarding. Extremities: No clubbing or cyanosis. No edema. Distal pedal pulses are 2+ and equal bilaterally. Neuro: Alert and oriented X 3. Moves all extremities spontaneously. Psych:  Responds to questions appropriately with a normal affect.   EKG:  The EKG was personally reviewed and demonstrates:  NSR 75bpm with LBBB with nonspecific STTW changes Telemetry:  Telemetry was personally reviewed and demonstrates:  NSR  Relevant CV Studies: See scanned cath 2011 Echo 2015   - Left ventricle: Abnormal septal motion. The cavity size was    normal. Wall thickness was increased in a pattern of moderate    LVH. Systolic function was normal. The estimated ejection    fraction was in the range of 50% to 55%. Doppler parameters are    consistent with abnormal left ventricular relaxation (grade 1    diastolic dysfunction).  - Atrial septum: No defect or patent foramen ovale was identified.   Laboratory Data:  High Sensitivity Troponin:   Recent Labs  Lab 09/24/22 2145 09/24/22 2345   TROPONINIHS 6 6     Chemistry Recent Labs  Lab 09/24/22 2145  NA 138  K 4.7  CL 106  CO2 23  GLUCOSE 115*  BUN 32*  CREATININE 1.63*  CALCIUM 9.0  GFRNONAA 43*  ANIONGAP 9    No results for input(s): "PROT", "ALBUMIN", "AST", "ALT", "ALKPHOS", "BILITOT" in the last 168 hours. Lipids No results for input(s): "CHOL", "TRIG", "HDL", "LABVLDL", "LDLCALC", "CHOLHDL" in the last 168 hours.  Hematology Recent Labs  Lab 09/24/22 2145  WBC 4.7  RBC 4.67  HGB 15.1  HCT 43.9  MCV 94.0  MCH 32.3  MCHC 34.4  RDW 12.5  PLT 181   Thyroid No results for input(s): "TSH", "FREET4" in the last 168 hours.  BNPNo results for input(s): "BNP", "PROBNP" in the last 168 hours.  DDimer No results for input(s): "DDIMER" in the last 168 hours.   Radiology/Studies:  DG Chest 2 View  Result Date: 09/24/2022 CLINICAL DATA:  Chest pain EXAM: CHEST - 2 VIEW COMPARISON:  02/14/2021 FINDINGS: The heart size and mediastinal contours are within normal limits. Both lungs are clear. The visualized skeletal structures are unremarkable. IMPRESSION: No active cardiopulmonary disease. Electronically Signed   By: Adrian Prows.D.  On: 09/24/2022 22:08     Assessment and Plan:   1. Chest pain with mixed features - waxing/waning over the last several weeks, initially possibly at random without pattern, then episode while exercising yesterday with some relief from SL NTG - recurred overnight, unrelieved with SL NTG, improved after he went to sleep - hsTroponins neg x2 - EKG shows NSR with known LBBB; need to keep in mind CKD hx as well - currently chest pain free - case discussed with Dr. Allyson Sabal, plan IV hydration overnight, hold ARB in AM, and cath tomorrow for definitive eval - will also await echocardiogram - primary team added low dose BB  2. CKD 3b - renal function at baseline - hold losartan as above  3. Ascending thoracic aortic aneurysm - 4.3cm by CT 05/2019 felt stable over last few years  since 2011 - Dr. Swaziland previously suggested that screening be moved to q14yrs and recommended to discuss at 2024 follow-up - symptoms atypical for dissection but will review with MD - mediastinal contours WNL and patient hemodynamically stable  4. Essential HTN - initial BP 128/77, trending hypertensive - reports taking home meds this AM, on losartan 100mg  and amlodipine 5mg  daily at home - hold losartan pending further eval - primary team started low dose metoprolol - takes KCl supplement as outpatient, K 4.7, would hold pending further trends in AM  5. HLD - on pravastatin - lipid panel in AM, escalate dose contingent on values and workup  Risk Assessment/Risk Scores:     TIMI Risk Score for Unstable Angina  The patient's TIMI risk score is 2, which indicates a 8% risk of all cause mortality, new or recurrent myocardial infarction or need for urgent revascularization in the next 14 days.   For questions or updates, please contact [facility] Please consult [email or web address] for contact info under    Signed, Laurann Montana, PA-C  09/25/2022 12:47 PM  Agree with findings by Maryruth Hancock  We were asked to see this delightful 77 year old married Caucasian male patient of Dr. Elvis Coil with history of minimal CAD by cath in 2011, CKD 3B, small thoracic aortic aneurysm who has been having atypical chest pain recently which is nitrate responsive.  He does have a chronic left bundle branch block and serum creatinine in the 1.6 range.  I do not think a stress test would be helpful given his conduction abnormality nor do I feel a coronary CTA would be appropriate given his moderate renal insufficiency.  I am going to hydrate him and arrange for him to undergo diagnostic coronary angiography tomorrow using limited contrast. .  His exam is benign.  His troponins were negative.   I have reviewed the risks, indications, and alternatives to cardiac catheterization, possible angioplasty, and  stenting with the patient. Risks include but are not limited to bleeding, infection, vascular injury, stroke, myocardial infection, arrhythmia, kidney injury, radiation-related injury in the case of prolonged fluoroscopy use, emergency cardiac surgery, and death. The patient understands the risks of serious complication is 1-2 in 1000 with diagnostic cardiac cath and 1-2% or less with angioplasty/stenting.    Runell Gess, M.D., FACP, [facility], [facility] [address] [place], [place]  [address]  [phone] 09/25/2022 1:51 PM

## 2022-09-25 NOTE — ED Notes (Signed)
Called [facility] -- informed pt bed assignment, waiting for transport

## 2022-09-25 NOTE — Consult Note (Addendum)
Cardiology Consultation   Patient ID: DENTRELL COHEE MRN: [id number]; DOB: 06-18-1945  Admit date: 09/24/2022 Date of Consult: 09/25/2022  PCP:  Garlan Fillers, MD   [facility] Providers Cardiologist:  Peter Swaziland, MD        Patient Profile:   Corey [name] is a 77 y.o. male with a hx of minimal CAD by cath 2011 (20-30% mRCA), CKD 3b, thoracic aortic aneurysm (4.3cm by CT 05/2019), chronic LBBB, prostate CA, kidney CA s/p partial nephrectomy 2013 who is being seen 09/25/2022 for the evaluation of chest pain at the request of Dr. Judd Lien.  History of Present Illness:   Corey [name] follows with Dr. Swaziland with the above hx. He had a normal nuc in 09/2009 followed by cardiac cath 12/2009 with minimal CAD in the RCA with normal EF. Last echo 2015 showed abnormal septal motion, EF 50-55%, G1DD, moderate LVH. He had an ED visit 01/2021 with chest pain and reassuring troponins. He has a known ascending TAA. At last OV 02/2022, given that dimensions had not changed much since 2011, Dr. Swaziland felt surveillance could be reduced to q3 years (last study 4.3cm in 05/2019) - he suggested to revisit at next visit.  He presented to [facility] upon transfer from [facility] for evaluation of chest pain. He reports for the last several weeks he has intermittently noticing a left sided chest discomfort which he describes feeling like a pulled muscle. He was working out regularly several days a week and initially would notice the pain at random times without specific association to exertion. He states there were times when it would be there all the time but very mild. He also had some tenderness to palpation of the area. He researched online and became concerned he might need to be checked out for breast CA given h/o hormonal therapy in setting of prior cancers. He had mild pain over the weekend but it did not particularly capture his attention. Yesterday aroudn 1:30pm he went to the gym and felt the  discomfort start up when he was exercising. He stopped exercising and it did not ease off. He walked into our office at [facility] and reports that appointment was made and he was aadvised to proceed to ED if symptoms persisted. When he got home around 3 he took one of his wife's SL NTG and chest pain eased off within just a few minutes. He felt fine the rest of the day, went about errands going out to eat and [employer] without any discomfort. Last night when he went to bed he noticed the pain recurred but felt more like a pressure. He took a SL NTG once again and it did not fully relieve the pain so came to the ER. He got another SL NTG at [facility] ED and fell asleep. When he woke up this morning his pain was gone. He can still feel some discomfort if he palpates that area. He does not recall any specific injury. Pain is not pleuritic in nature. He denies any SOB, n/v, or syncope. He had episode of black stools earlier this year in setting of Pepto use but had colonoscopy 1 month ago by GI that was reportedly reassuring. At [facility] ED hsTroponin neg x2. CBC wnl, Cr 1.63, prior baseline appears 1.4-1.7 going back many years. CXR NAD. EKG shows NSR with known LBBB. He reports he did take his AM medicines today at [facility] ED because they brought them with him.   Past Medical  History:  Diagnosis Date   Arthritis 10/22/2011   hx. osteoarthritis-knees   Ascending aortic aneurysm (HCC)    Coronary artery disease    minimal per cath in 2011   Dyspnea    GERD (gastroesophageal reflux disease)    Heart murmur    History of kidney cancer    History of kidney stones    "YRS AGO"  nothing in last 10 yrs   Hypertension    Lactose intolerance    used to be, not anymore per patient.   Left bundle branch block    Prostate cancer [facility])    Skin cancer     Past Surgical History:  Procedure Laterality Date   APPENDECTOMY     BACK SURGERY     BELPHAROPTOSIS REPAIR Bilateral    BUNIONECTOMY Right    CARDIAC CATHETERIZATION   12/22/2009   EF60%/minimal nonobstructive atherosclerotic coronary artery disease/normal lt ventricular function/mod aortic root dilatation   CYSTOSCOPY WITH RETROGRADE PYELOGRAM, URETEROSCOPY AND STENT PLACEMENT Left 07/28/2012   Procedure: CYSTOSCOPY WITH RETROGRADE PYELOGRAM, LEFT URETEROSCOPY ;  Surgeon: Crecencio Mc, MD;  Location: [facility];  Service: Urology;  Laterality: Left;   CYSTOSCOPY/RETROGRADE/URETEROSCOPY/STONE EXTRACTION WITH BASKET  10/22/2011   1'12   CYSTOSCOPY/URETEROSCOPY/HOLMIUM LASER/STENT PLACEMENT Right 02/18/2020   Procedure: CYSTOSCOPY/RETROGRADE/URETEROSCOPY/HOLMIUM LASER/STENT PLACEMENT;  Surgeon: Heloise Purpura, MD;  Location: [facility];  Service: Urology;  Laterality: Right;   ELBOW SURGERY Right 1990s?   "tendon repaired"   JOINT REPLACEMENT     both knees & R hip   KNEE ARTHROSCOPY     "? side"   LIPOMA EXCISION Left    wrist   LUMBAR SPINE SURGERY  2003   Lumbar fusion -retained hardware([name])   PARTIAL NEPHRECTOMY Right 10/2011   skin cancer removal     TONSILLECTOMY     TOTAL HIP ARTHROPLASTY Right 03/26/2017   Procedure: RIGHT TOTAL HIP ARTHROPLASTY ANTERIOR APPROACH;  Surgeon: Sheral Apley, MD;  Location: [facility] OR;  Service: Orthopedics;  Laterality: Right;   TOTAL KNEE ARTHROPLASTY     bilateral   TRIGGER FINGER RELEASE Right      Home Medications:  Prior to Admission medications   Medication Sig Start Date End Date Taking? Authorizing Provider  ALPRAZolam (XANAX) 0.25 MG tablet Take 0.25 mg by mouth at bedtime.   Yes [provider]  amLODipine (NORVASC) 5 MG tablet TAKE 1 TABLET BY MOUTH EVERY DAY 05/24/21  Yes Swaziland, Peter M, MD  aspirin EC 81 MG tablet Take 81 mg by mouth daily.    Yes [provider]  diclofenac (VOLTAREN) 25 MG EC tablet Take 25 mg by mouth 2 (two) times daily. 09/08/22  Yes [provider]  docusate sodium (COLACE) 100 MG capsule Take 1 capsule (100 mg total) by mouth 2 (two) times daily. Patient  taking differently: Take 100 mg by mouth daily as needed (constipation). 04/14/21  Yes Tressie Stalker, MD  fexofenadine (ALLEGRA) 180 MG tablet Take 180 mg by mouth daily.   Yes [provider]  losartan (COZAAR) 100 MG tablet TAKE 1 TABLET (100 MG TOTAL) BY MOUTH DAILY. PATIENT NEEDS OFFICE VISIT FOR FUTURE REFILLS 02/20/22  Yes Swaziland, Peter M, MD  Multiple Vitamins-Minerals (CENTRUM SILVER 50+MEN) TABS Take 1 tablet by mouth daily.   Yes [provider]  potassium citrate (UROCIT-K) 10 MEQ (1080 MG) SR tablet Take 20 mEq by mouth 2 (two) times daily.   Yes [provider]  pravastatin (PRAVACHOL) 40 MG tablet Take 40 mg by  mouth at bedtime.  02/18/17  Yes [provider]  tamsulosin (FLOMAX) 0.4 MG CAPS capsule Take 0.8 mg by mouth at bedtime.  01/03/17  Yes [provider]    Inpatient Medications: Scheduled Meds:  Continuous Infusions:  PRN Meds: nitroGLYCERIN  Allergies:    Allergies  Allergen Reactions   Ultram [Tramadol] Shortness Of Breath and Other (See Comments)    Elevated heart rate   Neurontin [Gabapentin] Other (See Comments)    Dizziness    Aldara [Imiquimod] Other (See Comments)    Blisters on application site.    Social History:   Social History   Socioeconomic History   Marital status: Married    Spouse name: Barth Kirks   Number of children: 3   Years of education: Not on file   Highest education level: Not on file  Occupational History   Occupation: sales [employer]    Comment: Transport planner  Tobacco Use   Smoking status: Never   Smokeless tobacco: Never  Vaping Use   Vaping Use: Never used  Substance and Sexual Activity   Alcohol use: No   Drug use: No   Sexual activity: Yes  Other Topics Concern   Not on file  Social History Narrative   3 grown children: Schuyler Amor, and Carlena Bjornstad.   Social Determinants of Health   Financial Resource Strain: Low Risk  (04/04/2021)   Overall Financial Resource Strain  (CARDIA)    Difficulty of Paying Living Expenses: Not hard at all  Food Insecurity: No Food Insecurity (09/25/2022)   Hunger Vital Sign    Worried About Running Out of Food in the Last Year: Never true    Ran Out of Food in the Last Year: Never true  Transportation Needs: No Transportation Needs (09/25/2022)   PRAPARE - Administrator, Civil Service (Medical): No    Lack of Transportation (Non-Medical): No  Physical Activity: Sufficiently Active (04/04/2021)   Exercise Vital Sign    Days of Exercise per Week: 3 days    Minutes of Exercise per Session: 60 min  Stress: No Stress Concern Present (04/04/2021)   [employer] of Occupational Health - Occupational Stress Questionnaire    Feeling of Stress : Not at all  Social Connections: Socially Integrated (04/04/2021)   Social Connection and Isolation Panel [NHANES]    Frequency of Communication with Friends and Family: More than three times a week    Frequency of Social Gatherings with Friends and Family: Three times a week    Attends Religious Services: 1 to 4 times per year    Active Member of Clubs or Organizations: Yes    Attends Banker Meetings: 1 to 4 times per year    Marital Status: Married  Catering manager Violence: Not At Risk (09/25/2022)   Humiliation, Afraid, Rape, and Kick questionnaire    Fear of Current or Ex-Partner: No    Emotionally Abused: No    Physically Abused: No    Sexually Abused: No    Family History:    Family History  Problem Relation Age of Onset   Prostate cancer Father    Pancreatic cancer Father    Heart attack Brother    Breast cancer Neg Hx    Colon cancer Neg Hx      ROS:  Please see the history of present illness.  All other ROS reviewed and negative.     Physical Exam/Data:   Vitals:   09/25/22 0930 09/25/22 1000 09/25/22 1030 09/25/22  1139  BP: (!) 160/86 (!) 150/87 (!) 149/82 (!) 156/88  Pulse: 84 81 74 74  Resp:    17  Temp:    98 F (36.7 C)  TempSrc:     Oral  SpO2: 97% 96% 95% 97%  Weight:    89.6 kg  Height:    5\' 11"  (1.803 m)    Intake/Output Summary (Last 24 hours) at 09/25/2022 1247 Last data filed at 09/25/2022 1206 Gross per 24 hour  Intake --  Output 180 ml  Net -180 ml      09/25/2022   11:39 AM 02/26/2022    4:04 PM 08/30/2021   11:15 AM  Last 3 Weights  Weight (lbs) 197 lb 8 oz 201 lb 6.4 oz 200 lb 6.4 oz  Weight (kg) 89.585 kg 91.354 kg 90.901 kg     Body mass index is 27.55 kg/m.  General: Well developed, well nourished WM, in no acute distress. Head: Normocephalic, atraumatic, sclera non-icteric, no xanthomas, nares are without discharge. Neck: Negative for carotid bruits. JVP not elevated. Lungs: Clear bilaterally to auscultation without wheezes, rales, or rhonchi. Breathing is unlabored. Heart: RRR S1 S2 without murmurs, rubs, or gallops.  Abdomen: Soft, non-tender, non-distended with normoactive bowel sounds. No rebound/guarding. Extremities: No clubbing or cyanosis. No edema. Distal pedal pulses are 2+ and equal bilaterally. Neuro: Alert and oriented X 3. Moves all extremities spontaneously. Psych:  Responds to questions appropriately with a normal affect.   EKG:  The EKG was personally reviewed and demonstrates:  NSR 75bpm with LBBB with nonspecific STTW changes Telemetry:  Telemetry was personally reviewed and demonstrates:  NSR  Relevant CV Studies: See scanned cath 2011 Echo 2015   - Left ventricle: Abnormal septal motion. The cavity size was    normal. Wall thickness was increased in a pattern of moderate    LVH. Systolic function was normal. The estimated ejection    fraction was in the range of 50% to 55%. Doppler parameters are    consistent with abnormal left ventricular relaxation (grade 1    diastolic dysfunction).  - Atrial septum: No defect or patent foramen ovale was identified.   Laboratory Data:  High Sensitivity Troponin:   Recent Labs  Lab 09/24/22 2145 09/24/22 2345   TROPONINIHS 6 6     Chemistry Recent Labs  Lab 09/24/22 2145  NA 138  K 4.7  CL 106  CO2 23  GLUCOSE 115*  BUN 32*  CREATININE 1.63*  CALCIUM 9.0  GFRNONAA 43*  ANIONGAP 9    No results for input(s): "PROT", "ALBUMIN", "AST", "ALT", "ALKPHOS", "BILITOT" in the last 168 hours. Lipids No results for input(s): "CHOL", "TRIG", "HDL", "LABVLDL", "LDLCALC", "CHOLHDL" in the last 168 hours.  Hematology Recent Labs  Lab 09/24/22 2145  WBC 4.7  RBC 4.67  HGB 15.1  HCT 43.9  MCV 94.0  MCH 32.3  MCHC 34.4  RDW 12.5  PLT 181   Thyroid No results for input(s): "TSH", "FREET4" in the last 168 hours.  BNPNo results for input(s): "BNP", "PROBNP" in the last 168 hours.  DDimer No results for input(s): "DDIMER" in the last 168 hours.   Radiology/Studies:  DG Chest 2 View  Result Date: 09/24/2022 CLINICAL DATA:  Chest pain EXAM: CHEST - 2 VIEW COMPARISON:  02/14/2021 FINDINGS: The heart size and mediastinal contours are within normal limits. Both lungs are clear. The visualized skeletal structures are unremarkable. IMPRESSION: No active cardiopulmonary disease. Electronically Signed   By: Adrian Prows.D.  On: 09/24/2022 22:08     Assessment and Plan:   1. Chest pain with mixed features - waxing/waning over the last several weeks, initially possibly at random without pattern, then episode while exercising yesterday with some relief from SL NTG - recurred overnight, unrelieved with SL NTG, improved after he went to sleep - hsTroponins neg x2 - EKG shows NSR with known LBBB; need to keep in mind CKD hx as well - currently chest pain free - case discussed with Dr. Allyson Sabal, plan IV hydration overnight, hold ARB in AM, and cath tomorrow for definitive eval - will also await echocardiogram - primary team added low dose BB  2. CKD 3b - renal function at baseline - hold losartan as above  3. Ascending thoracic aortic aneurysm - 4.3cm by CT 05/2019 felt stable over last few years  since 2011 - Dr. Swaziland previously suggested that screening be moved to q38yrs and recommended to discuss at 2024 follow-up - symptoms atypical for dissection but will review with MD - mediastinal contours WNL and patient hemodynamically stable  4. Essential HTN - initial BP 128/77, trending hypertensive - reports taking home meds this AM, on losartan 100mg  and amlodipine 5mg  daily at home - hold losartan pending further eval - primary team started low dose metoprolol - takes KCl supplement as outpatient, K 4.7, would hold pending further trends in AM  5. HLD - on pravastatin - lipid panel in AM, escalate dose contingent on values and workup  Risk Assessment/Risk Scores:     TIMI Risk Score for Unstable Angina  The patient's TIMI risk score is 2, which indicates a 8% risk of all cause mortality, new or recurrent myocardial infarction or need for urgent revascularization in the next 14 days.   For questions or updates, please contact [facility] Please consult [email or web address] for contact info under    Signed, Laurann Montana, PA-C  09/25/2022 12:47 PM  Agree with findings by Maryruth Hancock  We were asked to see this delightful 77 year old married Caucasian male patient of Dr. Elvis Coil with history of minimal CAD by cath in 2011, CKD 3B, small thoracic aortic aneurysm who has been having atypical chest pain recently which is nitrate responsive.  He does have a chronic left bundle branch block and serum creatinine in the 1.6 range.  I do not think a stress test would be helpful given his conduction abnormality nor do I feel a coronary CTA would be appropriate given his moderate renal insufficiency.  I am going to hydrate him and arrange for him to undergo diagnostic coronary angiography tomorrow using limited contrast. .  His exam is benign.  His troponins were negative.   I have reviewed the risks, indications, and alternatives to cardiac catheterization, possible angioplasty, and  stenting with the patient. Risks include but are not limited to bleeding, infection, vascular injury, stroke, myocardial infection, arrhythmia, kidney injury, radiation-related injury in the case of prolonged fluoroscopy use, emergency cardiac surgery, and death. The patient understands the risks of serious complication is 1-2 in 1000 with diagnostic cardiac cath and 1-2% or less with angioplasty/stenting.    Runell Gess, M.D., FACP, [facility], [facility] [address] [place], [place]  [address]  [phone] 09/25/2022 1:51 PM

## 2022-09-25 NOTE — H&P (Signed)
History and Physical    Patient: Corey [name]:[id number] DOB: 1945/05/09 DOA: 09/24/2022 DOS: the patient was seen and examined on 09/25/2022 PCP: Garlan Fillers, MD  Patient coming from: Home - lives with wife; NOK: Wife, Farah Schirtzinger, [phone]   Chief Complaint: Chest pressure  HPI: Corey [name] is a 77 y.o. male with medical history significant of CAD, HTN, and prostate/kidney cancer presenting with intermittent exertional chest pressure, relieved with NTG. Periodic CP in left chest.  Worse last week, particularly at the [facility].  He thought it was pulled muscle but he thought it went away.  [facility] and pain restarted when he got on the exercise bike.  He stopped and went to cardiology office - they made him an appt and told him to go to the ER if it worsened.  He went home and took NTG (his wife's rx).  Chest pain started about 1330, took NTG about 3pm with relief within minutes. He went shopping and watched tv.  At 9pm, chest pain started again but this time more like pressure.  He took NTG with some improvement but pressure continued and so he came to the ER.  They gave him more NTG and it improved and he went to sleep.  At first, the pain was very mild, he even wondered about breast cancer since he previously had hormone therapy and then thought maybe a pulled muscle.  It resolved again until Monday.  First noticed it maybe a few months ago but milder.  It was not present with all activity, might have been there even without exercise.  No associated SOB or nausea, had colonoscopy about a month ago and it was routine.  He did have back surgery last year and was sedentary for an extended period of time.    ER Course:  [facility] transfer, per Dr. Julian Reil:   Intermittent chest pain for past 1.5 weeks, worse with exertion.  Seemingly increasing during rest.  Improved with wife's NTG.  No cardiac history other than chronic LBBB.  First trops neg. Cardiology  fellow recommended [facility] admission.      Review of Systems: As mentioned in the history of present illness. All other systems reviewed and are negative. Past Medical History:  Diagnosis Date   Arthritis 10/22/2011   hx. osteoarthritis-knees   Ascending aortic aneurysm (HCC)    Coronary artery disease    minimal per cath in 2011   Dyspnea    GERD (gastroesophageal reflux disease)    Heart murmur    History of kidney cancer    History of kidney stones    "YRS AGO"  nothing in last 10 yrs   Hypertension    Left bundle branch block    Prostate cancer (HCC)    Skin cancer    Past Surgical History:  Procedure Laterality Date   APPENDECTOMY     BACK SURGERY     BELPHAROPTOSIS REPAIR Bilateral    BUNIONECTOMY Right    CARDIAC CATHETERIZATION  12/22/2009   EF60%/minimal nonobstructive atherosclerotic coronary artery disease/normal lt ventricular function/mod aortic root dilatation   CYSTOSCOPY WITH RETROGRADE PYELOGRAM, URETEROSCOPY AND STENT PLACEMENT Left 07/28/2012   Procedure: CYSTOSCOPY WITH RETROGRADE PYELOGRAM, LEFT URETEROSCOPY ;  Surgeon: Crecencio Mc, MD;  Location: [facility];  Service: Urology;  Laterality: Left;   CYSTOSCOPY/RETROGRADE/URETEROSCOPY/STONE EXTRACTION WITH BASKET  10/22/2011   1'12   CYSTOSCOPY/URETEROSCOPY/HOLMIUM LASER/STENT PLACEMENT Right 02/18/2020   Procedure: CYSTOSCOPY/RETROGRADE/URETEROSCOPY/HOLMIUM LASER/STENT PLACEMENT;  Surgeon: Laverle Patter,  Konrad Dolores, MD;  Location: [facility];  Service: Urology;  Laterality: Right;   ELBOW SURGERY Right 1990s?   "tendon repaired"   JOINT REPLACEMENT     both knees & R hip   KNEE ARTHROSCOPY     "? side"   LIPOMA EXCISION Left    wrist   LUMBAR SPINE SURGERY  2003   Lumbar fusion -retained hardware([name])   PARTIAL NEPHRECTOMY Right 10/2011   skin cancer removal     TONSILLECTOMY     TOTAL HIP ARTHROPLASTY Right 03/26/2017   Procedure: RIGHT TOTAL HIP ARTHROPLASTY ANTERIOR APPROACH;  Surgeon: Sheral Apley, MD;   Location: [facility];  Service: Orthopedics;  Laterality: Right;   TOTAL KNEE ARTHROPLASTY     bilateral   TRIGGER FINGER RELEASE Right    Social History:  reports that he has never smoked. He has never used smokeless tobacco. He reports that he does not drink alcohol and does not use drugs.  Allergies  Allergen Reactions   Ultram [Tramadol] Shortness Of Breath and Other (See Comments)    Elevated heart rate   Neurontin [Gabapentin] Other (See Comments)    Dizziness    Aldara [Imiquimod] Other (See Comments)    Blisters on application site.    Family History  Problem Relation Age of Onset   Prostate cancer Father    Pancreatic cancer Father    Heart attack Brother    Breast cancer Neg Hx    Colon cancer Neg Hx     Prior to Admission medications   Medication Sig Start Date End Date Taking? Authorizing Provider  ALPRAZolam (XANAX) 0.25 MG tablet Take 0.25 mg by mouth at bedtime.   Yes [provider]  amLODipine (NORVASC) 5 MG tablet TAKE 1 TABLET BY MOUTH EVERY DAY 05/24/21  Yes [place], Peter M, MD  aspirin EC 81 MG tablet Take 81 mg by mouth daily.    Yes [provider]  diclofenac (VOLTAREN) 25 MG EC tablet Take 25 mg by mouth 2 (two) times daily. 09/08/22  Yes [provider]  docusate sodium (COLACE) 100 MG capsule Take 1 capsule (100 mg total) by mouth 2 (two) times daily. Patient taking differently: Take 100 mg by mouth daily as needed (constipation). 04/14/21  Yes Tressie Stalker, MD  losartan (COZAAR) 100 MG tablet TAKE 1 TABLET (100 MG TOTAL) BY MOUTH DAILY. PATIENT NEEDS OFFICE VISIT FOR FUTURE REFILLS 02/20/22  Yes [place], Peter M, MD  potassium citrate (UROCIT-K) 10 MEQ (1080 MG) SR tablet Take 20 mEq by mouth 2 (two) times daily.   Yes [provider]  pravastatin (PRAVACHOL) 40 MG tablet Take 40 mg by mouth at bedtime.  02/18/17  Yes [provider]  tamsulosin (FLOMAX) 0.4 MG CAPS capsule Take 0.8 mg by mouth at bedtime.  01/03/17   Yes [provider]    Physical Exam: Vitals:   09/25/22 1000 09/25/22 1030 09/25/22 1139 09/25/22 1352  BP: (!) 150/87 (!) 149/82 (!) 156/88 (!) 154/85  Pulse: 81 74 74 73  Resp:   17   Temp:   98 F (36.7 C)   TempSrc:   Oral   SpO2: 96% 95% 97%   Weight:   89.6 kg   Height:   5\' 11"  (1.803 m)    General:  Appears calm and comfortable and is in NAD, very conversant Eyes:  EOMI, normal lids, iris ENT:  grossly normal hearing, lips & tongue, mmm Neck:  no LAD, masses or thyromegaly Cardiovascular:  RRR, no m/r/g. No LE edema.  Respiratory:   CTA bilaterally with no wheezes/rales/rhonchi.  Normal respiratory effort. Abdomen:  soft, NT, ND Skin:  no rash or induration seen on limited exam Musculoskeletal:  grossly normal tone BUE/BLE, good ROM, no bony abnormality Psychiatric:  grossly normal mood and affect, speech fluent and appropriate, AOx3 Neurologic:  CN 2-12 grossly intact, moves all extremities in coordinated fashion   Radiological Exams on Admission: Independently reviewed - see discussion in A/P where applicable  DG Chest 2 View  Result Date: 09/24/2022 CLINICAL DATA:  Chest pain EXAM: CHEST - 2 VIEW COMPARISON:  02/14/2021 FINDINGS: The heart size and mediastinal contours are within normal limits. Both lungs are clear. The visualized skeletal structures are unremarkable. IMPRESSION: No active cardiopulmonary disease. Electronically Signed   By: Jasmine Pang M.D.   On: 09/24/2022 22:08    EKG: Independently reviewed.  NSR with rate 75; prolonged QTc 500; LBBB; nonspecific ST changes with no evidence of acute ischemia   Labs on Admission: I have personally reviewed the available labs and imaging studies at the time of the admission.  Pertinent labs:    Glucose 115 BUN 32/Creatinine 1.63/GFR 43 - stable HS troponin 6, 6 Normal CBC   Assessment and Plan: Principal Problem:   Chest pain, rule out acute myocardial infarction Active Problems:    Essential hypertension   CKD (chronic kidney disease) stage 3, GFR 30-59 ml/min (HCC)   Malignant neoplasm of prostate (HCC)   Dyslipidemia    Chest pain -Patient with left-sided chest pain and substernal pressure that has come on intermittently for days to weeks possibly worse with exertion, possibly escalating, improved with NTG. -While this may be atypical CP, it is also concerning for unstable angina  -CXR unremarkable.   -Initial cardiac HS troponin negative with negative delta.  -EKG without STEMI.   -Will plan to place in observation status on telemetry to rule out ACS by overnight observation.  -Continue ASA 81 mg daily -Risk factor stratification with HgbA1c and FLP; will also check TSH  -Cardiology consulting -Plan for diagnostic cath tomorrow  HTN -Continue amlodipine -Hold losartan -Will also add prn hydralazine  Stage 3 CKD -Appears to be stable at this time, but appears to have mildly regressed from stage 3a -> 3b -Attempt to avoid nephrotoxic medications -Recheck BMP in AM   HLD -Continue pravastatin -Check lipids  Prostate/kidney cancer -Stage T2a prostate adenocarcinoma, completed treatment in 2022 -s/p partial nephrectomy in 2013  -Continue tamsulosin       Advance Care Planning:   Code Status: Full Code - Code status was discussed with the patient at the time of admission.  The patient would want to receive full resuscitative measures at this time.   Consults: Cardiology  DVT Prophylaxis: Lovenox  Family Communication: None present; he is capable of communicating with family at this time  Severity of Illness: The appropriate patient status for this patient is OBSERVATION. Observation status is judged to be reasonable and necessary in order to provide the required intensity of service to ensure the patient's safety. The patient's presenting symptoms, physical exam findings, and initial radiographic and laboratory data in the context of their  medical condition is felt to place them at decreased risk for further clinical deterioration. Furthermore, it is anticipated that the patient will be medically stable for discharge from the hospital within 2 midnights of admission.   Author: Jonah Blue, MD 09/25/2022 2:13 PM  For on call review [email or web address].

## 2022-09-26 ENCOUNTER — Encounter (HOSPITAL_COMMUNITY)
Admission: EM | Disposition: A | Discharge status: Home / Self Care | Payer: Self-pay | Source: Home / Self Care | Attending: Emergency Medicine

## 2022-09-26 DIAGNOSIS — I2510 Atherosclerotic heart disease of native coronary artery without angina pectoris: Secondary | ICD-10-CM

## 2022-09-26 DIAGNOSIS — Z96653 Presence of artificial knee joint, bilateral: Secondary | ICD-10-CM | POA: Diagnosis not present

## 2022-09-26 DIAGNOSIS — I129 Hypertensive chronic kidney disease with stage 1 through stage 4 chronic kidney disease, or unspecified chronic kidney disease: Secondary | ICD-10-CM | POA: Diagnosis not present

## 2022-09-26 DIAGNOSIS — Z85528 Personal history of other malignant neoplasm of kidney: Secondary | ICD-10-CM | POA: Diagnosis not present

## 2022-09-26 DIAGNOSIS — I10 Essential (primary) hypertension: Secondary | ICD-10-CM | POA: Diagnosis not present

## 2022-09-26 DIAGNOSIS — Z8546 Personal history of malignant neoplasm of prostate: Secondary | ICD-10-CM | POA: Diagnosis not present

## 2022-09-26 DIAGNOSIS — I7121 Aneurysm of the ascending aorta, without rupture: Secondary | ICD-10-CM | POA: Diagnosis not present

## 2022-09-26 DIAGNOSIS — Z85828 Personal history of other malignant neoplasm of skin: Secondary | ICD-10-CM | POA: Diagnosis not present

## 2022-09-26 DIAGNOSIS — R079 Chest pain, unspecified: Secondary | ICD-10-CM | POA: Diagnosis not present

## 2022-09-26 DIAGNOSIS — N179 Acute kidney failure, unspecified: Secondary | ICD-10-CM | POA: Diagnosis not present

## 2022-09-26 DIAGNOSIS — N1832 Chronic kidney disease, stage 3b: Secondary | ICD-10-CM | POA: Diagnosis not present

## 2022-09-26 HISTORY — PX: LEFT HEART CATH AND CORONARY ANGIOGRAPHY: CATH118249

## 2022-09-26 LAB — BASIC METABOLIC PANEL
Anion gap: 9 (ref 5–15)
Anion gap: 8 (ref 5–15)
BUN: 30 mg/dL — ABNORMAL HIGH (ref 8–23)
BUN: 29 mg/dL — ABNORMAL HIGH (ref 8–23)
CO2: 20 mmol/L — ABNORMAL LOW (ref 22–32)
CO2: 22 mmol/L (ref 22–32)
Calcium: 8.6 mg/dL — ABNORMAL LOW (ref 8.9–10.3)
Calcium: 8.9 mg/dL (ref 8.9–10.3)
Chloride: 109 mmol/L (ref 98–111)
Chloride: 109 mmol/L (ref 98–111)
Creatinine, Ser: 1.75 mg/dL — ABNORMAL HIGH (ref 0.61–1.24)
Creatinine, Ser: 1.64 mg/dL — ABNORMAL HIGH (ref 0.61–1.24)
GFR, Estimated: 40 mL/min — ABNORMAL LOW (ref >60)
GFR, Estimated: 43 mL/min — ABNORMAL LOW (ref >60)
Glucose, Bld: 97 mg/dL (ref 70–99)
Glucose, Bld: 97 mg/dL (ref 70–99)
Potassium: 4.5 mmol/L (ref 3.5–5.1)
Potassium: 4.3 mmol/L (ref 3.5–5.1)
Sodium: 138 mmol/L (ref 135–145)
Sodium: 139 mmol/L (ref 135–145)

## 2022-09-26 LAB — CBC
HCT: 43.7 % (ref 39.0–52.0)
Hemoglobin: 14.8 g/dL (ref 13.0–17.0)
MCH: 31.7 pg (ref 26.0–34.0)
MCHC: 33.9 g/dL (ref 30.0–36.0)
MCV: 93.6 fL (ref 80.0–100.0)
Platelets: 164 10*3/uL (ref 150–400)
RBC: 4.67 MIL/uL (ref 4.22–5.81)
RDW: 12.3 % (ref 11.5–15.5)
WBC: 5.7 10*3/uL (ref 4.0–10.5)
nRBC: 0 % (ref 0.0–0.2)

## 2022-09-26 LAB — LIPID PANEL
Cholesterol: 154 mg/dL (ref 0–200)
HDL: 48 mg/dL (ref >40)
LDL Cholesterol: 89 mg/dL (ref 0–99)
Total CHOL/HDL Ratio: 3.2 RATIO
Triglycerides: 87 mg/dL (ref <150)
VLDL: 17 mg/dL (ref 0–40)

## 2022-09-26 LAB — TSH: TSH: 1.174 u[IU]/mL (ref 0.350–4.500)

## 2022-09-26 LAB — HEMOGLOBIN A1C
Hgb A1c MFr Bld: 5.3 % (ref 4.8–5.6)
Mean Plasma Glucose: 105.41 mg/dL

## 2022-09-26 SURGERY — LEFT HEART CATH AND CORONARY ANGIOGRAPHY
Anesthesia: LOCAL

## 2022-09-26 MED ORDER — DICLOFENAC SODIUM 25 MG PO TBEC: 25.0000 mg | DELAYED_RELEASE_TABLET | Freq: Two times a day (BID) | ORAL | Status: DC | PRN

## 2022-09-26 MED ORDER — METOPROLOL TARTRATE 25 MG PO TABS: 12.5000 mg | ORAL_TABLET | Freq: Two times a day (BID) | ORAL | 0 refills | Status: DC

## 2022-09-26 MED ORDER — SODIUM CHLORIDE 0.9 % IV SOLN: 250.0000 mL | INTRAVENOUS | Status: DC | PRN

## 2022-09-26 MED ORDER — VERAPAMIL HCL 2.5 MG/ML IV SOLN
INTRAVENOUS | Status: DC | PRN
  Administered 2022-09-26: 10 mL via INTRA_ARTERIAL

## 2022-09-26 MED ORDER — LIDOCAINE HCL (PF) 1 % IJ SOLN
INTRAMUSCULAR | Status: AC
  Filled 2022-09-26: qty 30

## 2022-09-26 MED ORDER — FENTANYL CITRATE (PF) 100 MCG/2ML IJ SOLN
INTRAMUSCULAR | Status: AC
  Filled 2022-09-26: qty 2

## 2022-09-26 MED ORDER — MIDAZOLAM HCL 2 MG/2ML IJ SOLN
INTRAMUSCULAR | Status: AC
  Filled 2022-09-26: qty 2

## 2022-09-26 MED ORDER — IOHEXOL 350 MG/ML SOLN
INTRAVENOUS | Status: DC | PRN
  Administered 2022-09-26: 35 mL

## 2022-09-26 MED ORDER — HEPARIN (PORCINE) IN NACL 1000-0.9 UT/500ML-% IV SOLN
INTRAVENOUS | Status: DC | PRN
  Administered 2022-09-26 (×2): 500 mL

## 2022-09-26 MED ORDER — SODIUM CHLORIDE 0.9% FLUSH: 3.0000 mL | Freq: Two times a day (BID) | INTRAVENOUS | Status: DC

## 2022-09-26 MED ORDER — MIDAZOLAM HCL 2 MG/2ML IJ SOLN
INTRAMUSCULAR | Status: DC | PRN
  Administered 2022-09-26: 1 mg via INTRAVENOUS

## 2022-09-26 MED ORDER — SODIUM CHLORIDE 0.9 % WEIGHT BASED INFUSION
1.0000 mL/kg/h | INTRAVENOUS | Status: AC
  Administered 2022-09-26: 1 mL/kg/h via INTRAVENOUS

## 2022-09-26 MED ORDER — FENTANYL CITRATE (PF) 100 MCG/2ML IJ SOLN
INTRAMUSCULAR | Status: DC | PRN
  Administered 2022-09-26: 25 ug via INTRAVENOUS

## 2022-09-26 MED ORDER — SODIUM CHLORIDE 0.9% FLUSH: 3.0000 mL | INTRAVENOUS | Status: DC | PRN

## 2022-09-26 MED ORDER — HEPARIN SODIUM (PORCINE) 1000 UNIT/ML IJ SOLN
INTRAMUSCULAR | Status: DC | PRN
  Administered 2022-09-26: 4500 [IU] via INTRAVENOUS

## 2022-09-26 MED ORDER — LIDOCAINE HCL (PF) 1 % IJ SOLN
INTRAMUSCULAR | Status: DC | PRN
  Administered 2022-09-26: 2 mL via INTRADERMAL

## 2022-09-26 SURGICAL SUPPLY — 10 items
CATH 5FR JL3.5 JR4 ANG PIG MP (CATHETERS) IMPLANT
DEVICE RAD COMP TR BAND LRG (VASCULAR PRODUCTS) IMPLANT
GLIDESHEATH SLEND SS 6F .021 (SHEATH) IMPLANT
GUIDEWIRE INQWIRE 1.5J.035X260 (WIRE) IMPLANT
INQWIRE 1.5J .035X260CM (WIRE) ×2
KIT HEART LEFT (KITS) ×1 IMPLANT
PACK CARDIAC CATHETERIZATION (CUSTOM PROCEDURE TRAY) ×1 IMPLANT
SHEATH PROBE COVER 6X72 (BAG) IMPLANT
TRANSDUCER W/STOPCOCK (MISCELLANEOUS) ×1 IMPLANT
TUBING CIL FLEX 10 FLL-RA (TUBING) ×1 IMPLANT

## 2022-09-26 NOTE — Interval H&P Note (Signed)
History and Physical Interval Note:  09/26/2022 9:25 AM  Corey [name]  has presented today for surgery, with the diagnosis of chest pain.  The various methods of treatment have been discussed with the patient and family. After consideration of risks, benefits and other options for treatment, the patient has consented to  Procedure(s): LEFT HEART CATH AND CORONARY ANGIOGRAPHY (N/A) as a surgical intervention.  The patient's history has been reviewed, patient examined, no change in status, stable for surgery.  I have reviewed the patient's chart and labs.  Questions were answered to the patient's satisfaction.   Cath Lab Visit (complete for each Cath Lab visit)  Clinical Evaluation Leading to the Procedure:   ACS: Yes.    Non-ACS:    Anginal Classification: CCS III  Anti-ischemic medical therapy: Minimal Therapy (1 class of medications)  Non-Invasive Test Results: No non-invasive testing performed  Prior CABG: No previous CABG        [name] 09/26/2022 9:25 AM

## 2022-09-26 NOTE — Progress Notes (Signed)
Discharge instructions (including medications) discussed with and copy provided to patient/caregiver 

## 2022-09-26 NOTE — Discharge Summary (Signed)
Physician Discharge Summary  Corey [name] JXB:[id number] DOB: 11/08/1945 DOA: 09/24/2022  PCP: Garlan Fillers, MD  Admit date: 09/24/2022 Discharge date: 09/26/2022  Admitted From: Home Disposition:  Home  Discharge Condition:Stable CODE STATUS:FULL Diet recommendation: Heart Healthy  Brief/Interim Summary: Patient  is a 77 y.o. male with medical history significant of CAD, HTN, and prostate/kidney cancer presenting with intermittent exertional chest pressure, relieved with NTG.  Admitted for further workup.  Cardiology consulted.  Troponins were negative.  Underwent left heart cath which showed mild nonobstructive disease.  Noncardiac pain suspected.  Currently cleared for discharge today.  Hemodynamically stable for discharge  Following problems were addressed during the hospitalization:  Chest pain -Patient with left-sided chest pain and substernal pressure that has come on intermittently for days to weeks possibly worse with exertion, possibly escalating, improved with NTG. -CXR unremarkable.   -Initial cardiac HS troponin negative with negative delta.  -EKG without STEMI.   -Underwent left heart cath which showed mild nonobstructive disease.  Noncardiac pain suspected. -Currently chest pain free HTN -Continue amlodipine -Hold losartan.Added low-dose beta-blocker -Will also add prn hydralazine   AKI on CKD stage 3 CKD -Baseline creatinine around 1.5.  Kidney function improved and currently at baseline   HLD -Continue pravastatin   Prostate/kidney cancer -Stage T2a prostate adenocarcinoma, completed treatment in 2022 -s/p partial nephrectomy in 2013  -Continue tamsulosin  Discharge Diagnoses:  Principal Problem:   Chest pain, rule out acute myocardial infarction Active Problems:   Essential hypertension   CKD (chronic kidney disease) stage 3, GFR 30-59 ml/min (HCC)   Malignant neoplasm of prostate (HCC)   Dyslipidemia    Discharge Instructions  Discharge  Instructions     Diet - low sodium heart healthy   Complete by: As directed    Discharge instructions   Complete by: As directed    1)Please take prescribed medications as instructed 2)Follow up with your PCP and cardiologist as an outpatient.   Increase activity slowly   Complete by: As directed       Allergies as of 09/26/2022       Reactions   Ultram [tramadol] Shortness Of Breath, Other (See Comments)   Elevated heart rate   Neurontin [gabapentin] Other (See Comments)   Dizziness    Aldara [imiquimod] Other (See Comments)   Blisters on application site.        Medication List     STOP taking these medications    losartan 100 MG tablet Commonly known as: COZAAR       TAKE these medications    ALPRAZolam 0.25 MG tablet Commonly known as: XANAX Take 0.25 mg by mouth at bedtime.   amLODipine 5 MG tablet Commonly known as: NORVASC TAKE 1 TABLET BY MOUTH EVERY DAY   aspirin EC 81 MG tablet Take 81 mg by mouth daily.   Centrum Silver 50+Men Tabs Take 1 tablet by mouth daily.   diclofenac 25 MG EC tablet Commonly known as: VOLTAREN Take 1 tablet (25 mg total) by mouth every 12 (twelve) hours as needed. What changed:  when to take this reasons to take this   docusate sodium 100 MG capsule Commonly known as: COLACE Take 1 capsule (100 mg total) by mouth 2 (two) times daily. What changed:  when to take this reasons to take this   fexofenadine 180 MG tablet Commonly known as: ALLEGRA Take 180 mg by mouth daily.   metoprolol tartrate 25 MG tablet Commonly known as: LOPRESSOR Take 0.5 tablets (12.5 mg  total) by mouth 2 (two) times daily.   potassium citrate 10 MEQ (1080 MG) SR tablet Commonly known as: UROCIT-K Take 20 mEq by mouth 2 (two) times daily.   pravastatin 40 MG tablet Commonly known as: PRAVACHOL Take 40 mg by mouth at bedtime.   tamsulosin 0.4 MG Caps capsule Commonly known as: FLOMAX Take 0.8 mg by mouth at bedtime.         Follow-up Information     Garlan Fillers, MD. Schedule an appointment as soon as possible for a visit in 1 week(s).   Specialty: Internal Medicine Contact information: [address] [place] [address] [phone]                Allergies  Allergen Reactions   Ultram [Tramadol] Shortness Of Breath and Other (See Comments)    Elevated heart rate   Neurontin [Gabapentin] Other (See Comments)    Dizziness    Aldara [Imiquimod] Other (See Comments)    Blisters on application site.    Consultations: Cardiology   Procedures/Studies: CARDIAC CATHETERIZATION  Result Date: 09/26/2022   Prox RCA-1 lesion is 30% stenosed.   Prox RCA-2 lesion is 30% stenosed.   LV end diastolic pressure is normal. Nonobstructive CAD Normal LVEDP Plan: consider noncardiac causes of chest pain. Anticipate DC today   ECHOCARDIOGRAM COMPLETE  Result Date: 09/25/2022    ECHOCARDIOGRAM REPORT   Patient Name:   Corey [name] Date of Exam: 09/25/2022 Medical Rec #:  [id number]       Height:       71.0 in Accession #:    [id number]      Weight:       197.5 lb Date of Birth:  09-19-1945        BSA:          2.097 m Patient Age:    76 years        BP:           156/88 mmHg Patient Gender: M               HR:           78 bpm. Exam Location:  Inpatient Procedure: 2D Echo, 3D Echo, Cardiac Doppler, Color Doppler, Strain Analysis and            Intracardiac Opacification Agent Indications:    R07.9* Chest pain, unspecified  History:        Patient has prior history of Echocardiogram examinations, most                 recent 08/03/2013. CAD, Prostate cancer, CKD,                 Signs/Symptoms:Chest Pain; Risk Factors:Hypertension.  Sonographer:    Jake Seats RDMS, RVT, RDCS Referring Phys: [id number] JENNIFER YATES IMPRESSIONS  1. Left ventricular ejection fraction, by estimation, is 50 to 55%. The left ventricle has low normal function. The left ventricle has no regional wall motion abnormalities. Left ventricular  diastolic parameters are consistent with Grade I diastolic dysfunction (impaired relaxation). The average left ventricular global longitudinal strain is -20.1 %. The global longitudinal strain is normal.  2. Right ventricular systolic function is normal. The right ventricular size is normal. There is normal pulmonary artery systolic pressure. The estimated right ventricular systolic pressure is 22.9 mmHg.  3. The mitral valve is normal in structure. Mild mitral valve regurgitation. No evidence of mitral stenosis.  4. The aortic valve is tricuspid. Aortic valve regurgitation  is trivial. No aortic stenosis is present.  5. The inferior vena cava is normal in size with greater than 50% respiratory variability, suggesting right atrial pressure of 3 mmHg. Comparison(s): Prior images unable to be directly viewed, comparison made by report only. No significant change from prior study. Echocardiogram one 08/03/13 showed an EF of 50-55%. FINDINGS  Left Ventricle: Left ventricular ejection fraction, by estimation, is 50 to 55%. The left ventricle has low normal function. The left ventricle has no regional wall motion abnormalities. Definity contrast agent was given IV to delineate the left ventricular endocardial borders. The average left ventricular global longitudinal strain is -20.1 %. The global longitudinal strain is normal. 3D ejection fraction reviewed and evaluated as part of the interpretation. Alternate measurement of EF is felt to be most reflective of LV function. The left ventricular internal cavity size was normal in size. There is no left ventricular hypertrophy. Abnormal (paradoxical) septal motion, consistent with left bundle branch block. Left ventricular diastolic parameters are consistent with Grade I diastolic dysfunction (impaired relaxation). Normal left ventricular filling pressure. Right Ventricle: The right ventricular size is normal. No increase in right ventricular wall thickness. Right ventricular  systolic function is normal. There is normal pulmonary artery systolic pressure. The tricuspid regurgitant velocity is 2.23 m/s, and  with an assumed right atrial pressure of 3 mmHg, the estimated right ventricular systolic pressure is 22.9 mmHg. Left Atrium: Left atrial size was normal in size. Right Atrium: Right atrial size was normal in size. Pericardium: There is no evidence of pericardial effusion. Mitral Valve: The mitral valve is normal in structure. Mild mitral valve regurgitation. No evidence of mitral valve stenosis. Tricuspid Valve: The tricuspid valve is normal in structure. Tricuspid valve regurgitation is trivial. Aortic Valve: The aortic valve is tricuspid. Aortic valve regurgitation is trivial. Aortic regurgitation PHT measures 1493 msec. No aortic stenosis is present. Aortic valve mean gradient measures 5.0 mmHg. Aortic valve peak gradient measures 8.9 mmHg. Aortic valve area, by VTI measures 2.10 cm. Pulmonic Valve: The pulmonic valve was not well visualized. Pulmonic valve regurgitation is not visualized. No evidence of pulmonic stenosis. Aorta: The aortic root is normal in size and structure. Venous: The inferior vena cava is normal in size with greater than 50% respiratory variability, suggesting right atrial pressure of 3 mmHg. IAS/Shunts: No atrial level shunt detected by color flow Doppler.  LEFT VENTRICLE PLAX 2D LVIDd:         5.20 cm     Diastology LVIDs:         3.30 cm     LV e' medial:    6.53 cm/s LV PW:         1.00 cm     LV E/e' medial:  9.1 LV IVS:        1.10 cm     LV e' lateral:   8.70 cm/s LVOT diam:     2.00 cm     LV E/e' lateral: 6.9 LV SV:         66 LV SV Index:   32          2D Longitudinal Strain LVOT Area:     3.14 cm    2D Strain GLS (A2C):   -19.9 %                            2D Strain GLS (A3C):   -20.6 %  2D Strain GLS (A4C):   -19.7 % LV Volumes (MOD)           2D Strain GLS Avg:     -20.1 % LV vol d, MOD A2C: 96.9 ml LV vol d, MOD A4C:  92.2 ml LV vol s, MOD A2C: 40.0 ml LV vol s, MOD A4C: 37.8 ml 3D Volume EF: LV SV MOD A2C:     57.0 ml 3D EF:        69 % LV SV MOD A4C:     92.2 ml LV EDV:       235 ml LV SV MOD BP:      55.0 ml LV ESV:       74 ml                            LV SV:        161 ml RIGHT VENTRICLE TAPSE (M-mode): 2.0 cm LEFT ATRIUM           Index        RIGHT ATRIUM           Index LA diam:      3.10 cm 1.48 cm/m   RA Area:     14.70 cm LA Vol (A4C): 25.0 ml 11.92 ml/m  RA Volume:   34.70 ml  16.54 ml/m  AORTIC VALVE AV Area (Vmax):    2.17 cm AV Area (Vmean):   2.20 cm AV Area (VTI):     2.10 cm AV Vmax:           149.00 cm/s AV Vmean:          104.000 cm/s AV VTI:            0.315 m AV Peak Grad:      8.9 mmHg AV Mean Grad:      5.0 mmHg LVOT Vmax:         103.00 cm/s LVOT Vmean:        72.800 cm/s LVOT VTI:          0.211 m LVOT/AV VTI ratio: 0.67 AI PHT:            1493 msec AR Vena Contracta: 0.30 cm  AORTA Ao Root diam: 3.40 cm Ao Asc diam:  3.80 cm MITRAL VALVE               TRICUSPID VALVE MV Area (PHT): 6.27 cm    TR Peak grad:   19.9 mmHg MV Decel Time: 121 msec    TR Vmax:        223.00 cm/s MR Peak grad: 100.0 mmHg MR Vmax:      500.00 cm/s  SHUNTS MV E velocity: 59.60 cm/s  Systemic VTI:  0.21 m MV A velocity: 95.70 cm/s  Systemic Diam: 2.00 cm MV E/A ratio:  0.62 Mihai Croitoru MD Electronically signed by Thurmon Fair MD Signature Date/Time: 09/25/2022/3:45:48 PM    Final    DG Chest 2 View  Result Date: 09/24/2022 CLINICAL DATA:  Chest pain EXAM: CHEST - 2 VIEW COMPARISON:  02/14/2021 FINDINGS: The heart size and mediastinal contours are within normal limits. Both lungs are clear. The visualized skeletal structures are unremarkable. IMPRESSION: No active cardiopulmonary disease. Electronically Signed   By: Jasmine Pang M.D.   On: 09/24/2022 22:08      Subjective: Patient seen and examined today.  Hemodynamically stable for discharge.  Currently chest pain-free.  Eager to  go home  Discharge  Exam: Vitals:   09/26/22 1216 09/26/22 1242  BP: 113/72   Pulse: (!) 53   Resp: 16   Temp:    SpO2: 95% 97%   Vitals:   09/26/22 1029 09/26/22 1051 09/26/22 1216 09/26/22 1242  BP: 126/78 117/69 113/72   Pulse:   (!) 53   Resp: 16  16   Temp:      TempSrc:      SpO2: 91%  95% 97%  Weight:      Height:        General: Pt is alert, awake, not in acute distress Cardiovascular: RRR, S1/S2 +, no rubs, no gallops Respiratory: CTA bilaterally, no wheezing, no rhonchi Abdominal: Soft, NT, ND, bowel sounds + Extremities: no edema, no cyanosis    The results of significant diagnostics from this hospitalization (including imaging, microbiology, ancillary and laboratory) are listed below for reference.     Microbiology: No results found for this or any previous visit (from the past 240 hour(s)).   Labs: BNP (last 3 results) No results for input(s): "BNP" in the last 8760 hours. Basic Metabolic Panel: Recent Labs  Lab 09/24/22 2145 09/26/22 0032 09/26/22 0639  NA 138 138 139  K 4.7 4.5 4.3  CL 106 109 109  CO2 23 20* 22  GLUCOSE 115* 97 97  BUN 32* 30* 29*  CREATININE 1.63* 1.75* 1.64*  CALCIUM 9.0 8.6* 8.9   Liver Function Tests: No results for input(s): "AST", "ALT", "ALKPHOS", "BILITOT", "PROT", "ALBUMIN" in the last 168 hours. No results for input(s): "LIPASE", "AMYLASE" in the last 168 hours. No results for input(s): "AMMONIA" in the last 168 hours. CBC: Recent Labs  Lab 09/24/22 2145 09/26/22 0032  WBC 4.7 5.7  HGB 15.1 14.8  HCT 43.9 43.7  MCV 94.0 93.6  PLT 181 164   Cardiac Enzymes: No results for input(s): "CKTOTAL", "CKMB", "CKMBINDEX", "TROPONINI" in the last 168 hours. BNP: Invalid input(s): "POCBNP" CBG: No results for input(s): "GLUCAP" in the last 168 hours. D-Dimer No results for input(s): "DDIMER" in the last 72 hours. Hgb A1c Recent Labs    09/26/22 0032  HGBA1C 5.3   Lipid Profile Recent Labs    09/26/22 0032  CHOL 154  HDL  48  LDLCALC 89  TRIG 87  CHOLHDL 3.2   Thyroid function studies Recent Labs    09/26/22 0032  TSH 1.174   Anemia work up No results for input(s): "VITAMINB12", "FOLATE", "FERRITIN", "TIBC", "IRON", "RETICCTPCT" in the last 72 hours. Urinalysis    Component Value Date/Time   COLORURINE AMBER (A) 10/23/2019 1232   APPEARANCEUR HAZY (A) 10/23/2019 1232   LABSPEC 1.024 10/23/2019 1232   PHURINE 5.0 10/23/2019 1232   GLUCOSEU NEGATIVE 10/23/2019 1232   HGBUR NEGATIVE 10/23/2019 1232   BILIRUBINUR SMALL (A) 10/23/2019 1232   KETONESUR NEGATIVE 10/23/2019 1232   PROTEINUR 30 (A) 10/23/2019 1232   UROBILINOGEN 0.2 04/10/2009 1625   NITRITE NEGATIVE 10/23/2019 1232   LEUKOCYTESUR NEGATIVE 10/23/2019 1232   Sepsis Labs Recent Labs  Lab 09/24/22 2145 09/26/22 0032  WBC 4.7 5.7   Microbiology No results found for this or any previous visit (from the past 240 hour(s)).  Please note: You were cared for by a hospitalist during your hospital stay. Once you are discharged, your primary care physician will handle any further medical issues. Please note that NO REFILLS for any discharge medications will be authorized once you are discharged, as it is imperative that you return  to your primary care physician (or establish a relationship with a primary care physician if you do not have one) for your post hospital discharge needs so that they can reassess your need for medications and monitor your lab values.    Time coordinating discharge: 40 minutes  SIGNED:   Burnadette Pop, MD  [facility] 09/26/2022, 1:12 PM Pager [phone]  If 7PM-7AM, please contact night-coverage [email or web address] Password [email or web address]

## 2022-09-26 NOTE — Progress Notes (Signed)
Rounding Note    Patient Name: Corey [name] Date of Encounter: 09/26/2022  [facility] Cardiologist: Peter Swaziland, MD   Subjective   Just returned from Cath Lab.  Cors clean, no chest pain or shortness of breath.  Inpatient Medications    Scheduled Meds:  ALPRAZolam  0.25 mg Oral QHS   amLODipine  5 mg Oral Daily   aspirin EC  81 mg Oral Daily   loratadine  10 mg Oral Daily   metoprolol tartrate  12.5 mg Oral BID   pravastatin  40 mg Oral QHS   sodium chloride flush  3 mL Intravenous Q12H   sodium chloride flush  3 mL Intravenous Q12H   sodium chloride flush  3 mL Intravenous Q12H   tamsulosin  0.8 mg Oral QHS   Continuous Infusions:  sodium chloride     sodium chloride     sodium chloride 1 mL/kg/hr (09/26/22 1054)   PRN Meds: sodium chloride, sodium chloride, acetaminophen, nitroGLYCERIN, ondansetron (ZOFRAN) IV, sodium chloride flush, sodium chloride flush, traZODone   Vital Signs    Vitals:   09/26/22 1010 09/26/22 1015 09/26/22 1029 09/26/22 1051  BP: 133/74 135/81 126/78 117/69  Pulse: 66 (!) 54    Resp: 18 13 16    Temp:      TempSrc:      SpO2: 93% 96% 91%   Weight:      Height:        Intake/Output Summary (Last 24 hours) at 09/26/2022 1057 Last data filed at 09/25/2022 2100 Gross per 24 hour  Intake 240 ml  Output 605 ml  Net -365 ml      09/26/2022    4:31 AM 09/25/2022   11:39 AM 02/26/2022    4:04 PM  Last 3 Weights  Weight (lbs) 195 lb 12.3 oz 197 lb 8 oz 201 lb 6.4 oz  Weight (kg) 88.8 kg 89.585 kg 91.354 kg      Telemetry    Normal sinus rhythm- Personally Reviewed  ECG    Normal sinus rhythm at 75 with low bundle-branch block.- Personally Reviewed  Physical Exam   GEN: No acute distress.   Neck: No JVD Cardiac: RRR, no murmurs, rubs, or gallops.  Respiratory: Clear to auscultation bilaterally. GI: Soft, nontender, non-distended  MS: No edema; No deformity. Neuro:  Nonfocal  Psych: Normal affect   Labs     High Sensitivity Troponin:   Recent Labs  Lab 09/24/22 2145 09/24/22 2345  TROPONINIHS 6 6     Chemistry Recent Labs  Lab 09/24/22 2145 09/26/22 0032 09/26/22 0639  NA 138 138 139  K 4.7 4.5 4.3  CL 106 109 109  CO2 23 20* 22  GLUCOSE 115* 97 97  BUN 32* 30* 29*  CREATININE 1.63* 1.75* 1.64*  CALCIUM 9.0 8.6* 8.9  GFRNONAA 43* 40* 43*  ANIONGAP 9 9 8     Lipids  Recent Labs  Lab 09/26/22 0032  CHOL 154  TRIG 87  HDL 48  LDLCALC 89  CHOLHDL 3.2    Hematology Recent Labs  Lab 09/24/22 2145 09/26/22 0032  WBC 4.7 5.7  RBC 4.67 4.67  HGB 15.1 14.8  HCT 43.9 43.7  MCV 94.0 93.6  MCH 32.3 31.7  MCHC 34.4 33.9  RDW 12.5 12.3  PLT 181 164   Thyroid  Recent Labs  Lab 09/26/22 0032  TSH 1.174    BNPNo results for input(s): "BNP", "PROBNP" in the last 168 hours.  DDimer No results for  input(s): "DDIMER" in the last 168 hours.   Radiology    CARDIAC CATHETERIZATION  Result Date: 09/26/2022   Prox RCA-1 lesion is 30% stenosed.   Prox RCA-2 lesion is 30% stenosed.   LV end diastolic pressure is normal. Nonobstructive CAD Normal LVEDP Plan: consider noncardiac causes of chest pain. Anticipate DC today   ECHOCARDIOGRAM COMPLETE  Result Date: 09/25/2022    ECHOCARDIOGRAM REPORT   Patient Name:   Corey [name] Date of Exam: 09/25/2022 Medical Rec #:  [id number]       Height:       71.0 in Accession #:    [id number]      Weight:       197.5 lb Date of Birth:  1945-04-10        BSA:          2.097 m Patient Age:    77 years        BP:           156/88 mmHg Patient Gender: M               HR:           78 bpm. Exam Location:  Inpatient Procedure: 2D Echo, 3D Echo, Cardiac Doppler, Color Doppler, Strain Analysis and            Intracardiac Opacification Agent Indications:    R07.9* Chest pain, unspecified  History:        Patient has prior history of Echocardiogram examinations, most                 recent 08/03/2013. CAD, Prostate cancer, CKD,                  Signs/Symptoms:Chest Pain; Risk Factors:Hypertension.  Sonographer:    Jake Seats RDMS, RVT, RDCS Referring Phys: [id number] JENNIFER YATES IMPRESSIONS  1. Left ventricular ejection fraction, by estimation, is 50 to 55%. The left ventricle has low normal function. The left ventricle has no regional wall motion abnormalities. Left ventricular diastolic parameters are consistent with Grade I diastolic dysfunction (impaired relaxation). The average left ventricular global longitudinal strain is -20.1 %. The global longitudinal strain is normal.  2. Right ventricular systolic function is normal. The right ventricular size is normal. There is normal pulmonary artery systolic pressure. The estimated right ventricular systolic pressure is 22.9 mmHg.  3. The mitral valve is normal in structure. Mild mitral valve regurgitation. No evidence of mitral stenosis.  4. The aortic valve is tricuspid. Aortic valve regurgitation is trivial. No aortic stenosis is present.  5. The inferior vena cava is normal in size with greater than 50% respiratory variability, suggesting right atrial pressure of 3 mmHg. Comparison(s): Prior images unable to be directly viewed, comparison made by report only. No significant change from prior study. Echocardiogram one 08/03/13 showed an EF of 50-55%. FINDINGS  Left Ventricle: Left ventricular ejection fraction, by estimation, is 50 to 55%. The left ventricle has low normal function. The left ventricle has no regional wall motion abnormalities. Definity contrast agent was given IV to delineate the left ventricular endocardial borders. The average left ventricular global longitudinal strain is -20.1 %. The global longitudinal strain is normal. 3D ejection fraction reviewed and evaluated as part of the interpretation. Alternate measurement of EF is felt to be most reflective of LV function. The left ventricular internal cavity size was normal in size. There is no left ventricular hypertrophy. Abnormal  (paradoxical) septal motion, consistent with left bundle branch block.  Left ventricular diastolic parameters are consistent with Grade I diastolic dysfunction (impaired relaxation). Normal left ventricular filling pressure. Right Ventricle: The right ventricular size is normal. No increase in right ventricular wall thickness. Right ventricular systolic function is normal. There is normal pulmonary artery systolic pressure. The tricuspid regurgitant velocity is 2.23 m/s, and  with an assumed right atrial pressure of 3 mmHg, the estimated right ventricular systolic pressure is 22.9 mmHg. Left Atrium: Left atrial size was normal in size. Right Atrium: Right atrial size was normal in size. Pericardium: There is no evidence of pericardial effusion. Mitral Valve: The mitral valve is normal in structure. Mild mitral valve regurgitation. No evidence of mitral valve stenosis. Tricuspid Valve: The tricuspid valve is normal in structure. Tricuspid valve regurgitation is trivial. Aortic Valve: The aortic valve is tricuspid. Aortic valve regurgitation is trivial. Aortic regurgitation PHT measures 1493 msec. No aortic stenosis is present. Aortic valve mean gradient measures 5.0 mmHg. Aortic valve peak gradient measures 8.9 mmHg. Aortic valve area, by VTI measures 2.10 cm. Pulmonic Valve: The pulmonic valve was not well visualized. Pulmonic valve regurgitation is not visualized. No evidence of pulmonic stenosis. Aorta: The aortic root is normal in size and structure. Venous: The inferior vena cava is normal in size with greater than 50% respiratory variability, suggesting right atrial pressure of 3 mmHg. IAS/Shunts: No atrial level shunt detected by color flow Doppler.  LEFT VENTRICLE PLAX 2D LVIDd:         5.20 cm     Diastology LVIDs:         3.30 cm     LV e' medial:    6.53 cm/s LV PW:         1.00 cm     LV E/e' medial:  9.1 LV IVS:        1.10 cm     LV e' lateral:   8.70 cm/s LVOT diam:     2.00 cm     LV E/e' lateral: 6.9  LV SV:         66 LV SV Index:   32          2D Longitudinal Strain LVOT Area:     3.14 cm    2D Strain GLS (A2C):   -19.9 %                            2D Strain GLS (A3C):   -20.6 %                            2D Strain GLS (A4C):   -19.7 % LV Volumes (MOD)           2D Strain GLS Avg:     -20.1 % LV vol d, MOD A2C: 96.9 ml LV vol d, MOD A4C: 92.2 ml LV vol s, MOD A2C: 40.0 ml LV vol s, MOD A4C: 37.8 ml 3D Volume EF: LV SV MOD A2C:     57.0 ml 3D EF:        69 % LV SV MOD A4C:     92.2 ml LV EDV:       235 ml LV SV MOD BP:      55.0 ml LV ESV:       74 ml  LV SV:        161 ml RIGHT VENTRICLE TAPSE (M-mode): 2.0 cm LEFT ATRIUM           Index        RIGHT ATRIUM           Index LA diam:      3.10 cm 1.48 cm/m   RA Area:     14.70 cm LA Vol (A4C): 25.0 ml 11.92 ml/m  RA Volume:   34.70 ml  16.54 ml/m  AORTIC VALVE AV Area (Vmax):    2.17 cm AV Area (Vmean):   2.20 cm AV Area (VTI):     2.10 cm AV Vmax:           149.00 cm/s AV Vmean:          104.000 cm/s AV VTI:            0.315 m AV Peak Grad:      8.9 mmHg AV Mean Grad:      5.0 mmHg LVOT Vmax:         103.00 cm/s LVOT Vmean:        72.800 cm/s LVOT VTI:          0.211 m LVOT/AV VTI ratio: 0.67 AI PHT:            1493 msec AR Vena Contracta: 0.30 cm  AORTA Ao Root diam: 3.40 cm Ao Asc diam:  3.80 cm MITRAL VALVE               TRICUSPID VALVE MV Area (PHT): 6.27 cm    TR Peak grad:   19.9 mmHg MV Decel Time: 121 msec    TR Vmax:        223.00 cm/s MR Peak grad: 100.0 mmHg MR Vmax:      500.00 cm/s  SHUNTS MV E velocity: 59.60 cm/s  Systemic VTI:  0.21 m MV A velocity: 95.70 cm/s  Systemic Diam: 2.00 cm MV E/A ratio:  0.62 Mihai Croitoru MD Electronically signed by Thurmon Fair MD Signature Date/Time: 09/25/2022/3:45:48 PM    Final    DG Chest 2 View  Result Date: 09/24/2022 CLINICAL DATA:  Chest pain EXAM: CHEST - 2 VIEW COMPARISON:  02/14/2021 FINDINGS: The heart size and mediastinal contours are within normal limits. Both  lungs are clear. The visualized skeletal structures are unremarkable. IMPRESSION: No active cardiopulmonary disease. Electronically Signed   By: Jasmine Pang M.D.   On: 09/24/2022 22:08    Cardiac Studies   2D echocardiogram (09/25/2022)  IMPRESSIONS     1. Left ventricular ejection fraction, by estimation, is 50 to 55%. The  left ventricle has low normal function. The left ventricle has no regional  wall motion abnormalities. Left ventricular diastolic parameters are  consistent with Grade I diastolic  dysfunction (impaired relaxation). The average left ventricular global  longitudinal strain is -20.1 %. The global longitudinal strain is normal.   2. Right ventricular systolic function is normal. The right ventricular  size is normal. There is normal pulmonary artery systolic pressure. The  estimated right ventricular systolic pressure is 22.9 mmHg.   3. The mitral valve is normal in structure. Mild mitral valve  regurgitation. No evidence of mitral stenosis.   4. The aortic valve is tricuspid. Aortic valve regurgitation is trivial.  No aortic stenosis is present.   5. The inferior vena cava is normal in size with greater than 50%  respiratory variability, suggesting right atrial pressure of 3 mmHg.  Cardiac catheterization (09/26/2022)  Conclusion      Prox RCA-1 lesion is 30% stenosed.   Prox RCA-2 lesion is 30% stenosed.   LV end diastolic pressure is normal.   Nonobstructive CAD Normal LVEDP   Plan: consider noncardiac causes of chest pain. Anticipate DC today   Dominance: Right    Patient Profile     Corey [name] is a 77 y.o. male with a hx of minimal CAD by cath 2011 (20-30% mRCA), CKD 3b, thoracic aortic aneurysm (4.3cm by CT 05/2019), chronic LBBB, prostate CA, kidney CA s/p partial nephrectomy 2013 who is being seen 09/25/2022 for the evaluation of chest pain at the request of Dr. Judd Lien.   Assessment & Plan    1: Chest pain-enzymes negative.  Cardiac cath  today showed mild nonobstructive disease.  Suspect pain was noncardiac.  2: CKD 3B-serum creatinine declined from 1.75 down to 1.64.  3: Ascending thoracic aortic aneurysm-followed closely as an outpatient by Dr. Swaziland.  Last CT 05/2019 was 4.3 cm  4: Essential hypertension-on amlodipine and beta-blocker.  5: Hyperlipidemia-on pravastatin.  Total cholesterol is 154 with an LDL of 89.  Patient pain-free.  Etiology unclear.  Coronary arteries unremarkable.  Okay for discharge home today.  Will arrange follow-up with Dr. Swaziland as an outpatient.  [facility] will sign off.   Medication Recommendations: Amlodipine added Other recommendations (labs, testing, etc): None Follow up as an outpatient: Will arrange outpatient follow-up with Dr. Swaziland.  For questions or updates, please contact [facility] Please consult [email or web address] for contact info under        Signed, Nanetta Batty, MD  09/26/2022, 10:57 AM

## 2022-09-26 NOTE — Plan of Care (Signed)
  Problem: Education: Goal: Knowledge of General Education information will improve Description Including pain rating scale, medication(s)/side effects and non-pharmacologic comfort measures Outcome: Adequate for Discharge   Problem: Health Behavior/Discharge Planning: Goal: Ability to manage health-related needs will improve Outcome: Adequate for Discharge   Problem: Clinical Measurements: Goal: Ability to maintain clinical measurements within normal limits will improve Outcome: Adequate for Discharge Goal: Will remain free from infection Outcome: Adequate for Discharge Goal: Diagnostic test results will improve Outcome: Adequate for Discharge Goal: Respiratory complications will improve Outcome: Adequate for Discharge Goal: Cardiovascular complication will be avoided Outcome: Adequate for Discharge   Problem: Activity: Goal: Risk for activity intolerance will decrease Outcome: Adequate for Discharge   Problem: Nutrition: Goal: Adequate nutrition will be maintained Outcome: Adequate for Discharge   Problem: Coping: Goal: Level of anxiety will decrease Outcome: Adequate for Discharge   Problem: Elimination: Goal: Will not experience complications related to bowel motility Outcome: Adequate for Discharge Goal: Will not experience complications related to urinary retention Outcome: Adequate for Discharge   Problem: Pain Managment: Goal: General experience of comfort will improve Outcome: Adequate for Discharge   Problem: Safety: Goal: Ability to remain free from injury will improve Outcome: Adequate for Discharge   Problem: Skin Integrity: Goal: Risk for impaired skin integrity will decrease Outcome: Adequate for Discharge   Problem: Education: Goal: Understanding of cardiac disease, CV risk reduction, and recovery process will improve Outcome: Adequate for Discharge Goal: Individualized Educational Video(s) Outcome: Adequate for Discharge   Problem:  Activity: Goal: Ability to tolerate increased activity will improve Outcome: Adequate for Discharge   Problem: Cardiac: Goal: Ability to achieve and maintain adequate cardiovascular perfusion will improve Outcome: Adequate for Discharge   Problem: Health Behavior/Discharge Planning: Goal: Ability to safely manage health-related needs after discharge will improve Outcome: Adequate for Discharge   Problem: Education: Goal: Understanding of CV disease, CV risk reduction, and recovery process will improve Outcome: Adequate for Discharge Goal: Individualized Educational Video(s) Outcome: Adequate for Discharge   Problem: Activity: Goal: Ability to return to baseline activity level will improve Outcome: Adequate for Discharge   Problem: Cardiovascular: Goal: Ability to achieve and maintain adequate cardiovascular perfusion will improve Outcome: Adequate for Discharge Goal: Vascular access site(s) Level 0-1 will be maintained Outcome: Adequate for Discharge   Problem: Health Behavior/Discharge Planning: Goal: Ability to safely manage health-related needs after discharge will improve Outcome: Adequate for Discharge   

## 2022-09-26 NOTE — Plan of Care (Signed)

## 2022-09-27 ENCOUNTER — Encounter (HOSPITAL_COMMUNITY): Payer: Self-pay | Admitting: Cardiology

## 2022-09-27 DIAGNOSIS — D0339 Melanoma in situ of other parts of face: Secondary | ICD-10-CM | POA: Diagnosis not present

## 2022-09-27 LAB — LIPOPROTEIN A (LPA): Lipoprotein (a): 60.7 nmol/L — ABNORMAL HIGH (ref <75.0)

## 2022-10-03 DIAGNOSIS — N1831 Chronic kidney disease, stage 3a: Secondary | ICD-10-CM | POA: Diagnosis not present

## 2022-10-03 DIAGNOSIS — I129 Hypertensive chronic kidney disease with stage 1 through stage 4 chronic kidney disease, or unspecified chronic kidney disease: Secondary | ICD-10-CM | POA: Diagnosis not present

## 2022-10-03 DIAGNOSIS — I739 Peripheral vascular disease, unspecified: Secondary | ICD-10-CM | POA: Diagnosis not present

## 2022-10-03 DIAGNOSIS — E1151 Type 2 diabetes mellitus with diabetic peripheral angiopathy without gangrene: Secondary | ICD-10-CM | POA: Diagnosis not present

## 2022-10-03 DIAGNOSIS — R0789 Other chest pain: Secondary | ICD-10-CM | POA: Diagnosis not present

## 2022-10-03 DIAGNOSIS — C61 Malignant neoplasm of prostate: Secondary | ICD-10-CM | POA: Diagnosis not present

## 2022-10-03 DIAGNOSIS — E785 Hyperlipidemia, unspecified: Secondary | ICD-10-CM | POA: Diagnosis not present

## 2022-10-03 DIAGNOSIS — K219 Gastro-esophageal reflux disease without esophagitis: Secondary | ICD-10-CM | POA: Diagnosis not present

## 2022-10-03 DIAGNOSIS — I7121 Aneurysm of the ascending aorta, without rupture: Secondary | ICD-10-CM | POA: Diagnosis not present

## 2022-10-12 NOTE — Progress Notes (Unsigned)
Cardiology Office Note    Date:  10/16/2022   ID:  Corey [name], DOB 1945-03-31, MRN [id number]  PCP:  Garlan Fillers, MD  Cardiologist: Dr. [place]  Chief Complaint  Patient presents with   Chest Pain     History of Present Illness:    Corey [name] is a 77 y.o. male with past medical history of thoracic aortic aneurysm, Stage 3 CKD, chronic LBBB, and minimal CAD by cath in 2011 who is seen for post hospital follow up   He was seen in the ED on Feb 13, 2021 with complaints of chest pain. CXR showed no change. Ecg showed chronic LBBB. Creatinine improved to 1.69. troponins were negative x 2.  He did undergo lumbar fusion surgery in Jan 2023.  Patient was recently admitted 7/8-7/10/24 for evaluation of chest pain. Troponin levels normal. Ecg without acute change. Echo was normal. Cardiac cath showing minimal CAD. His last CT chest in March 2021 showed no change in thoracic dimension of 4.3 cm. When he was hospitalized he was switched from losartan to metoprolol but notes he is much more fatigued on this. He states his chest pain resolved with change in his reflux therapy and eating better.    Past Medical History:  Diagnosis Date   Arthritis 10/22/2011   hx. osteoarthritis-knees   Ascending aortic aneurysm (HCC)    Coronary artery disease    minimal per cath in 2011   Dyspnea    GERD (gastroesophageal reflux disease)    Heart murmur    History of kidney cancer    History of kidney stones    "YRS AGO"  nothing in last 10 yrs   Hypertension    Left bundle branch block    Prostate cancer (HCC)    Skin cancer     Past Surgical History:  Procedure Laterality Date   APPENDECTOMY     BACK SURGERY     BELPHAROPTOSIS REPAIR Bilateral    BUNIONECTOMY Right    CARDIAC CATHETERIZATION  12/22/2009   EF60%/minimal nonobstructive atherosclerotic coronary artery disease/normal lt ventricular function/mod aortic root dilatation   CYSTOSCOPY WITH RETROGRADE PYELOGRAM,  URETEROSCOPY AND STENT PLACEMENT Left 07/28/2012   Procedure: CYSTOSCOPY WITH RETROGRADE PYELOGRAM, LEFT URETEROSCOPY ;  Surgeon: Crecencio Mc, MD;  Location: [facility];  Service: Urology;  Laterality: Left;   CYSTOSCOPY/RETROGRADE/URETEROSCOPY/STONE EXTRACTION WITH BASKET  10/22/2011   1'12   CYSTOSCOPY/URETEROSCOPY/HOLMIUM LASER/STENT PLACEMENT Right 02/18/2020   Procedure: CYSTOSCOPY/RETROGRADE/URETEROSCOPY/HOLMIUM LASER/STENT PLACEMENT;  Surgeon: Heloise Purpura, MD;  Location: [facility];  Service: Urology;  Laterality: Right;   ELBOW SURGERY Right 1990s?   "tendon repaired"   JOINT REPLACEMENT     both knees & R hip   KNEE ARTHROSCOPY     "? side"   LEFT HEART CATH AND CORONARY ANGIOGRAPHY N/A 09/26/2022   Procedure: LEFT HEART CATH AND CORONARY ANGIOGRAPHY;  Surgeon: [place], Derec Mozingo M, MD;  Location: [facility] IN[facility] CV [facility];  Service: Cardiovascular;  Laterality: N/A;   LIPOMA EXCISION Left    wrist   LUMBAR SPINE SURGERY  2003   Lumbar fusion -retained hardware([name])   PARTIAL NEPHRECTOMY Right 10/2011   skin cancer removal     TONSILLECTOMY     TOTAL HIP ARTHROPLASTY Right 03/26/2017   Procedure: RIGHT TOTAL HIP ARTHROPLASTY ANTERIOR APPROACH;  Surgeon: Sheral Apley, MD;  Location: [facility];  Service: Orthopedics;  Laterality: Right;   TOTAL KNEE ARTHROPLASTY     bilateral   TRIGGER FINGER RELEASE Right  Current Medications: Outpatient Medications Prior to Visit  Medication Sig Dispense Refill   ALPRAZolam (XANAX) 0.25 MG tablet Take 0.25 mg by mouth at bedtime.     amLODipine (NORVASC) 5 MG tablet TAKE 1 TABLET BY MOUTH EVERY DAY 90 tablet 2   docusate sodium (COLACE) 100 MG capsule Take 1 capsule (100 mg total) by mouth 2 (two) times daily. (Patient taking differently: Take 100 mg by mouth daily as needed (constipation).) 60 capsule 0   fexofenadine (ALLEGRA) 180 MG tablet Take 180 mg by mouth daily.     Multiple Vitamins-Minerals (CENTRUM SILVER 50+MEN) TABS Take 1 tablet by  mouth daily.     potassium citrate (UROCIT-K) 10 MEQ (1080 MG) SR tablet Take 20 mEq by mouth 2 (two) times daily.     pravastatin (PRAVACHOL) 40 MG tablet Take 40 mg by mouth at bedtime.   3   tamsulosin (FLOMAX) 0.4 MG CAPS capsule Take 0.8 mg by mouth at bedtime.   11   diclofenac (VOLTAREN) 25 MG EC tablet Take 1 tablet (25 mg total) by mouth every 12 (twelve) hours as needed.     metoprolol tartrate (LOPRESSOR) 25 MG tablet Take 0.5 tablets (12.5 mg total) by mouth 2 (two) times daily. 60 tablet 0   aspirin EC 81 MG tablet Take 81 mg by mouth daily.  (Patient not taking: Reported on 10/16/2022)     No facility-administered medications prior to visit.     Allergies:   Ultram [tramadol], Neurontin [gabapentin], and Aldara [imiquimod]   Social History   Socioeconomic History   Marital status: Married    Spouse name: Barth Kirks   Number of children: 3   Years of education: Not on file   Highest education level: Not on file  Occupational History   Occupation: sales [employer]    Comment: Transport planner  Tobacco Use   Smoking status: Never   Smokeless tobacco: Never  Vaping Use   Vaping status: Never Used  Substance and Sexual Activity   Alcohol use: No   Drug use: No   Sexual activity: Yes  Other Topics Concern   Not on file  Social History Narrative   3 grown children: Schuyler Amor, and Carlena Bjornstad.   Social Determinants of Health   Financial Resource Strain: Low Risk  (04/04/2021)   Overall Financial Resource Strain (CARDIA)    Difficulty of Paying Living Expenses: Not hard at all  Food Insecurity: No Food Insecurity (09/25/2022)   Hunger Vital Sign    Worried About Running Out of Food in the Last Year: Never true    Ran Out of Food in the Last Year: Never true  Transportation Needs: No Transportation Needs (09/25/2022)   PRAPARE - Administrator, Civil Service (Medical): No    Lack of Transportation (Non-Medical): No  Physical Activity: Sufficiently Active  (04/04/2021)   Exercise Vital Sign    Days of Exercise per Week: 3 days    Minutes of Exercise per Session: 60 min  Stress: No Stress Concern Present (04/04/2021)   [employer] of Occupational Health - Occupational Stress Questionnaire    Feeling of Stress : Not at all  Social Connections: Socially Integrated (04/04/2021)   Social Connection and Isolation Panel [NHANES]    Frequency of Communication with Friends and Family: More than three times a week    Frequency of Social Gatherings with Friends and Family: Three times a week    Attends Religious Services: 1 to 4 times per year  Active Member of Clubs or Organizations: Yes    Attends Banker Meetings: 1 to 4 times per year    Marital Status: Married     Family History:  The patient's family history includes Cancer in his father; Heart attack in his brother.   Review of Systems:   As noted in HPI. All other systems reviewed and are otherwise negative except as noted above.   Physical Exam:    VS:  BP 102/60   Pulse (!) 56   Ht 5' 11.5" (1.816 m)   Wt 197 lb (89.4 kg)   SpO2 94%   BMI 27.09 kg/m    GENERAL:  Well appearing WM in NAD HEENT:  PERRL, EOMI, sclera are clear. Oropharynx is clear. NECK:  No jugular venous distention, carotid upstroke brisk and symmetric, no bruits, no thyromegaly or adenopathy LUNGS:  Clear to auscultation bilaterally CHEST:  Unremarkable HEART:  RRR,  PMI not displaced or sustained,S1 and S2 within normal limits, no S3, no S4: no clicks, no rubs, gr 1-2/6 systolic murmur RUSB. ABD:  Soft, nontender. BS +, no masses or bruits. No hepatomegaly, no splenomegaly EXT:  2 + pulses throughout, no edema, no cyanosis no clubbing SKIN:  Warm and dry.  No rashes NEURO:  Alert and oriented x 3. Cranial nerves II through XII intact. PSYCH:  Cognitively intact      Wt Readings from Last 3 Encounters:  10/16/22 197 lb (89.4 kg)  09/26/22 195 lb 12.3 oz (88.8 kg)  02/26/22 201 lb 6.4  oz (91.4 kg)     Studies/Labs Reviewed:   EKG Interpretation Date/Time:  Tuesday October 16 2022 13:19:09 EDT Ventricular Rate:  56 PR Interval:  184 QRS Duration:  178 QT Interval:  486 QTC Calculation: 468 R Axis:   -43  Text Interpretation: Sinus bradycardia Left axis deviation Left bundle branch block When compared with ECG of 26-Sep-2022 04:57, Premature supraventricular complexes are no longer Present Confirmed by [place], Vivan Agostino [phone]) on 10/16/2022 1:26:16 PM     Recent Labs: 09/26/2022: BUN 29; Creatinine, Ser 1.64; Hemoglobin 14.8; Platelets 164; Potassium 4.3; Sodium 139; TSH 1.174   Lipid Panel    Component Value Date/Time   CHOL 154 09/26/2022 0032   TRIG 87 09/26/2022 0032   HDL 48 09/26/2022 0032   CHOLHDL 3.2 09/26/2022 0032   VLDL 17 09/26/2022 0032   LDLCALC 89 09/26/2022 0032     Labs dated 01/31/18: cholesterol 147, triglycerides 130, HDL 44, LDL 77. A1c 5.1%. Creatinine 1.4. otherwise chemistries and Hgb normal.  Dated 02/10/19: cholesterol 136, triglycerides 118, HDL 47, LDL 65. A1c 5.2%. creatinine 1.2. otherwise CMET and CBC normal Dated 02/22/20: cholesterol 128, triglycerides 207, HDL 37, LDL 50. A1c 5.2%. BUN 27, creatinine 2.31. otherwise CMET and CBC normal.  Dated 04/14/21: creatinine 1.38. Hgb 12.1. Dated 06/06/21: cholesterol 146, triglycerides 97, HDL 44, LDL 83. A1c 5.3%. LFTs normal.   Additional studies/ records that were reviewed today include:   Cardiac Catheterization: 2011    Echocardiogram: 07/2013 Study Conclusions  - Left ventricle: Abnormal septal motion. The cavity size was   normal. Wall thickness was increased in a pattern of moderate   LVH. Systolic function was normal. The estimated ejection   fraction was in the range of 50% to 55%. Doppler parameters are   consistent with abnormal left ventricular relaxation (grade 1   diastolic dysfunction). - Atrial septum: No defect or patent foramen ovale was identified.   CT  ANGIOGRAPHY CHEST WITH  CONTRAST   TECHNIQUE: Multidetector CT imaging of the chest was performed using the standard protocol during bolus administration of intravenous contrast. Multiplanar CT image reconstructions and MIPs were obtained to evaluate the vascular anatomy.   CONTRAST:  80mL ISOVUE-370 IOPAMIDOL (ISOVUE-370) INJECTION 76%   COMPARISON:  Prior CTA of the chest 04/06/2016   FINDINGS: Cardiovascular: Stable fusiform aneurysmal dilatation of the ascending thoracic aorta of with a transverse diameter 4.5 cm. Conventional 3 vessel arch anatomy. No significant atherosclerotic plaque. The descending and transverse thoracic aorta are normal in caliber. No effacement of the sino-tubular junction. Unremarkable aortic root. The heart is normal in size. No pericardial effusion. Unremarkable pulmonary arteries.   Mediastinum/Nodes: Unremarkable CT appearance of the thyroid gland. No suspicious mediastinal or hilar adenopathy. No soft tissue mediastinal mass. The thoracic esophagus is unremarkable.   Lungs/Pleura: Lungs are clear. No pleural effusion or pneumothorax.   Upper Abdomen: Visualized upper abdominal organs are unremarkable. Stable hyperdense cyst in the upper pole of the right kidney compared to 01/25/2015.   Musculoskeletal: No acute fracture or aggressive appearing lytic or blastic osseous lesion.   Review of the MIP images confirms the above findings.   IMPRESSION: Stable 4.5 cm fusiform ascending thoracic aortic aneurysm. No interval change. Ascending thoracic aortic aneurysm. Recommend semi-annual imaging followup by CTA or MRA and referral to cardiothoracic surgery if not already obtained. This recommendation follows 2010 ACCF/AHA/AATS/ACR/ASA/SCA/SCAI/SIR/STS/SVM Guidelines for the Diagnosis and Management of Patients With Thoracic Aortic Disease. Circulation. 2010; 121: Z610-R604   Signed,   Sterling Big, MD   [facility]   [facility]     Electronically Signed   By: Malachy Moan M.D.   On: 04/29/2017 16:38  CLINICAL DATA:  Follow-up thoracic aortic aneurysm.  CT 04/29/2017   EXAM: CT ANGIOGRAPHY CHEST WITH CONTRAST   TECHNIQUE: Multidetector CT imaging of the chest was performed using the standard protocol during bolus administration of intravenous contrast. Multiplanar CT image reconstructions and MIPs were obtained to evaluate the vascular anatomy.   CONTRAST:  OMNIPAQUE IOHEXOL 350 MG/ML SOLN   COMPARISON:  CT 04/29/2017.   FINDINGS: Cardiovascular: Ascending thoracic aorta measures 4.3 cm in maximum dimension on sagittal image 100/10. This compares to 4.3 cm on comparison exam. Aneurysm measures 4.2 cm on axial image (80/16) and 4.1 cm on coronal imaging.   The descending thoracic aorta is normal caliber at 2.5 cm.   Mediastinum/Nodes: No axillary or supraclavicular adenopathy. No mediastinal hilar adenopathy. No pericardial effusion. Esophagus normal.   Lungs/Pleura: No suspicious pulmonary nodules. Mild subpleural nodularity in the superior aspect of the RIGHT lower lobe (image 60/7 and 67/7) slightly more prominent but favored benign.   Upper Abdomen: Limited view of the liver, kidneys, pancreas are unremarkable. Normal adrenal glands. Stable mixed density cysts in the upper poles of the kidneys.   Musculoskeletal: No aggressive osseous lesion   Review of the MIP images confirms the above findings.   IMPRESSION: 1. Stable ascending thoracic aortic aneurysm at 4.3 cm. Recommend annual imaging followup by CTA or MRA. This recommendation follows 2010 ACCF/AHA/AATS/ACR/ASA/SCA/SCAI/SIR/STS/SVM Guidelines for the Diagnosis and Management of Patients with Thoracic Aortic Disease. Circulation. 2010; 121: V409-W119. Aortic aneurysm NOS (ICD10-I71.9). 2. No acute pulmonary parenchymal findings.     Electronically Signed   By: Genevive Bi M.D.   On: 05/21/2019 08:46   Echo 09/25/22: IMPRESSIONS     1. Left ventricular ejection fraction, by estimation, is 50 to 55%. The  left  ventricle has low normal function. The left ventricle has no regional  wall motion abnormalities. Left ventricular diastolic parameters are  consistent with Grade I diastolic  dysfunction (impaired relaxation). The average left ventricular global  longitudinal strain is -20.1 %. The global longitudinal strain is normal.   2. Right ventricular systolic function is normal. The right ventricular  size is normal. There is normal pulmonary artery systolic pressure. The  estimated right ventricular systolic pressure is 22.9 mmHg.   3. The mitral valve is normal in structure. Mild mitral valve  regurgitation. No evidence of mitral stenosis.   4. The aortic valve is tricuspid. Aortic valve regurgitation is trivial.  No aortic stenosis is present.   5. The inferior vena cava is normal in size with greater than 50%  respiratory variability, suggesting right atrial pressure of 3 mmHg.   Comparison(s): Prior images unable to be directly viewed, comparison made  by report only. No significant change from prior study. Echocardiogram one  08/03/13 showed an EF of 50-55%.   Cardiac cath 09/26/22:  LEFT HEART CATH AND CORONARY ANGIOGRAPHY   Conclusion      Prox RCA-1 lesion is 30% stenosed.   Prox RCA-2 lesion is 30% stenosed.   LV end diastolic pressure is normal.   Nonobstructive CAD Normal LVEDP   Plan: consider noncardiac causes of chest pain. Anticipate DC today  Assessment:    1. Chest pain of uncertain etiology   2. Aneurysm of ascending aorta without rupture (HCC)   3. LBBB (left bundle branch block)   4. Essential hypertension   5. Hyperlipidemia LDL goal <70          Plan:   In order of problems listed above:  1. HTN - BP is well controlled. - continue Amlodipine 5mg  daily and resume Losartan 100mg  daily. Avoid use of  NSAIDs with CKD.  - stop metoprolol due to fatigue  2. LBBB - chronic for 20+ years. Catheterization in July  showed minimal CAD. Normal LV function - continue statin therapy.   3. HLD - Remains on Pravastatin 40 mg daily. LDL 89 on recent hospital stay.   4. Thoracic Aortic Aneurysm -  CT in March 2021 showed no change with dimension of 4.3 cm. - aortic dimensions have been stable on CT since 2011. Would suggest we can reduce surveillance. Will plan on rechecking next year  5. Stage 3 CKD  6.  Prostate CA. S/p RT  7.  GERD    Signed, Bain Whichard [place], MD  10/16/2022 1:41 PM    [facility] [address] [place], [place] [address] Phone: [phone]

## 2022-10-16 ENCOUNTER — Encounter: Payer: Self-pay | Admitting: Cardiology

## 2022-10-16 ENCOUNTER — Ambulatory Visit: Payer: Medicare HMO | Attending: Cardiology | Admitting: Cardiology

## 2022-10-16 VITALS — BP 102/60 | HR 56 | Ht 71.5 in | Wt 197.0 lb

## 2022-10-16 DIAGNOSIS — I447 Left bundle-branch block, unspecified: Secondary | ICD-10-CM

## 2022-10-16 DIAGNOSIS — R079 Chest pain, unspecified: Secondary | ICD-10-CM | POA: Diagnosis not present

## 2022-10-16 DIAGNOSIS — E785 Hyperlipidemia, unspecified: Secondary | ICD-10-CM

## 2022-10-16 DIAGNOSIS — I10 Essential (primary) hypertension: Secondary | ICD-10-CM | POA: Diagnosis not present

## 2022-10-16 DIAGNOSIS — I7121 Aneurysm of the ascending aorta, without rupture: Secondary | ICD-10-CM | POA: Diagnosis not present

## 2022-10-16 MED ORDER — PANTOPRAZOLE SODIUM 40 MG PO TBEC: 40.0000 mg | DELAYED_RELEASE_TABLET | Freq: Every day | ORAL | Status: AC

## 2022-10-16 MED ORDER — LOSARTAN POTASSIUM 100 MG PO TABS: 100.0000 mg | ORAL_TABLET | Freq: Every day | ORAL | 3 refills | Status: DC

## 2022-10-16 NOTE — Patient Instructions (Signed)
Medication Instructions:  Stop Metoprolol  Start Losartan 100 mg daily Continue all other medications *If you need a refill on your cardiac medications before your next appointment, please call your pharmacy*   Lab Work: None ordered   Testing/Procedures: None ordered   Follow-Up: At [facility], you and your health needs are our priority.  As part of our continuing mission to provide you with exceptional heart care, we have created designated Provider Care Teams.  These Care Teams include your primary Cardiologist (physician) and Advanced Practice Providers (APPs -  Physician Assistants and Nurse Practitioners) who all work together to provide you with the care you need, when you need it.  We recommend signing up for the patient portal called "MyChart".  Sign up information is provided on this After Visit Summary.  MyChart is used to connect with patients for Virtual Visits (Telemedicine).  Patients are able to view lab/test results, encounter notes, upcoming appointments, etc.  Non-urgent messages can be sent to your provider as well.   To learn more about what you can do with MyChart, go to [email or web address].    Your next appointment:  6 to 8 months     Call in Sept to schedule Jan or Feb appointment    Provider:  Dr.Jordan

## 2022-11-06 ENCOUNTER — Telehealth: Payer: Self-pay | Admitting: Cardiology

## 2022-11-06 DIAGNOSIS — M5136 Other intervertebral disc degeneration, lumbar region: Secondary | ICD-10-CM | POA: Diagnosis not present

## 2022-11-06 NOTE — Telephone Encounter (Signed)
Spoke to patient he stated when he saw back Dr.this morning B/P 95/65.Stated he rechecked B/P at [facility] 117/77 pulse 77.He feels fine. He will continue same medications and monitor B/P daily for 2 weeks and call back with readings.I will make Dr.Jordan aware.

## 2022-11-06 NOTE — Telephone Encounter (Signed)
New Message:     Patient said he went to his back doctor and his blood pressure was 95/65. He thought this was low and wonder if heeded to make an adjustment in his blood pressure medicine.    Pt c/o BP issue: STAT if pt c/o blurred vision, one-sided weakness or slurred speech  1. What are your last 5 BP readings? 95/65 this morning  2. Are you having any other symptoms (ex. Dizziness, headache, blurred vision, passed out)? lightheaded  3. What is your BP issue? Patient says he thinks this low for his blood pressure

## 2022-12-04 DIAGNOSIS — M48062 Spinal stenosis, lumbar region with neurogenic claudication: Secondary | ICD-10-CM | POA: Diagnosis not present

## 2022-12-04 DIAGNOSIS — M5136 Other intervertebral disc degeneration, lumbar region: Secondary | ICD-10-CM | POA: Diagnosis not present

## 2022-12-17 DIAGNOSIS — M48062 Spinal stenosis, lumbar region with neurogenic claudication: Secondary | ICD-10-CM | POA: Diagnosis not present

## 2023-01-22 DIAGNOSIS — H5213 Myopia, bilateral: Secondary | ICD-10-CM | POA: Diagnosis not present

## 2023-01-22 DIAGNOSIS — H52203 Unspecified astigmatism, bilateral: Secondary | ICD-10-CM | POA: Diagnosis not present

## 2023-01-22 DIAGNOSIS — H31003 Unspecified chorioretinal scars, bilateral: Secondary | ICD-10-CM | POA: Diagnosis not present

## 2023-01-22 DIAGNOSIS — H2513 Age-related nuclear cataract, bilateral: Secondary | ICD-10-CM | POA: Diagnosis not present

## 2023-02-19 DIAGNOSIS — M48062 Spinal stenosis, lumbar region with neurogenic claudication: Secondary | ICD-10-CM | POA: Diagnosis not present

## 2023-02-27 DIAGNOSIS — C61 Malignant neoplasm of prostate: Secondary | ICD-10-CM | POA: Diagnosis not present

## 2023-03-06 DIAGNOSIS — R3912 Poor urinary stream: Secondary | ICD-10-CM | POA: Diagnosis not present

## 2023-03-06 DIAGNOSIS — N401 Enlarged prostate with lower urinary tract symptoms: Secondary | ICD-10-CM | POA: Diagnosis not present

## 2023-03-06 DIAGNOSIS — C61 Malignant neoplasm of prostate: Secondary | ICD-10-CM | POA: Diagnosis not present

## 2023-05-02 DIAGNOSIS — K219 Gastro-esophageal reflux disease without esophagitis: Secondary | ICD-10-CM | POA: Diagnosis not present

## 2023-05-04 ENCOUNTER — Emergency Department (HOSPITAL_BASED_OUTPATIENT_CLINIC_OR_DEPARTMENT_OTHER)
Admission: EM | Admit: 2023-05-04 | Discharge: 2023-05-05 | Disposition: A | Discharge status: Home / Self Care | Payer: Medicare HMO | Attending: Emergency Medicine | Admitting: Emergency Medicine

## 2023-05-04 ENCOUNTER — Other Ambulatory Visit: Payer: Self-pay

## 2023-05-04 ENCOUNTER — Emergency Department (HOSPITAL_BASED_OUTPATIENT_CLINIC_OR_DEPARTMENT_OTHER): Payer: Medicare HMO

## 2023-05-04 DIAGNOSIS — N200 Calculus of kidney: Secondary | ICD-10-CM | POA: Insufficient documentation

## 2023-05-04 DIAGNOSIS — K5669 Other partial intestinal obstruction: Secondary | ICD-10-CM | POA: Insufficient documentation

## 2023-05-04 DIAGNOSIS — R1084 Generalized abdominal pain: Secondary | ICD-10-CM | POA: Diagnosis present

## 2023-05-04 DIAGNOSIS — I700 Atherosclerosis of aorta: Secondary | ICD-10-CM | POA: Insufficient documentation

## 2023-05-04 DIAGNOSIS — Z85828 Personal history of other malignant neoplasm of skin: Secondary | ICD-10-CM | POA: Insufficient documentation

## 2023-05-04 DIAGNOSIS — K56600 Partial intestinal obstruction, unspecified as to cause: Secondary | ICD-10-CM

## 2023-05-04 DIAGNOSIS — I129 Hypertensive chronic kidney disease with stage 1 through stage 4 chronic kidney disease, or unspecified chronic kidney disease: Secondary | ICD-10-CM | POA: Insufficient documentation

## 2023-05-04 DIAGNOSIS — Z8546 Personal history of malignant neoplasm of prostate: Secondary | ICD-10-CM | POA: Insufficient documentation

## 2023-05-04 DIAGNOSIS — I2510 Atherosclerotic heart disease of native coronary artery without angina pectoris: Secondary | ICD-10-CM | POA: Insufficient documentation

## 2023-05-04 DIAGNOSIS — Z79899 Other long term (current) drug therapy: Secondary | ICD-10-CM | POA: Insufficient documentation

## 2023-05-04 DIAGNOSIS — R109 Unspecified abdominal pain: Secondary | ICD-10-CM | POA: Diagnosis not present

## 2023-05-04 DIAGNOSIS — N1831 Chronic kidney disease, stage 3a: Secondary | ICD-10-CM | POA: Insufficient documentation

## 2023-05-04 LAB — COMPREHENSIVE METABOLIC PANEL
ALT: 13 U/L (ref 0–44)
AST: 13 U/L — ABNORMAL LOW (ref 15–41)
Albumin: 4.6 g/dL (ref 3.5–5.0)
Alkaline Phosphatase: 111 U/L (ref 38–126)
Anion gap: 11 (ref 5–15)
BUN: 28 mg/dL — ABNORMAL HIGH (ref 8–23)
CO2: 22 mmol/L (ref 22–32)
Calcium: 9.3 mg/dL (ref 8.9–10.3)
Chloride: 108 mmol/L (ref 98–111)
Creatinine, Ser: 1.8 mg/dL — ABNORMAL HIGH (ref 0.61–1.24)
GFR, Estimated: 38 mL/min — ABNORMAL LOW (ref >60)
Glucose, Bld: 150 mg/dL — ABNORMAL HIGH (ref 70–99)
Potassium: 3.9 mmol/L (ref 3.5–5.1)
Sodium: 141 mmol/L (ref 135–145)
Total Bilirubin: 1 mg/dL (ref 0.0–1.2)
Total Protein: 7.3 g/dL (ref 6.5–8.1)

## 2023-05-04 LAB — URINALYSIS, ROUTINE W REFLEX MICROSCOPIC
Bacteria, UA: NONE SEEN
Bilirubin Urine: NEGATIVE
Glucose, UA: NEGATIVE mg/dL
Hgb urine dipstick: NEGATIVE
Ketones, ur: NEGATIVE mg/dL
Leukocytes,Ua: NEGATIVE
Nitrite: NEGATIVE
Specific Gravity, Urine: 1.017 (ref 1.005–1.030)
pH: 5.5 (ref 5.0–8.0)

## 2023-05-04 LAB — TROPONIN I (HIGH SENSITIVITY): Troponin I (High Sensitivity): 5 ng/L (ref <18)

## 2023-05-04 LAB — CBC
HCT: 48.8 % (ref 39.0–52.0)
Hemoglobin: 17.4 g/dL — ABNORMAL HIGH (ref 13.0–17.0)
MCH: 32.3 pg (ref 26.0–34.0)
MCHC: 35.7 g/dL (ref 30.0–36.0)
MCV: 90.5 fL (ref 80.0–100.0)
Platelets: 216 10*3/uL (ref 150–400)
RBC: 5.39 MIL/uL (ref 4.22–5.81)
RDW: 12.1 % (ref 11.5–15.5)
WBC: 9.3 10*3/uL (ref 4.0–10.5)
nRBC: 0 % (ref 0.0–0.2)

## 2023-05-04 LAB — RESP PANEL BY RT-PCR (RSV, FLU A&B, COVID)  RVPGX2
Influenza A by PCR: NEGATIVE
Influenza B by PCR: NEGATIVE
Resp Syncytial Virus by PCR: NEGATIVE
SARS Coronavirus 2 by RT PCR: NEGATIVE

## 2023-05-04 LAB — LIPASE, BLOOD: Lipase: 20 U/L (ref 11–51)

## 2023-05-04 NOTE — ED Provider Notes (Signed)
 [place] EMERGENCY DEPARTMENT AT [facility] Provider Note  CSN: [id number] Arrival date & time: 05/04/23 2204  Chief Complaint(s) Abdominal Pain  HPI Corey [name] is a 78 y.o. male     Abdominal Pain Pain location:  Generalized Pain quality: cramping   Pain radiates to:  Does not radiate Pain severity:  Severe Onset quality:  Gradual Duration:  9 hours Timing:  Constant Progression:  Waxing and waning Chronicity:  New Relieved by:  Nothing Worsened by:  Nothing Associated symptoms: nausea and vomiting (one when he arrived to the ED)   Associated symptoms: no diarrhea, no dysuria, no fatigue, no fever and no shortness of breath   Associated symptoms comment:  Abdominal distension   Past Medical History Past Medical History:  Diagnosis Date   Arthritis 10/22/2011   hx. osteoarthritis-knees   Ascending aortic aneurysm (HCC)    Coronary artery disease    minimal per cath in 2011   Dyspnea    GERD (gastroesophageal reflux disease)    Heart murmur    History of kidney cancer    History of kidney stones    "YRS AGO"  nothing in last 10 yrs   Hypertension    Left bundle branch block    Prostate cancer (HCC)    Skin cancer    Patient Active Problem List   Diagnosis Date Noted   Chest pain, rule out acute myocardial infarction 09/25/2022   Dyslipidemia 09/25/2022   Lumbar pseudoarthrosis 04/13/2021   Malignant neoplasm of prostate (HCC) 06/08/2020   Thigh pain 04/20/2019   Lumbar stenosis with neurogenic claudication 01/05/2019   Body mass index (BMI) 28.0-28.9, adult 12/09/2018   Acute low back pain 11/15/2017   Primary osteoarthritis of right hip 03/26/2017   CKD (chronic kidney disease) stage 3, GFR 30-59 ml/min (HCC) 03/04/2017   Epiretinal membrane 11/08/2011   Nuclear cataract 11/08/2011   Posterior vitreous detachment 11/08/2011   Coronary artery disease    Chest pain    DYSPNEA 02/01/2010   LACTOSE INTOLERANCE 01/31/2010   Essential  hypertension 01/31/2010   LBBB (left bundle branch block) 01/31/2010   Aneurysm of thoracic aorta (HCC) 01/31/2010   G E R D 01/31/2010   Home Medication(s) Prior to Admission medications   Medication Sig Start Date End Date Taking? Authorizing Provider  ondansetron (ZOFRAN-ODT) 4 MG disintegrating tablet Take 1 tablet (4 mg total) by mouth every 8 (eight) hours as needed for up to 3 days for nausea or vomiting. 05/05/23 05/08/23 Yes Jeriko Kowalke, Amadeo Garnet, MD  ALPRAZolam Prudy Feeler) 0.25 MG tablet Take 0.25 mg by mouth at bedtime.    [provider]  amLODipine (NORVASC) 5 MG tablet TAKE 1 TABLET BY MOUTH EVERY DAY 05/24/21   [place], Peter M, MD  docusate sodium (COLACE) 100 MG capsule Take 1 capsule (100 mg total) by mouth 2 (two) times daily. Patient taking differently: Take 100 mg by mouth daily as needed (constipation). 04/14/21   Tressie Stalker, MD  fexofenadine (ALLEGRA) 180 MG tablet Take 180 mg by mouth daily.    [provider]  losartan (COZAAR) 100 MG tablet Take 1 tablet (100 mg total) by mouth daily. 10/16/22 01/14/23  [place], Peter M, MD  Multiple Vitamins-Minerals (CENTRUM SILVER 50+MEN) TABS Take 1 tablet by mouth daily.    [provider]  pantoprazole (PROTONIX) 40 MG tablet Take 1 tablet (40 mg total) by mouth daily. 10/16/22   [place], Peter M, MD  potassium citrate (UROCIT-K) 10 MEQ (1080 MG) SR tablet  Take 20 mEq by mouth 2 (two) times daily.    [provider]  pravastatin (PRAVACHOL) 40 MG tablet Take 40 mg by mouth at bedtime.  02/18/17   [provider]  tamsulosin (FLOMAX) 0.4 MG CAPS capsule Take 0.8 mg by mouth at bedtime.  01/03/17   [provider]                                                                                                                                    Allergies Ultram [tramadol], Neurontin [gabapentin], and Aldara [imiquimod]  Review of Systems Review of Systems  Constitutional:  Negative  for fatigue and fever.  Respiratory:  Negative for shortness of breath.   Gastrointestinal:  Positive for abdominal pain, nausea and vomiting (one when he arrived to the ED). Negative for diarrhea.  Genitourinary:  Negative for dysuria.   As noted in HPI  Physical Exam Vital Signs  I have reviewed the triage vital signs BP 114/88 (BP Location: Right Arm)   Pulse (!) 109   Temp 97.7 F (36.5 C) (Oral)   Resp 18   Ht 5\' 11"  (1.803 m)   Wt 89.4 kg   SpO2 95%   BMI 27.48 kg/m   Physical Exam Vitals reviewed.  Constitutional:      General: He is not in acute distress.    Appearance: He is well-developed. He is not diaphoretic.  HENT:     Head: Normocephalic and atraumatic.     Right Ear: External ear normal.     Left Ear: External ear normal.     Nose: Nose normal.     Mouth/Throat:     Mouth: Mucous membranes are moist.  Eyes:     General: No scleral icterus.    Conjunctiva/sclera: Conjunctivae normal.  Neck:     Trachea: Phonation normal.  Cardiovascular:     Rate and Rhythm: Normal rate and regular rhythm.  Pulmonary:     Effort: Pulmonary effort is normal. No respiratory distress.     Breath sounds: No stridor.  Abdominal:     General: There is no distension.     Tenderness: There is no abdominal tenderness. There is no guarding or rebound.  Musculoskeletal:        General: Normal range of motion.     Cervical back: Normal range of motion.  Neurological:     Mental Status: He is alert and oriented to person, place, and time.  Psychiatric:        Behavior: Behavior normal.     ED Results and Treatments Labs (all labs ordered are listed, but only abnormal results are displayed) Labs Reviewed  COMPREHENSIVE METABOLIC PANEL - Abnormal; Notable for the following components:      Result Value   Glucose, Bld 150 (*)    BUN 28 (*)    Creatinine, Ser 1.80 (*)    AST 13 (*)    GFR,  Estimated 38 (*)    All other components within normal limits  CBC - Abnormal;  Notable for the following components:   Hemoglobin 17.4 (*)    All other components within normal limits  URINALYSIS, ROUTINE W REFLEX MICROSCOPIC - Abnormal; Notable for the following components:   Protein, ur TRACE (*)    All other components within normal limits  RESP PANEL BY RT-PCR (RSV, FLU A&B, COVID)  [id number]  LIPASE, BLOOD  TROPONIN I (HIGH SENSITIVITY)                                                                                                                         EKG  EKG Interpretation Date/Time:    Ventricular Rate:    PR Interval:    QRS Duration:    QT Interval:    QTC Calculation:   R Axis:      Text Interpretation:         Radiology CT Renal Stone Study Result Date: 05/04/2023 CLINICAL DATA:  Abdominal and flank pain EXAM: CT ABDOMEN AND PELVIS WITHOUT CONTRAST TECHNIQUE: Multidetector CT imaging of the abdomen and pelvis was performed following the standard protocol without IV contrast. RADIATION DOSE REDUCTION: This exam was performed according to the departmental dose-optimization program which includes automated exposure control, adjustment of the mA and/or kV according to patient size and/or use of iterative reconstruction technique. COMPARISON:  01/07/2020 FINDINGS: Lower chest: No acute abnormality. Hepatobiliary: No focal liver abnormality is seen. No gallstones, gallbladder wall thickening, or biliary dilatation. Pancreas: Unremarkable. No pancreatic ductal dilatation or surrounding inflammatory changes. Spleen: Normal in size without focal abnormality. Adrenals/Urinary Tract: Adrenal glands are within normal limits. Kidneys demonstrate small bilateral nonobstructing renal calculi. These are stable from the prior exam. The largest of these measures 6 mm on the left. No obstructive changes are seen. Scattered cysts are noted stable from the prior study. No further follow-up is recommended. The bladder is decompressed. Stomach/Bowel: No obstructive or  inflammatory changes of the colon are seen. The appendix is not well visualized consistent with prior surgical history. Some mildly dilated loops of distal small bowel are noted in the right abdomen without definitive transition zone. This may represent a mild small bowel ileus or early small bowel obstruction. Correlate with physical exam. Vascular/Lymphatic: Aortic atherosclerosis. No enlarged abdominal or pelvic lymph nodes. Reproductive: Prostate is unremarkable. Other: No abdominal wall hernia or abnormality. No abdominopelvic ascites. Musculoskeletal: Postsurgical changes are noted in the right hip and lower lumbar spine. IMPRESSION: Mild small-bowel dilatation in the right mid abdomen without definitive transition zone. This may represent a mild ileus or early small bowel obstruction. Correlate with the physical exam. Nonobstructing bilateral renal calculi stable from prior study. No other focal abnormality is noted. Electronically Signed   By: Alcide Clever M.D.   On: 05/04/2023 23:47    Medications Ordered in ED Medications - No data to display Procedures Procedures  (including critical care time) Medical Decision Making / ED Course  Medical Decision Making Amount and/or Complexity of Data Reviewed Labs: ordered. Radiology: ordered.     Clinical Course as of 05/05/23 0052  Sun May 05, 2023  0038 Abdominal pain and distention improved with emesis.  Differential diagnosis considered and workup below.  Workup is most consistent with likely early versus partial small bowel obstruction.  Patient is no longer having abdominal pain at this time.  No other intra-abdominal inflammatory/infectious process. Patient does have stable bilateral renal stones that are nonobstructive.  UA without evidence of infection.  Labs reassuring without leukocytosis or anemia.  No significant electrolyte derangements.  Mild renal insufficiency without overt AKI. [PC]  4098 Patient is no longer having  abdominal pain distention.  Tolerating p.o.  Appears that he has self reduced his obstruction.  Patient was provided with option for admission for observation overnight versus discharge home with liquid diet for the next 48 to 72 hours and strict return precautions.  Shared decision making, patient opted to be discharged home. [PC]    Clinical Course User Index [PC] Saniah Schroeter, Amadeo Garnet, MD    Final Clinical Impression(s) / ED Diagnoses Final diagnoses:  Partial small bowel obstruction [facility])   The patient appears reasonably screened and/or stabilized for discharge and I doubt any other medical condition or other [facility] requiring further screening, evaluation, or treatment in the ED at this time. I have discussed the findings, Dx and Tx plan with the patient/family who expressed understanding and agree(s) with the plan. Discharge instructions discussed at length. The patient/family was given strict return precautions who verbalized understanding of the instructions. No further questions at time of discharge.  Disposition: Discharge  Condition: Good  ED Discharge Orders          Ordered    ondansetron (ZOFRAN-ODT) 4 MG disintegrating tablet  Every 8 hours PRN        05/05/23 0052            Follow Up: Garlan Fillers, MD [address] [place] [address] [phone]  Call  to schedule an appointment for close follow up  Diamantina Monks, MD [address] [place] [address] [facility] [place] [address] [phone]  Call  to schedule an appointment for close follow up    This chart was dictated using voice recognition software.  Despite best efforts to proofread,  errors can occur which can change the documentation meaning.    Nira Conn, MD 05/05/23 [phone]

## 2023-05-04 NOTE — ED Triage Notes (Signed)
 Pt with abd pain since around lunch today. Pt states last BM was this morning and it was normal. Pt states it is his entire abd and it feels distended. During triage process pt vomited . Pt states that he thinks its the chocolate that he ate. Pt states he ate it last night and this morning.Pt endorses feeling much better after vomiting.

## 2023-05-05 MED ORDER — ONDANSETRON 4 MG PO TBDP: 4.0000 mg | ORAL_TABLET | Freq: Three times a day (TID) | ORAL | 0 refills | Status: AC | PRN

## 2023-05-07 DIAGNOSIS — Z981 Arthrodesis status: Secondary | ICD-10-CM | POA: Diagnosis not present

## 2023-05-07 DIAGNOSIS — M48062 Spinal stenosis, lumbar region with neurogenic claudication: Secondary | ICD-10-CM | POA: Diagnosis not present

## 2023-05-22 ENCOUNTER — Encounter (HOSPITAL_COMMUNITY): Payer: Self-pay

## 2023-05-22 ENCOUNTER — Emergency Department (HOSPITAL_COMMUNITY)

## 2023-05-22 ENCOUNTER — Emergency Department (HOSPITAL_COMMUNITY)
Admission: EM | Admit: 2023-05-22 | Discharge: 2023-05-22 | Disposition: A | Discharge status: Home / Self Care | Attending: Emergency Medicine | Admitting: Emergency Medicine

## 2023-05-22 ENCOUNTER — Other Ambulatory Visit: Payer: Self-pay

## 2023-05-22 DIAGNOSIS — R110 Nausea: Secondary | ICD-10-CM | POA: Insufficient documentation

## 2023-05-22 DIAGNOSIS — I2510 Atherosclerotic heart disease of native coronary artery without angina pectoris: Secondary | ICD-10-CM | POA: Insufficient documentation

## 2023-05-22 DIAGNOSIS — I10 Essential (primary) hypertension: Secondary | ICD-10-CM | POA: Insufficient documentation

## 2023-05-22 DIAGNOSIS — R197 Diarrhea, unspecified: Secondary | ICD-10-CM

## 2023-05-22 DIAGNOSIS — R1084 Generalized abdominal pain: Secondary | ICD-10-CM | POA: Diagnosis not present

## 2023-05-22 DIAGNOSIS — Z79899 Other long term (current) drug therapy: Secondary | ICD-10-CM | POA: Insufficient documentation

## 2023-05-22 DIAGNOSIS — R1033 Periumbilical pain: Secondary | ICD-10-CM

## 2023-05-22 DIAGNOSIS — R109 Unspecified abdominal pain: Secondary | ICD-10-CM | POA: Diagnosis not present

## 2023-05-22 DIAGNOSIS — N209 Urinary calculus, unspecified: Secondary | ICD-10-CM | POA: Diagnosis not present

## 2023-05-22 DIAGNOSIS — N400 Enlarged prostate without lower urinary tract symptoms: Secondary | ICD-10-CM | POA: Diagnosis not present

## 2023-05-22 LAB — COMPREHENSIVE METABOLIC PANEL
ALT: 16 U/L (ref 0–44)
AST: 18 U/L (ref 15–41)
Albumin: 3.9 g/dL (ref 3.5–5.0)
Alkaline Phosphatase: 115 U/L (ref 38–126)
Anion gap: 12 (ref 5–15)
BUN: 25 mg/dL — ABNORMAL HIGH (ref 8–23)
CO2: 20 mmol/L — ABNORMAL LOW (ref 22–32)
Calcium: 9.5 mg/dL (ref 8.9–10.3)
Chloride: 107 mmol/L (ref 98–111)
Creatinine, Ser: 1.72 mg/dL — ABNORMAL HIGH (ref 0.61–1.24)
GFR, Estimated: 40 mL/min — ABNORMAL LOW (ref >60)
Glucose, Bld: 176 mg/dL — ABNORMAL HIGH (ref 70–99)
Potassium: 4.1 mmol/L (ref 3.5–5.1)
Sodium: 139 mmol/L (ref 135–145)
Total Bilirubin: 1.2 mg/dL (ref 0.0–1.2)
Total Protein: 6.9 g/dL (ref 6.5–8.1)

## 2023-05-22 LAB — URINALYSIS, ROUTINE W REFLEX MICROSCOPIC
Bacteria, UA: NONE SEEN
Bilirubin Urine: NEGATIVE
Glucose, UA: NEGATIVE mg/dL
Hgb urine dipstick: NEGATIVE
Ketones, ur: NEGATIVE mg/dL
Leukocytes,Ua: NEGATIVE
Nitrite: NEGATIVE
Protein, ur: 30 mg/dL — AB
pH: 5 (ref 5.0–8.0)

## 2023-05-22 LAB — CBC
HCT: 47.8 % (ref 39.0–52.0)
Hemoglobin: 16.9 g/dL (ref 13.0–17.0)
MCH: 31.9 pg (ref 26.0–34.0)
MCHC: 35.4 g/dL (ref 30.0–36.0)
MCV: 90.2 fL (ref 80.0–100.0)
Platelets: 201 10*3/uL (ref 150–400)
RBC: 5.3 MIL/uL (ref 4.22–5.81)
RDW: 11.9 % (ref 11.5–15.5)
WBC: 8.9 10*3/uL (ref 4.0–10.5)
nRBC: 0 % (ref 0.0–0.2)

## 2023-05-22 LAB — LIPASE, BLOOD: Lipase: 28 U/L (ref 11–51)

## 2023-05-22 MED ORDER — ONDANSETRON HCL 4 MG/2ML IJ SOLN
4.0000 mg | Freq: Once | INTRAMUSCULAR | Status: AC
  Administered 2023-05-22: 4 mg via INTRAVENOUS
  Filled 2023-05-22: qty 2

## 2023-05-22 MED ORDER — LACTATED RINGERS IV BOLUS
1000.0000 mL | Freq: Once | INTRAVENOUS | Status: AC
Start: 2023-05-22 — End: 2023-05-22
  Administered 2023-05-22: 1000 mL via INTRAVENOUS

## 2023-05-22 MED ORDER — HYDROMORPHONE HCL 1 MG/ML IJ SOLN
0.5000 mg | Freq: Once | INTRAMUSCULAR | Status: AC
  Administered 2023-05-22: 0.5 mg via INTRAVENOUS
  Filled 2023-05-22: qty 1

## 2023-05-22 MED ORDER — IOHEXOL 300 MG/ML  SOLN
80.0000 mL | Freq: Once | INTRAMUSCULAR | Status: AC | PRN
  Administered 2023-05-22: 80 mL via INTRAVENOUS

## 2023-05-22 NOTE — ED Provider Triage Note (Signed)
 Emergency Medicine Provider Triage Evaluation Note   Corey [name] is a 78 y.o. male with a history of stable 4.3 cm ascending thoracic aortic aneurysm most recently imaged in March 2021, recent ED visit 2 weeks ago for potential ileus versus early bowel obstruction, presenting back to the ED with epigastric and abdominal pain.  Patient reports began having sharp pain and bloating yesterday.  It is mostly around his epigastrium.  He denies nausea or vomiting.  He reports he had a large and runny bowel movement earlier this morning.  He says he was told to come back to the ER if his pain returned after his most recent visit.  I did review those records from February 15, which that the patient had a CT renal stone study, concerning for some mildly dilated loops of bowels in the right mid abdomen without definitive transition point.  He was also noted to have nonobstructive bilateral renal calculi that were stable from prior scan.  Patient reports to me also he has been feeling woozy today.  No report of chest pain or loss of consciousness.  Review of Systems  Positive: Abdominal pain, diarrhea Negative: Vomiting  Physical Exam  BP 109/79 (BP Location: Right Arm)   Pulse (!) 106   Temp 97.6 F (36.4 C) (Oral)   Resp 18   Ht 5\' 11"  (1.803 m)   Wt 90 kg   SpO2 95%   BMI 27.67 kg/m  Gen:   Awake, no distress   Resp:  Normal effort  MSK:   Moves extremities without difficulty  Other:  Abdominal distension, mild to moderate mid-epigastric ttp  Medical Decision Making  Medically screening exam initiated at 11:23 AM.  Appropriate orders placed.  [name] was informed that the remainder of the evaluation will be completed by another provider, this initial triage assessment does not replace that evaluation, and the importance of remaining in the ED until their evaluation is complete.  Abdominal labs ordered Likely needing repeat CT to evaluate for developing obstruction or ileus Will  check hgb and screen for anemia as well with wooziness, ECG screening   Terald Sleeper, MD 05/22/23 1125

## 2023-05-22 NOTE — ED Provider Notes (Signed)
 [facility]MENT AT [facility] Provider Note   CSN: [id number] Arrival date & time: 05/22/23  1024     History  Chief Complaint  Patient presents with   Abdominal Pain    Corey [name] is a 78 y.o. male.  Patient is a 78 year old male with a history of CAD, ascending aortic aneurysm, GERD, left bundle branch block, hypertension and recent visit to the emergency room 2 weeks ago for abdominal pain with nausea and diarrhea with a CT scan that showed ileus versus early partial small bowel obstruction who is returning today with complaints of abdominal pain, nausea and diarrhea.  Patient reports that 2 weeks ago when this occurred his symptoms improved after he had an episode of vomiting and diarrhea.  Since that time he has been taking align and had a full liquid diet for a week and has slowly progress back to his normal diet which is very small meals frequently but reports last night started having pain again in the same area of his abdomen which he reports having to formed stool in the last 24 hours but then the pain was worsening so he came to the emergency room and since being here he has had 15 loose stools but reports his abdominal pain has gone away.  He has not had any vomiting but also reports he has not eaten anything today.  He has not had a fever denies any urinary symptoms.  No URI symptoms but reports he did have the flu a few weeks ago so had not been very active.  The history is provided by the patient, the spouse and medical records.  Abdominal Pain      Home Medications Prior to Admission medications   Medication Sig Start Date End Date Taking? Authorizing Provider  ALPRAZolam (XANAX) 0.25 MG tablet Take 0.25 mg by mouth at bedtime.    [provider]  amLODipine (NORVASC) 5 MG tablet TAKE 1 TABLET BY MOUTH EVERY DAY 05/24/21   [place], Peter M, MD  docusate sodium (COLACE) 100 MG capsule Take 1 capsule (100 mg total) by mouth 2 (two)  times daily. Patient taking differently: Take 100 mg by mouth daily as needed (constipation). 04/14/21   Tressie Stalker, MD  fexofenadine (ALLEGRA) 180 MG tablet Take 180 mg by mouth daily.    [provider]  losartan (COZAAR) 100 MG tablet Take 1 tablet (100 mg total) by mouth daily. 10/16/22 01/14/23  [place], Peter M, MD  Multiple Vitamins-Minerals (CENTRUM SILVER 50+MEN) TABS Take 1 tablet by mouth daily.    [provider]  pantoprazole (PROTONIX) 40 MG tablet Take 1 tablet (40 mg total) by mouth daily. 10/16/22   [place], Peter M, MD  potassium citrate (UROCIT-K) 10 MEQ (1080 MG) SR tablet Take 20 mEq by mouth 2 (two) times daily.    [provider]  pravastatin (PRAVACHOL) 40 MG tablet Take 40 mg by mouth at bedtime.  02/18/17   [provider]  tamsulosin (FLOMAX) 0.4 MG CAPS capsule Take 0.8 mg by mouth at bedtime.  01/03/17   [provider]      Allergies    Ultram [tramadol], Neurontin [gabapentin], and Aldara [imiquimod]    Review of Systems   Review of Systems  Gastrointestinal:  Positive for abdominal pain.    Physical Exam Updated Vital Signs BP 119/81   Pulse 93   Temp (!) 97.5 F (36.4 C) (Oral)   Resp 17   Ht 5\' 11"  (1.803  m)   Wt 90 kg   SpO2 94%   BMI 27.67 kg/m  Physical Exam Vitals and nursing note reviewed.  Constitutional:      General: He is not in acute distress.    Appearance: He is well-developed.  HENT:     Head: Normocephalic and atraumatic.  Eyes:     Conjunctiva/sclera: Conjunctivae normal.     Pupils: Pupils are equal, round, and reactive to light.  Cardiovascular:     Rate and Rhythm: Normal rate and regular rhythm.     Heart sounds: No murmur heard. Pulmonary:     Effort: Pulmonary effort is normal. No respiratory distress.     Breath sounds: Normal breath sounds. No wheezing or rales.  Abdominal:     General: Bowel sounds are normal. There is no distension.     Palpations: Abdomen is soft.      Tenderness: There is no abdominal tenderness. There is no guarding or rebound.  Musculoskeletal:        General: No tenderness. Normal range of motion.     Cervical back: Normal range of motion and neck supple.     Right lower leg: No edema.     Left lower leg: No edema.  Skin:    General: Skin is warm and dry.     Findings: No erythema or rash.  Neurological:     Mental Status: He is alert and oriented to person, place, and time.  Psychiatric:        Behavior: Behavior normal.     ED Results / Procedures / Treatments   Labs (all labs ordered are listed, but only abnormal results are displayed) Labs Reviewed  COMPREHENSIVE METABOLIC PANEL - Abnormal; Notable for the following components:      Result Value   CO2 20 (*)    Glucose, Bld 176 (*)    BUN 25 (*)    Creatinine, Ser 1.72 (*)    GFR, Estimated 40 (*)    All other components within normal limits  URINALYSIS, ROUTINE W REFLEX MICROSCOPIC - Abnormal; Notable for the following components:   Protein, ur 30 (*)    All other components within normal limits  LIPASE, BLOOD  CBC    EKG EKG Interpretation Date/Time:  Wednesday May 22 2023 11:57:38 EST Ventricular Rate:  96 PR Interval:  179 QRS Duration:  168 QT Interval:  388 QTC Calculation: 491 R Axis:   236  Text Interpretation: Sinus or ectopic atrial rhythm Likely arm lead reversal Nonspecific intraventricular conduction delay Chronic LBBB seen on Jul 2024 tracing Confirmed by Alvester Chou [phone]) on 05/22/2023 12:06:01 PM  Radiology CT ABDOMEN PELVIS W CONTRAST Result Date: 05/22/2023 CLINICAL DATA:  Thoracic aortic aneurysm, epigastric and abdominal pain EXAM: CT ABDOMEN AND PELVIS WITH CONTRAST TECHNIQUE: Multidetector CT imaging of the abdomen and pelvis was performed using the standard protocol following bolus administration of intravenous contrast. RADIATION DOSE REDUCTION: This exam was performed according to the departmental dose-optimization program  which includes automated exposure control, adjustment of the mA and/or kV according to patient size and/or use of iterative reconstruction technique. CONTRAST:  80mL OMNIPAQUE IOHEXOL 300 MG/ML  SOLN COMPARISON:  05/04/2023 FINDINGS: Lower chest: No pleural or pericardial effusion. Minimal linear scarring or subsegmental atelectasis at the lung bases Hepatobiliary: No focal liver abnormality is seen. No gallstones, gallbladder wall thickening, or biliary dilatation. Pancreas: Unremarkable. No pancreatic ductal dilatation or surrounding inflammatory changes. Spleen: Normal in size without focal abnormality. Adrenals/Urinary Tract: No adrenal mass.  Bilateral urolithiasis, larger stone on the right 3 cm upper pole, on the left 5 mm upper pole. No hydronephrosis. 2.2 cm 50 HU right upper pole cyst. Additional smaller bilateral cysts. Urinary bladder partially distended, with 6 mm calculus at the level of the right ureteral orifice, new since previous. Stomach/Bowel: Stomach is partially distended, without acute finding. Duodenum and proximal small bowel decompressed. Fluid distended mid and distal small bowel loops. The terminal ileum is nondilated. Post appendectomy. The colon is partially distended by fluid, without acute finding Vascular/Lymphatic: Mild scattered aortoiliac calcified plaque without aneurysm. Portal vein patent. No abdominal or pelvic adenopathy. Reproductive: Prostate enlargement with 3 metallic fiducial markers. Other: No ascites.  No free air. Musculoskeletal: Post instrumented fusion L2-S1., right L2 pedicle screw extending into the L1-2 interspace, with marked degenerative disc disease at this level, stable. Post right hip arthroplasty. No acute findings. IMPRESSION: 1. Fluid distended mid and distal small bowel loops, without definite obstruction. 2. Bilateral urolithiasis, with 6 mm calculus at the level of the right ureteral orifice, new since previous. No hydronephrosis. Electronically Signed    By: Corlis Leak M.D.   On: 05/22/2023 16:11    Procedures Procedures    Medications Ordered in ED Medications  HYDROmorphone (DILAUDID) injection 0.5 mg (0.5 mg Intravenous Given 05/22/23 1152)  ondansetron (ZOFRAN) injection 4 mg (4 mg Intravenous Given 05/22/23 1153)  iohexol (OMNIPAQUE) 300 MG/ML solution 80 mL (80 mLs Intravenous Contrast Given 05/22/23 1252)  lactated ringers bolus 1,000 mL (1,000 mLs Intravenous New Bag/Given 05/22/23 1609)    ED Course/ Medical Decision Making/ A&P                                 Medical Decision Making Amount and/or Complexity of Data Reviewed External Data Reviewed: notes. Labs: ordered. Decision-making details documented in ED Course. Radiology: ordered and independent interpretation performed. Decision-making details documented in ED Course.   Pt with multiple medical problems and comorbidities and presenting today with a complaint that caries a high risk for morbidity and mortality.  Here today with recurrent abdominal pain which has since resolved but persist diarrhea.  No recent antibiotic use or concern for C. difficile.  Concern for recurrent ileus or partial small bowel obstruction.  Patient does report a colonoscopy about 5 or 6 months ago without significant findings.  Low suspicion for AAA rupture or bowel ischemia.  Low suspicion for urinary cause.  Concern for electrolyte abnormality and AKI as well given all the diarrhea.  Patient given IV fluids.  I independently interpreted patient's labs and EKG.  EKG today without acute findings.  Lipase today was within normal limits, CMP with normal LFTs and anion gap.  Electrolytes were normal.  Creatinine is 1.72 and appears to be at his baseline.  CBC within normal limits. I have independently visualized and interpreted pt's images today.  CT without evidence of obstruction or perforation.  Radiology does report fluid distended mid and distal small bowel loops without definite obstruction.  He does  have a 6 mm calculus at the level of the right ureteral orifice but is not having hydronephrosis or pain consistent with kidney stone at this time.  Patient was p.o. challenged and did well and denies any recurrent abdominal pain.  6:59 PM Spoke with Dr. Ewing Schlein and he thinks it would be reasonable for patient to go home and do a clears and bland diet for several days and then  slowly progress back to his normal diet and somebody from the office will call him for follow-up.  He was given return precautions.  Patient is comfortable with this plan.          Final Clinical Impression(s) / ED Diagnoses Final diagnoses:  Generalized abdominal pain  Periumbilical abdominal pain  Diarrhea, unspecified type    Rx / DC Orders ED Discharge Orders     None         Gwyneth Sprout, MD 05/22/23 1859

## 2023-05-22 NOTE — ED Triage Notes (Signed)
 Patient has upper abdominal pain for 2 weeks. Stated he thinks it could be an ulcer. No nausea. Has diarrhea.

## 2023-05-22 NOTE — Discharge Instructions (Addendum)
 No sign of blockage today.  Eat a very bland diet such as broths, applesauce, scrambled eggs for the next few days and slowly progress back to your normal diet.  Somebody from the gastroenterology office should call you for a follow-up.  If in the meantime you develop severe pain, vomiting and are not passing gas or having any stool return to the ER.  Hold the align for right now as well

## 2023-05-30 DIAGNOSIS — M48062 Spinal stenosis, lumbar region with neurogenic claudication: Secondary | ICD-10-CM | POA: Diagnosis not present

## 2023-06-09 ENCOUNTER — Other Ambulatory Visit: Payer: Self-pay

## 2023-06-09 ENCOUNTER — Observation Stay (HOSPITAL_COMMUNITY)

## 2023-06-09 ENCOUNTER — Inpatient Hospital Stay (HOSPITAL_BASED_OUTPATIENT_CLINIC_OR_DEPARTMENT_OTHER)
Admission: EM | Admit: 2023-06-09 | Discharge: 2023-06-11 | DRG: 389 | Disposition: A | Discharge status: Home / Self Care | Attending: Student | Admitting: Student

## 2023-06-09 ENCOUNTER — Emergency Department (HOSPITAL_BASED_OUTPATIENT_CLINIC_OR_DEPARTMENT_OTHER)

## 2023-06-09 ENCOUNTER — Encounter (HOSPITAL_BASED_OUTPATIENT_CLINIC_OR_DEPARTMENT_OTHER): Payer: Self-pay

## 2023-06-09 DIAGNOSIS — N1831 Chronic kidney disease, stage 3a: Secondary | ICD-10-CM | POA: Diagnosis present

## 2023-06-09 DIAGNOSIS — Z800 Family history of malignant neoplasm of digestive organs: Secondary | ICD-10-CM | POA: Diagnosis not present

## 2023-06-09 DIAGNOSIS — Z923 Personal history of irradiation: Secondary | ICD-10-CM | POA: Diagnosis not present

## 2023-06-09 DIAGNOSIS — Z85528 Personal history of other malignant neoplasm of kidney: Secondary | ICD-10-CM | POA: Diagnosis not present

## 2023-06-09 DIAGNOSIS — I7123 Aneurysm of the descending thoracic aorta, without rupture: Secondary | ICD-10-CM | POA: Diagnosis not present

## 2023-06-09 DIAGNOSIS — Z9049 Acquired absence of other specified parts of digestive tract: Secondary | ICD-10-CM | POA: Diagnosis not present

## 2023-06-09 DIAGNOSIS — K56609 Unspecified intestinal obstruction, unspecified as to partial versus complete obstruction: Principal | ICD-10-CM | POA: Diagnosis present

## 2023-06-09 DIAGNOSIS — Z79899 Other long term (current) drug therapy: Secondary | ICD-10-CM

## 2023-06-09 DIAGNOSIS — Z96653 Presence of artificial knee joint, bilateral: Secondary | ICD-10-CM | POA: Diagnosis present

## 2023-06-09 DIAGNOSIS — Z85828 Personal history of other malignant neoplasm of skin: Secondary | ICD-10-CM | POA: Diagnosis not present

## 2023-06-09 DIAGNOSIS — I5032 Chronic diastolic (congestive) heart failure: Secondary | ICD-10-CM | POA: Diagnosis present

## 2023-06-09 DIAGNOSIS — Z8546 Personal history of malignant neoplasm of prostate: Secondary | ICD-10-CM | POA: Diagnosis not present

## 2023-06-09 DIAGNOSIS — K219 Gastro-esophageal reflux disease without esophagitis: Secondary | ICD-10-CM | POA: Diagnosis present

## 2023-06-09 DIAGNOSIS — I130 Hypertensive heart and chronic kidney disease with heart failure and stage 1 through stage 4 chronic kidney disease, or unspecified chronic kidney disease: Secondary | ICD-10-CM | POA: Diagnosis present

## 2023-06-09 DIAGNOSIS — E785 Hyperlipidemia, unspecified: Secondary | ICD-10-CM | POA: Diagnosis present

## 2023-06-09 DIAGNOSIS — N200 Calculus of kidney: Secondary | ICD-10-CM | POA: Diagnosis present

## 2023-06-09 DIAGNOSIS — Z981 Arthrodesis status: Secondary | ICD-10-CM | POA: Diagnosis not present

## 2023-06-09 DIAGNOSIS — I712 Thoracic aortic aneurysm, without rupture, unspecified: Secondary | ICD-10-CM | POA: Diagnosis present

## 2023-06-09 DIAGNOSIS — I2510 Atherosclerotic heart disease of native coronary artery without angina pectoris: Secondary | ICD-10-CM | POA: Diagnosis present

## 2023-06-09 DIAGNOSIS — Z96641 Presence of right artificial hip joint: Secondary | ICD-10-CM | POA: Diagnosis present

## 2023-06-09 DIAGNOSIS — K56699 Other intestinal obstruction unspecified as to partial versus complete obstruction: Secondary | ICD-10-CM | POA: Diagnosis present

## 2023-06-09 DIAGNOSIS — C61 Malignant neoplasm of prostate: Secondary | ICD-10-CM | POA: Diagnosis not present

## 2023-06-09 DIAGNOSIS — I5189 Other ill-defined heart diseases: Secondary | ICD-10-CM

## 2023-06-09 DIAGNOSIS — I7121 Aneurysm of the ascending aorta, without rupture: Secondary | ICD-10-CM | POA: Diagnosis present

## 2023-06-09 DIAGNOSIS — Z905 Acquired absence of kidney: Secondary | ICD-10-CM

## 2023-06-09 DIAGNOSIS — Z0189 Encounter for other specified special examinations: Secondary | ICD-10-CM | POA: Diagnosis not present

## 2023-06-09 DIAGNOSIS — I10 Essential (primary) hypertension: Secondary | ICD-10-CM | POA: Diagnosis not present

## 2023-06-09 DIAGNOSIS — K8689 Other specified diseases of pancreas: Secondary | ICD-10-CM | POA: Diagnosis not present

## 2023-06-09 DIAGNOSIS — K5669 Other partial intestinal obstruction: Secondary | ICD-10-CM | POA: Diagnosis not present

## 2023-06-09 DIAGNOSIS — Z8249 Family history of ischemic heart disease and other diseases of the circulatory system: Secondary | ICD-10-CM

## 2023-06-09 DIAGNOSIS — Z4682 Encounter for fitting and adjustment of non-vascular catheter: Secondary | ICD-10-CM | POA: Diagnosis not present

## 2023-06-09 DIAGNOSIS — Z8042 Family history of malignant neoplasm of prostate: Secondary | ICD-10-CM | POA: Diagnosis not present

## 2023-06-09 LAB — URINALYSIS, ROUTINE W REFLEX MICROSCOPIC
Bilirubin Urine: NEGATIVE
Glucose, UA: NEGATIVE mg/dL
Hgb urine dipstick: NEGATIVE
Leukocytes,Ua: NEGATIVE
Nitrite: NEGATIVE
Protein, ur: NEGATIVE mg/dL
Specific Gravity, Urine: 1.018 (ref 1.005–1.030)
pH: 6 (ref 5.0–8.0)

## 2023-06-09 LAB — CBC WITH DIFFERENTIAL/PLATELET
Abs Immature Granulocytes: 0.02 10*3/uL (ref 0.00–0.07)
Basophils Absolute: 0 10*3/uL (ref 0.0–0.1)
Basophils Relative: 0 %
Eosinophils Absolute: 0.1 10*3/uL (ref 0.0–0.5)
Eosinophils Relative: 1 %
HCT: 42.4 % (ref 39.0–52.0)
Hemoglobin: 15.1 g/dL (ref 13.0–17.0)
Immature Granulocytes: 0 %
Lymphocytes Relative: 12 %
Lymphs Abs: 1.1 10*3/uL (ref 0.7–4.0)
MCH: 32.1 pg (ref 26.0–34.0)
MCHC: 35.6 g/dL (ref 30.0–36.0)
MCV: 90.2 fL (ref 80.0–100.0)
Monocytes Absolute: 0.5 10*3/uL (ref 0.1–1.0)
Monocytes Relative: 6 %
Neutro Abs: 7.1 10*3/uL (ref 1.7–7.7)
Neutrophils Relative %: 81 %
Platelets: 198 10*3/uL (ref 150–400)
RBC: 4.7 MIL/uL (ref 4.22–5.81)
RDW: 12.9 % (ref 11.5–15.5)
WBC: 8.9 10*3/uL (ref 4.0–10.5)
nRBC: 0 % (ref 0.0–0.2)

## 2023-06-09 LAB — COMPREHENSIVE METABOLIC PANEL
ALT: 9 U/L (ref 0–44)
AST: 12 U/L — ABNORMAL LOW (ref 15–41)
Albumin: 4.1 g/dL (ref 3.5–5.0)
Alkaline Phosphatase: 98 U/L (ref 38–126)
Anion gap: 10 (ref 5–15)
BUN: 26 mg/dL — ABNORMAL HIGH (ref 8–23)
CO2: 22 mmol/L (ref 22–32)
Calcium: 9 mg/dL (ref 8.9–10.3)
Chloride: 109 mmol/L (ref 98–111)
Creatinine, Ser: 1.38 mg/dL — ABNORMAL HIGH (ref 0.61–1.24)
GFR, Estimated: 53 mL/min — ABNORMAL LOW (ref >60)
Glucose, Bld: 140 mg/dL — ABNORMAL HIGH (ref 70–99)
Potassium: 3.5 mmol/L (ref 3.5–5.1)
Sodium: 141 mmol/L (ref 135–145)
Total Bilirubin: 1.2 mg/dL (ref 0.0–1.2)
Total Protein: 6.2 g/dL — ABNORMAL LOW (ref 6.5–8.1)

## 2023-06-09 LAB — LIPASE, BLOOD: Lipase: 16 U/L (ref 11–51)

## 2023-06-09 LAB — BASIC METABOLIC PANEL
Anion gap: 8 (ref 5–15)
BUN: 26 mg/dL — ABNORMAL HIGH (ref 8–23)
CO2: 23 mmol/L (ref 22–32)
Calcium: 8.6 mg/dL — ABNORMAL LOW (ref 8.9–10.3)
Chloride: 109 mmol/L (ref 98–111)
Creatinine, Ser: 1.29 mg/dL — ABNORMAL HIGH (ref 0.61–1.24)
GFR, Estimated: 57 mL/min — ABNORMAL LOW (ref >60)
Glucose, Bld: 153 mg/dL — ABNORMAL HIGH (ref 70–99)
Potassium: 4.1 mmol/L (ref 3.5–5.1)
Sodium: 140 mmol/L (ref 135–145)

## 2023-06-09 LAB — MAGNESIUM: Magnesium: 1.7 mg/dL (ref 1.7–2.4)

## 2023-06-09 LAB — PHOSPHORUS: Phosphorus: 3.4 mg/dL (ref 2.5–4.6)

## 2023-06-09 MED ORDER — IOHEXOL 300 MG/ML  SOLN
100.0000 mL | Freq: Once | INTRAMUSCULAR | Status: AC | PRN
  Administered 2023-06-09: 100 mL via INTRAVENOUS

## 2023-06-09 MED ORDER — PANTOPRAZOLE SODIUM 40 MG IV SOLR
40.0000 mg | INTRAVENOUS | Status: DC
  Administered 2023-06-10 – 2023-06-11 (×2): 40 mg via INTRAVENOUS
  Filled 2023-06-09 (×2): qty 10

## 2023-06-09 MED ORDER — MAGNESIUM SULFATE 2 GM/50ML IV SOLN
2.0000 g | Freq: Once | INTRAVENOUS | Status: AC
Start: 2023-06-09 — End: 2023-06-09
  Administered 2023-06-09: 2 g via INTRAVENOUS
  Filled 2023-06-09: qty 50

## 2023-06-09 MED ORDER — ONDANSETRON HCL 4 MG/2ML IJ SOLN
4.0000 mg | Freq: Once | INTRAMUSCULAR | Status: AC
  Administered 2023-06-09: 4 mg via INTRAVENOUS
  Filled 2023-06-09: qty 2

## 2023-06-09 MED ORDER — SODIUM CHLORIDE 0.9 % IV BOLUS
1000.0000 mL | Freq: Once | INTRAVENOUS | Status: AC
  Administered 2023-06-09: 1000 mL via INTRAVENOUS

## 2023-06-09 MED ORDER — ENOXAPARIN SODIUM 40 MG/0.4ML IJ SOSY
40.0000 mg | PREFILLED_SYRINGE | INTRAMUSCULAR | Status: DC
  Administered 2023-06-09 – 2023-06-10 (×2): 40 mg via SUBCUTANEOUS
  Filled 2023-06-09 (×2): qty 0.4

## 2023-06-09 MED ORDER — HYDROMORPHONE HCL 1 MG/ML IJ SOLN
0.5000 mg | INTRAMUSCULAR | Status: DC | PRN
  Administered 2023-06-09 (×2): 0.5 mg via INTRAVENOUS
  Filled 2023-06-09 (×2): qty 0.5

## 2023-06-09 MED ORDER — ALUM & MAG HYDROXIDE-SIMETH 200-200-20 MG/5ML PO SUSP
15.0000 mL | Freq: Once | ORAL | Status: AC
  Administered 2023-06-09: 15 mL via ORAL
  Filled 2023-06-09: qty 30

## 2023-06-09 MED ORDER — HYDROMORPHONE HCL 1 MG/ML IJ SOLN
0.5000 mg | Freq: Once | INTRAMUSCULAR | Status: AC
  Administered 2023-06-09: 0.5 mg via INTRAVENOUS
  Filled 2023-06-09: qty 1

## 2023-06-09 MED ORDER — DIATRIZOATE MEGLUMINE & SODIUM 66-10 % PO SOLN
90.0000 mL | Freq: Once | ORAL | Status: AC
  Administered 2023-06-09: 90 mL via NASOGASTRIC
  Filled 2023-06-09: qty 90

## 2023-06-09 MED ORDER — FAMOTIDINE IN NACL 20-0.9 MG/50ML-% IV SOLN
20.0000 mg | Freq: Once | INTRAVENOUS | Status: AC
  Administered 2023-06-09: 20 mg via INTRAVENOUS
  Filled 2023-06-09: qty 50

## 2023-06-09 MED ORDER — ONDANSETRON HCL 4 MG/2ML IJ SOLN: 4.0000 mg | Freq: Four times a day (QID) | INTRAMUSCULAR | Status: DC | PRN

## 2023-06-09 MED ORDER — PANTOPRAZOLE SODIUM 40 MG IV SOLR
40.0000 mg | Freq: Once | INTRAVENOUS | Status: AC
  Administered 2023-06-09: 40 mg via INTRAVENOUS
  Filled 2023-06-09: qty 10

## 2023-06-09 MED ORDER — SODIUM CHLORIDE 0.9 % IV SOLN: INTRAVENOUS | Status: AC

## 2023-06-09 MED ORDER — ONDANSETRON HCL 4 MG PO TABS: 4.0000 mg | ORAL_TABLET | Freq: Four times a day (QID) | ORAL | Status: DC | PRN

## 2023-06-09 NOTE — Care Management Obs Status (Signed)
 MEDICARE OBSERVATION STATUS NOTIFICATION   Patient Details  Name: ROLLIE HYNEK MRN: [id number] Date of Birth: 05-29-1945   Medicare Observation Status Notification Given:  Yes    Coralyn Helling, LCSW 06/09/2023, 10:04 AM

## 2023-06-09 NOTE — Consult Note (Signed)
 Reason for Consult:small bowel obstruction Referring Physician: Dr. Sanda Klein  Corey [name] is an 78 y.o. male.  HPI: This is a 78 year old gentleman who was admitted with a small bowel obstruction.  He has a past surgical history significant for a perforated appendix requiring a laparotomy when he was approximately 19.  He also has a history of a ascending aortic aneurysm, coronary artery disease, kidney stones, and prostate cancer.  He has had previous radiation to the prostate.  He reports that for about the past 3 weeks he has been having intermittent abdominal pain which has been cramping in nature and moderate to severe.  He has had nausea and vomiting with this.  He presented to the emergency department on 2/15.  He had a CT renal stone study at that time which showed mild small bowel dilation in the right mid abdomen.  He improved and was discharged.  He again then presented to the emergency room on 3/5 as well as diarrhea.  A CT scan at that time showed fluid filled loops of distal small bowel as well as a 6 mm calculus in the right ureteral orifice.  He again improved in the emergency department was discharged home.  Over the past 24 hours he has had nausea and cramping abdominal pain with 1 episode of emesis.  He has not been moving his bowels for the past several days and has not passed flatus.  Another repeat CT scan was performed and showed circumferential bowel wall thickening with a transition in the right lower quadrant and some fecalization of the small bowel.  There was also a small amount of reactive fluid in the pelvis. The patient now has a nasogastric tube in place and is still having some cramping abdominal pain.  He denies fevers or chills.  There has been no dysuria.  He is otherwise without complaints.  Past Medical History:  Diagnosis Date   Arthritis 10/22/2011   hx. osteoarthritis-knees   Ascending aortic aneurysm (HCC)    Coronary artery disease    minimal per cath  in 2011   Dyspnea    GERD (gastroesophageal reflux disease)    Heart murmur    History of kidney cancer    History of kidney stones    "YRS AGO"  nothing in last 10 yrs   Hypertension    Left bundle branch block    Prostate cancer (HCC)    Skin cancer     Past Surgical History:  Procedure Laterality Date   APPENDECTOMY     BACK SURGERY     BELPHAROPTOSIS REPAIR Bilateral    BUNIONECTOMY Right    CARDIAC CATHETERIZATION  12/22/2009   EF60%/minimal nonobstructive atherosclerotic coronary artery disease/normal lt ventricular function/mod aortic root dilatation   CYSTOSCOPY WITH RETROGRADE PYELOGRAM, URETEROSCOPY AND STENT PLACEMENT Left 07/28/2012   Procedure: CYSTOSCOPY WITH RETROGRADE PYELOGRAM, LEFT URETEROSCOPY ;  Surgeon: Crecencio Mc, MD;  Location: [facility];  Service: Urology;  Laterality: Left;   CYSTOSCOPY/RETROGRADE/URETEROSCOPY/STONE EXTRACTION WITH BASKET  10/22/2011   1'12   CYSTOSCOPY/URETEROSCOPY/HOLMIUM LASER/STENT PLACEMENT Right 02/18/2020   Procedure: CYSTOSCOPY/RETROGRADE/URETEROSCOPY/HOLMIUM LASER/STENT PLACEMENT;  Surgeon: Heloise Purpura, MD;  Location: [facility];  Service: Urology;  Laterality: Right;   ELBOW SURGERY Right 1990s?   "tendon repaired"   JOINT REPLACEMENT     both knees & R hip   KNEE ARTHROSCOPY     "? side"   LEFT HEART CATH AND CORONARY ANGIOGRAPHY N/A 09/26/2022   Procedure: LEFT HEART CATH AND CORONARY ANGIOGRAPHY;  Surgeon: Swaziland, Peter M, MD;  Location: [facility] INVASIVE CV LAB;  Service: Cardiovascular;  Laterality: N/A;   LIPOMA EXCISION Left    wrist   LUMBAR SPINE SURGERY  2003   Lumbar fusion -retained hardware([name])   PARTIAL NEPHRECTOMY Right 10/2011   skin cancer removal     TONSILLECTOMY     TOTAL HIP ARTHROPLASTY Right 03/26/2017   Procedure: RIGHT TOTAL HIP ARTHROPLASTY ANTERIOR APPROACH;  Surgeon: Sheral Apley, MD;  Location: [facility];  Service: Orthopedics;  Laterality: Right;   TOTAL KNEE ARTHROPLASTY     bilateral   TRIGGER  FINGER RELEASE Right     Family History  Problem Relation Age of Onset   Prostate cancer Father    Pancreatic cancer Father    Heart attack Brother    Breast cancer Neg Hx    Colon cancer Neg Hx     Social History:  reports that he has never smoked. He has never used smokeless tobacco. He reports that he does not drink alcohol and does not use drugs.  Allergies:  Allergies  Allergen Reactions   Ultram [Tramadol] Shortness Of Breath and Other (See Comments)    Elevated heart rate   Neurontin [Gabapentin] Other (See Comments)    Dizziness    Aldara [Imiquimod] Other (See Comments)    Blisters on application site.    Medications: I have reviewed the patient's current medications.  Results for orders placed or performed during the hospital encounter of 06/09/23 (from the past 48 hours)  Urinalysis, Routine w reflex microscopic -Urine, Clean Catch     Status: Abnormal   Collection Time: 06/09/23  2:41 AM  Result Value Ref Range   Color, Urine YELLOW YELLOW   APPearance CLEAR CLEAR   Specific Gravity, Urine 1.018 1.005 - 1.030   pH 6.0 5.0 - 8.0   Glucose, UA NEGATIVE NEGATIVE mg/dL   Hgb urine dipstick NEGATIVE NEGATIVE   Bilirubin Urine NEGATIVE NEGATIVE   Ketones, ur TRACE (A) NEGATIVE mg/dL   Protein, ur NEGATIVE NEGATIVE mg/dL   Nitrite NEGATIVE NEGATIVE   Leukocytes,Ua NEGATIVE NEGATIVE    Comment: Performed at [facility], [address] [place], [place] [address]  Lipase, blood     Status: None   Collection Time: 06/09/23  2:44 AM  Result Value Ref Range   Lipase 16 11 - 51 U/L    Comment: Performed at [facility], [address], [place], [place] [address]  Comprehensive metabolic panel     Status: Abnormal   Collection Time: 06/09/23  2:44 AM  Result Value Ref Range   Sodium 141 135 - 145 mmol/L   Potassium 3.5 3.5 - 5.1 mmol/L   Chloride 109 98 - 111 mmol/L   CO2 22 22 - 32 mmol/L   Glucose, Bld 140 (H) 70 - 99  mg/dL    Comment: Glucose reference range applies only to samples taken after fasting for at least 8 hours.   BUN 26 (H) 8 - 23 mg/dL   Creatinine, Ser 0.98 (H) 0.61 - 1.24 mg/dL   Calcium 9.0 8.9 - 11.9 mg/dL   Total Protein 6.2 (L) 6.5 - 8.1 g/dL   Albumin 4.1 3.5 - 5.0 g/dL   AST 12 (L) 15 - 41 U/L   ALT 9 0 - 44 U/L   Alkaline Phosphatase 98 38 - 126 U/L   Total Bilirubin 1.2 0.0 - 1.2 mg/dL   GFR, Estimated 53 (L) >60 mL/min    Comment: (NOTE)  Calculated using the CKD-EPI Creatinine Equation (2021)    Anion gap 10 5 - 15    Comment: Performed at [facility], [address] [place], [place] [address]  CBC with Differential     Status: None   Collection Time: 06/09/23  2:44 AM  Result Value Ref Range   WBC 8.9 4.0 - 10.5 K/uL   RBC 4.70 4.22 - 5.81 MIL/uL   Hemoglobin 15.1 13.0 - 17.0 g/dL   HCT 47.8 29.5 - 62.1 %   MCV 90.2 80.0 - 100.0 fL   MCH 32.1 26.0 - 34.0 pg   MCHC 35.6 30.0 - 36.0 g/dL   RDW 30.8 65.7 - 84.6 %   Platelets 198 150 - 400 K/uL   nRBC 0.0 0.0 - 0.2 %   Neutrophils Relative % 81 %   Neutro Abs 7.1 1.7 - 7.7 K/uL   Lymphocytes Relative 12 %   Lymphs Abs 1.1 0.7 - 4.0 K/uL   Monocytes Relative 6 %   Monocytes Absolute 0.5 0.1 - 1.0 K/uL   Eosinophils Relative 1 %   Eosinophils Absolute 0.1 0.0 - 0.5 K/uL   Basophils Relative 0 %   Basophils Absolute 0.0 0.0 - 0.1 K/uL   Immature Granulocytes 0 %   Abs Immature Granulocytes 0.02 0.00 - 0.07 K/uL    Comment: Performed at [facility], [address] [place], [place] [address]    DG Abd Portable 1 View Result Date: 06/09/2023 CLINICAL DATA:  NG tube placement. EXAM: PORTABLE ABDOMEN - 1 VIEW COMPARISON:  01/15/2020 FINDINGS: 2 different images provided. 2nd image obtained at 02602 hours shows the NG tube folded back upon itself in the region of the GE junction. The fold in the catheter is positioned at about the level of the GE junction with the terminal 5-6  cm of the NG tube tracking cranially back up into the distal esophagus. Visualized abdomen demonstrates nonspecific bowel gas pattern. IMPRESSION: On the second of the 2 images, the NG tube is folded back upon itself with the fold positioned in the region of the GE junction and terminal 5-6 cm of the NG tube tracking back up into the esophagus. Electronically Signed   By: Kennith Center M.D.   On: 06/09/2023 06:15   CT ABDOMEN PELVIS W CONTRAST Result Date: 06/09/2023 CLINICAL DATA:  78 year old male with lower abdominal pain for 4-5 hours. EXAM: CT ABDOMEN AND PELVIS WITH CONTRAST TECHNIQUE: Multidetector CT imaging of the abdomen and pelvis was performed using the standard protocol following bolus administration of intravenous contrast. RADIATION DOSE REDUCTION: This exam was performed according to the departmental dose-optimization program which includes automated exposure control, adjustment of the mA and/or kV according to patient size and/or use of iterative reconstruction technique. CONTRAST:  OMNIPAQUE IOHEXOL 300 MG/ML  SOLN COMPARISON:  CT Abdomen and Pelvis 05/22/2023. FINDINGS: Lower chest: Mild bilateral lung base scarring appears stable from earlier this month. Heart size within normal limits. No pericardial or pleural effusion. Hepatobiliary: Stable, negative liver and gallbladder. Pancreas: Stable pancreatic atrophy and dystrophic calcifications. Spleen: Stable and negative. Adrenals/Urinary Tract: Normal adrenal glands. Chronic bilateral renal cortical atrophy and polycystic renal changes (benign-appearing small cysts (no follow-up imaging recommended).). Bilateral nephrolithiasis. No hydronephrosis. No hydroureter. However within the urinary bladder there is 5 mm stone located at or near the left ureterovesical junction now on series 2, image 73. This is similar to the right UVJ region stone seen earlier in the month. The bladder is mostly  decompressed. No convincing distal hydroureter or  ureteral inflammation. Stomach/Bowel: Decompressed large bowel and terminal ileum. Small volume of free fluid in the right lower quadrant including adjacent to the cecum, simple fluid density. Appendix not identified. Stomach and duodenum decompressed. Proximal jejunal loops are normal. Small bowel the comes fluid-filled and dilated in the mid jejunum and continuing distally and the ileum is abnormal. Abnormal loops are dilated up to 42 mm diameter. And there is a small bowel feces sign within the right lower quadrant on series 2, image 63. Abrupt transition point on image 64 where the downstream small bowel appears thickened but still fluid-filled. Indistinct thickening of that loop slowly tapers, and distal loops are decompressed although with a small volume of fluid to the terminal ileum. No free air. Small volume of free fluid only in the right lower quadrant. No twisting of the mesentery. Vascular/Lymphatic: Aortic tortuosity. Aortoiliac calcified atherosclerosis. Mildly ulcerated plaque of the distal thoracic aorta suspected on coronal image 33, stable. No overt abdominal aortic aneurysm. Major arterial structures remain patent. Portal venous system appears patent. No lymphadenopathy. Reproductive: Negative. Other: No pelvis free fluid. Musculoskeletal: Chronic severe lumbar spine degeneration with underlying widespread lumbar posterior and interbody fusion sequelae. Levels of chronic lumbar decompression. Chronic right hip arthroplasty. No acute osseous abnormality identified. IMPRESSION: 1. Positive for Small Bowel Obstruction, with a small bowel feces sign and transition point in the right lower quadrant on series 2, image 64. Circumferential bowel wall thickening at the transition is nonspecific but raises the possibility of infectious/inflammatory etiology. Small volume of reactive appearing free fluid in the right lower quadrant. No free air. 2. Chronic bilateral renal atrophy and nephrolithiasis. 5 mm  stone within the bladder at or near the left UVJ, but no secondary findings of acute obstructive uropathy. 3.  Aortic Atherosclerosis (ICD10-I70.0). Electronically Signed   By: Odessa Fleming M.D.   On: 06/09/2023 05:10    Review of Systems  All other systems reviewed and are negative.  Blood pressure (!) 153/74, pulse 67, temperature 97.7 F (36.5 C), temperature source Oral, resp. rate 16, height 5\' 11"  (1.803 m), weight 84 kg, SpO2 95%. Physical Exam Constitutional:      Appearance: He is well-developed and normal weight. He is not diaphoretic.  HENT:     Head: Normocephalic and atraumatic.  Eyes:     General: No scleral icterus. Cardiovascular:     Rate and Rhythm: Normal rate and regular rhythm.  Pulmonary:     Effort: Pulmonary effort is normal. No respiratory distress.  Abdominal:     Comments: His abdomen is distended mildly.  There is mild tenderness without frank peritonitis.  There is a well-healed lower midline incision without obvious hernia.  There is no hepatomegaly  Skin:    General: Skin is warm and dry.  Neurological:     General: No focal deficit present.     Mental Status: He is alert.  Psychiatric:        Mood and Affect: Mood is not anxious.        Behavior: Behavior normal.     Assessment/Plan: Small bowel obstruction  I have reviewed the patient's CAT scan of the abdomen and pelvis and have discussed the diagnosis with the patient and his wife.  The source of the small bowel obstruction is likely adhesions although we cannot rule out chronic stricturing from his prior radiation versus inflammatory bowel disease which I suspect is less likely.  Currently, I recommend continued nasogastric suctioning to  decompress the bowel and I will order the small bowel protocol to help determine whether or not there is a complete obstruction.  We discussed the potential for improvement with conservative management versus the need for surgical intervention which may be more likely  given the chronicity of this as the patient does report he has had some intermittent slight GI issues over the last 6 months and if it is determined that this is stricturing of the small bowel.  They understand and agree with the plans.  Currently, he does not require emergent surgical intervention today.  Moderately complex medical decision making  Abigail Miyamoto 06/09/2023, 10:07 AM

## 2023-06-09 NOTE — Progress Notes (Signed)
 Hospitalist Transfer Note:    Nursing staff, Please call [facility] Admits & Consults System-Wide number on Amion [phone]) as soon as patient's arrival, so appropriate admitting provider can evaluate the pt.   Transferring facility: [facility] Requesting provider: Dr. Tanda Rockers (EDP at [facility]) Reason for transfer: admission for further evaluation and management of small bowel obstruction.    78 year old male with history of appendectomy, prior partial small bowel obstruction, who presented to [facility] ED complaining of 1 day of abdominal discomfort associated with nausea/vomiting, and abdominal distention.  He has a distant history of appendectomy, as well as a more recent history of partial small bowel obstruction that occurred approximately 1 month ago, and spontaneously resolved with conservative measures.  Imaging notable for CT abdomen/pelvis which showed evidence of small bowel obstruction along with transition point, in the absence of any evidence of bowel perforation.  NGT was placed while at [facility].  EDP has sent message to on-call general surgery requesting consult in the AM.   Subsequently, I accepted this patient for transfer for observation to a med-surg bed at [facility] or [facility] (first available) for further work-up and management of the above.      Corey Pigg, DO Hospitalist

## 2023-06-09 NOTE — ED Triage Notes (Signed)
 Pt presents via POV c/o generalized abd pain. Reports similar episodes recently with scheduled GI apt on 06/18/23.   Reports pain started after eating Spaghetti tonight.

## 2023-06-09 NOTE — ED Notes (Signed)
 He is taken from our department without incident.

## 2023-06-09 NOTE — H&P (Signed)
 History and Physical    Patient: Corey [name] LKG:[id number] DOB: Feb 02, 1946 DOA: 06/09/2023 DOS: the patient was seen and examined on 06/09/2023 PCP: Garlan Fillers, MD  Patient coming from: Home  Chief Complaint:  Chief Complaint  Patient presents with   Abdominal Pain   HPI: Corey [name] is a 78 y.o. male with medical history significant of osteoarthritis, ascending aortic aneurysm, CAD, dyspnea, GERD, heart murmur, kidney cancer, nephrolithiasis, hypertension, hyperlipidemia, left bundle branch block, prostate cancer, skin cancer, history of perforated appendix at age 17 who presented to the emergency department with complaints of abdominal pain associated with nausea and emesis after eating a spaghetti earlier in the evening.  He has been having digestive issues with occasional constipation for months, but had to come to the emergency department on 05/04/2023 and again on 05/22/2023 for similar symptoms, which usually had gotten a lot better with treatment.  However, his symptoms from last night were a lot more intense and severe persistent.  He is feeling better after NGT placement.  No  melena or hematochezia.  No flank pain, dysuria, frequency or hematuria.He denied fever, chills, rhinorrhea, sore throat, wheezing or hemoptysis.  No chest pain, palpitations, diaphoresis, PND, orthopnea or pitting edema of the lower extremities.   No polyuria, polydipsia, polyphagia or blurred vision.   Lab work: Urinalysis shows trace protein, was otherwise unremarkable.  CBC was normal.  Lipase 16 units/L.  CMP showed normal electrolytes, total protein 6.2 g/dL, AST 12 units/L, glucose 140, BUN 26, and creatinine 1.38 mg/dL.  Imaging: CT abdomen/pelvis with contrast showing a small bowel obstruction with small bowel feces sign and transition point in the right lower quadrant.  There is circumferential bowel wall thickening of the transition which is nonspecific, but raises the possibility of  infectious/inflammatory etiology.  Small body no reactive appearing free fluid in the right lower quadrant.  No free air.  Chronic bilateral renal atrophy and nephrolithiasis.  5 mm stone within the bladder and or near the left UVJ but no secondary findings of acute obstructive uropathy.  Aortic atherosclerosis.   ED course: Initial vital signs were temperature 97.6 F, pulse 77, respiration 19, BP 137/78 mmHg O2 sat 97% on room air.  The patient received 1000 mL of normal saline bolus, Protonix 40 mg IVP, ondansetron 4 mg IVP, Maalox 15 mL p.o. x 1, famotidine 20 mg IVP and hydromorphone 0.5 mg IVP x 3.  Review of Systems: As mentioned in the history of present illness. All other systems reviewed and are negative. Past Medical History:  Diagnosis Date   Arthritis 10/22/2011   hx. osteoarthritis-knees   Ascending aortic aneurysm (HCC)    Coronary artery disease    minimal per cath in 2011   Dyspnea    GERD (gastroesophageal reflux disease)    Heart murmur    History of kidney cancer    History of kidney stones    "YRS AGO"  nothing in last 10 yrs   Hypertension    Left bundle branch block    Prostate cancer [facility])    Skin cancer    Past Surgical History:  Procedure Laterality Date   APPENDECTOMY     BACK SURGERY     BELPHAROPTOSIS REPAIR Bilateral    BUNIONECTOMY Right    CARDIAC CATHETERIZATION  12/22/2009   EF60%/minimal nonobstructive atherosclerotic coronary artery disease/normal lt ventricular function/mod aortic root dilatation   CYSTOSCOPY WITH RETROGRADE PYELOGRAM, URETEROSCOPY AND STENT PLACEMENT Left 07/28/2012   Procedure: CYSTOSCOPY WITH RETROGRADE  PYELOGRAM, LEFT URETEROSCOPY ;  Surgeon: Crecencio Mc, MD;  Location: [facility];  Service: Urology;  Laterality: Left;   CYSTOSCOPY/RETROGRADE/URETEROSCOPY/STONE EXTRACTION WITH BASKET  10/22/2011   1'12   CYSTOSCOPY/URETEROSCOPY/HOLMIUM LASER/STENT PLACEMENT Right 02/18/2020   Procedure: CYSTOSCOPY/RETROGRADE/URETEROSCOPY/HOLMIUM  LASER/STENT PLACEMENT;  Surgeon: Heloise Purpura, MD;  Location: [facility];  Service: Urology;  Laterality: Right;   ELBOW SURGERY Right 1990s?   "tendon repaired"   JOINT REPLACEMENT     both knees & R hip   KNEE ARTHROSCOPY     "? side"   LEFT HEART CATH AND CORONARY ANGIOGRAPHY N/A 09/26/2022   Procedure: LEFT HEART CATH AND CORONARY ANGIOGRAPHY;  Surgeon: [place]il[place], Peter M, MD;  [facility] INVASIVE CV LAB;  Service: Cardiovascular;  Laterality: N/A;   LIPOMA EXCISION Left    wrist   LUMBAR SPINE SURGERY  2003   Lumbar fusion -retained hardware([name])   PARTIAL NEPHRECTOMY Right 10/2011   skin cancer removal     TONSILLECTOMY     TOTAL HIP ARTHROPLASTY Right 03/26/2017   Procedure: RIGHT TOTAL HIP ARTHROPLASTY ANTERIOR APPROACH;  Surgeon: Sheral Apley, MD;  Location: [facility] OR;  Service: Orthopedics;  Laterality: Right;   TOTAL KNEE ARTHROPLASTY     bilateral   TRIGGER FINGER RELEASE Right    Social History:  reports that he has never smoked. He has never used smokeless tobacco. He reports that he does not drink alcohol and does not use drugs.  Allergies  Allergen Reactions   Ultram [Tramadol] Shortness Of Breath and Other (See Comments)    Elevated heart rate   Neurontin [Gabapentin] Other (See Comments)    Dizziness    Aldara [Imiquimod] Other (See Comments)    Blisters on application site.    Family History  Problem Relation Age of Onset   Prostate cancer Father    Pancreatic cancer Father    Heart attack Brother    Breast cancer Neg Hx    Colon cancer Neg Hx     Prior to Admission medications   Medication Sig Start Date End Date Taking? Authorizing Provider  ALPRAZolam (XANAX) 0.25 MG tablet Take 0.25 mg by mouth at bedtime.    [provider]  amLODipine (NORVASC) 5 MG tablet TAKE 1 TABLET BY MOUTH EVERY DAY 05/24/21   [place], Peter M, MD  docusate sodium (COLACE) 100 MG capsule Take 1 capsule (100 mg total) by mouth 2 (two) times daily. Patient taking  differently: Take 100 mg by mouth daily as needed (constipation). 04/14/21   Tressie Stalker, MD  fexofenadine (ALLEGRA) 180 MG tablet Take 180 mg by mouth daily.    [provider]  losartan (COZAAR) 100 MG tablet Take 1 tablet (100 mg total) by mouth daily. 10/16/22 01/14/23  [place], Peter M, MD  Multiple Vitamins-Minerals (CENTRUM SILVER 50+MEN) TABS Take 1 tablet by mouth daily.    [provider]  pantoprazole (PROTONIX) 40 MG tablet Take 1 tablet (40 mg total) by mouth daily. 10/16/22   [place], Peter M, MD  potassium citrate (UROCIT-K) 10 MEQ (1080 MG) SR tablet Take 20 mEq by mouth 2 (two) times daily.    [provider]  pravastatin (PRAVACHOL) 40 MG tablet Take 40 mg by mouth at bedtime.  02/18/17   [provider]  tamsulosin (FLOMAX) 0.4 MG CAPS capsule Take 0.8 mg by mouth at bedtime.  01/03/17   [provider]    Physical Exam: Vitals:   06/09/23 0615 06/09/23 0630 06/09/23 0730 06/09/23 0745  BP: Marland Kitchen)  142/83 (!) 142/83 128/76 (!) 151/74  Pulse: 85 80 81 82  Resp: 15 (!) 23 18 15   Temp:      TempSrc:      SpO2: 93% 97% 94% 96%   Physical Exam Vitals and nursing note reviewed.  Constitutional:      Appearance: He is well-developed. He is ill-appearing.  HENT:     Head: Normocephalic.     Nose: No rhinorrhea.     Mouth/Throat:     Mouth: Mucous membranes are dry.  Eyes:     General: No scleral icterus.    Pupils: Pupils are equal, round, and reactive to light.  Cardiovascular:     Rate and Rhythm: Normal rate and regular rhythm.  Pulmonary:     Effort: Pulmonary effort is normal.     Breath sounds: No wheezing, rhonchi or rales.  Abdominal:     General: Bowel sounds are decreased. There is distension.     Palpations: Abdomen is soft.     Tenderness: There is abdominal tenderness. There is no right CVA tenderness, left CVA tenderness, guarding or rebound.  Musculoskeletal:     Cervical back: Neck supple.     Right lower  leg: No edema.     Left lower leg: No edema.  Skin:    General: Skin is warm and dry.  Neurological:     General: No focal deficit present.     Mental Status: He is alert and oriented to person, place, and time.  Psychiatric:        Mood and Affect: Mood normal.        Behavior: Behavior normal.     Data Reviewed:  Results are pending, will review when available. 09/25/2022 transthoracic echocardiogram. IMPRESSIONS:   1. Left ventricular ejection fraction, by estimation, is 50 to 55%. The  left ventricle has low normal function. The left ventricle has no regional  wall motion abnormalities. Left ventricular diastolic parameters are  consistent with Grade I diastolic  dysfunction (impaired relaxation). The average left ventricular global  longitudinal strain is -20.1 %. The global longitudinal strain is normal.   2. Right ventricular systolic function is normal. The right ventricular  size is normal. There is normal pulmonary artery systolic pressure. The  estimated right ventricular systolic pressure is 22.9 mmHg.   3. The mitral valve is normal in structure. Mild mitral valve  regurgitation. No evidence of mitral stenosis.   4. The aortic valve is tricuspid. Aortic valve regurgitation is trivial.  No aortic stenosis is present.   5. The inferior vena cava is normal in size with greater than 50%  respiratory variability, suggesting right atrial pressure of 3 mmHg.   EKG: Vent. rate 74 BPM PR interval 207 ms QRS duration 170 ms QT/QTcB 433/481 ms P-R-T axes 55 -32 131 Sinus rhythm Left bundle branch block  Assessment and Plan: Principal Problem:   SBO (small bowel obstruction) (HCC) Inpatient/MedSurg. Keep NPO. Continue NTG at LIS. Continue IV fluids. Analgesics as needed. Antiemetics as needed. Pantoprazole 40 mg IVP every 24 hours. Keep electrolytes optimized. Follow-up CBC and CMP in AM. Follow-up imaging in the morning. General surgery input  appreciated.  Active Problems:   Essential hypertension On amlodipine 5 mg p.o. daily. On losartan 100 mg p.o. daily. Holding at the moment while NPO.    Grade I diastolic dysfunction On losartan as above. No signs of volume overload.    Coronary artery disease No angina or palpitations. Continue CCB and ARB once back  on diet.    Aneurysm of thoracic aorta (HCC) Follow-up with cardiovascular surgery as OP.    G E R D Continue pantoprazole 40 mg IVP daily. Switch to oral formulation once cleared for oral intake.    Stage 3a chronic kidney disease (CKD) (HCC) Monitor renal function electrolytes.    Malignant neoplasm of prostate [facility]) Follow-up with urology as scheduled.    Dyslipidemia Resume statin once cleared for oral intake.    History of lumbar fusion No significant back pain at the moment.     Advance Care Planning:   Code Status: Full Code   Consults: [place] surgery Abigail Miyamoto, MD).  Family Communication: His wife was at bedside.  Severity of Illness: The appropriate patient status for this patient is OBSERVATION. Observation status is judged to be reasonable and necessary in order to provide the required intensity of service to ensure the patient's safety. The patient's presenting symptoms, physical exam findings, and initial radiographic and laboratory data in the context of their medical condition is felt to place them at decreased risk for further clinical deterioration. Furthermore, it is anticipated that the patient will be medically stable for discharge from the hospital within 2 midnights of admission.   Author: Bobette Mo, MD 06/09/2023 8:47 AM  For on call review [email or web address].   This document was prepared using Dragon voice recognition software and may contain some unintended transcription errors.

## 2023-06-09 NOTE — ED Notes (Signed)
 Report called over to [facility] and given to [place]

## 2023-06-09 NOTE — ED Provider Notes (Signed)
 [facility] EMERGENCY DEPARTMENT AT [facility] Provider Note  CSN: [id number] Arrival date & time: 06/09/23 0224  Chief Complaint(s) Abdominal Pain  HPI Corey [name] is a 78 y.o. male with past medical history as below, significant for ascending aortic aneurysm, CAD, GERD, nephrolithiasis, hypertension who presents to the ED with complaint of Abdominal pain  Patient reports that he began having abdominal pain after eating spaghetti last night.  Lower abdomen, sharp stabbing sensation.  Did vomit once.  Nonbloody nonbilious.  No melena or BRBPR.  No change with urination.  No fevers or chills, no recent travel or sick contacts.  Follows with gastroenterology Dr. Ewing Schlein.  Patient reports that he is pending EGD in the near future.  Patient reports that around 2 weeks ago she had similar complaints and he was seen in the ER.  He was discharged in stable condition.  He had another visit around a month ago with similar complaints and is not having a partial small bowel obstruction which resolved while in the ER and was discharged home. Past Medical History Past Medical History:  Diagnosis Date   Arthritis 10/22/2011   hx. osteoarthritis-knees   Ascending aortic aneurysm (HCC)    Coronary artery disease    minimal per cath in 2011   Dyspnea    GERD (gastroesophageal reflux disease)    Heart murmur    History of kidney cancer    History of kidney stones    "YRS AGO"  nothing in last 10 yrs   Hypertension    Left bundle branch block    Prostate cancer (HCC)    Skin cancer    Patient Active Problem List   Diagnosis Date Noted   Chest pain, rule out acute myocardial infarction 09/25/2022   Dyslipidemia 09/25/2022   Lumbar pseudoarthrosis 04/13/2021   Malignant neoplasm of prostate (HCC) 06/08/2020   Thigh pain 04/20/2019   Lumbar stenosis with neurogenic claudication 01/05/2019   Body mass index (BMI) 28.0-28.9, adult 12/09/2018   Acute low back pain 11/15/2017   Primary  osteoarthritis of right hip 03/26/2017   CKD (chronic kidney disease) stage 3, GFR 30-59 ml/min (HCC) 03/04/2017   Epiretinal membrane 11/08/2011   Nuclear cataract 11/08/2011   Posterior vitreous detachment 11/08/2011   Coronary artery disease    Chest pain    DYSPNEA 02/01/2010   LACTOSE INTOLERANCE 01/31/2010   Essential hypertension 01/31/2010   LBBB (left bundle branch block) 01/31/2010   Aneurysm of thoracic aorta (HCC) 01/31/2010   G E R D 01/31/2010   Home Medication(s) Prior to Admission medications   Medication Sig Start Date End Date Taking? Authorizing Provider  ALPRAZolam (XANAX) 0.25 MG tablet Take 0.25 mg by mouth at bedtime.    [provider]  amLODipine (NORVASC) 5 MG tablet TAKE 1 TABLET BY MOUTH EVERY DAY 05/24/21   [place], Peter M, MD  docusate sodium (COLACE) 100 MG capsule Take 1 capsule (100 mg total) by mouth 2 (two) times daily. Patient taking differently: Take 100 mg by mouth daily as needed (constipation). 04/14/21   Tressie Stalker, MD  fexofenadine (ALLEGRA) 180 MG tablet Take 180 mg by mouth daily.    [provider]  losartan (COZAAR) 100 MG tablet Take 1 tablet (100 mg total) by mouth daily. 10/16/22 01/14/23  [place], Peter M, MD  Multiple Vitamins-Minerals (CENTRUM SILVER 50+MEN) TABS Take 1 tablet by mouth daily.    [provider]  pantoprazole (PROTONIX) 40 MG tablet Take 1 tablet (40 mg total) by  mouth daily. 10/16/22   [place], Peter M, MD  potassium citrate (UROCIT-K) 10 MEQ (1080 MG) SR tablet Take 20 mEq by mouth 2 (two) times daily.    [provider]  pravastatin (PRAVACHOL) 40 MG tablet Take 40 mg by mouth at bedtime.  02/18/17   [provider]  tamsulosin (FLOMAX) 0.4 MG CAPS capsule Take 0.8 mg by mouth at bedtime.  01/03/17   [provider]                                                                                                                                    Past Surgical  History Past Surgical History:  Procedure Laterality Date   APPENDECTOMY     BACK SURGERY     BELPHAROPTOSIS REPAIR Bilateral    BUNIONECTOMY Right    CARDIAC CATHETERIZATION  12/22/2009   EF60%/minimal nonobstructive atherosclerotic coronary artery disease/normal lt ventricular function/mod aortic root dilatation   CYSTOSCOPY WITH RETROGRADE PYELOGRAM, URETEROSCOPY AND STENT PLACEMENT Left 07/28/2012   Procedure: CYSTOSCOPY WITH RETROGRADE PYELOGRAM, LEFT URETEROSCOPY ;  Surgeon: Crecencio Mc, MD;  Location: [facility];  Service: Urology;  Laterality: Left;   CYSTOSCOPY/RETROGRADE/URETEROSCOPY/STONE EXTRACTION WITH BASKET  10/22/2011   1'12   CYSTOSCOPY/URETEROSCOPY/HOLMIUM LASER/STENT PLACEMENT Right 02/18/2020   Procedure: CYSTOSCOPY/RETROGRADE/URETEROSCOPY/HOLMIUM LASER/STENT PLACEMENT;  Surgeon: Heloise Purpura, MD;  Location: [facility];  Service: Urology;  Laterality: Right;   ELBOW SURGERY Right 1990s?   "tendon repaired"   JOINT REPLACEMENT     both knees & R hip   KNEE ARTHROSCOPY     "? side"   LEFT HEART CATH AND CORONARY ANGIOGRAPHY N/A 09/26/2022   Procedure: LEFT HEART CATH AND CORONARY ANGIOGRAPHY;  Surgeon: [place], Peter M, MD;  Location: [facility] INVASIVE CV LAB;  Service: Cardiovascular;  Laterality: N/A;   LIPOMA EXCISION Left    wrist   LUMBAR SPINE SURGERY  2003   Lumbar fusion -retained hardware([name])   PARTIAL NEPHRECTOMY Right 10/2011   skin cancer removal     TONSILLECTOMY     TOTAL HIP ARTHROPLASTY Right 03/26/2017   Procedure: RIGHT TOTAL HIP ARTHROPLASTY ANTERIOR APPROACH;  Surgeon: Sheral Apley, MD;  Location: [facility] OR;  Service: Orthopedics;  Laterality: Right;   TOTAL KNEE ARTHROPLASTY     bilateral   TRIGGER FINGER RELEASE Right    Family History Family History  Problem Relation Age of Onset   Prostate cancer Father    Pancreatic cancer Father    Heart attack Brother    Breast cancer Neg Hx    Colon cancer Neg Hx     Social History Social History    Tobacco Use   Smoking status: Never   Smokeless tobacco: Never  Vaping Use   Vaping status: Never Used  Substance Use Topics   Alcohol use: No   Drug use: No   Allergies Ultram [tramadol], Neurontin [gabapentin], and Aldara [imiquimod]  Review of Systems A thorough review  of systems was obtained and all systems are negative except as noted in the HPI and PMH.   Physical Exam Vital Signs  I have reviewed the triage vital signs BP (!) 151/84   Pulse 78   Temp 97.6 F (36.4 C) (Oral)   Resp (!) 9   SpO2 92%  Physical Exam Vitals and nursing note reviewed.  Constitutional:      General: He is not in acute distress.    Appearance: He is well-developed.  HENT:     Head: Normocephalic and atraumatic.     Right Ear: External ear normal.     Left Ear: External ear normal.     Mouth/Throat:     Mouth: Mucous membranes are moist.  Eyes:     General: No scleral icterus. Cardiovascular:     Rate and Rhythm: Normal rate and regular rhythm.     Pulses: Normal pulses.     Heart sounds: Normal heart sounds.  Pulmonary:     Effort: Pulmonary effort is normal. No respiratory distress.     Breath sounds: Normal breath sounds.  Abdominal:     General: Abdomen is flat.     Palpations: Abdomen is soft.     Tenderness: There is abdominal tenderness in the right lower quadrant, periumbilical area and suprapubic area. There is no guarding or rebound. Negative signs include Murphy's sign.  Musculoskeletal:     Cervical back: No rigidity.     Right lower leg: No edema.     Left lower leg: No edema.  Skin:    General: Skin is warm and dry.     Capillary Refill: Capillary refill takes less than 2 seconds.  Neurological:     Mental Status: He is alert.  Psychiatric:        Mood and Affect: Mood normal.        Behavior: Behavior normal.     ED Results and Treatments Labs (all labs ordered are listed, but only abnormal results are displayed) Labs Reviewed  COMPREHENSIVE  METABOLIC PANEL - Abnormal; Notable for the following components:      Result Value   Glucose, Bld 140 (*)    BUN 26 (*)    Creatinine, Ser 1.38 (*)    Total Protein 6.2 (*)    AST 12 (*)    GFR, Estimated 53 (*)    All other components within normal limits  URINALYSIS, ROUTINE W REFLEX MICROSCOPIC - Abnormal; Notable for the following components:   Ketones, ur TRACE (*)    All other components within normal limits  LIPASE, BLOOD  CBC WITH DIFFERENTIAL/PLATELET                                                                                                                          Radiology CT ABDOMEN PELVIS W CONTRAST Result Date: 06/09/2023 CLINICAL DATA:  78 year old male with lower abdominal pain for 4-5 hours. EXAM: CT ABDOMEN AND PELVIS WITH CONTRAST TECHNIQUE: Multidetector CT imaging  of the abdomen and pelvis was performed using the standard protocol following bolus administration of intravenous contrast. RADIATION DOSE REDUCTION: This exam was performed according to the departmental dose-optimization program which includes automated exposure control, adjustment of the mA and/or kV according to patient size and/or use of iterative reconstruction technique. CONTRAST:  OMNIPAQUE IOHEXOL 300 MG/ML  SOLN COMPARISON:  CT Abdomen and Pelvis 05/22/2023. FINDINGS: Lower chest: Mild bilateral lung base scarring appears stable from earlier this month. Heart size within normal limits. No pericardial or pleural effusion. Hepatobiliary: Stable, negative liver and gallbladder. Pancreas: Stable pancreatic atrophy and dystrophic calcifications. Spleen: Stable and negative. Adrenals/Urinary Tract: Normal adrenal glands. Chronic bilateral renal cortical atrophy and polycystic renal changes (benign-appearing small cysts (no follow-up imaging recommended).). Bilateral nephrolithiasis. No hydronephrosis. No hydroureter. However within the urinary bladder there is 5 mm stone located at or near the left  ureterovesical junction now on series 2, image 73. This is similar to the right UVJ region stone seen earlier in the month. The bladder is mostly decompressed. No convincing distal hydroureter or ureteral inflammation. Stomach/Bowel: Decompressed large bowel and terminal ileum. Small volume of free fluid in the right lower quadrant including adjacent to the cecum, simple fluid density. Appendix not identified. Stomach and duodenum decompressed. Proximal jejunal loops are normal. Small bowel the comes fluid-filled and dilated in the mid jejunum and continuing distally and the ileum is abnormal. Abnormal loops are dilated up to 42 mm diameter. And there is a small bowel feces sign within the right lower quadrant on series 2, image 63. Abrupt transition point on image 64 where the downstream small bowel appears thickened but still fluid-filled. Indistinct thickening of that loop slowly tapers, and distal loops are decompressed although with a small volume of fluid to the terminal ileum. No free air. Small volume of free fluid only in the right lower quadrant. No twisting of the mesentery. Vascular/Lymphatic: Aortic tortuosity. Aortoiliac calcified atherosclerosis. Mildly ulcerated plaque of the distal thoracic aorta suspected on coronal image 33, stable. No overt abdominal aortic aneurysm. Major arterial structures remain patent. Portal venous system appears patent. No lymphadenopathy. Reproductive: Negative. Other: No pelvis free fluid. Musculoskeletal: Chronic severe lumbar spine degeneration with underlying widespread lumbar posterior and interbody fusion sequelae. Levels of chronic lumbar decompression. Chronic right hip arthroplasty. No acute osseous abnormality identified. IMPRESSION: 1. Positive for Small Bowel Obstruction, with a small bowel feces sign and transition point in the right lower quadrant on series 2, image 64. Circumferential bowel wall thickening at the transition is nonspecific but raises the  possibility of infectious/inflammatory etiology. Small volume of reactive appearing free fluid in the right lower quadrant. No free air. 2. Chronic bilateral renal atrophy and nephrolithiasis. 5 mm stone within the bladder at or near the left UVJ, but no secondary findings of acute obstructive uropathy. 3.  Aortic Atherosclerosis (ICD10-I70.0). Electronically Signed   By: Odessa Fleming M.D.   On: 06/09/2023 05:10    Pertinent labs & imaging results that were available during my care of the patient were reviewed by me and considered in my medical decision making (see MDM for details).  Medications Ordered in ED Medications  ondansetron (ZOFRAN) injection 4 mg (4 mg Intravenous Given 06/09/23 0255)  famotidine (PEPCID) IVPB 20 mg premix (0 mg Intravenous Stopped 06/09/23 0330)  sodium chloride 0.9 % bolus 1,000 mL (0 mLs Intravenous Stopped 06/09/23 0330)  HYDROmorphone (DILAUDID) injection 0.5 mg (0.5 mg Intravenous Given 06/09/23 0255)  iohexol (OMNIPAQUE) 300 MG/ML solution 100  mL (100 mLs Intravenous Contrast Given 06/09/23 0351)  pantoprazole (PROTONIX) injection 40 mg (40 mg Intravenous Given 06/09/23 0428)  HYDROmorphone (DILAUDID) injection 0.5 mg (0.5 mg Intravenous Given 06/09/23 0427)  alum & mag hydroxide-simeth (MAALOX/MYLANTA) 200-200-20 MG/5ML suspension 15 mL (15 mLs Oral Given 06/09/23 0428)                                                                                                                                     Procedures Procedures  (including critical care time)  Medical Decision Making / ED Course    Medical Decision Making:    Corey [name] is a 78 y.o. male  with past medical history as below, significant for ascending aortic aneurysm, CAD, GERD, nephrolithiasis, hypertension who presents to the ED with complaint of Abdominal pain. The complaint involves an extensive differential diagnosis and also carries with it a high risk of complications and morbidity.  Serious  etiology was considered. Ddx includes but is not limited to: Differential diagnosis includes but is not exclusive to acute appendicitis, renal colic, testicular torsion, urinary tract infection, prostatitis,  diverticulitis, small bowel obstruction, colitis, abdominal aortic aneurysm, gastroenteritis, constipation etc.   Complete initial physical exam performed, notably the patient was in no distress, HDS, abdomen nonperitoneal.    Reviewed and confirmed nursing documentation for past medical history, family history, social history.  Vital signs reviewed.     Clinical Course as of 06/09/23 0543  Sun Jun 09, 2023  0340 Symptoms improved [SG]  0341 Creatinine(!): 1.38 Improved from prior  [SG]  0516 CT revealing for SBO, he has history of prior partial small bowel obstruction.  Prior surgical history with appendectomy.  He is still having ongoing abd pain, nausea no vomiting.  His abdomen is distended.  Will attempt NG tube.  Plan admission to medical service with general surgery on consult [SG]    Clinical Course User Index [SG] Sloan Leiter, DO    Brief summary: 78 year old male with history as above here with lower abdominal pain after eating spaghetti.  No evidence of bleeding at this time.  No further vomiting.  HDS.  Will check screening labs, get CT imaging abdomen pelvis.  Labs reviewed, these are stable.  Patient found to have small bowel obstruction, will place NG tube given ongoing pain, distention and some nausea.  General surgery was notified.  Will admit to hospitalist.  Patient and spouse at bedside are agreeable.   Echo 7/24 with LVEF 50-55%, G1 DD LHC 7/24 Dr. [place] with nonobstructive CAD         Additional history obtained: -Additional history obtained from spouse -External records from outside source obtained and reviewed including: Chart review including previous notes, labs, imaging, consultation notes including  Prior LHC and echo, prior ED visits, prior  labs and imaging   Lab Tests: -I ordered, reviewed, and interpreted labs.   The pertinent results include:  Labs Reviewed  COMPREHENSIVE METABOLIC PANEL - Abnormal; Notable for the following components:      Result Value   Glucose, Bld 140 (*)    BUN 26 (*)    Creatinine, Ser 1.38 (*)    Total Protein 6.2 (*)    AST 12 (*)    GFR, Estimated 53 (*)    All other components within normal limits  URINALYSIS, ROUTINE W REFLEX MICROSCOPIC - Abnormal; Notable for the following components:   Ketones, ur TRACE (*)    All other components within normal limits  LIPASE, BLOOD  CBC WITH DIFFERENTIAL/PLATELET    Notable for labs stable  EKG   EKG Interpretation Date/Time:  Sunday June 09 2023 02:49:15 EDT Ventricular Rate:  74 PR Interval:  207 QRS Duration:  170 QT Interval:  433 QTC Calculation: 481 R Axis:   -32  Text Interpretation: Sinus rhythm Left bundle branch block Confirmed by Tanda Rockers ([id number]) on 06/09/2023 3:14:47 AM         Imaging Studies ordered: I ordered imaging studies including CTAP I independently visualized the following imaging with scope of interpretation limited to determining acute life threatening conditions related to emergency care; findings noted above I independently visualized and interpreted imaging. I agree with the radiologist interpretation   Medicines ordered and prescription drug management: Meds ordered this encounter  Medications   ondansetron (ZOFRAN) injection 4 mg   famotidine (PEPCID) IVPB 20 mg premix   sodium chloride 0.9 % bolus 1,000 mL   HYDROmorphone (DILAUDID) injection 0.5 mg   iohexol (OMNIPAQUE) 300 MG/ML solution 100 mL   pantoprazole (PROTONIX) injection 40 mg   HYDROmorphone (DILAUDID) injection 0.5 mg   alum & mag hydroxide-simeth (MAALOX/MYLANTA) 200-200-20 MG/5ML suspension 15 mL    -I have reviewed the patients home medicines and have made adjustments as needed   Consultations Obtained: I requested  consultation with the gen surg,  and discussed lab and imaging findings as well as pertinent plan   Cardiac Monitoring: Continuous pulse oximetry interpreted by myself, 98% on RA.    Social Determinants of Health:  Diagnosis or treatment significantly limited by social determinants of health: na   Reevaluation: After the interventions noted above, I reevaluated the patient and found that they have improved  Co morbidities that complicate the patient evaluation  Past Medical History:  Diagnosis Date   Arthritis 10/22/2011   hx. osteoarthritis-knees   Ascending aortic aneurysm (HCC)    Coronary artery disease    minimal per cath in 2011   Dyspnea    GERD (gastroesophageal reflux disease)    Heart murmur    History of kidney cancer    History of kidney stones    "YRS AGO"  nothing in last 10 yrs   Hypertension    Left bundle branch block    Prostate cancer (HCC)    Skin cancer       Dispostion: Disposition decision including need for hospitalization was considered, and patient admitted to the hospital.    Final Clinical Impression(s) / ED Diagnoses Final diagnoses:  SBO (small bowel obstruction) (HCC)        Sloan Leiter, DO 06/09/23 0543

## 2023-06-10 ENCOUNTER — Observation Stay (HOSPITAL_COMMUNITY)

## 2023-06-10 DIAGNOSIS — K56609 Unspecified intestinal obstruction, unspecified as to partial versus complete obstruction: Secondary | ICD-10-CM | POA: Diagnosis present

## 2023-06-10 DIAGNOSIS — I2510 Atherosclerotic heart disease of native coronary artery without angina pectoris: Secondary | ICD-10-CM | POA: Diagnosis present

## 2023-06-10 DIAGNOSIS — Z85528 Personal history of other malignant neoplasm of kidney: Secondary | ICD-10-CM | POA: Diagnosis not present

## 2023-06-10 DIAGNOSIS — Z9049 Acquired absence of other specified parts of digestive tract: Secondary | ICD-10-CM | POA: Diagnosis not present

## 2023-06-10 DIAGNOSIS — Z981 Arthrodesis status: Secondary | ICD-10-CM

## 2023-06-10 DIAGNOSIS — C61 Malignant neoplasm of prostate: Secondary | ICD-10-CM | POA: Diagnosis not present

## 2023-06-10 DIAGNOSIS — I5189 Other ill-defined heart diseases: Secondary | ICD-10-CM | POA: Diagnosis not present

## 2023-06-10 DIAGNOSIS — N200 Calculus of kidney: Secondary | ICD-10-CM | POA: Diagnosis present

## 2023-06-10 DIAGNOSIS — Z905 Acquired absence of kidney: Secondary | ICD-10-CM | POA: Diagnosis not present

## 2023-06-10 DIAGNOSIS — E785 Hyperlipidemia, unspecified: Secondary | ICD-10-CM | POA: Diagnosis present

## 2023-06-10 DIAGNOSIS — I10 Essential (primary) hypertension: Secondary | ICD-10-CM | POA: Diagnosis not present

## 2023-06-10 DIAGNOSIS — Z923 Personal history of irradiation: Secondary | ICD-10-CM | POA: Diagnosis not present

## 2023-06-10 DIAGNOSIS — Z8249 Family history of ischemic heart disease and other diseases of the circulatory system: Secondary | ICD-10-CM | POA: Diagnosis not present

## 2023-06-10 DIAGNOSIS — K56699 Other intestinal obstruction unspecified as to partial versus complete obstruction: Secondary | ICD-10-CM | POA: Diagnosis present

## 2023-06-10 DIAGNOSIS — K219 Gastro-esophageal reflux disease without esophagitis: Secondary | ICD-10-CM | POA: Diagnosis present

## 2023-06-10 DIAGNOSIS — I5032 Chronic diastolic (congestive) heart failure: Secondary | ICD-10-CM | POA: Diagnosis present

## 2023-06-10 DIAGNOSIS — I7121 Aneurysm of the ascending aorta, without rupture: Secondary | ICD-10-CM | POA: Diagnosis present

## 2023-06-10 DIAGNOSIS — K5669 Other partial intestinal obstruction: Secondary | ICD-10-CM | POA: Diagnosis not present

## 2023-06-10 DIAGNOSIS — I7123 Aneurysm of the descending thoracic aorta, without rupture: Secondary | ICD-10-CM | POA: Diagnosis not present

## 2023-06-10 DIAGNOSIS — N1831 Chronic kidney disease, stage 3a: Secondary | ICD-10-CM | POA: Diagnosis present

## 2023-06-10 DIAGNOSIS — Z800 Family history of malignant neoplasm of digestive organs: Secondary | ICD-10-CM | POA: Diagnosis not present

## 2023-06-10 DIAGNOSIS — Z8546 Personal history of malignant neoplasm of prostate: Secondary | ICD-10-CM | POA: Diagnosis not present

## 2023-06-10 DIAGNOSIS — Z8042 Family history of malignant neoplasm of prostate: Secondary | ICD-10-CM | POA: Diagnosis not present

## 2023-06-10 DIAGNOSIS — Z96641 Presence of right artificial hip joint: Secondary | ICD-10-CM | POA: Diagnosis present

## 2023-06-10 DIAGNOSIS — Z85828 Personal history of other malignant neoplasm of skin: Secondary | ICD-10-CM | POA: Diagnosis not present

## 2023-06-10 DIAGNOSIS — I130 Hypertensive heart and chronic kidney disease with heart failure and stage 1 through stage 4 chronic kidney disease, or unspecified chronic kidney disease: Secondary | ICD-10-CM | POA: Diagnosis present

## 2023-06-10 DIAGNOSIS — Z96653 Presence of artificial knee joint, bilateral: Secondary | ICD-10-CM | POA: Diagnosis present

## 2023-06-10 DIAGNOSIS — Z79899 Other long term (current) drug therapy: Secondary | ICD-10-CM | POA: Diagnosis not present

## 2023-06-10 LAB — CBC
HCT: 42.3 % (ref 39.0–52.0)
Hemoglobin: 14.3 g/dL (ref 13.0–17.0)
MCH: 32.1 pg (ref 26.0–34.0)
MCHC: 33.8 g/dL (ref 30.0–36.0)
MCV: 94.8 fL (ref 80.0–100.0)
Platelets: 181 10*3/uL (ref 150–400)
RBC: 4.46 MIL/uL (ref 4.22–5.81)
RDW: 13.2 % (ref 11.5–15.5)
WBC: 7.4 10*3/uL (ref 4.0–10.5)
nRBC: 0 % (ref 0.0–0.2)

## 2023-06-10 LAB — COMPREHENSIVE METABOLIC PANEL
ALT: 10 U/L (ref 0–44)
AST: 10 U/L — ABNORMAL LOW (ref 15–41)
Albumin: 3.5 g/dL (ref 3.5–5.0)
Alkaline Phosphatase: 81 U/L (ref 38–126)
Anion gap: 8 (ref 5–15)
BUN: 21 mg/dL (ref 8–23)
CO2: 23 mmol/L (ref 22–32)
Calcium: 8.5 mg/dL — ABNORMAL LOW (ref 8.9–10.3)
Chloride: 111 mmol/L (ref 98–111)
Creatinine, Ser: 0.95 mg/dL (ref 0.61–1.24)
GFR, Estimated: >60 mL/min (ref >60)
Glucose, Bld: 94 mg/dL (ref 70–99)
Potassium: 3.8 mmol/L (ref 3.5–5.1)
Sodium: 142 mmol/L (ref 135–145)
Total Bilirubin: 1.6 mg/dL — ABNORMAL HIGH (ref 0.0–1.2)
Total Protein: 5.7 g/dL — ABNORMAL LOW (ref 6.5–8.1)

## 2023-06-10 MED ORDER — PHENOL 1.4 % MT LIQD: 1.0000 | OROMUCOSAL | Status: DC | PRN

## 2023-06-10 MED ORDER — MUSCLE RUB 10-15 % EX CREA
1.0000 | TOPICAL_CREAM | CUTANEOUS | Status: DC | PRN
Start: 2023-06-10 — End: 2023-06-11

## 2023-06-10 MED ORDER — HYDRALAZINE HCL 20 MG/ML IJ SOLN: 10.0000 mg | Freq: Four times a day (QID) | INTRAMUSCULAR | Status: DC | PRN

## 2023-06-10 MED ORDER — CARMEX CLASSIC LIP BALM EX OINT: 1.0000 | TOPICAL_OINTMENT | CUTANEOUS | Status: DC | PRN

## 2023-06-10 MED ORDER — LABETALOL HCL 5 MG/ML IV SOLN: 10.0000 mg | INTRAVENOUS | Status: DC | PRN

## 2023-06-10 NOTE — Plan of Care (Signed)

## 2023-06-10 NOTE — Progress Notes (Signed)
 Subjective/Chief Complaint: Patient with no abdominal pain.  He is passing gas.  Small bowel protocol shows contrast in the colon.   Objective: Vital signs in last 24 hours: Temp:  [97.3 F (36.3 C)-98 F (36.7 C)] 97.3 F (36.3 C) (03/24 0354) Pulse Rate:  [83-88] 83 (03/24 0354) Resp:  [18] 18 (03/24 0354) BP: (155-178)/(82-93) 155/82 (03/24 0354) SpO2:  [94 %] 94 % (03/24 0354) Last BM Date : 06/05/23  Intake/Output from previous day: 03/23 0701 - 03/24 0700 In: 180 [NG/GT:60] Out: 1000 [Urine:625; Emesis/NG output:375] Intake/Output this shift: Total I/O In: 30 [NG/GT:30] Out: 100 [Urine:100]  Abdomen: Soft nontender without rebound or guarding.  Minimal distention.  Lab Results:  Recent Labs    06/09/23 0244 06/10/23 0316  WBC 8.9 7.4  HGB 15.1 14.3  HCT 42.4 42.3  PLT 198 181   BMET Recent Labs    06/09/23 1009 06/10/23 0316  NA 140 142  K 4.1 3.8  CL 109 111  CO2 23 23  GLUCOSE 153* 94  BUN 26* 21  CREATININE 1.29* 0.95  CALCIUM 8.6* 8.5*   PT/INR No results for input(s): "LABPROT", "INR" in the last 72 hours. ABG No results for input(s): "PHART", "HCO3" in the last 72 hours.  Invalid input(s): "PCO2", "PO2"  Studies/Results: DG Abd Portable 1V-Small Bowel Obstruction Protocol-initial, 8 hr delay Result Date: 06/09/2023 CLINICAL DATA:  8 hour small-bowel follow-through EXAM: PORTABLE ABDOMEN - 1 VIEW COMPARISON:  Film from earlier in the same day. FINDINGS: Gastric catheter remains in the stomach. Previously seen contrast in the stomach now lies in the distal small bowel and proximal ascending colon. No small-bowel dilatation is noted. No free air is seen. IMPRESSION: Contrast within the distal small bowel and colon. No small bowel dilatation is noted. Electronically Signed   By: Alcide Clever M.D.   On: 06/09/2023 20:16   DG Abd Portable 1V-Small Bowel Protocol-Position Verification Result Date: 06/09/2023 CLINICAL DATA:  Nasogastric tube  placement. EXAM: PORTABLE ABDOMEN - 1 VIEW COMPARISON:  Same day at 0600 hours. FINDINGS: Nasogastric terminates in the stomach. Contrast injected confirms gastric position. IMPRESSION: Nasogastric tube terminates in the gastric antrum. Electronically Signed   By: Leanna Battles M.D.   On: 06/09/2023 14:22   DG Abd Portable 1 View Result Date: 06/09/2023 CLINICAL DATA:  NG tube placement. EXAM: PORTABLE ABDOMEN - 1 VIEW COMPARISON:  01/15/2020 FINDINGS: 2 different images provided. 2nd image obtained at 02602 hours shows the NG tube folded back upon itself in the region of the GE junction. The fold in the catheter is positioned at about the level of the GE junction with the terminal 5-6 cm of the NG tube tracking cranially back up into the distal esophagus. Visualized abdomen demonstrates nonspecific bowel gas pattern. IMPRESSION: On the second of the 2 images, the NG tube is folded back upon itself with the fold positioned in the region of the GE junction and terminal 5-6 cm of the NG tube tracking back up into the esophagus. Electronically Signed   By: Kennith Center M.D.   On: 06/09/2023 06:15   CT ABDOMEN PELVIS W CONTRAST Result Date: 06/09/2023 CLINICAL DATA:  78 year old male with lower abdominal pain for 4-5 hours. EXAM: CT ABDOMEN AND PELVIS WITH CONTRAST TECHNIQUE: Multidetector CT imaging of the abdomen and pelvis was performed using the standard protocol following bolus administration of intravenous contrast. RADIATION DOSE REDUCTION: This exam was performed according to the departmental dose-optimization program which includes automated exposure control, adjustment  of the mA and/or kV according to patient size and/or use of iterative reconstruction technique. CONTRAST:  OMNIPAQUE IOHEXOL 300 MG/ML  SOLN COMPARISON:  CT Abdomen and Pelvis 05/22/2023. FINDINGS: Lower chest: Mild bilateral lung base scarring appears stable from earlier this month. Heart size within normal limits. No pericardial  or pleural effusion. Hepatobiliary: Stable, negative liver and gallbladder. Pancreas: Stable pancreatic atrophy and dystrophic calcifications. Spleen: Stable and negative. Adrenals/Urinary Tract: Normal adrenal glands. Chronic bilateral renal cortical atrophy and polycystic renal changes (benign-appearing small cysts (no follow-up imaging recommended).). Bilateral nephrolithiasis. No hydronephrosis. No hydroureter. However within the urinary bladder there is 5 mm stone located at or near the left ureterovesical junction now on series 2, image 73. This is similar to the right UVJ region stone seen earlier in the month. The bladder is mostly decompressed. No convincing distal hydroureter or ureteral inflammation. Stomach/Bowel: Decompressed large bowel and terminal ileum. Small volume of free fluid in the right lower quadrant including adjacent to the cecum, simple fluid density. Appendix not identified. Stomach and duodenum decompressed. Proximal jejunal loops are normal. Small bowel the comes fluid-filled and dilated in the mid jejunum and continuing distally and the ileum is abnormal. Abnormal loops are dilated up to 42 mm diameter. And there is a small bowel feces sign within the right lower quadrant on series 2, image 63. Abrupt transition point on image 64 where the downstream small bowel appears thickened but still fluid-filled. Indistinct thickening of that loop slowly tapers, and distal loops are decompressed although with a small volume of fluid to the terminal ileum. No free air. Small volume of free fluid only in the right lower quadrant. No twisting of the mesentery. Vascular/Lymphatic: Aortic tortuosity. Aortoiliac calcified atherosclerosis. Mildly ulcerated plaque of the distal thoracic aorta suspected on coronal image 33, stable. No overt abdominal aortic aneurysm. Major arterial structures remain patent. Portal venous system appears patent. No lymphadenopathy. Reproductive: Negative. Other: No pelvis  free fluid. Musculoskeletal: Chronic severe lumbar spine degeneration with underlying widespread lumbar posterior and interbody fusion sequelae. Levels of chronic lumbar decompression. Chronic right hip arthroplasty. No acute osseous abnormality identified. IMPRESSION: 1. Positive for Small Bowel Obstruction, with a small bowel feces sign and transition point in the right lower quadrant on series 2, image 64. Circumferential bowel wall thickening at the transition is nonspecific but raises the possibility of infectious/inflammatory etiology. Small volume of reactive appearing free fluid in the right lower quadrant. No free air. 2. Chronic bilateral renal atrophy and nephrolithiasis. 5 mm stone within the bladder at or near the left UVJ, but no secondary findings of acute obstructive uropathy. 3.  Aortic Atherosclerosis (ICD10-I70.0). Electronically Signed   By: Odessa Fleming M.D.   On: 06/09/2023 05:10    Anti-infectives: Anti-infectives (From admission, onward)    None       Assessment/Plan: Small bowel obstruction-he has opened up on his films.  Recommend clamping NG tube.  If he tolerates that it can be discontinued later today.  Start clears after that advance diet tomorrow.  This is a recurrent problem for him and has been mild in the past.  We discussed how surgery potentially could be offered to him depending on how he does and patient preference.  Explained the risk of though of laparoscopy and laparotomy the circumstance especially with partial small bowel obstructions and the fact this could worsen and/or potentially lead to a bowel injury with questionable clinical value to him.  If he is not feeling better recurs, recommend  laparoscopy/laparotomy.  If he improves but still has concerns about these recurrent attacks, we can further discuss surgical options tomorrow.   LOS: 0 days    Dortha Schwalbe MD 06/10/2023 Moderate complexity

## 2023-06-10 NOTE — Progress Notes (Signed)
 PROGRESS NOTE    Corey [name]  WUJ:[id number] DOB: March 12, 1946 DOA: 06/09/2023 PCP: Garlan Fillers, MD  Chief Complaint  Patient presents with   Abdominal Pain    Hospital Course:  Corey [name] is 78 y.o. male with surgical history of perforated appendix requiring laparotomy and is 78 years old, history of ascending aortic aneurysm, CAD, kidney stones, prostate cancer with prior radiation treatment.  Patient initially presented to [facility] for 3 weeks of intermittent abdominal pain, nausea, vomiting.  He initially presented to the ED on 3/5, at that time CT scan revealed fluid-filled loops of distal small bowel as well as a 6 mm calculus in the right ureteral orifice.  He improved in the ED and was discharged home.  Over the day prior to arrival he has had ongoing nausea, cramping, abdominal pain and reports it has been multiple days since he had passed flatus or bowel movements.  CT scan reveals circumferential bowel wall thickening with a transition in the right lower quadrant and some fecalization of the small bowel.  There is also a small amount of reactive fluid in the pelvis.  An NG tube was placed and patient was admitted for general surgery evaluation.  Subjective: Patient endorses some flatulence this morning.  No bowel movements yet.  No abdominal pain currently.  Patient would like to discuss multiple different medications that he has seen commercials for.    Objective: Vitals:   06/09/23 0745 06/09/23 0849 06/09/23 2216 06/10/23 0354  BP: (!) 151/74 (!) 153/74 (!) 178/93 (!) 155/82  Pulse: 82 67 88 83  Resp: 15 16 18 18   Temp:  97.7 F (36.5 C) 98 F (36.7 C) (!) 97.3 F (36.3 C)  TempSrc:  Oral Oral   SpO2: 96% 95% 94% 94%  Weight:  84 kg    Height:  5\' 11"  (1.803 m)      Intake/Output Summary (Last 24 hours) at 06/10/2023 0745 Last data filed at 06/10/2023 9562 Gross per 24 hour  Intake 180 ml  Output 1000 ml  Net -820 ml   Filed Weights   06/09/23  0849  Weight: 84 kg    Examination: General exam: Appears calm and comfortable, NAD  Respiratory system: No work of breathing, symmetric chest wall expansion Cardiovascular system: S1 & S2 heard, RRR.  Gastrointestinal system: NG tube in place, abdomen is soft, nondistended, bowel sounds x 4 quadrants.  Nontender to palpation.  Neuro: Alert and oriented. No focal neurological deficits. Extremities: Symmetric, expected ROM Skin: No rashes, lesions Psychiatry: Demonstrates appropriate judgement and insight. Mood & affect appropriate for situation.   Assessment & Plan:  Principal Problem:   SBO (small bowel obstruction) (HCC) Active Problems:   Essential hypertension   Aneurysm of thoracic aorta (HCC)   G E R D   Coronary artery disease   Stage 3a chronic kidney disease (CKD) (HCC)   Malignant neoplasm of prostate (HCC)   Dyslipidemia   History of lumbar fusion   Grade I diastolic dysfunction    Small bowel obstruction - Likely secondary to adhesions.  History of laparotomy. - NG tube in place, proceed with clamping trial today - Continue maintenance IV fluids, still tolerating CLD. - Extensively discussed his disease process. - Continue with as needed analgesics and antiemetics - General Surgery consulted.  Appreciate further recommendations - Follow CBC and CMP, currently within normal limits - Keep electrolytes normalized  Hypertension - At home on amlodipine and losartan.  Will hold for now given  he is n.p.o. - As needed IV hydralazine and labetalol  Heart failure with preserved EF, grade 1 diastolic dysfunction - On ARB at home - Clinically euvolemic at this time - As needed Lasix  CAD - No current angina or palpitations - Resume home meds when tolerating p.o.  Aneurysm of thoracic aorta - Follows with cardiovascular surgery as an outpatient, continue  GERD - Continue PPI IV daily  CKD stage IIIa - Trend CMP - Avoid nephrotoxic meds when able - Renally  dosed with a creatinine clearance of 69 - Renal atrophy seen on CT  Malignant neoplasm of prostate - Status post radiation therapy - Continue following outpatient with urology  Dyslipidemia - Continue statin when p.o. intake is resumed.  Hold for now  History of lumbar fusion - As needed pain meds. - PT/OT when able to ambulate.  Bilateral nephrolithiasis Bladder stone - Incidentally seen on CT.  Bilateral nephrolithiasis, 5 mm stone within the urinary bladder near the UVJ. No distal hydroureter or inflammation - Continue to monitor, currently asymptomatic - Does have history of symptomatic nephrolithiasis.  DVT prophylaxis: Lovenox   Code Status: Full Code Family Communication:  Discussed directly with patient Disposition: Inpatient, still hospitalized for NG bowel decompression.  Monitor closely for return of bowel function. Clamping trial today. Consultants:  Treatment Team:  Consulting Physician: Montez Morita, Md, MD  Procedures:    Antimicrobials:  Anti-infectives (From admission, onward)    None       Data Reviewed: I have personally reviewed following labs and imaging studies CBC: Recent Labs  Lab 06/09/23 0244 06/10/23 0316  WBC 8.9 7.4  NEUTROABS 7.1  --   HGB 15.1 14.3  HCT 42.4 42.3  MCV 90.2 94.8  PLT 198 181   Basic Metabolic Panel: Recent Labs  Lab 06/09/23 0244 06/09/23 1009 06/10/23 0316  NA 141 140 142  K 3.5 4.1 3.8  CL 109 109 111  CO2 22 23 23   GLUCOSE 140* 153* 94  BUN 26* 26* 21  CREATININE 1.38* 1.29* 0.95  CALCIUM 9.0 8.6* 8.5*  MG  --  1.7  --   PHOS  --  3.4  --    GFR: Estimated Creatinine Clearance: 69.4 mL/min (by C-G formula based on SCr of 0.95 mg/dL). Liver Function Tests: Recent Labs  Lab 06/09/23 0244 06/10/23 0316  AST 12* 10*  ALT 9 10  ALKPHOS 98 81  BILITOT 1.2 1.6*  PROT 6.2* 5.7*  ALBUMIN 4.1 3.5   CBG: No results for input(s): "GLUCAP" in the last 168 hours.  No results found for this or any  previous visit (from the past 240 hours).   Radiology Studies: DG Abd Portable 1V-Small Bowel Obstruction Protocol-initial, 8 hr delay Result Date: 06/09/2023 CLINICAL DATA:  8 hour small-bowel follow-through EXAM: PORTABLE ABDOMEN - 1 VIEW COMPARISON:  Film from earlier in the same day. FINDINGS: Gastric catheter remains in the stomach. Previously seen contrast in the stomach now lies in the distal small bowel and proximal ascending colon. No small-bowel dilatation is noted. No free air is seen. IMPRESSION: Contrast within the distal small bowel and colon. No small bowel dilatation is noted. Electronically Signed   By: Alcide Clever M.D.   On: 06/09/2023 20:16   DG Abd Portable 1V-Small Bowel Protocol-Position Verification Result Date: 06/09/2023 CLINICAL DATA:  Nasogastric tube placement. EXAM: PORTABLE ABDOMEN - 1 VIEW COMPARISON:  Same day at 0600 hours. FINDINGS: Nasogastric terminates in the stomach. Contrast injected confirms gastric position. IMPRESSION: Nasogastric  tube terminates in the gastric antrum. Electronically Signed   By: Leanna Battles M.D.   On: 06/09/2023 14:22   DG Abd Portable 1 View Result Date: 06/09/2023 CLINICAL DATA:  NG tube placement. EXAM: PORTABLE ABDOMEN - 1 VIEW COMPARISON:  01/15/2020 FINDINGS: 2 different images provided. 2nd image obtained at 02602 hours shows the NG tube folded back upon itself in the region of the GE junction. The fold in the catheter is positioned at about the level of the GE junction with the terminal 5-6 cm of the NG tube tracking cranially back up into the distal esophagus. Visualized abdomen demonstrates nonspecific bowel gas pattern. IMPRESSION: On the second of the 2 images, the NG tube is folded back upon itself with the fold positioned in the region of the GE junction and terminal 5-6 cm of the NG tube tracking back up into the esophagus. Electronically Signed   By: Kennith Center M.D.   On: 06/09/2023 06:15   CT ABDOMEN PELVIS W  CONTRAST Result Date: 06/09/2023 CLINICAL DATA:  78 year old male with lower abdominal pain for 4-5 hours. EXAM: CT ABDOMEN AND PELVIS WITH CONTRAST TECHNIQUE: Multidetector CT imaging of the abdomen and pelvis was performed using the standard protocol following bolus administration of intravenous contrast. RADIATION DOSE REDUCTION: This exam was performed according to the departmental dose-optimization program which includes automated exposure control, adjustment of the mA and/or kV according to patient size and/or use of iterative reconstruction technique. CONTRAST:  OMNIPAQUE IOHEXOL 300 MG/ML  SOLN COMPARISON:  CT Abdomen and Pelvis 05/22/2023. FINDINGS: Lower chest: Mild bilateral lung base scarring appears stable from earlier this month. Heart size within normal limits. No pericardial or pleural effusion. Hepatobiliary: Stable, negative liver and gallbladder. Pancreas: Stable pancreatic atrophy and dystrophic calcifications. Spleen: Stable and negative. Adrenals/Urinary Tract: Normal adrenal glands. Chronic bilateral renal cortical atrophy and polycystic renal changes (benign-appearing small cysts (no follow-up imaging recommended).). Bilateral nephrolithiasis. No hydronephrosis. No hydroureter. However within the urinary bladder there is 5 mm stone located at or near the left ureterovesical junction now on series 2, image 73. This is similar to the right UVJ region stone seen earlier in the month. The bladder is mostly decompressed. No convincing distal hydroureter or ureteral inflammation. Stomach/Bowel: Decompressed large bowel and terminal ileum. Small volume of free fluid in the right lower quadrant including adjacent to the cecum, simple fluid density. Appendix not identified. Stomach and duodenum decompressed. Proximal jejunal loops are normal. Small bowel the comes fluid-filled and dilated in the mid jejunum and continuing distally and the ileum is abnormal. Abnormal loops are dilated up to 42  mm diameter. And there is a small bowel feces sign within the right lower quadrant on series 2, image 63. Abrupt transition point on image 64 where the downstream small bowel appears thickened but still fluid-filled. Indistinct thickening of that loop slowly tapers, and distal loops are decompressed although with a small volume of fluid to the terminal ileum. No free air. Small volume of free fluid only in the right lower quadrant. No twisting of the mesentery. Vascular/Lymphatic: Aortic tortuosity. Aortoiliac calcified atherosclerosis. Mildly ulcerated plaque of the distal thoracic aorta suspected on coronal image 33, stable. No overt abdominal aortic aneurysm. Major arterial structures remain patent. Portal venous system appears patent. No lymphadenopathy. Reproductive: Negative. Other: No pelvis free fluid. Musculoskeletal: Chronic severe lumbar spine degeneration with underlying widespread lumbar posterior and interbody fusion sequelae. Levels of chronic lumbar decompression. Chronic right hip arthroplasty. No acute osseous abnormality identified. IMPRESSION:  1. Positive for Small Bowel Obstruction, with a small bowel feces sign and transition point in the right lower quadrant on series 2, image 64. Circumferential bowel wall thickening at the transition is nonspecific but raises the possibility of infectious/inflammatory etiology. Small volume of reactive appearing free fluid in the right lower quadrant. No free air. 2. Chronic bilateral renal atrophy and nephrolithiasis. 5 mm stone within the bladder at or near the left UVJ, but no secondary findings of acute obstructive uropathy. 3.  Aortic Atherosclerosis (ICD10-I70.0). Electronically Signed   By: Odessa Fleming M.D.   On: 06/09/2023 05:10    Scheduled Meds:  enoxaparin (LOVENOX) injection  40 mg Subcutaneous Q24H   pantoprazole (PROTONIX) IV  40 mg Intravenous Q24H   Continuous Infusions:   LOS: 0 days  MDM: Patient is high risk for one or more organ  failure.  They necessitate ongoing hospitalization for continued IV therapies and subsequent lab monitoring. Total time spent interpreting labs and vitals, coordinating care amongst consultants and care team members, directly assessing and discussing care with the patient and/or family: 55 min    Debarah Crape, DO [facility]  To contact the attending physician between 7A-7P please use Epic Chat. To contact the covering physician during after hours 7P-7A, please review Amion.   06/10/2023, 7:45 AM   *This document has been created with the assistance of dictation software. Please excuse typographical errors. *

## 2023-06-11 DIAGNOSIS — K56609 Unspecified intestinal obstruction, unspecified as to partial versus complete obstruction: Secondary | ICD-10-CM | POA: Diagnosis not present

## 2023-06-11 LAB — CBC WITH DIFFERENTIAL/PLATELET
Abs Immature Granulocytes: 0.01 10*3/uL (ref 0.00–0.07)
Basophils Absolute: 0 10*3/uL (ref 0.0–0.1)
Basophils Relative: 0 %
Eosinophils Absolute: 0.1 10*3/uL (ref 0.0–0.5)
Eosinophils Relative: 2 %
HCT: 40.4 % (ref 39.0–52.0)
Hemoglobin: 13.8 g/dL (ref 13.0–17.0)
Immature Granulocytes: 0 %
Lymphocytes Relative: 18 %
Lymphs Abs: 0.9 10*3/uL (ref 0.7–4.0)
MCH: 32.2 pg (ref 26.0–34.0)
MCHC: 34.2 g/dL (ref 30.0–36.0)
MCV: 94.2 fL (ref 80.0–100.0)
Monocytes Absolute: 0.4 10*3/uL (ref 0.1–1.0)
Monocytes Relative: 8 %
Neutro Abs: 3.6 10*3/uL (ref 1.7–7.7)
Neutrophils Relative %: 72 %
Platelets: 175 10*3/uL (ref 150–400)
RBC: 4.29 MIL/uL (ref 4.22–5.81)
RDW: 12.9 % (ref 11.5–15.5)
WBC: 5.1 10*3/uL (ref 4.0–10.5)
nRBC: 0 % (ref 0.0–0.2)

## 2023-06-11 LAB — COMPREHENSIVE METABOLIC PANEL
ALT: 11 U/L (ref 0–44)
AST: 11 U/L — ABNORMAL LOW (ref 15–41)
Albumin: 3.1 g/dL — ABNORMAL LOW (ref 3.5–5.0)
Alkaline Phosphatase: 68 U/L (ref 38–126)
Anion gap: 5 (ref 5–15)
BUN: 17 mg/dL (ref 8–23)
CO2: 22 mmol/L (ref 22–32)
Calcium: 8.5 mg/dL — ABNORMAL LOW (ref 8.9–10.3)
Chloride: 110 mmol/L (ref 98–111)
Creatinine, Ser: 1.17 mg/dL (ref 0.61–1.24)
GFR, Estimated: >60 mL/min (ref >60)
Glucose, Bld: 89 mg/dL (ref 70–99)
Potassium: 3.8 mmol/L (ref 3.5–5.1)
Sodium: 137 mmol/L (ref 135–145)
Total Bilirubin: 1.8 mg/dL — ABNORMAL HIGH (ref 0.0–1.2)
Total Protein: 5.6 g/dL — ABNORMAL LOW (ref 6.5–8.1)

## 2023-06-11 LAB — MAGNESIUM: Magnesium: 1.9 mg/dL (ref 1.7–2.4)

## 2023-06-11 LAB — PHOSPHORUS: Phosphorus: 2.4 mg/dL — ABNORMAL LOW (ref 2.5–4.6)

## 2023-06-11 MED ORDER — LOSARTAN POTASSIUM 100 MG PO TABS
100.0000 mg | ORAL_TABLET | Freq: Every day | ORAL | 0 refills | Status: DC
Start: 2023-06-11 — End: 2023-07-16

## 2023-06-11 MED ORDER — SODIUM PHOSPHATES 45 MMOLE/15ML IV SOLN
15.0000 mmol | Freq: Once | INTRAVENOUS | Status: AC
  Administered 2023-06-11: 15 mmol via INTRAVENOUS
  Filled 2023-06-11: qty 5

## 2023-06-11 NOTE — Progress Notes (Incomplete)
 PROGRESS NOTE    Corey [name]  ZOX:[id number] DOB: [birth year] DOA: 06/09/2023 PCP: Garlan Fillers, MD  Chief Complaint  Patient presents with   Abdominal Pain    Hospital Course:  Corey [name] is 78 y.o. male with surgical history of perforated appendix requiring laparotomy and is 78 years old, history of ascending aortic aneurysm, CAD, kidney stones, prostate cancer with prior radiation treatment.  Patient initially presented to [facility] for 3 weeks of intermittent abdominal pain, nausea, vomiting.  He initially presented to the ED on 3/5, at that time CT scan revealed fluid-filled loops of distal small bowel as well as a 6 mm calculus in the right ureteral orifice.  He improved in the ED and was discharged home.  Over the day prior to arrival he has had ongoing nausea, cramping, abdominal pain and reports it has been multiple days since he had passed flatus or bowel movements.  CT scan reveals circumferential bowel wall thickening with a transition in the right lower quadrant and some fecalization of the small bowel.  There is also a small amount of reactive fluid in the pelvis.  An NG tube was placed and patient was admitted for general surgery evaluation.  Subjective: Patient endorses some flatulence this morning.  No bowel movements yet.  No abdominal pain currently.  Patient would like to discuss multiple different medications that he has seen commercials for.    Objective: Vitals:   06/10/23 0354 06/10/23 1355 06/10/23 2200 06/11/23 0545  BP: (!) 155/82 (!) 158/79 (!) 160/81 (!) 153/81  Pulse: 83 78 69 (!) 57  Resp: 18 16 18 18   Temp: (!) 97.3 F (36.3 C)  98.3 F (36.8 C) 98.2 F (36.8 C)  TempSrc:   Oral Oral  SpO2: 94% 92% 98% 95%  Weight:      Height:        Intake/Output Summary (Last 24 hours) at 06/11/2023 0912 Last data filed at 06/11/2023 0545 Gross per 24 hour  Intake 370 ml  Output 400 ml  Net -30 ml   Filed Weights   06/09/23 0849  Weight: 84  kg    Examination: General exam: Appears calm and comfortable, NAD  Respiratory system: No work of breathing, symmetric chest wall expansion Cardiovascular system: S1 & S2 heard, RRR.  Gastrointestinal system: NG tube in place, abdomen is soft, nondistended, bowel sounds x 4 quadrants.  Nontender to palpation.  Neuro: Alert and oriented. No focal neurological deficits. Extremities: Symmetric, expected ROM Skin: No rashes, lesions Psychiatry: Demonstrates appropriate judgement and insight. Mood & affect appropriate for situation.   Assessment & Plan:  Principal Problem:   SBO (small bowel obstruction) (HCC) Active Problems:   Essential hypertension   Aneurysm of thoracic aorta (HCC)   G E R D   Coronary artery disease   Stage 3a chronic kidney disease (CKD) (HCC)   Malignant neoplasm of prostate (HCC)   Dyslipidemia   History of lumbar fusion   Grade I diastolic dysfunction  {Tip this will not be part of the note when signed Body mass index is 25.83 kg/m. , ,  (Optional):26781}  Small bowel obstruction - Likely secondary to adhesions.  History of laparotomy. - NG tube in place, proceed with clamping trial today - Continue maintenance IV fluids, still tolerating CLD. - Extensively discussed his disease process. - Continue with as needed analgesics and antiemetics - General Surgery consulted.  Appreciate further recommendations - Follow CBC and CMP, currently within normal limits - Keep  electrolytes normalized  Hypertension - At home on amlodipine and losartan.  Will hold for now given he is n.p.o. - As needed IV hydralazine and labetalol  Heart failure with preserved EF, grade 1 diastolic dysfunction - On ARB at home - Clinically euvolemic at this time - As needed Lasix  CAD - No current angina or palpitations - Resume home meds when tolerating p.o.  Aneurysm of thoracic aorta - Follows with cardiovascular surgery as an outpatient, continue  GERD - Continue PPI  IV daily  CKD stage IIIa - Trend CMP - Avoid nephrotoxic meds when able - Renally dosed with a creatinine clearance of 69 - Renal atrophy seen on CT  Malignant neoplasm of prostate - Status post radiation therapy - Continue following outpatient with urology  Dyslipidemia - Continue statin when p.o. intake is resumed.  Hold for now  History of lumbar fusion - As needed pain meds. - PT/OT when able to ambulate.  Bilateral nephrolithiasis Bladder stone - Incidentally seen on CT.  Bilateral nephrolithiasis, 5 mm stone within the urinary bladder near the UVJ. No distal hydroureter or inflammation - Continue to monitor, currently asymptomatic - Does have history of symptomatic nephrolithiasis.  DVT prophylaxis: Lovenox   Code Status: Full Code Family Communication:  Discussed directly with patient Disposition: Inpatient, still hospitalized for NG bowel decompression.  Monitor closely for return of bowel function. Clamping trial today. Consultants:  Treatment Team:  Consulting Physician: Montez Morita, Md, MD  Procedures:    Antimicrobials:  Anti-infectives (From admission, onward)    None       Data Reviewed: I have personally reviewed following labs and imaging studies CBC: Recent Labs  Lab 06/09/23 0244 06/10/23 0316 06/11/23 0324  WBC 8.9 7.4 5.1  NEUTROABS 7.1  --  3.6  HGB 15.1 14.3 13.8  HCT 42.4 42.3 40.4  MCV 90.2 94.8 94.2  PLT 198 181 175   Basic Metabolic Panel: Recent Labs  Lab 06/09/23 0244 06/09/23 1009 06/10/23 0316 06/11/23 0324  NA 141 140 142 137  K 3.5 4.1 3.8 3.8  CL 109 109 111 110  CO2 22 23 23 22   GLUCOSE 140* 153* 94 89  BUN 26* 26* 21 17  CREATININE 1.38* 1.29* 0.95 1.17  CALCIUM 9.0 8.6* 8.5* 8.5*  MG  --  1.7  --  1.9  PHOS  --  3.4  --  2.4*   GFR: Estimated Creatinine Clearance: 56.3 mL/min (by C-G formula based on SCr of 1.17 mg/dL). Liver Function Tests: Recent Labs  Lab 06/09/23 0244 06/10/23 0316 06/11/23 0324  AST  12* 10* 11*  ALT 9 10 11   ALKPHOS 98 81 68  BILITOT 1.2 1.6* 1.8*  PROT 6.2* 5.7* 5.6*  ALBUMIN 4.1 3.5 3.1*   CBG: No results for input(s): "GLUCAP" in the last 168 hours.  No results found for this or any previous visit (from the past 240 hours).   Radiology Studies: DG Abd Portable 1V Result Date: 06/10/2023 CLINICAL DATA:  Bowel obstruction EXAM: PORTABLE ABDOMEN - 1 VIEW COMPARISON:  06/10/2023 FINDINGS: Enteral contrast within the colon. Nonobstructed gas pattern. Hardware in the lumbar spine and right hip IMPRESSION: Enteral contrast within the colon. Nonobstructed gas pattern. Electronically Signed   By: Jasmine Pang M.D.   On: 06/10/2023 16:28   DG Abd 1 View Result Date: 06/10/2023 CLINICAL DATA:  Follow-up bowel obstruction EXAM: ABDOMEN - 1 VIEW COMPARISON:  06/09/2023, CT 06/09/2023 FINDINGS: Enteral contrast is in the colon. Overall nonobstructed gas pattern.  Hardware in the spine and right hip IMPRESSION: Enteral contrast in the colon. Overall nonobstructed gas pattern. Electronically Signed   By: Jasmine Pang M.D.   On: 06/10/2023 16:27   DG Abd Portable 1V-Small Bowel Obstruction Protocol-initial, 8 hr delay Result Date: 06/09/2023 CLINICAL DATA:  8 hour small-bowel follow-through EXAM: PORTABLE ABDOMEN - 1 VIEW COMPARISON:  Film from earlier in the same day. FINDINGS: Gastric catheter remains in the stomach. Previously seen contrast in the stomach now lies in the distal small bowel and proximal ascending colon. No small-bowel dilatation is noted. No free air is seen. IMPRESSION: Contrast within the distal small bowel and colon. No small bowel dilatation is noted. Electronically Signed   By: Alcide Clever M.D.   On: 06/09/2023 20:16   DG Abd Portable 1V-Small Bowel Protocol-Position Verification Result Date: 06/09/2023 CLINICAL DATA:  Nasogastric tube placement. EXAM: PORTABLE ABDOMEN - 1 VIEW COMPARISON:  Same day at 0600 hours. FINDINGS: Nasogastric terminates in the  stomach. Contrast injected confirms gastric position. IMPRESSION: Nasogastric tube terminates in the gastric antrum. Electronically Signed   By: Leanna Battles M.D.   On: 06/09/2023 14:22    Scheduled Meds:  enoxaparin (LOVENOX) injection  40 mg Subcutaneous Q24H   pantoprazole (PROTONIX) IV  40 mg Intravenous Q24H   Continuous Infusions:  sodium PHOSPHATE IVPB (in mmol)       LOS: 1 day  MDM: Patient is high risk for one or more organ failure.  They necessitate ongoing hospitalization for continued IV therapies and subsequent lab monitoring. Total time spent interpreting labs and vitals, coordinating care amongst consultants and care team members, directly assessing and discussing care with the patient and/or family: 55 min    Marolyn Haller, DO [facility]  To contact the attending physician between 7A-7P please use Epic Chat. To contact the covering physician during after hours 7P-7A, please review Amion.   06/11/2023, 9:12 AM   *This document has been created with the assistance of dictation software. Please excuse typographical errors. *

## 2023-06-11 NOTE — Plan of Care (Signed)
 Educated client on diets, increasing fiber intake, and encourage water intake.  Discussed exercise regimen and expressed the importance of increasing daily physical activity.

## 2023-06-11 NOTE — Discharge Instructions (Addendum)
 Plese follow up with your primary care doctor in 1 week. Please gradually return your diet to normal.   Please discuss with your primary care doctor the need to continue potassium

## 2023-06-11 NOTE — Progress Notes (Signed)
 Progress Note     Subjective: Pt tolerating CLD and had a BM overnight. Denies abdominal pain, nausea or vomiting. Hopeful to go home later today if tolerating diet. Plans to increase activity and fluid intake and considering bowel regimen at home to keep regular.   Objective: Vital signs in last 24 hours: Temp:  [98.2 F (36.8 C)-98.3 F (36.8 C)] 98.2 F (36.8 C) (03/25 0545) Pulse Rate:  [57-78] 57 (03/25 0545) Resp:  [16-18] 18 (03/25 0545) BP: (153-160)/(79-81) 153/81 (03/25 0545) SpO2:  [92 %-98 %] 95 % (03/25 0545) Last BM Date : 06/10/23  Intake/Output from previous day: 03/24 0701 - 03/25 0700 In: 400 [P.O.:340; NG/GT:60] Out: 500 [Urine:500] Intake/Output this shift: No intake/output data recorded.  PE: General: pleasant, WD, WN male who is laying in bed in NAD Heart: regular, rate, and rhythm.   Lungs: Respiratory effort nonlabored Abd: soft, NT, ND Psych: A&Ox3 with an appropriate affect.    Lab Results:  Recent Labs    06/10/23 0316 06/11/23 0324  WBC 7.4 5.1  HGB 14.3 13.8  HCT 42.3 40.4  PLT 181 175   BMET Recent Labs    06/10/23 0316 06/11/23 0324  NA 142 137  K 3.8 3.8  CL 111 110  CO2 23 22  GLUCOSE 94 89  BUN 21 17  CREATININE 0.95 1.17  CALCIUM 8.5* 8.5*   PT/INR No results for input(s): "LABPROT", "INR" in the last 72 hours. CMP     Component Value Date/Time   NA 137 06/11/2023 0324   NA 144 05/12/2019 1201   K 3.8 06/11/2023 0324   CL 110 06/11/2023 0324   CO2 22 06/11/2023 0324   GLUCOSE 89 06/11/2023 0324   BUN 17 06/11/2023 0324   BUN 22 05/12/2019 1201   CREATININE 1.17 06/11/2023 0324   CREATININE 1.55 (H) 03/22/2016 1326   CALCIUM 8.5 (L) 06/11/2023 0324   PROT 5.6 (L) 06/11/2023 0324   ALBUMIN 3.1 (L) 06/11/2023 0324   AST 11 (L) 06/11/2023 0324   ALT 11 06/11/2023 0324   ALKPHOS 68 06/11/2023 0324   BILITOT 1.8 (H) 06/11/2023 0324   GFRNONAA >60 06/11/2023 0324   GFRAA 38 (L) 10/23/2019 1242   Lipase      Component Value Date/Time   LIPASE 16 06/09/2023 0244       Studies/Results: DG Abd Portable 1V Result Date: 06/10/2023 CLINICAL DATA:  Bowel obstruction EXAM: PORTABLE ABDOMEN - 1 VIEW COMPARISON:  06/10/2023 FINDINGS: Enteral contrast within the colon. Nonobstructed gas pattern. Hardware in the lumbar spine and right hip IMPRESSION: Enteral contrast within the colon. Nonobstructed gas pattern. Electronically Signed   By: Jasmine Pang M.D.   On: 06/10/2023 16:28   DG Abd 1 View Result Date: 06/10/2023 CLINICAL DATA:  Follow-up bowel obstruction EXAM: ABDOMEN - 1 VIEW COMPARISON:  06/09/2023, CT 06/09/2023 FINDINGS: Enteral contrast is in the colon. Overall nonobstructed gas pattern. Hardware in the spine and right hip IMPRESSION: Enteral contrast in the colon. Overall nonobstructed gas pattern. Electronically Signed   By: Jasmine Pang M.D.   On: 06/10/2023 16:27   DG Abd Portable 1V-Small Bowel Obstruction Protocol-initial, 8 hr delay Result Date: 06/09/2023 CLINICAL DATA:  8 hour small-bowel follow-through EXAM: PORTABLE ABDOMEN - 1 VIEW COMPARISON:  Film from earlier in the same day. FINDINGS: Gastric catheter remains in the stomach. Previously seen contrast in the stomach now lies in the distal small bowel and proximal ascending colon. No small-bowel dilatation is noted. No free  air is seen. IMPRESSION: Contrast within the distal small bowel and colon. No small bowel dilatation is noted. Electronically Signed   By: Alcide Clever M.D.   On: 06/09/2023 20:16    Anti-infectives: Anti-infectives (From admission, onward)    None        Assessment/Plan  SBO - CT 3/23 with transition in the RLQ and circumferential bowel wall thickening at the transition, question infectious vs inflammatory cause - SBO protocol started and patient passed contrast to colon and now having more bowel function  - advance to soft/low fiber diet this AM - if tolerating low fiber diet and having bowel  function then stable for DC from surgical standpoint  - consider regular bowel regimen at home with miralax/colace - low fiber diet at least initially  - no indication for acute surgical intervention  - encouraged mobilization   FEN: low fiber, SLIV VTE: LMWH ID: no current abx  - per [facility] -  HTN CAD Ascending aortic aneurysm  Hx of prostate cancer CKD stage IIIa HLD HFpEF  LOS: 1 day   I reviewed hospitalist notes, last 24 h vitals and pain scores, last 48 h intake and output, last 24 h labs and trends, and last 24 h imaging results.  This care required moderate level of medical decision making.    Juliet Rude, [facility] Surgery 06/11/2023, 11:37 AM Please see Amion for pager number during day hours 7:00am-4:30pm

## 2023-06-12 DIAGNOSIS — K56609 Unspecified intestinal obstruction, unspecified as to partial versus complete obstruction: Secondary | ICD-10-CM | POA: Diagnosis not present

## 2023-06-12 NOTE — Discharge Summary (Signed)
 Physician Discharge Summary   Patient: Corey RICHTER MRN: [id number] DOB: Jul 31, 1945  Admit date:     06/09/2023  Discharge date: 06/11/2023  Discharge Physician: Marolyn Haller   PCP: Garlan Fillers, MD   Recommendations at discharge:    [facility] ensure renal function is stable.  Needs repeat ct of the chest to monitor TAA, sees Dr. [place] for this   Discharge Diagnoses: Principal Problem:   SBO (small bowel obstruction) (HCC) Active Problems:   Essential hypertension   Aneurysm of thoracic aorta (HCC)   G E R D   Coronary artery disease   Stage 3a chronic kidney disease (CKD) (HCC)   Malignant neoplasm of prostate (HCC)   Dyslipidemia   History of lumbar fusion   Grade I diastolic dysfunction  Resolved Problems:   * No resolved hospital problems. [facility] Course: By problem below.   Assessment and Plan: Small bowel obstruction CT A/P 3/23 with transition point in RLQ. General surgery was consulted and patient was managed conservatively. He did have SBO protocol x ray and was noted to have contrast passing through to the rectum. Patient eventually had bowel movements and was able to tolerate a soft diet.    Hypertension Mildly elevated. Patient antihypertensives were held while npo. These were resumed on discharged.    Heart failure with preserved EF, grade 1 diastolic dysfunction Appeared euvolemic. Not on SGLTi. This can be considered in the outpatient setting. On medications for his hypertension.    CAD Non obstructive LHC 09/2022. On statin and antihypertensives.    Aneurysm of thoracic aorta - 4.3cm on CT 05/2019 was supposed to have follow up every three years. Needs to have this.    GERD - Continue PPI IV daily   CKD stage II v IIIa    Latest Ref Rng & Units 06/11/2023    3:24 AM 06/10/2023    3:16 AM 06/09/2023   10:09 AM  BMP  Glucose 70 - 99 mg/dL 89  94  956   BUN 8 - 23 mg/dL 17  21  26    Creatinine 0.61 - 1.24 mg/dL 2.13  0.86  5.78   Sodium  135 - 145 mmol/L 137  142  140   Potassium 3.5 - 5.1 mmol/L 3.8  3.8  4.1   Chloride 98 - 111 mmol/L 110  111  109   CO2 22 - 32 mmol/L 22  23  23    Calcium 8.9 - 10.3 mg/dL 8.5  8.5  8.6   Creatinine improved on discharged.   Malignant neoplasm of prostate - Status post radiation therapy - Continue following outpatient with urology   Dyslipidemia Statin resumed on discharged.    History of lumbar fusion Noted.    Bilateral nephrolithiasis Bladder stone - Incidentally seen on CT.  Bilateral nephrolithiasis, 5 mm stone within the urinary bladder near the UVJ. No distal hydroureter or inflammation - Does have history of symptomatic nephrolithiasis. --No inpatient intervention was warranted.         Consultants: General surgery  Procedures performed: none  Disposition: Home Diet recommendation:  Regular diet DISCHARGE MEDICATION: Allergies as of 06/11/2023       Reactions   Ultram [tramadol] Shortness Of Breath, Other (See Comments)   Elevated heart rate   Neurontin [gabapentin] Other (See Comments)   Dizziness    Aldara [imiquimod] Other (See Comments)   Blisters on application site.        Medication List     TAKE these  medications    ALPRAZolam 0.25 MG tablet Commonly known as: XANAX Take 0.25 mg by mouth at bedtime.   amLODipine 5 MG tablet Commonly known as: NORVASC TAKE 1 TABLET BY MOUTH EVERY DAY   Centrum Silver 50+Men Tabs Take 1 tablet by mouth daily.   docusate sodium 100 MG capsule Commonly known as: COLACE Take 1 capsule (100 mg total) by mouth 2 (two) times daily.   fexofenadine 180 MG tablet Commonly known as: ALLEGRA Take 180 mg by mouth daily.   losartan 100 MG tablet Commonly known as: COZAAR Take 1 tablet (100 mg total) by mouth daily.   pantoprazole 40 MG tablet Commonly known as: PROTONIX Take 1 tablet (40 mg total) by mouth daily.   potassium citrate 10 MEQ (1080 MG) SR tablet Commonly known as: UROCIT-K Take 20 mEq  by mouth 2 (two) times daily.   pravastatin 40 MG tablet Commonly known as: PRAVACHOL Take 40 mg by mouth at bedtime.   tamsulosin 0.4 MG Caps capsule Commonly known as: FLOMAX Take 0.8 mg by mouth at bedtime.        Follow-up Information     Garlan Fillers, MD. Schedule an appointment as soon as possible for a visit in 1 week(s).   Specialty: Internal Medicine Contact information: [address] [place] [address] [phone]                Discharge Exam: Ceasar Mons Weights   06/09/23 0849  Weight: 84 kg   Physical Exam  Constitutional: In no distress.  Cardiovascular: Normal rate, regular rhythm. No lower extremity edema  Pulmonary: Non labored breathing on room air, no wheezing or rales.   Abdominal: Soft. Mildly distended and non tender Musculoskeletal: Normal range of motion.     Neurological: Alert and oriented to person, place, and time. Non focal  Skin: Skin is warm and dry.    Condition at discharge: good  The results of significant diagnostics from this hospitalization (including imaging, microbiology, ancillary and laboratory) are listed below for reference.   Imaging Studies: DG Abd Portable 1V Result Date: 06/10/2023 CLINICAL DATA:  Bowel obstruction EXAM: PORTABLE ABDOMEN - 1 VIEW COMPARISON:  06/10/2023 FINDINGS: Enteral contrast within the colon. Nonobstructed gas pattern. Hardware in the lumbar spine and right hip IMPRESSION: Enteral contrast within the colon. Nonobstructed gas pattern. Electronically Signed   By: Jasmine Pang M.D.   On: 06/10/2023 16:28   DG Abd 1 View Result Date: 06/10/2023 CLINICAL DATA:  Follow-up bowel obstruction EXAM: ABDOMEN - 1 VIEW COMPARISON:  06/09/2023, CT 06/09/2023 FINDINGS: Enteral contrast is in the colon. Overall nonobstructed gas pattern. Hardware in the spine and right hip IMPRESSION: Enteral contrast in the colon. Overall nonobstructed gas pattern. Electronically Signed   By: Jasmine Pang M.D.   On:  06/10/2023 16:27   DG Abd Portable 1V-Small Bowel Obstruction Protocol-initial, 8 hr delay Result Date: 06/09/2023 CLINICAL DATA:  8 hour small-bowel follow-through EXAM: PORTABLE ABDOMEN - 1 VIEW COMPARISON:  Film from earlier in the same day. FINDINGS: Gastric catheter remains in the stomach. Previously seen contrast in the stomach now lies in the distal small bowel and proximal ascending colon. No small-bowel dilatation is noted. No free air is seen. IMPRESSION: Contrast within the distal small bowel and colon. No small bowel dilatation is noted. Electronically Signed   By: Alcide Clever M.D.   On: 06/09/2023 20:16   DG Abd Portable 1V-Small Bowel Protocol-Position Verification Result Date: 06/09/2023 CLINICAL DATA:  Nasogastric tube placement.  EXAM: PORTABLE ABDOMEN - 1 VIEW COMPARISON:  Same day at 0600 hours. FINDINGS: Nasogastric terminates in the stomach. Contrast injected confirms gastric position. IMPRESSION: Nasogastric tube terminates in the gastric antrum. Electronically Signed   By: Leanna Battles M.D.   On: 06/09/2023 14:22   DG Abd Portable 1 View Result Date: 06/09/2023 CLINICAL DATA:  NG tube placement. EXAM: PORTABLE ABDOMEN - 1 VIEW COMPARISON:  01/15/2020 FINDINGS: 2 different images provided. 2nd image obtained at 02602 hours shows the NG tube folded back upon itself in the region of the GE junction. The fold in the catheter is positioned at about the level of the GE junction with the terminal 5-6 cm of the NG tube tracking cranially back up into the distal esophagus. Visualized abdomen demonstrates nonspecific bowel gas pattern. IMPRESSION: On the second of the 2 images, the NG tube is folded back upon itself with the fold positioned in the region of the GE junction and terminal 5-6 cm of the NG tube tracking back up into the esophagus. Electronically Signed   By: Kennith Center M.D.   On: 06/09/2023 06:15   CT ABDOMEN PELVIS W CONTRAST Result Date: 06/09/2023 CLINICAL DATA:   78 year old male with lower abdominal pain for 4-5 hours. EXAM: CT ABDOMEN AND PELVIS WITH CONTRAST TECHNIQUE: Multidetector CT imaging of the abdomen and pelvis was performed using the standard protocol following bolus administration of intravenous contrast. RADIATION DOSE REDUCTION: This exam was performed according to the departmental dose-optimization program which includes automated exposure control, adjustment of the mA and/or kV according to patient size and/or use of iterative reconstruction technique. CONTRAST:  OMNIPAQUE IOHEXOL 300 MG/ML  SOLN COMPARISON:  CT Abdomen and Pelvis 05/22/2023. FINDINGS: Lower chest: Mild bilateral lung base scarring appears stable from earlier this month. Heart size within normal limits. No pericardial or pleural effusion. Hepatobiliary: Stable, negative liver and gallbladder. Pancreas: Stable pancreatic atrophy and dystrophic calcifications. Spleen: Stable and negative. Adrenals/Urinary Tract: Normal adrenal glands. Chronic bilateral renal cortical atrophy and polycystic renal changes (benign-appearing small cysts (no follow-up imaging recommended).). Bilateral nephrolithiasis. No hydronephrosis. No hydroureter. However within the urinary bladder there is 5 mm stone located at or near the left ureterovesical junction now on series 2, image 73. This is similar to the right UVJ region stone seen earlier in the month. The bladder is mostly decompressed. No convincing distal hydroureter or ureteral inflammation. Stomach/Bowel: Decompressed large bowel and terminal ileum. Small volume of free fluid in the right lower quadrant including adjacent to the cecum, simple fluid density. Appendix not identified. Stomach and duodenum decompressed. Proximal jejunal loops are normal. Small bowel the comes fluid-filled and dilated in the mid jejunum and continuing distally and the ileum is abnormal. Abnormal loops are dilated up to 42 mm diameter. And there is a small bowel feces sign  within the right lower quadrant on series 2, image 63. Abrupt transition point on image 64 where the downstream small bowel appears thickened but still fluid-filled. Indistinct thickening of that loop slowly tapers, and distal loops are decompressed although with a small volume of fluid to the terminal ileum. No free air. Small volume of free fluid only in the right lower quadrant. No twisting of the mesentery. Vascular/Lymphatic: Aortic tortuosity. Aortoiliac calcified atherosclerosis. Mildly ulcerated plaque of the distal thoracic aorta suspected on coronal image 33, stable. No overt abdominal aortic aneurysm. Major arterial structures remain patent. Portal venous system appears patent. No lymphadenopathy. Reproductive: Negative. Other: No pelvis free fluid. Musculoskeletal: Chronic severe lumbar  spine degeneration with underlying widespread lumbar posterior and interbody fusion sequelae. Levels of chronic lumbar decompression. Chronic right hip arthroplasty. No acute osseous abnormality identified. IMPRESSION: 1. Positive for Small Bowel Obstruction, with a small bowel feces sign and transition point in the right lower quadrant on series 2, image 64. Circumferential bowel wall thickening at the transition is nonspecific but raises the possibility of infectious/inflammatory etiology. Small volume of reactive appearing free fluid in the right lower quadrant. No free air. 2. Chronic bilateral renal atrophy and nephrolithiasis. 5 mm stone within the bladder at or near the left UVJ, but no secondary findings of acute obstructive uropathy. 3.  Aortic Atherosclerosis (ICD10-I70.0). Electronically Signed   By: Odessa Fleming M.D.   On: 06/09/2023 05:10   CT ABDOMEN PELVIS W CONTRAST Result Date: 05/22/2023 CLINICAL DATA:  Thoracic aortic aneurysm, epigastric and abdominal pain EXAM: CT ABDOMEN AND PELVIS WITH CONTRAST TECHNIQUE: Multidetector CT imaging of the abdomen and pelvis was performed using the standard protocol  following bolus administration of intravenous contrast. RADIATION DOSE REDUCTION: This exam was performed according to the departmental dose-optimization program which includes automated exposure control, adjustment of the mA and/or kV according to patient size and/or use of iterative reconstruction technique. CONTRAST:  80mL OMNIPAQUE IOHEXOL 300 MG/ML  SOLN COMPARISON:  05/04/2023 FINDINGS: Lower chest: No pleural or pericardial effusion. Minimal linear scarring or subsegmental atelectasis at the lung bases Hepatobiliary: No focal liver abnormality is seen. No gallstones, gallbladder wall thickening, or biliary dilatation. Pancreas: Unremarkable. No pancreatic ductal dilatation or surrounding inflammatory changes. Spleen: Normal in size without focal abnormality. Adrenals/Urinary Tract: No adrenal mass. Bilateral urolithiasis, larger stone on the right 3 cm upper pole, on the left 5 mm upper pole. No hydronephrosis. 2.2 cm 50 HU right upper pole cyst. Additional smaller bilateral cysts. Urinary bladder partially distended, with 6 mm calculus at the level of the right ureteral orifice, new since previous. Stomach/Bowel: Stomach is partially distended, without acute finding. Duodenum and proximal small bowel decompressed. Fluid distended mid and distal small bowel loops. The terminal ileum is nondilated. Post appendectomy. The colon is partially distended by fluid, without acute finding Vascular/Lymphatic: Mild scattered aortoiliac calcified plaque without aneurysm. Portal vein patent. No abdominal or pelvic adenopathy. Reproductive: Prostate enlargement with 3 metallic fiducial markers. Other: No ascites.  No free air. Musculoskeletal: Post instrumented fusion L2-S1., right L2 pedicle screw extending into the L1-2 interspace, with marked degenerative disc disease at this level, stable. Post right hip arthroplasty. No acute findings. IMPRESSION: 1. Fluid distended mid and distal small bowel loops, without definite  obstruction. 2. Bilateral urolithiasis, with 6 mm calculus at the level of the right ureteral orifice, new since previous. No hydronephrosis. Electronically Signed   By: Corlis Leak M.D.   On: 05/22/2023 16:11    Microbiology: Results for orders placed or performed during the hospital encounter of 05/04/23  Resp panel by RT-PCR (RSV, Flu A&B, Covid) Anterior Nasal Swab     Status: None   Collection Time: 05/04/23 10:42 PM   Specimen: Anterior Nasal Swab  Result Value Ref Range Status   SARS Coronavirus 2 by RT PCR NEGATIVE NEGATIVE Final    Comment: (NOTE) SARS-CoV-2 target nucleic acids are NOT DETECTED.  The SARS-CoV-2 RNA is generally detectable in upper respiratory specimens during the acute phase of infection. The lowest concentration of SARS-CoV-2 viral copies this assay can detect is 138 copies/mL. A negative result does not preclude SARS-Cov-2 infection and should not be used as the sole  basis for treatment or other patient management decisions. A negative result may occur with  improper specimen collection/handling, submission of specimen other than nasopharyngeal swab, presence of viral mutation(s) within the areas targeted by this assay, and inadequate number of viral copies(<138 copies/mL). A negative result must be combined with clinical observations, patient history, and epidemiological information. The expected result is Negative.  Fact Sheet for Patients:  BloggerCourse.com  Fact Sheet for Healthcare Providers:  SeriousBroker.it  This test is no t yet approved or cleared by the [place] FDA and  has been authorized for detection and/or diagnosis of SARS-CoV-2 by FDA under an Emergency Use Authorization (EUA). This EUA will remain  in effect (meaning this test can be used) for the duration of the COVID-19 declaration under Section 564(b)(1) of the Act, 21 U.S.C.section 360bbb-3(b)(1), unless the authorization is  terminated  or revoked sooner.       Influenza A by PCR NEGATIVE NEGATIVE Final   Influenza B by PCR NEGATIVE NEGATIVE Final    Comment: (NOTE) The Xpert Xpress SARS-CoV-2/FLU/RSV plus assay is intended as an aid in the diagnosis of influenza from Nasopharyngeal swab specimens and should not be used as a sole basis for treatment. Nasal washings and aspirates are unacceptable for Xpert Xpress SARS-CoV-2/FLU/RSV testing.  Fact Sheet for Patients: BloggerCourse.com  Fact Sheet for Healthcare Providers: SeriousBroker.it  This test is not yet approved or cleared by the [place] FDA and has been authorized for detection and/or diagnosis of SARS-CoV-2 by FDA under an Emergency Use Authorization (EUA). This EUA will remain in effect (meaning this test can be used) for the duration of the COVID-19 declaration under Section 564(b)(1) of the Act, 21 U.S.C. section 360bbb-3(b)(1), unless the authorization is terminated or revoked.     Resp Syncytial Virus by PCR NEGATIVE NEGATIVE Final    Comment: (NOTE) Fact Sheet for Patients: BloggerCourse.com  Fact Sheet for Healthcare Providers: SeriousBroker.it  This test is not yet approved or cleared by the [place] FDA and has been authorized for detection and/or diagnosis of SARS-CoV-2 by FDA under an Emergency Use Authorization (EUA). This EUA will remain in effect (meaning this test can be used) for the duration of the COVID-19 declaration under Section 564(b)(1) of the Act, 21 U.S.C. section 360bbb-3(b)(1), unless the authorization is terminated or revoked.  Performed at [facility], [address], [place], [place] [address]     Labs: CBC: Recent Labs  Lab 06/09/23 0244 06/10/23 0316 06/11/23 0324  WBC 8.9 7.4 5.1  NEUTROABS 7.1  --  3.6  HGB 15.1 14.3 13.8  HCT 42.4 42.3 40.4  MCV 90.2 94.8  94.2  PLT 198 181 175   Basic Metabolic Panel: Recent Labs  Lab 06/09/23 0244 06/09/23 1009 06/10/23 0316 06/11/23 0324  NA 141 140 142 137  K 3.5 4.1 3.8 3.8  CL 109 109 111 110  CO2 22 23 23 22   GLUCOSE 140* 153* 94 89  BUN 26* 26* 21 17  CREATININE 1.38* 1.29* 0.95 1.17  CALCIUM 9.0 8.6* 8.5* 8.5*  MG  --  1.7  --  1.9  PHOS  --  3.4  --  2.4*   Liver Function Tests: Recent Labs  Lab 06/09/23 0244 06/10/23 0316 06/11/23 0324  AST 12* 10* 11*  ALT 9 10 11   ALKPHOS 98 81 68  BILITOT 1.2 1.6* 1.8*  PROT 6.2* 5.7* 5.6*  ALBUMIN 4.1 3.5 3.1*   CBG: No results for input(s): "GLUCAP" in the last  168 hours.  Discharge time spent: greater than 30 minutes.  Signed: Marolyn Haller, MD [facility] 06/12/2023

## 2023-06-24 DIAGNOSIS — K219 Gastro-esophageal reflux disease without esophagitis: Secondary | ICD-10-CM | POA: Diagnosis not present

## 2023-07-16 ENCOUNTER — Other Ambulatory Visit: Payer: Self-pay | Admitting: Cardiology

## 2023-07-24 DIAGNOSIS — L812 Freckles: Secondary | ICD-10-CM | POA: Diagnosis not present

## 2023-07-24 DIAGNOSIS — L570 Actinic keratosis: Secondary | ICD-10-CM | POA: Diagnosis not present

## 2023-07-24 DIAGNOSIS — D485 Neoplasm of uncertain behavior of skin: Secondary | ICD-10-CM | POA: Diagnosis not present

## 2023-07-24 DIAGNOSIS — Z85820 Personal history of malignant melanoma of skin: Secondary | ICD-10-CM | POA: Diagnosis not present

## 2023-07-24 DIAGNOSIS — D225 Melanocytic nevi of trunk: Secondary | ICD-10-CM | POA: Diagnosis not present

## 2023-07-24 DIAGNOSIS — L905 Scar conditions and fibrosis of skin: Secondary | ICD-10-CM | POA: Diagnosis not present

## 2023-07-24 DIAGNOSIS — L821 Other seborrheic keratosis: Secondary | ICD-10-CM | POA: Diagnosis not present

## 2023-07-24 DIAGNOSIS — L814 Other melanin hyperpigmentation: Secondary | ICD-10-CM | POA: Diagnosis not present

## 2023-07-24 DIAGNOSIS — L853 Xerosis cutis: Secondary | ICD-10-CM | POA: Diagnosis not present

## 2023-08-13 DIAGNOSIS — Z1212 Encounter for screening for malignant neoplasm of rectum: Secondary | ICD-10-CM | POA: Diagnosis not present

## 2023-08-13 DIAGNOSIS — E785 Hyperlipidemia, unspecified: Secondary | ICD-10-CM | POA: Diagnosis not present

## 2023-08-13 DIAGNOSIS — E1151 Type 2 diabetes mellitus with diabetic peripheral angiopathy without gangrene: Secondary | ICD-10-CM | POA: Diagnosis not present

## 2023-08-13 DIAGNOSIS — C61 Malignant neoplasm of prostate: Secondary | ICD-10-CM | POA: Diagnosis not present

## 2023-08-13 DIAGNOSIS — I129 Hypertensive chronic kidney disease with stage 1 through stage 4 chronic kidney disease, or unspecified chronic kidney disease: Secondary | ICD-10-CM | POA: Diagnosis not present

## 2023-08-13 DIAGNOSIS — K219 Gastro-esophageal reflux disease without esophagitis: Secondary | ICD-10-CM | POA: Diagnosis not present

## 2023-08-13 DIAGNOSIS — N1831 Chronic kidney disease, stage 3a: Secondary | ICD-10-CM | POA: Diagnosis not present

## 2023-08-20 DIAGNOSIS — I739 Peripheral vascular disease, unspecified: Secondary | ICD-10-CM | POA: Diagnosis not present

## 2023-08-20 DIAGNOSIS — I2510 Atherosclerotic heart disease of native coronary artery without angina pectoris: Secondary | ICD-10-CM | POA: Diagnosis not present

## 2023-08-20 DIAGNOSIS — N1831 Chronic kidney disease, stage 3a: Secondary | ICD-10-CM | POA: Diagnosis not present

## 2023-08-20 DIAGNOSIS — I7121 Aneurysm of the ascending aorta, without rupture: Secondary | ICD-10-CM | POA: Diagnosis not present

## 2023-08-20 DIAGNOSIS — I10 Essential (primary) hypertension: Secondary | ICD-10-CM | POA: Diagnosis not present

## 2023-08-20 DIAGNOSIS — E785 Hyperlipidemia, unspecified: Secondary | ICD-10-CM | POA: Diagnosis not present

## 2023-08-20 DIAGNOSIS — E1151 Type 2 diabetes mellitus with diabetic peripheral angiopathy without gangrene: Secondary | ICD-10-CM | POA: Diagnosis not present

## 2023-08-20 DIAGNOSIS — I129 Hypertensive chronic kidney disease with stage 1 through stage 4 chronic kidney disease, or unspecified chronic kidney disease: Secondary | ICD-10-CM | POA: Diagnosis not present

## 2023-08-20 DIAGNOSIS — C61 Malignant neoplasm of prostate: Secondary | ICD-10-CM | POA: Diagnosis not present

## 2023-08-20 DIAGNOSIS — Z0000 Encounter for general adult medical examination without abnormal findings: Secondary | ICD-10-CM | POA: Diagnosis not present

## 2023-08-20 DIAGNOSIS — R82998 Other abnormal findings in urine: Secondary | ICD-10-CM | POA: Diagnosis not present

## 2023-08-30 DIAGNOSIS — C61 Malignant neoplasm of prostate: Secondary | ICD-10-CM | POA: Diagnosis not present

## 2023-09-02 DIAGNOSIS — M48062 Spinal stenosis, lumbar region with neurogenic claudication: Secondary | ICD-10-CM | POA: Diagnosis not present

## 2023-09-06 DIAGNOSIS — R3912 Poor urinary stream: Secondary | ICD-10-CM | POA: Diagnosis not present

## 2023-09-06 DIAGNOSIS — N401 Enlarged prostate with lower urinary tract symptoms: Secondary | ICD-10-CM | POA: Diagnosis not present

## 2023-09-06 DIAGNOSIS — C61 Malignant neoplasm of prostate: Secondary | ICD-10-CM | POA: Diagnosis not present

## 2023-09-25 DIAGNOSIS — M48062 Spinal stenosis, lumbar region with neurogenic claudication: Secondary | ICD-10-CM | POA: Diagnosis not present

## 2023-10-17 ENCOUNTER — Other Ambulatory Visit: Payer: Self-pay | Admitting: Cardiology

## 2023-12-01 ENCOUNTER — Emergency Department (HOSPITAL_COMMUNITY)
Admission: EM | Admit: 2023-12-01 | Discharge: 2023-12-01 | Disposition: A | Discharge status: Home / Self Care | Attending: Emergency Medicine | Admitting: Emergency Medicine

## 2023-12-01 ENCOUNTER — Encounter (HOSPITAL_COMMUNITY): Payer: Self-pay

## 2023-12-01 ENCOUNTER — Emergency Department (HOSPITAL_COMMUNITY)

## 2023-12-01 DIAGNOSIS — K56609 Unspecified intestinal obstruction, unspecified as to partial versus complete obstruction: Secondary | ICD-10-CM | POA: Diagnosis not present

## 2023-12-01 DIAGNOSIS — I2510 Atherosclerotic heart disease of native coronary artery without angina pectoris: Secondary | ICD-10-CM | POA: Diagnosis not present

## 2023-12-01 DIAGNOSIS — Z85528 Personal history of other malignant neoplasm of kidney: Secondary | ICD-10-CM | POA: Insufficient documentation

## 2023-12-01 DIAGNOSIS — K529 Noninfective gastroenteritis and colitis, unspecified: Secondary | ICD-10-CM | POA: Insufficient documentation

## 2023-12-01 DIAGNOSIS — K8689 Other specified diseases of pancreas: Secondary | ICD-10-CM | POA: Diagnosis not present

## 2023-12-01 DIAGNOSIS — Z8546 Personal history of malignant neoplasm of prostate: Secondary | ICD-10-CM | POA: Insufficient documentation

## 2023-12-01 DIAGNOSIS — Z87442 Personal history of urinary calculi: Secondary | ICD-10-CM | POA: Diagnosis not present

## 2023-12-01 DIAGNOSIS — Z85828 Personal history of other malignant neoplasm of skin: Secondary | ICD-10-CM | POA: Insufficient documentation

## 2023-12-01 DIAGNOSIS — N200 Calculus of kidney: Secondary | ICD-10-CM | POA: Diagnosis not present

## 2023-12-01 DIAGNOSIS — R1084 Generalized abdominal pain: Secondary | ICD-10-CM | POA: Diagnosis present

## 2023-12-01 DIAGNOSIS — I10 Essential (primary) hypertension: Secondary | ICD-10-CM | POA: Diagnosis not present

## 2023-12-01 LAB — URINALYSIS, ROUTINE W REFLEX MICROSCOPIC
Bilirubin Urine: NEGATIVE
Glucose, UA: NEGATIVE mg/dL
Hgb urine dipstick: NEGATIVE
Ketones, ur: NEGATIVE mg/dL
Leukocytes,Ua: NEGATIVE
Nitrite: NEGATIVE
Protein, ur: NEGATIVE mg/dL
Specific Gravity, Urine: 1.018 (ref 1.005–1.030)
pH: 7 (ref 5.0–8.0)

## 2023-12-01 LAB — CBC
HCT: 47.2 % (ref 39.0–52.0)
Hemoglobin: 16.3 g/dL (ref 13.0–17.0)
MCH: 31.3 pg (ref 26.0–34.0)
MCHC: 34.5 g/dL (ref 30.0–36.0)
MCV: 90.6 fL (ref 80.0–100.0)
Platelets: 183 K/uL (ref 150–400)
RBC: 5.21 MIL/uL (ref 4.22–5.81)
RDW: 12.1 % (ref 11.5–15.5)
WBC: 6.8 K/uL (ref 4.0–10.5)
nRBC: 0 % (ref 0.0–0.2)

## 2023-12-01 LAB — COMPREHENSIVE METABOLIC PANEL WITH GFR
ALT: 13 U/L (ref 0–44)
AST: 17 U/L (ref 15–41)
Albumin: 4.4 g/dL (ref 3.5–5.0)
Alkaline Phosphatase: 145 U/L — ABNORMAL HIGH (ref 38–126)
Anion gap: 14 (ref 5–15)
BUN: 25 mg/dL — ABNORMAL HIGH (ref 8–23)
CO2: 21 mmol/L — ABNORMAL LOW (ref 22–32)
Calcium: 10.2 mg/dL (ref 8.9–10.3)
Chloride: 105 mmol/L (ref 98–111)
Creatinine, Ser: 1.47 mg/dL — ABNORMAL HIGH (ref 0.61–1.24)
GFR, Estimated: 49 mL/min — ABNORMAL LOW (ref >60)
Glucose, Bld: 133 mg/dL — ABNORMAL HIGH (ref 70–99)
Potassium: 4.3 mmol/L (ref 3.5–5.1)
Sodium: 140 mmol/L (ref 135–145)
Total Bilirubin: 1.1 mg/dL (ref 0.0–1.2)
Total Protein: 6.7 g/dL (ref 6.5–8.1)

## 2023-12-01 LAB — LIPASE, BLOOD: Lipase: 23 U/L (ref 11–51)

## 2023-12-01 MED ORDER — ONDANSETRON HCL 4 MG/2ML IJ SOLN
4.0000 mg | Freq: Once | INTRAMUSCULAR | Status: AC
  Administered 2023-12-01: 4 mg via INTRAVENOUS
  Filled 2023-12-01: qty 2

## 2023-12-01 MED ORDER — SODIUM CHLORIDE 0.9 % IV BOLUS
500.0000 mL | Freq: Once | INTRAVENOUS | Status: AC
  Administered 2023-12-01: 500 mL via INTRAVENOUS

## 2023-12-01 MED ORDER — OXYCODONE HCL 5 MG PO TABS: 5.0000 mg | ORAL_TABLET | Freq: Four times a day (QID) | ORAL | 0 refills | Status: AC | PRN

## 2023-12-01 MED ORDER — IOHEXOL 300 MG/ML  SOLN
80.0000 mL | Freq: Once | INTRAMUSCULAR | Status: AC | PRN
  Administered 2023-12-01: 80 mL via INTRAVENOUS

## 2023-12-01 MED ORDER — ONDANSETRON HCL 4 MG PO TABS: 4.0000 mg | ORAL_TABLET | Freq: Three times a day (TID) | ORAL | 0 refills | Status: AC | PRN

## 2023-12-01 MED ORDER — MORPHINE SULFATE (PF) 4 MG/ML IV SOLN
4.0000 mg | Freq: Once | INTRAVENOUS | Status: AC
  Administered 2023-12-01: 4 mg via INTRAVENOUS
  Filled 2023-12-01: qty 1

## 2023-12-01 NOTE — ED Provider Notes (Signed)
 Emergency Department Provider Note   I have reviewed the triage vital signs and the nursing notes.   HISTORY  Chief Complaint Abdominal Pain   HPI Corey [name] is a 78 y.o. male with a past history reviewed below including small bowel obstruction managed medically earlier this year presents with abdominal distention and pain with nausea.  He continues to have small stool output but much less.  He is feeling increasingly uncomfortable over the past 24 hours.  Appetite is very poor.  No fevers.  No chest pain or shortness of breath.  Past Medical History:  Diagnosis Date   Arthritis 10/22/2011   hx. osteoarthritis-knees   Ascending aortic aneurysm (HCC)    Coronary artery disease    minimal per cath in 2011   Dyspnea    GERD (gastroesophageal reflux disease)    Heart murmur    History of kidney cancer    History of kidney stones    YRS AGO  nothing in last 10 yrs   Hypertension    Left bundle branch block    Prostate cancer (HCC)    Skin cancer     Review of Systems  Constitutional: No fever/chills Cardiovascular: Denies chest pain. Respiratory: Denies shortness of breath. Gastrointestinal: Positive abdominal pain. Positive nausea, no vomiting. Skin: Negative for rash. Neurological: Negative for headache.  ____________________________________________   PHYSICAL EXAM:  VITAL SIGNS: ED Triage Vitals  Encounter Vitals Group     BP 12/01/23 0527 (!) 160/94     Pulse Rate 12/01/23 0527 84     Resp 12/01/23 0527 17     Temp 12/01/23 0527 97.6 F (36.4 C)     Temp Source 12/01/23 0527 Oral     SpO2 12/01/23 0527 92 %   Constitutional: Alert and oriented. Well appearing and in no acute distress. Eyes: Conjunctivae are normal. Head: Atraumatic. Nose: No congestion/rhinnorhea. Mouth/Throat: Mucous membranes are moist.   Neck: No stridor.   Cardiovascular: Normal rate, regular rhythm. Good peripheral circulation. Grossly normal heart sounds.    Respiratory: Normal respiratory effort.  No retractions. Lungs CTAB. Gastrointestinal: Soft with mild diffuse tenderness. Positive distention.  Musculoskeletal: No gross deformities of extremities. Neurologic:  Normal speech and language.  Skin:  Skin is warm, dry and intact. No rash noted.  ____________________________________________   LABS (all labs ordered are listed, but only abnormal results are displayed)  Labs Reviewed  COMPREHENSIVE METABOLIC PANEL WITH GFR - Abnormal; Notable for the following components:      Result Value   CO2 21 (*)    Glucose, Bld 133 (*)    BUN 25 (*)    Creatinine, Ser 1.47 (*)    Alkaline Phosphatase 145 (*)    GFR, Estimated 49 (*)    All other components within normal limits  LIPASE, BLOOD  CBC  URINALYSIS, ROUTINE W REFLEX MICROSCOPIC   ____________________________________________   PROCEDURES  Procedure(s) performed:   Procedures  None  ____________________________________________   INITIAL IMPRESSION / ASSESSMENT AND PLAN / ED COURSE  Pertinent labs & imaging results that were available during my care of the patient were reviewed by me and considered in my medical decision making (see chart for details).   This patient is Presenting for Evaluation of abdominal pain, which does require a range of treatment options, and is a complaint that involves a high risk of morbidity and mortality.  The Differential Diagnoses includes but is not exclusive to acute cholecystitis, intrathoracic causes for epigastric abdominal pain, gastritis, duodenitis, pancreatitis,  small bowel or large bowel obstruction, abdominal aortic aneurysm, hernia, gastritis, etc.   Critical Interventions-    Medications  sodium chloride  0.9 % bolus 500 mL (0 mLs Intravenous Stopped 12/01/23 0927)  morphine  (PF) 4 MG/ML injection 4 mg (4 mg Intravenous Given 12/01/23 0616)  ondansetron  (ZOFRAN ) injection 4 mg (4 mg Intravenous Given 12/01/23 0617)  iohexol   (OMNIPAQUE ) 300 MG/ML solution 80 mL (80 mLs Intravenous Contrast Given 12/01/23 0716)  morphine  (PF) 4 MG/ML injection 4 mg (4 mg Intravenous Given 12/01/23 0804)    Reassessment after intervention:  pain improved.    I did obtain Additional Historical Information from family at bedside.    Clinical Laboratory Tests Ordered, included CBC without leukocytosis. Remaining labs pending.   Radiologic Tests Ordered, included CT abdomen/pelvis. I independently interpreted the images and agree with radiology interpretation.   Cardiac Monitor Tracing which shows NSR.    Social Determinants of Health Risk patient is a non-smoker.   Medical Decision Making: Summary:  The patient presents emergency department with abdominal distention and discomfort with nausea.  History of bowel obstruction earlier this year.  Patient will require CT imaging to fully evaluate for possibility of bowel obstruction.  Very well-appearing.  Stable vitals.  Reevaluation with update and discussion with patient. Care transferred to [facility].   Patient's presentation is most consistent with acute presentation with potential threat to life or bodily function.   Disposition: pending  ____________________________________________  FINAL CLINICAL IMPRESSION(S) / ED DIAGNOSES  Final diagnoses:  Enteritis     NEW OUTPATIENT MEDICATIONS STARTED DURING THIS VISIT:  Discharge Medication List as of 12/01/2023  9:55 AM     START taking these medications   Details  ondansetron  (ZOFRAN ) 4 MG tablet Take 1 tablet (4 mg total) by mouth every 8 (eight) hours as needed for nausea or vomiting., Starting Sun 12/01/2023, Normal    oxyCODONE  (ROXICODONE ) 5 MG immediate release tablet Take 1 tablet (5 mg total) by mouth every 6 (six) hours as needed for severe pain (pain score 7-10)., Starting Sun 12/01/2023, Normal        Note:  This document was prepared using Dragon voice recognition software and may include unintentional  dictation errors.  Fonda Law, MD, [facility] Emergency Medicine    Shaton Lore, Fonda MATSU, MD 12/01/23 [phone]

## 2023-12-01 NOTE — ED Provider Notes (Signed)
  ED Course / MDM   Clinical Course as of 12/01/23 0946  [name] Dec 01, 2023  0707 Received sign out from Dr. Darra presenting with abd pain, pending CT scan. Hx SBO [WS]  0926 CT scan shows enteritis.  He reports his symptoms are much improved.  Will p.o. trial.  If patient is able to tolerate this, anticipate discharge to home.  He feels comfortable with this.  He does have a GI doctor he can follow-up with. [WS]  [phone] Patient able to tolerate p.o. and still feels well with no nausea.  I prescribed medication for nausea medication.  Recommended follow-up with outpatient physicians. Will discharge patient to home. All questions answered. Patient comfortable with plan of discharge. Return precautions discussed with patient and specified on the after visit summary.  [WS]    Clinical Course User Index [WS] Francesca Elsie CROME, MD   Medical Decision Making Amount and/or Complexity of Data Reviewed Labs: ordered. Radiology: ordered.  Risk Prescription drug management.      Francesca Elsie CROME, MD 12/01/23 [phone]

## 2023-12-01 NOTE — ED Notes (Signed)
 Patient was able to eat his breakfast with no nausea or vomitng    that was sent to Dr Francesca

## 2023-12-01 NOTE — ED Triage Notes (Signed)
 Pt complaints of abdominal discomfort that started a week ago, worsen over the past few days, becoming painful 2300.   Pain 9 out of 10, distended more than usual, bowel sounds present of 4th quadrants. Last BM 2 hours ago smaller than usual, with some loose stools earlier today.   Hx of partial and total bowel obstructions. Alert and oriented at triage.

## 2023-12-01 NOTE — Discharge Instructions (Signed)
 We evaluated you for your abdominal pain and nausea.  Your CT scan did not show any blockages.  Your CT scan did show signs of enteritis, a mild type of infection in your intestines.  This is usually caused by a virus.  We believe that it is safe to go home.  We have prescribed you nausea medicine.  Please take 1000 mg of Tylenol  every 6 hours as needed for pain.  We have given you a small amount of oxycodone  which you can take for pain not relieved by Tylenol .  Do not drink alcohol , drive, or operate machinery when taking this medicine.  Please follow-up closely with your primary doctor and your gastroenterologist.  Please return if you develop any new or worsening symptoms.

## 2023-12-18 DIAGNOSIS — H2513 Age-related nuclear cataract, bilateral: Secondary | ICD-10-CM | POA: Diagnosis not present

## 2023-12-18 DIAGNOSIS — H5213 Myopia, bilateral: Secondary | ICD-10-CM | POA: Diagnosis not present

## 2023-12-19 DIAGNOSIS — R399 Unspecified symptoms and signs involving the genitourinary system: Secondary | ICD-10-CM | POA: Diagnosis not present

## 2023-12-20 DIAGNOSIS — N209 Urinary calculus, unspecified: Secondary | ICD-10-CM | POA: Diagnosis not present

## 2023-12-20 DIAGNOSIS — R310 Gross hematuria: Secondary | ICD-10-CM | POA: Diagnosis not present

## 2023-12-20 DIAGNOSIS — N200 Calculus of kidney: Secondary | ICD-10-CM | POA: Diagnosis not present

## 2023-12-20 DIAGNOSIS — Z8546 Personal history of malignant neoplasm of prostate: Secondary | ICD-10-CM | POA: Diagnosis not present

## 2024-01-01 DIAGNOSIS — M7542 Impingement syndrome of left shoulder: Secondary | ICD-10-CM | POA: Diagnosis not present

## 2024-01-01 DIAGNOSIS — M25512 Pain in left shoulder: Secondary | ICD-10-CM | POA: Diagnosis not present

## 2024-01-01 DIAGNOSIS — S46012A Strain of muscle(s) and tendon(s) of the rotator cuff of left shoulder, initial encounter: Secondary | ICD-10-CM | POA: Diagnosis not present

## 2024-01-02 DIAGNOSIS — R310 Gross hematuria: Secondary | ICD-10-CM | POA: Diagnosis not present

## 2024-01-02 DIAGNOSIS — R399 Unspecified symptoms and signs involving the genitourinary system: Secondary | ICD-10-CM | POA: Diagnosis not present

## 2024-01-09 ENCOUNTER — Other Ambulatory Visit: Payer: Self-pay | Admitting: Cardiology

## 2024-01-09 DIAGNOSIS — H25811 Combined forms of age-related cataract, right eye: Secondary | ICD-10-CM | POA: Diagnosis not present

## 2024-01-09 DIAGNOSIS — H2511 Age-related nuclear cataract, right eye: Secondary | ICD-10-CM | POA: Diagnosis not present

## 2024-01-09 DIAGNOSIS — H2181 Floppy iris syndrome: Secondary | ICD-10-CM | POA: Diagnosis not present

## 2024-01-09 DIAGNOSIS — Z961 Presence of intraocular lens: Secondary | ICD-10-CM | POA: Diagnosis not present

## 2024-01-14 DIAGNOSIS — M7542 Impingement syndrome of left shoulder: Secondary | ICD-10-CM | POA: Diagnosis not present

## 2024-01-14 DIAGNOSIS — S46012D Strain of muscle(s) and tendon(s) of the rotator cuff of left shoulder, subsequent encounter: Secondary | ICD-10-CM | POA: Diagnosis not present

## 2024-01-14 DIAGNOSIS — S46112D Strain of muscle, fascia and tendon of long head of biceps, left arm, subsequent encounter: Secondary | ICD-10-CM | POA: Diagnosis not present

## 2024-01-29 DIAGNOSIS — M48062 Spinal stenosis, lumbar region with neurogenic claudication: Secondary | ICD-10-CM | POA: Diagnosis not present

## 2024-01-29 DIAGNOSIS — Z6826 Body mass index (BMI) 26.0-26.9, adult: Secondary | ICD-10-CM | POA: Diagnosis not present

## 2024-01-30 DIAGNOSIS — H2512 Age-related nuclear cataract, left eye: Secondary | ICD-10-CM | POA: Diagnosis not present

## 2024-01-30 DIAGNOSIS — Z961 Presence of intraocular lens: Secondary | ICD-10-CM | POA: Diagnosis not present

## 2024-01-30 DIAGNOSIS — H25812 Combined forms of age-related cataract, left eye: Secondary | ICD-10-CM | POA: Diagnosis not present

## 2024-01-30 DIAGNOSIS — T887XXA Unspecified adverse effect of drug or medicament, initial encounter: Secondary | ICD-10-CM | POA: Diagnosis not present

## 2024-01-30 DIAGNOSIS — H21562 Pupillary abnormality, left eye: Secondary | ICD-10-CM | POA: Diagnosis not present

## 2024-01-30 DIAGNOSIS — H2181 Floppy iris syndrome: Secondary | ICD-10-CM | POA: Diagnosis not present

## 2024-01-31 DIAGNOSIS — R310 Gross hematuria: Secondary | ICD-10-CM | POA: Diagnosis not present

## 2024-02-04 DIAGNOSIS — M7542 Impingement syndrome of left shoulder: Secondary | ICD-10-CM | POA: Diagnosis not present

## 2024-02-04 DIAGNOSIS — S46112D Strain of muscle, fascia and tendon of long head of biceps, left arm, subsequent encounter: Secondary | ICD-10-CM | POA: Diagnosis not present

## 2024-02-04 DIAGNOSIS — S46012D Strain of muscle(s) and tendon(s) of the rotator cuff of left shoulder, subsequent encounter: Secondary | ICD-10-CM | POA: Diagnosis not present

## 2024-02-05 DIAGNOSIS — K219 Gastro-esophageal reflux disease without esophagitis: Secondary | ICD-10-CM | POA: Diagnosis not present

## 2024-02-11 DIAGNOSIS — M48062 Spinal stenosis, lumbar region with neurogenic claudication: Secondary | ICD-10-CM | POA: Diagnosis not present

## 2024-02-21 DIAGNOSIS — M25512 Pain in left shoulder: Secondary | ICD-10-CM | POA: Diagnosis not present

## 2024-03-05 ENCOUNTER — Telehealth (HOSPITAL_BASED_OUTPATIENT_CLINIC_OR_DEPARTMENT_OTHER): Payer: Self-pay | Admitting: *Deleted

## 2024-03-05 NOTE — Telephone Encounter (Signed)
° °  Pre-operative Risk Assessment    Patient Name: Corey [name]  DOB: 04-May-1945 MRN: [id number]   Date of last office visit: 10/16/22 DR. JORDAN Date of next office visit: NOND   Request for Surgical Clearance    Procedure:  LEFT SHOULDER SCOPE  Date of Surgery:  Clearance TBD                                Surgeon:  DR. EVALENE CHANCY Surgeon's Group or Practice Name:  [facility] Phone number:  [phone] KELLY HIGH Fax number:  [phone]    Type of Clearance Requested:   - Medical    Type of Anesthesia:  General    Additional requests/questions:    Corey [name]   03/05/2024, 12:20 PM

## 2024-03-06 NOTE — Telephone Encounter (Signed)
 OV preop clearance appt has now been scheduled. Pt requested next earliest availability, provided him with avail at [facility] and [facility] which were too far out for his preference. Pt states he's willing to go to [place]. Provided him with the next avail and agrees to that date and time

## 2024-03-06 NOTE — Telephone Encounter (Signed)
" ° °  Name: DASHUN BORRE  DOB: Mar 02, 1946  MRN: [id number]  Primary Cardiologist: Peter Jordan, MD  Chart reviewed as part of pre-operative protocol coverage. Because of Cutberto Winfree Vanderwerf's past medical history and time since last visit, he will require a follow-up in-office visit in order to better assess preoperative cardiovascular risk.  Pre-op covering staff: - Please schedule appointment and call patient to inform them. If patient already had an upcoming appointment within acceptable timeframe, please add pre-op clearance to the appointment notes so provider is aware. - Please contact requesting surgeon's office via preferred method (i.e, phone, fax) to inform them of need for appointment prior to surgery.  He is not on anticoagulation.  Saddie GORMAN Cleaves, NP  03/06/2024, 8:24 AM   "

## 2024-03-06 NOTE — Telephone Encounter (Signed)
 error

## 2024-03-09 ENCOUNTER — Other Ambulatory Visit: Payer: Self-pay | Admitting: Cardiology

## 2024-03-17 NOTE — Progress Notes (Unsigned)
 " Cardiology Office Note   Date:  03/18/2024  ID:  DAEGAN ARIZMENDI, DOB 1945-11-18, MRN [id number] PCP: Yolande Toribio MATSU, MD  [facility] Providers Cardiologist:  Peter Jordan, MD   History of Present Illness Corey [name] is a 78 y.o. male with a h/o thoracic aortic aneurysm, CKD stage 3, HTN, HLD, chronic left bundle branch block, and minimal CAD by cath in 2011 and again in 2024 who is being seen for preoperative cardiovascular exam.  Patient was admitted in July 2024 for chest pain.  Troponin was normal.  Echo showed normal pump function.  Cardiac cath showed minimal CAD.  His last chest CT in March 2021 showed no change in the thoracic dimension of 4.3 cm.  He was last seen 10/16/2022 reporting chest pain resolved with reflux therapy and eating better.  Today, the patient is needing left shoulder surgery. He says he will have a nerve block. He does not have a date for procedure yet. He has been stable from a heart perspective. He goes to the [facility] almost daily. He walks or uses the treadmill. He lost about 25lbs with diet and exercise. No h/o diabetes or stroke. He can walk 1-2 blocks and can walk up a flight of stairs. He denies chest pain, SOB, lower leg edema, orthopnea, lightheadedness, dizziness, pnd, heart racing.   Studies Reviewed EKG Interpretation Date/Time:  Wednesday March 18 2024 09:20:14 EST Ventricular Rate:  99 PR Interval:  178 QRS Duration:  158 QT Interval:  392 QTC Calculation: 503 R Axis:   -37  Text Interpretation: Normal sinus rhythm Left axis deviation Left bundle branch block When compared with ECG of 09-Jun-2023 02:49, PREVIOUS ECG IS PRESENT Confirmed by Franchester, Isair Inabinet ([id number]) on 03/18/2024 9:23:01 AM    [facility]HC 09/2022    Prox RCA-1 lesion is 30% stenosed.   Prox RCA-2 lesion is 30% stenosed.   LV end diastolic pressure is normal.   Nonobstructive CAD Normal LVEDP   Plan: consider noncardiac causes of chest pain. Anticipate DC  today   Echo 09/2022 1. Left ventricular ejection fraction, by estimation, is 50 to 55%. The  left ventricle has low normal function. The left ventricle has no regional  wall motion abnormalities. Left ventricular diastolic parameters are  consistent with Grade I diastolic  dysfunction (impaired relaxation). The average left ventricular global  longitudinal strain is -20.1 %. The global longitudinal strain is normal.   2. Right ventricular systolic function is normal. The right ventricular  size is normal. There is normal pulmonary artery systolic pressure. The  estimated right ventricular systolic pressure is 22.9 mmHg.   3. The mitral valve is normal in structure. Mild mitral valve  regurgitation. No evidence of mitral stenosis.   4. The aortic valve is tricuspid. Aortic valve regurgitation is trivial.  No aortic stenosis is present.   5. The inferior vena cava is normal in size with greater than 50%  respiratory variability, suggesting right atrial pressure of 3 mmHg.      Physical Exam VS:  BP 122/60 (BP Location: Left Arm, Patient Position: Sitting, Cuff Size: Large)   Pulse 99   Ht 5' 11 (1.803 m)   Wt 190 lb 12.8 oz (86.5 kg)   SpO2 96%   BMI 26.61 kg/m        Wt Readings from Last 3 Encounters:  03/18/24 190 lb 12.8 oz (86.5 kg)  06/09/23 185 lb 3 oz (84 kg)  05/22/23 198 lb 6.6 oz (90 kg)  GEN: Well nourished, well developed in no acute distress NECK: No JVD; No carotid bruits CARDIAC: RRR, no murmurs, rubs, gallops RESPIRATORY:  Clear to auscultation without rales, wheezing or rhonchi  ABDOMEN: Soft, non-tender, non-distended EXTREMITIES:  No edema; No deformity   ASSESSMENT AND PLAN  Pre-operative cardiac evaluation for L shoulder surgery Minimal CAD LBBB HTN HLD Aortic aneurysm CKD stage 3 The patient is overall stable from a cardiac perspective. He denies chest pain, SOB, lower leg edema, lightheadedness, dizziness, palpitations, syncope. He exercises  almost daily walking 1 to 2 miles.  Thoracic aortic aneurysm has been stable for many years. Dr. Gib most recent recommendation was to recheck in 2025, so this will be ordered.  Kidney function relatively stable, he follows with nephrology.  EKG shows normal sinus rhythm with chronic left bundle branch block.  We will continue current cardiac medications.  No further cardiac workup prior to surgery recommended. METs greater than 4 RCRI= 0.5% risk of MACE     Dispo: follow-up in 1 year  Signed, Alastor Kneale VEAR Fishman, PA-C   "

## 2024-03-18 ENCOUNTER — Telehealth (HOSPITAL_BASED_OUTPATIENT_CLINIC_OR_DEPARTMENT_OTHER): Payer: Self-pay

## 2024-03-18 ENCOUNTER — Ambulatory Visit: Attending: Medical | Admitting: Medical

## 2024-03-18 ENCOUNTER — Ambulatory Visit: Admitting: Medical

## 2024-03-18 ENCOUNTER — Encounter: Payer: Self-pay | Admitting: Medical

## 2024-03-18 VITALS — BP 122/60 | HR 99 | Ht 71.0 in | Wt 190.8 lb

## 2024-03-18 DIAGNOSIS — N183 Chronic kidney disease, stage 3 unspecified: Secondary | ICD-10-CM

## 2024-03-18 DIAGNOSIS — I10 Essential (primary) hypertension: Secondary | ICD-10-CM

## 2024-03-18 DIAGNOSIS — E782 Mixed hyperlipidemia: Secondary | ICD-10-CM | POA: Diagnosis not present

## 2024-03-18 DIAGNOSIS — I447 Left bundle-branch block, unspecified: Secondary | ICD-10-CM | POA: Diagnosis not present

## 2024-03-18 DIAGNOSIS — Z01810 Encounter for preprocedural cardiovascular examination: Secondary | ICD-10-CM | POA: Diagnosis not present

## 2024-03-18 DIAGNOSIS — I712 Thoracic aortic aneurysm, without rupture, unspecified: Secondary | ICD-10-CM | POA: Diagnosis not present

## 2024-03-18 NOTE — Patient Instructions (Addendum)
 Medication Instructions:   Your physician recommends that you continue on your current medications as directed. Please refer to the Current Medication list given to you today.    *If you need a refill on your cardiac medications before your next appointment, please call your pharmacy*  Lab Work:  None ordered at this time   If you have labs (blood work) drawn today and your tests are completely normal, you will receive your results only by:  MyChart Message (if you have MyChart) OR  A paper copy in the mail If you have any lab test that is abnormal or we need to change your treatment, we will call you to review the results.  Testing/Procedures:  CT Angiography (CTA) chest/aorta, is a special type of CT scan that uses a computer to produce multi-dimensional views of major blood vessels throughout the body. In CT angiography, a contrast material is injected through an IV to help visualize the blood vessels  Nothing to eat or drink 4 hours prior to test  [facility] [address]  [place], [place] [address] None ordered at this time   Referrals:  None ordered at this time   Follow-Up:  At [facility], you and your health needs are our priority.  As part of our continuing mission to provide you with exceptional heart care, our providers are all part of one team.  This team includes your primary Cardiologist (physician) and Advanced Practice Providers or APPs (Physician Assistants and Nurse Practitioners) who all work together to provide you with the care you need, when you need it.  Your next appointment:   1 year(s)  Provider:    You may see Peter Jordan, MD or one of the following Advanced Practice Providers on your designated Care Team:   Lonni Meager, NP Lesley Maffucci, PA-C Bernardino Bring, PA-C Cadence Ellport, PA-C Tylene Lunch, NP Barnie Hila, NP    We recommend signing up for the patient portal called MyChart.   Sign up information is provided on this After Visit Summary.  MyChart is used to connect with patients for Virtual Visits (Telemedicine).  Patients are able to view lab/test results, encounter notes, upcoming appointments, etc.  Non-urgent messages can be sent to your provider as well.   To learn more about what you can do with MyChart, go to [email or web address].

## 2024-03-18 NOTE — Telephone Encounter (Signed)
"  ° °  Pre-operative Risk Assessment    Patient Name: Corey [name]  DOB: 1945-09-27 MRN: [id number]   Date of last office visit: 03/18/24 with Corey [name]  Date of next office visit: NA  Request for Surgical Clearance    Procedure:  Left Shoulder Scope  Date of Surgery:  Clearance TBD                                 Surgeon:  Dr. Beverley [name] Group or Practice Name:  [facility] Phone number:  [phone] Fax number:  [phone]   Type of Clearance Requested:   - Medical    Type of Anesthesia:  General    Additional requests/questions:    SignedAugustin JONETTA Daring   03/18/2024, 11:19 AM   "

## 2024-03-18 NOTE — Telephone Encounter (Signed)
"  ° °  Patient Name: Corey [name]  DOB: 03-12-1946 MRN: [id number]  Primary Cardiologist: Peter Jordan, MD  Chart reviewed as part of pre-operative protocol coverage. Given past medical history and time since last visit, based on ACC/AHA guidelines, Corey [name] is at acceptable risk for the planned procedure without further cardiovascular testing.   Per [name], NP 03/08/2024 EKG shows normal sinus rhythm with chronic left bundle branch block.  We will continue current cardiac medications.  No further cardiac workup prior to surgery recommended. METs greater than 4 RCRI= 0.5% risk of MACE  The patient was advised that if he develops new symptoms prior to surgery to contact our office to arrange for a follow-up visit, and he verbalized understanding.  I will route this recommendation to the requesting party via Epic fax function and remove from pre-op pool.  Please call with questions.  Lamarr Satterfield, NP 03/18/2024, 11:34 AM  "

## 2024-03-19 LAB — BASIC METABOLIC PANEL WITH GFR
BUN/Creatinine Ratio: 13 (ref 10–24)
BUN: 22 mg/dL (ref 8–27)
CO2: 22 mmol/L (ref 20–29)
Calcium: 9.6 mg/dL (ref 8.6–10.2)
Chloride: 108 mmol/L — ABNORMAL HIGH (ref 96–106)
Creatinine, Ser: 1.73 mg/dL — ABNORMAL HIGH (ref 0.76–1.27)
Glucose: 97 mg/dL (ref 70–99)
Potassium: 5 mmol/L (ref 3.5–5.2)
Sodium: 143 mmol/L (ref 134–144)
eGFR: 40 mL/min/1.73 — ABNORMAL LOW

## 2024-03-23 ENCOUNTER — Ambulatory Visit: Payer: Self-pay | Admitting: Medical

## 2024-03-23 DIAGNOSIS — I7122 Aneurysm of the aortic arch, without rupture: Secondary | ICD-10-CM

## 2024-03-25 ENCOUNTER — Ambulatory Visit
Admission: RE | Admit: 2024-03-25 | Discharge: 2024-03-25 | Disposition: A | Discharge status: Home / Self Care | Source: Ambulatory Visit | Attending: Medical | Admitting: Medical

## 2024-03-25 DIAGNOSIS — I712 Thoracic aortic aneurysm, without rupture, unspecified: Secondary | ICD-10-CM | POA: Diagnosis present

## 2024-03-25 MED ORDER — IOHEXOL 350 MG/ML SOLN
60.0000 mL | Freq: Once | INTRAVENOUS | Status: AC | PRN
  Administered 2024-03-25: 60 mL via INTRAVENOUS

## 2024-03-26 ENCOUNTER — Ambulatory Visit: Admitting: Student

## 2024-03-31 NOTE — Telephone Encounter (Signed)
 Patient was returning call. Please advise ?

## 2024-04-01 NOTE — Telephone Encounter (Signed)
 Attempted to call pt twice.  Call would not go through stating Call cannot be completed at this time, try your call again later.

## 2024-04-02 NOTE — Addendum Note (Signed)
 Addended by: DESIDERIO RUSSELL SAILOR on: 04/02/2024 10:11 AM   Modules accepted: Orders

## 2024-04-05 ENCOUNTER — Other Ambulatory Visit: Payer: Self-pay | Admitting: Cardiology

## 2024-04-08 NOTE — Telephone Encounter (Signed)
 Lab on 03/18/24 outside of Normal  In accordance with refill protocols, please review and address the following requirements before this medication refill can be authorized:  Labs

## 2024-04-09 ENCOUNTER — Ambulatory Visit

## 2024-04-09 VITALS — BP 119/77 | HR 104 | Resp 18 | Ht 71.0 in | Wt 185.0 lb

## 2024-04-09 DIAGNOSIS — I7121 Aneurysm of the ascending aorta, without rupture: Secondary | ICD-10-CM

## 2024-04-09 NOTE — Patient Instructions (Addendum)

## 2024-04-09 NOTE — Progress Notes (Signed)
 "      [address] [place] [address]             [phone]            Corey [name] [id number] Nov 15, 1945   History of Present Illness:  Corey [name] is a 79 year old man with medical history of hypertension, left bundle branch block, coronary artery disease, GERD, osteoarthritis, lumbar spinal stenosis, and hyperlipidemia who presents for initial encounter of ascending thoracic aortic aneurysm.  This has been followed by his cardiologist previously.  Aneurysm was found in 2011 and has measured at 4.3 cm for many years.  On recent CTA of chest on 03/25/2024 ascending aorta measured 4.5 cm. Echocardiogram on 09/2022 showed that the aortic valve is tricuspid.  There was trivial aortic valve regurgitation and no aortic stenosis.   He presents to the clinic and reports that he is doing well.  His blood pressure is well controlled with current medications. He does have a home blood pressure cuff but finds that his home readings are higher than office readings. He is active with walking and biking.  He does lift weights and denies heavy lifting.  He is planning on undergoing surgery to help with pain in his biceps tendon and needs cardiac clearance. He denies chest pain, shortness of breath and lower leg edema.    Medications Ordered Prior to Encounter[1]   ROS: Review of Systems  Constitutional:  Negative for fever and malaise/fatigue.  Respiratory:  Negative for cough, shortness of breath and wheezing.   Cardiovascular:  Negative for chest pain, palpitations and leg swelling.     BP 119/77   Pulse (!) 104   Resp 18   Ht 5' 11 (1.803 m)   Wt 185 lb (83.9 kg)   SpO2 95% Comment: RA  BMI 25.80 kg/m   Physical Exam Constitutional:      Appearance: Normal appearance.  HENT:     Head: Normocephalic and atraumatic.  Skin:    General: Skin is warm and dry.  Neurological:     General: No focal deficit present.     Mental Status: He is alert and  oriented to person, place, and time.      Imaging: CLINICAL DATA:  Thoracic aortic aneurysm (TAA), follow up   EXAM: CT ANGIOGRAPHY CHEST WITH CONTRAST   TECHNIQUE: Multidetector CT imaging of the chest was performed using the standard protocol during bolus administration of intravenous contrast. Multiplanar CT image reconstructions and MIPs were obtained to evaluate the vascular anatomy.   RADIATION DOSE REDUCTION: This exam was performed according to the departmental dose-optimization program which includes automated exposure control, adjustment of the mA and/or kV according to patient size and/or use of iterative reconstruction technique.   CONTRAST:  60mL OMNIPAQUE  IOHEXOL  350 MG/ML SOLN   COMPARISON:  Chest XR, 09/24/2022. CT chest, 05/20/2019. CT AP, 12/01/2023.   FINDINGS: CARDIOVASCULAR:   Limitations by motion: Moderate   Preferential opacification of the thoracic aorta. No evidence of thoracic aortic dissection.   Aortic Root: Motion degraded, such that the aortic root can not be accurately measured   Thoracic Aorta:   --Ascending Aorta: 4.5 cm   --Aortic Arch: 3.1 cm   --Descending Aorta: 2.7 cm   Other:   Normal heart size. No pericardial effusion. Mild burden of multivessel coronary atherosclerosis.   Conventional 3-sided LEFT aortic arch. No atherosclerosis or hemodynamic stenosis at the origins of the great vessels.  Dilated main PA, measuring 3.5 cm   Mediastinum/Nodes: No enlarged mediastinal, hilar, or axillary lymph nodes. 1.2 cm thyroid  nodule without discrete follow-up indicated. Trachea, and esophagus demonstrate no significant findings.   Lungs/Pleura: Lungs are clear without focal consolidation, mass or suspicious pulmonary nodule. No pleural effusion or pneumothorax.   Upper Abdomen: No acute abnormality. 1 cm ovoid enhancing focus within the superior LEFT hepatic lobe without discrete mass lesion.   Musculoskeletal: No chest  wall abnormality. No acute or significant osseous findings.   Review of the MIP images confirms the above findings.   IMPRESSION: 1. 4.5 cm fusiform aneurysmal dilatation of the ascending thoracic aorta. Recommend semi-annual imaging followup by CTA or MRA and referral to cardiothoracic surgery if not already obtained. This recommendation follows 2010 ACCF/AHA/AATS/ACR/ASA/SCA/SCAI/SIR/STS/SVM Guidelines for the Diagnosis and Management of Patients With Thoracic Aortic Disease. Circulation. 2010; 121: Z733-z630. Aortic aneurysm NOS (ICD10-I71.9) 2. 1 cm enhancing focus at the LEFT hepatic lobe without discrete mass, likely a small hemangioma versus THAD. No follow-up is indicated. 3. Coronary atherosclerosis. Additional incidental, chronic and senescent findings as above.     Electronically Signed   By: Thom Hall M.D.   On: 03/25/2024 16:15     A/P: Aneurysm of ascending aorta without rupture -4.5 cm ascending thoracic aortic aneurysm on CTA of chest. Echocardiogram showed that the aortic valve is tricuspid.  -We discussed the natural history and and risk factors for growth of ascending aortic aneurysms. Discussed recommendations to minimize the risk of further expansion or dissection including careful blood pressure control, avoidance of contact sports and heavy lifting, attention to lipid management.  We covered the importance of staying never user of tobacco.  The patient does not yet meet surgical criteria of >5.5cm. The patient is aware of signs and symptoms of aortic dissection and when to present to the emergency department  -Discussed that he is cleared for surgery from an aneurysmal standpoint but needs to have good control of blood pressure during and after the procedure.  -Follow up in one year with CTA of chest for continued surveillance    Risk Modification:  Statin:  pravastatin   Smoking cessation instruction/counseling given:  never user  Patient was  counseled on importance of Blood Pressure Control  They are instructed to contact their Primary Care Physician if they start to have blood pressure readings over 130s/90s. Do not ever stop blood pressure medications on your own, unless instructed by healthcare professional.  Please avoid use of Fluoroquinolones as this can potentially increase your risk of Aortic Rupture and/or Dissection  Patient educated on signs and symptoms of Aortic Dissection, handout also provided in AVS  Manuelita CHRISTELLA Rough, PA-C 04/09/24      [1]  Current Outpatient Medications on File Prior to Visit  Medication Sig Dispense Refill   ALPRAZolam  (XANAX ) 0.25 MG tablet Take 0.25 mg by mouth at bedtime.     amLODipine  (NORVASC ) 5 MG tablet TAKE 1 TABLET BY MOUTH EVERY DAY (Patient taking differently: Take 5 mg by mouth daily.) 90 tablet 2   fexofenadine (ALLEGRA) 180 MG tablet Take 180 mg by mouth daily.     losartan  (COZAAR ) 100 MG tablet Take 1 tablet (100 mg total) by mouth daily. 90 tablet 3   Multiple Vitamins-Minerals (CENTRUM SILVER 50+MEN) TABS Take 1 tablet by mouth daily.     pantoprazole  (PROTONIX ) 40 MG tablet Take 1 tablet (40 mg total) by mouth daily.     potassium citrate  (UROCIT-K ) 10 MEQ (1080 MG) SR  tablet Take 20 mEq by mouth 2 (two) times daily.     pravastatin  (PRAVACHOL ) 40 MG tablet Take 40 mg by mouth at bedtime.   3   tamsulosin  (FLOMAX ) 0.4 MG CAPS capsule Take 0.8 mg by mouth at bedtime.   11   docusate sodium  (COLACE) 100 MG capsule Take 1 capsule (100 mg total) by mouth 2 (two) times daily. (Patient not taking: Reported on 04/09/2024) 60 capsule 0   ondansetron  (ZOFRAN ) 4 MG tablet Take 1 tablet (4 mg total) by mouth every 8 (eight) hours as needed for nausea or vomiting. (Patient not taking: Reported on 04/09/2024) 12 tablet 0   oxyCODONE  (ROXICODONE ) 5 MG immediate release tablet Take 1 tablet (5 mg total) by mouth every 6 (six) hours as needed for severe pain (pain score 7-10). (Patient not  taking: Reported on 04/09/2024) 8 tablet 0   No current facility-administered medications on file prior to visit.   "
